# Patient Record
Sex: Male | Born: 1960 | Race: White | Hispanic: No | Marital: Married | Smoking: Never smoker
Health system: Southern US, Community
[De-identification: ages and names within clinical notes are randomized; demographics above are authoritative.]

## PROBLEM LIST (undated history)

## (undated) DIAGNOSIS — Z923 Personal history of irradiation: Secondary | ICD-10-CM

## (undated) DIAGNOSIS — C801 Malignant (primary) neoplasm, unspecified: Secondary | ICD-10-CM

## (undated) HISTORY — PX: COLONOSCOPY: SHX174

## (undated) HISTORY — PX: LIPOMA EXCISION: SHX5283

---

## 2018-12-15 DIAGNOSIS — M791 Myalgia, unspecified site: Secondary | ICD-10-CM | POA: Diagnosis not present

## 2018-12-15 DIAGNOSIS — M531 Cervicobrachial syndrome: Secondary | ICD-10-CM | POA: Diagnosis not present

## 2018-12-15 DIAGNOSIS — M9901 Segmental and somatic dysfunction of cervical region: Secondary | ICD-10-CM | POA: Diagnosis not present

## 2018-12-15 DIAGNOSIS — M9902 Segmental and somatic dysfunction of thoracic region: Secondary | ICD-10-CM | POA: Diagnosis not present

## 2019-02-01 DIAGNOSIS — M9901 Segmental and somatic dysfunction of cervical region: Secondary | ICD-10-CM | POA: Diagnosis not present

## 2019-02-01 DIAGNOSIS — M791 Myalgia, unspecified site: Secondary | ICD-10-CM | POA: Diagnosis not present

## 2019-02-01 DIAGNOSIS — M531 Cervicobrachial syndrome: Secondary | ICD-10-CM | POA: Diagnosis not present

## 2019-02-01 DIAGNOSIS — M9902 Segmental and somatic dysfunction of thoracic region: Secondary | ICD-10-CM | POA: Diagnosis not present

## 2019-06-21 DIAGNOSIS — M791 Myalgia, unspecified site: Secondary | ICD-10-CM | POA: Diagnosis not present

## 2019-06-21 DIAGNOSIS — M9902 Segmental and somatic dysfunction of thoracic region: Secondary | ICD-10-CM | POA: Diagnosis not present

## 2019-06-21 DIAGNOSIS — M531 Cervicobrachial syndrome: Secondary | ICD-10-CM | POA: Diagnosis not present

## 2019-06-21 DIAGNOSIS — M9901 Segmental and somatic dysfunction of cervical region: Secondary | ICD-10-CM | POA: Diagnosis not present

## 2019-07-18 DIAGNOSIS — M531 Cervicobrachial syndrome: Secondary | ICD-10-CM | POA: Diagnosis not present

## 2019-07-18 DIAGNOSIS — M9901 Segmental and somatic dysfunction of cervical region: Secondary | ICD-10-CM | POA: Diagnosis not present

## 2019-07-18 DIAGNOSIS — M9902 Segmental and somatic dysfunction of thoracic region: Secondary | ICD-10-CM | POA: Diagnosis not present

## 2019-07-18 DIAGNOSIS — M791 Myalgia, unspecified site: Secondary | ICD-10-CM | POA: Diagnosis not present

## 2019-08-09 DIAGNOSIS — M531 Cervicobrachial syndrome: Secondary | ICD-10-CM | POA: Diagnosis not present

## 2019-08-09 DIAGNOSIS — M9902 Segmental and somatic dysfunction of thoracic region: Secondary | ICD-10-CM | POA: Diagnosis not present

## 2019-08-09 DIAGNOSIS — M9901 Segmental and somatic dysfunction of cervical region: Secondary | ICD-10-CM | POA: Diagnosis not present

## 2019-08-09 DIAGNOSIS — M791 Myalgia, unspecified site: Secondary | ICD-10-CM | POA: Diagnosis not present

## 2019-12-25 DIAGNOSIS — Z0000 Encounter for general adult medical examination without abnormal findings: Secondary | ICD-10-CM | POA: Diagnosis not present

## 2019-12-25 DIAGNOSIS — E7800 Pure hypercholesterolemia, unspecified: Secondary | ICD-10-CM | POA: Diagnosis not present

## 2019-12-25 DIAGNOSIS — Z125 Encounter for screening for malignant neoplasm of prostate: Secondary | ICD-10-CM | POA: Diagnosis not present

## 2019-12-25 DIAGNOSIS — Z6839 Body mass index (BMI) 39.0-39.9, adult: Secondary | ICD-10-CM | POA: Diagnosis not present

## 2020-01-02 DIAGNOSIS — Z23 Encounter for immunization: Secondary | ICD-10-CM | POA: Diagnosis not present

## 2020-03-03 DIAGNOSIS — Z23 Encounter for immunization: Secondary | ICD-10-CM | POA: Diagnosis not present

## 2020-06-01 DIAGNOSIS — Z23 Encounter for immunization: Secondary | ICD-10-CM | POA: Diagnosis not present

## 2020-06-23 DIAGNOSIS — Z20822 Contact with and (suspected) exposure to covid-19: Secondary | ICD-10-CM | POA: Diagnosis not present

## 2020-07-11 DIAGNOSIS — M791 Myalgia, unspecified site: Secondary | ICD-10-CM | POA: Diagnosis not present

## 2020-07-11 DIAGNOSIS — M9901 Segmental and somatic dysfunction of cervical region: Secondary | ICD-10-CM | POA: Diagnosis not present

## 2020-07-11 DIAGNOSIS — M531 Cervicobrachial syndrome: Secondary | ICD-10-CM | POA: Diagnosis not present

## 2020-07-11 DIAGNOSIS — M9902 Segmental and somatic dysfunction of thoracic region: Secondary | ICD-10-CM | POA: Diagnosis not present

## 2020-12-18 DIAGNOSIS — M791 Myalgia, unspecified site: Secondary | ICD-10-CM | POA: Diagnosis not present

## 2020-12-18 DIAGNOSIS — M531 Cervicobrachial syndrome: Secondary | ICD-10-CM | POA: Diagnosis not present

## 2020-12-18 DIAGNOSIS — M9902 Segmental and somatic dysfunction of thoracic region: Secondary | ICD-10-CM | POA: Diagnosis not present

## 2020-12-18 DIAGNOSIS — M9901 Segmental and somatic dysfunction of cervical region: Secondary | ICD-10-CM | POA: Diagnosis not present

## 2020-12-26 DIAGNOSIS — M531 Cervicobrachial syndrome: Secondary | ICD-10-CM | POA: Diagnosis not present

## 2020-12-26 DIAGNOSIS — M791 Myalgia, unspecified site: Secondary | ICD-10-CM | POA: Diagnosis not present

## 2020-12-26 DIAGNOSIS — M9902 Segmental and somatic dysfunction of thoracic region: Secondary | ICD-10-CM | POA: Diagnosis not present

## 2020-12-26 DIAGNOSIS — M9901 Segmental and somatic dysfunction of cervical region: Secondary | ICD-10-CM | POA: Diagnosis not present

## 2021-02-04 DIAGNOSIS — M9902 Segmental and somatic dysfunction of thoracic region: Secondary | ICD-10-CM | POA: Diagnosis not present

## 2021-02-04 DIAGNOSIS — M531 Cervicobrachial syndrome: Secondary | ICD-10-CM | POA: Diagnosis not present

## 2021-02-04 DIAGNOSIS — M9901 Segmental and somatic dysfunction of cervical region: Secondary | ICD-10-CM | POA: Diagnosis not present

## 2021-02-04 DIAGNOSIS — M791 Myalgia, unspecified site: Secondary | ICD-10-CM | POA: Diagnosis not present

## 2021-02-11 DIAGNOSIS — M791 Myalgia, unspecified site: Secondary | ICD-10-CM | POA: Diagnosis not present

## 2021-02-11 DIAGNOSIS — M531 Cervicobrachial syndrome: Secondary | ICD-10-CM | POA: Diagnosis not present

## 2021-02-11 DIAGNOSIS — M9902 Segmental and somatic dysfunction of thoracic region: Secondary | ICD-10-CM | POA: Diagnosis not present

## 2021-02-11 DIAGNOSIS — M9901 Segmental and somatic dysfunction of cervical region: Secondary | ICD-10-CM | POA: Diagnosis not present

## 2021-04-09 DIAGNOSIS — M791 Myalgia, unspecified site: Secondary | ICD-10-CM | POA: Diagnosis not present

## 2021-04-09 DIAGNOSIS — M9902 Segmental and somatic dysfunction of thoracic region: Secondary | ICD-10-CM | POA: Diagnosis not present

## 2021-04-09 DIAGNOSIS — M531 Cervicobrachial syndrome: Secondary | ICD-10-CM | POA: Diagnosis not present

## 2021-04-09 DIAGNOSIS — M9901 Segmental and somatic dysfunction of cervical region: Secondary | ICD-10-CM | POA: Diagnosis not present

## 2021-06-01 DIAGNOSIS — M531 Cervicobrachial syndrome: Secondary | ICD-10-CM | POA: Diagnosis not present

## 2021-06-01 DIAGNOSIS — M791 Myalgia, unspecified site: Secondary | ICD-10-CM | POA: Diagnosis not present

## 2021-06-01 DIAGNOSIS — M9902 Segmental and somatic dysfunction of thoracic region: Secondary | ICD-10-CM | POA: Diagnosis not present

## 2021-06-01 DIAGNOSIS — M9901 Segmental and somatic dysfunction of cervical region: Secondary | ICD-10-CM | POA: Diagnosis not present

## 2021-06-23 DIAGNOSIS — M791 Myalgia, unspecified site: Secondary | ICD-10-CM | POA: Diagnosis not present

## 2021-06-23 DIAGNOSIS — M9901 Segmental and somatic dysfunction of cervical region: Secondary | ICD-10-CM | POA: Diagnosis not present

## 2021-06-23 DIAGNOSIS — M9902 Segmental and somatic dysfunction of thoracic region: Secondary | ICD-10-CM | POA: Diagnosis not present

## 2021-06-23 DIAGNOSIS — M531 Cervicobrachial syndrome: Secondary | ICD-10-CM | POA: Diagnosis not present

## 2021-07-14 DIAGNOSIS — M791 Myalgia, unspecified site: Secondary | ICD-10-CM | POA: Diagnosis not present

## 2021-07-14 DIAGNOSIS — M9901 Segmental and somatic dysfunction of cervical region: Secondary | ICD-10-CM | POA: Diagnosis not present

## 2021-07-14 DIAGNOSIS — M531 Cervicobrachial syndrome: Secondary | ICD-10-CM | POA: Diagnosis not present

## 2021-07-14 DIAGNOSIS — M9902 Segmental and somatic dysfunction of thoracic region: Secondary | ICD-10-CM | POA: Diagnosis not present

## 2022-01-17 ENCOUNTER — Other Ambulatory Visit (HOSPITAL_COMMUNITY): Payer: Self-pay | Admitting: Family Medicine

## 2022-01-17 DIAGNOSIS — R1319 Other dysphagia: Secondary | ICD-10-CM

## 2022-01-27 ENCOUNTER — Ambulatory Visit (HOSPITAL_COMMUNITY)
Admission: RE | Admit: 2022-01-27 | Discharge: 2022-01-27 | Disposition: A | Discharge status: Home / Self Care | Payer: No Typology Code available for payment source | Source: Ambulatory Visit | Attending: Family Medicine | Admitting: Family Medicine

## 2022-01-27 DIAGNOSIS — R1319 Other dysphagia: Secondary | ICD-10-CM | POA: Diagnosis present

## 2022-02-02 ENCOUNTER — Other Ambulatory Visit (HOSPITAL_COMMUNITY): Payer: Self-pay | Admitting: Family Medicine

## 2022-02-02 ENCOUNTER — Other Ambulatory Visit: Payer: Self-pay | Admitting: Cardiology

## 2022-02-02 ENCOUNTER — Other Ambulatory Visit: Payer: Self-pay | Admitting: Family Medicine

## 2022-02-02 DIAGNOSIS — E0789 Other specified disorders of thyroid: Secondary | ICD-10-CM

## 2022-02-09 ENCOUNTER — Ambulatory Visit (HOSPITAL_COMMUNITY)
Admission: RE | Admit: 2022-02-09 | Discharge: 2022-02-09 | Disposition: A | Discharge status: Home / Self Care | Payer: No Typology Code available for payment source | Source: Ambulatory Visit | Attending: Family Medicine | Admitting: Family Medicine

## 2022-02-09 DIAGNOSIS — E0789 Other specified disorders of thyroid: Secondary | ICD-10-CM | POA: Diagnosis present

## 2022-02-10 ENCOUNTER — Telehealth (INDEPENDENT_AMBULATORY_CARE_PROVIDER_SITE_OTHER): Payer: Self-pay

## 2022-02-10 ENCOUNTER — Encounter (INDEPENDENT_AMBULATORY_CARE_PROVIDER_SITE_OTHER): Payer: Self-pay | Admitting: Gastroenterology

## 2022-02-10 ENCOUNTER — Encounter (INDEPENDENT_AMBULATORY_CARE_PROVIDER_SITE_OTHER): Payer: Self-pay

## 2022-02-10 ENCOUNTER — Ambulatory Visit (INDEPENDENT_AMBULATORY_CARE_PROVIDER_SITE_OTHER): Payer: No Typology Code available for payment source | Admitting: Gastroenterology

## 2022-02-10 ENCOUNTER — Other Ambulatory Visit (INDEPENDENT_AMBULATORY_CARE_PROVIDER_SITE_OTHER): Payer: Self-pay

## 2022-02-10 DIAGNOSIS — Z860100 Personal history of colon polyps, unspecified: Secondary | ICD-10-CM

## 2022-02-10 DIAGNOSIS — R1319 Other dysphagia: Secondary | ICD-10-CM | POA: Diagnosis not present

## 2022-02-10 DIAGNOSIS — R1310 Dysphagia, unspecified: Secondary | ICD-10-CM | POA: Insufficient documentation

## 2022-02-10 MED ORDER — PEG 3350-KCL-NA BICARB-NACL 420 G PO SOLR: 4000.0000 mL | ORAL | 0 refills | Status: DC

## 2022-02-10 NOTE — Patient Instructions (Signed)
Schedule EGD and colonoscopy

## 2022-02-10 NOTE — Telephone Encounter (Signed)
[name] [name], CMA  ?

## 2022-02-10 NOTE — Progress Notes (Signed)
Maylon Peppers, M.D. ?Gastroenterology & Hepatology ?[facility] ?[address] ?[place], [place] [address] ?Primary Care Physician: ?Masneri, Adele Barthel, DO ?[address] ?[place] [address] ? ?Referring MD: PCP ? ?Chief Complaint:  dysphagia ? ?History of Present Illness: ?[name] [name] is a 61 y.o. male with no past medical history, who presents for evaluation of dysphagia. ? ?States that for the last 10 weeks he has presented persistent and recurrent episodes of dysphagia to solids. States it happens more often with bread products. States that he has presented some pain in the upper thoracic midline area when he is swallowing which improves after he drinks water. He has had to vomit 4 times the food as the pain was significant, which actually immediately resolved the symptoms. No heartburn. ? ?The patient denies having any nausea, fever, chills, hematochezia, melena, hematemesis, abdominal distention, abdominal pain, diarrhea, jaundice, pruritus . Has lost 40 lb in last few months, which has been on purpose as he has been walking more frequently. ? 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  There was indentation of the intragastric area at the GE junction concerning for mass versus prominence of the GE junction.  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  Patient endoscopic submucosal dissection underwent thyroid ultrasound yesterday which showed a total of 6 nodules, 1 of which was recommended to have FNA for further evaluation. ? ?Last IWP:YKDXI ?Last Colonoscopy:3 years ago (performed at [place]), had one polyp removed at that time but no reports are available. Is due for a repeat colonoscopy per recs. ? ?FHx: neg for any gastrointestinal/liver disease, grandmother breast  cancer ?Social: neg smoking or illicit drug use. Has one bourbon or glass of wine a day. ?Surgical: no abdominal surgeries ? ?Past Medical History:History reviewed. No pertinent past medical history. ? ?Past Surgical History:History reviewed. No pertinent surgical history. ? ?Family History:History reviewed. No pertinent family history. ? ?Social History: ?Social History  ? ?Tobacco Use  ?Smoking Status Never  ?Smokeless Tobacco Never  ? ?Social History  ? ?Substance and Sexual Activity  ?Alcohol Use Yes  ? Comment: 1 daily  ? ?Social History  ? ?Substance and Sexual Activity  ?Drug Use Never  ? ? ?Allergies: ?No Known Allergies ? ?Medications: ?No current outpatient medications on file.  ? ?No current facility-administered medications for this visit.  ? ? ?Review of Systems: ?GENERAL: negative for malaise, night sweats ?HEENT: No changes in hearing or vision, no nose bleeds or other nasal problems. ?NECK: Negative for lumps, goiter, pain and significant neck swelling ?RESPIRATORY: Negative for cough, wheezing ?CARDIOVASCULAR: Negative for chest pain, leg swelling, palpitations, orthopnea ?GI: SEE HPI ?MUSCULOSKELETAL: Negative for joint pain or swelling, back pain, and muscle pain. ?SKIN: Negative for lesions, rash ?PSYCH: Negative for sleep disturbance, mood disorder and recent psychosocial stressors. ?HEMATOLOGY Negative for prolonged bleeding, bruising easily, and swollen nodes. ?ENDOCRINE: Negative for cold or heat intolerance, polyuria, polydipsia and goiter. ?NEURO: negative for tremor, gait imbalance, syncope and seizures. ?The remainder of the review of systems is noncontributory. ? ? ?Physical Exam: ?BP 115/76 (BP Location: Left Arm, Patient Position: Sitting, Cuff Size: Large)   Pulse 69   Temp 97.9 ?F (36.6 ?C) (Oral)   Ht '5\' 10"'$  (1.778 m)   Wt 238 lb 12.8  oz (108.3 kg)   BMI 34.26 kg/m?  ?GENERAL: The patient is AO x3, in no acute distress. ?HEENT: Head is normocephalic and atraumatic. EOMI are  intact. Mouth is well hydrated and without lesions. ?NECK: Supple. No masses ?LUNGS: Clear to auscultation. No presence of rhonchi/wheezing/rales. Adequate chest expansion ?HEART: RRR, normal s1 and s2. ?ABDOMEN: Soft, nontender, no guarding, no peritoneal signs, and nondistended. BS +. No masses. ?EXTREMITIES: Without any cyanosis, clubbing, rash, lesions or edema. ?NEUROLOGIC: AOx3, no focal motor deficit. ?SKIN: no jaundice, no rashes ? ? ?Imaging/Labs: ?as above ? ?I personally reviewed and interpreted the available labs, imaging and endoscopic files. ? ?Impression and Plan: ?[name] [name] is a 61 y.o. male with no past medical history, who presents for evaluation of dysphagia.  Patient has presented with symptoms while eating solids without presence of red flag signs.  However, he underwent recent esophagram that showed possible stricture at the GE junction and indentation t the GE junction.  We will need to further explore this with a evaluation depending on findings.  He is also due for colonoscopy as he has a history of colonic polyps which will be scheduled on the same day. ? ?- Schedule EGD and colonoscopy ? ?All questions were answered.     ? ?Maylon Peppers, MD ?Gastroenterology and Hepatology ?[facility] ? ?

## 2022-02-11 ENCOUNTER — Other Ambulatory Visit (HOSPITAL_COMMUNITY): Payer: Self-pay | Admitting: Family Medicine

## 2022-02-11 ENCOUNTER — Other Ambulatory Visit: Payer: Self-pay | Admitting: Family Medicine

## 2022-02-11 ENCOUNTER — Encounter (INDEPENDENT_AMBULATORY_CARE_PROVIDER_SITE_OTHER): Payer: Self-pay

## 2022-02-11 DIAGNOSIS — E041 Nontoxic single thyroid nodule: Secondary | ICD-10-CM

## 2022-02-17 ENCOUNTER — Encounter (HOSPITAL_COMMUNITY): Payer: Self-pay

## 2022-02-17 ENCOUNTER — Ambulatory Visit (HOSPITAL_COMMUNITY)
Admission: RE | Admit: 2022-02-17 | Discharge: 2022-02-17 | Disposition: A | Discharge status: Home / Self Care | Payer: No Typology Code available for payment source | Source: Ambulatory Visit | Attending: Family Medicine | Admitting: Family Medicine

## 2022-02-17 DIAGNOSIS — E041 Nontoxic single thyroid nodule: Secondary | ICD-10-CM | POA: Diagnosis not present

## 2022-02-17 MED ORDER — LIDOCAINE HCL (PF) 2 % IJ SOLN
INTRAMUSCULAR | Status: AC
  Administered 2022-02-17: 10 mL
  Filled 2022-02-17: qty 10

## 2022-02-17 MED ORDER — LIDOCAINE HCL (PF) 2 % IJ SOLN
INTRAMUSCULAR | Status: AC
  Filled 2022-02-17: qty 10

## 2022-02-17 NOTE — Progress Notes (Signed)
PT tolerated thyroid biopsy procedure well today. Labs and afirma obtained and sent for pathology. PT ambulatory at discharge with no acute distress noted and verbalized understanding of discharge instructions. 

## 2022-02-18 LAB — CYTOLOGY - NON PAP

## 2022-03-23 ENCOUNTER — Other Ambulatory Visit: Payer: Self-pay

## 2022-03-23 ENCOUNTER — Encounter (HOSPITAL_COMMUNITY)
Admission: RE | Admit: 2022-03-23 | Discharge: 2022-03-23 | Disposition: A | Discharge status: Home / Self Care | Payer: No Typology Code available for payment source | Source: Ambulatory Visit | Attending: Gastroenterology | Admitting: Gastroenterology

## 2022-03-23 ENCOUNTER — Encounter (HOSPITAL_COMMUNITY): Payer: Self-pay

## 2022-03-25 ENCOUNTER — Encounter (HOSPITAL_COMMUNITY): Payer: Self-pay | Admitting: Gastroenterology

## 2022-03-25 ENCOUNTER — Ambulatory Visit (HOSPITAL_COMMUNITY)
Admission: RE | Admit: 2022-03-25 | Discharge: 2022-03-25 | Disposition: A | Discharge status: Home / Self Care | Payer: No Typology Code available for payment source | Attending: Gastroenterology | Admitting: Gastroenterology

## 2022-03-25 ENCOUNTER — Ambulatory Visit (HOSPITAL_BASED_OUTPATIENT_CLINIC_OR_DEPARTMENT_OTHER): Payer: No Typology Code available for payment source | Admitting: Anesthesiology

## 2022-03-25 ENCOUNTER — Ambulatory Visit (HOSPITAL_COMMUNITY): Payer: No Typology Code available for payment source | Admitting: Anesthesiology

## 2022-03-25 ENCOUNTER — Encounter (HOSPITAL_COMMUNITY)
Admission: RE | Disposition: A | Discharge status: Home / Self Care | Payer: Self-pay | Source: Home / Self Care | Attending: Gastroenterology

## 2022-03-25 ENCOUNTER — Encounter (INDEPENDENT_AMBULATORY_CARE_PROVIDER_SITE_OTHER): Payer: Self-pay | Admitting: *Deleted

## 2022-03-25 DIAGNOSIS — Z8719 Personal history of other diseases of the digestive system: Secondary | ICD-10-CM | POA: Diagnosis not present

## 2022-03-25 DIAGNOSIS — K635 Polyp of colon: Secondary | ICD-10-CM | POA: Diagnosis not present

## 2022-03-25 DIAGNOSIS — Z1211 Encounter for screening for malignant neoplasm of colon: Secondary | ICD-10-CM | POA: Diagnosis not present

## 2022-03-25 DIAGNOSIS — D890 Polyclonal hypergammaglobulinemia: Secondary | ICD-10-CM

## 2022-03-25 DIAGNOSIS — D123 Benign neoplasm of transverse colon: Secondary | ICD-10-CM | POA: Insufficient documentation

## 2022-03-25 DIAGNOSIS — C169 Malignant neoplasm of stomach, unspecified: Secondary | ICD-10-CM | POA: Diagnosis not present

## 2022-03-25 DIAGNOSIS — Z86010 Personal history of colonic polyps: Secondary | ICD-10-CM

## 2022-03-25 DIAGNOSIS — R1310 Dysphagia, unspecified: Secondary | ICD-10-CM | POA: Insufficient documentation

## 2022-03-25 HISTORY — PX: COLONOSCOPY WITH PROPOFOL: SHX5780

## 2022-03-25 HISTORY — PX: ESOPHAGOGASTRODUODENOSCOPY (EGD) WITH PROPOFOL: SHX5813

## 2022-03-25 HISTORY — PX: BIOPSY: SHX5522

## 2022-03-25 LAB — HM COLONOSCOPY

## 2022-03-25 SURGERY — COLONOSCOPY WITH PROPOFOL
Anesthesia: General

## 2022-03-25 MED ORDER — LACTATED RINGERS IV SOLN: INTRAVENOUS | Status: DC

## 2022-03-25 MED ORDER — PROPOFOL 10 MG/ML IV BOLUS
INTRAVENOUS | Status: DC | PRN
  Administered 2022-03-25: 30 mg via INTRAVENOUS
  Administered 2022-03-25: 40 mg via INTRAVENOUS
  Administered 2022-03-25: 50 mg via INTRAVENOUS
  Administered 2022-03-25: 20 mg via INTRAVENOUS

## 2022-03-25 MED ORDER — OMEPRAZOLE 40 MG PO CPDR: 40.0000 mg | DELAYED_RELEASE_CAPSULE | Freq: Two times a day (BID) | ORAL | 3 refills | Status: DC

## 2022-03-25 MED ORDER — LIDOCAINE HCL (CARDIAC) PF 100 MG/5ML IV SOSY
PREFILLED_SYRINGE | INTRAVENOUS | Status: DC | PRN
  Administered 2022-03-25: 100 mg via INTRATRACHEAL

## 2022-03-25 MED ORDER — PROPOFOL 500 MG/50ML IV EMUL
INTRAVENOUS | Status: DC | PRN
  Administered 2022-03-25: 150 ug/kg/min via INTRAVENOUS
  Administered 2022-03-25: 125 ug/kg/min via INTRAVENOUS

## 2022-03-25 NOTE — Anesthesia Preprocedure Evaluation (Signed)
Anesthesia Evaluation  Patient identified by MRN, date of birth, ID band Patient awake    Reviewed: Allergy & Precautions, H&P , NPO status , Patient's Chart, lab work & pertinent test results, reviewed documented beta blocker date and time   Airway Mallampati: II  TM Distance: >3 FB Neck ROM: full    Dental no notable dental hx.    Pulmonary neg pulmonary ROS,    Pulmonary exam normal breath sounds clear to auscultation       Cardiovascular Exercise Tolerance: Good negative cardio ROS   Rhythm:regular Rate:Normal     Neuro/Psych negative neurological ROS  negative psych ROS   GI/Hepatic negative GI ROS, Neg liver ROS,   Endo/Other  negative endocrine ROS  Renal/GU negative Renal ROS  negative genitourinary   Musculoskeletal   Abdominal   Peds  Hematology negative hematology ROS (+)   Anesthesia Other Findings   Reproductive/Obstetrics negative OB ROS                             Anesthesia Physical Anesthesia Plan  ASA: 1  Anesthesia Plan: General   Post-op Pain Management:    Induction:   PONV Risk Score and Plan: Propofol infusion  Airway Management Planned:   Additional Equipment:   Intra-op Plan:   Post-operative Plan:   Informed Consent: I have reviewed the patients History and Physical, chart, labs and discussed the procedure including the risks, benefits and alternatives for the proposed anesthesia with the patient or authorized representative who has indicated his/her understanding and acceptance.     Dental Advisory Given  Plan Discussed with: CRNA  Anesthesia Plan Comments:         Anesthesia Quick Evaluation  

## 2022-03-25 NOTE — Transfer of Care (Signed)
Immediate Anesthesia Transfer of Care Note  Patient: [name] [name]  Procedure(s) Performed: COLONOSCOPY WITH PROPOFOL ESOPHAGOGASTRODUODENOSCOPY (EGD) WITH PROPOFOL BIOPSY  Patient Location: Short Stay  Anesthesia Type:General  Level of Consciousness: sedated  Airway & Oxygen Therapy: Patient Spontanous Breathing  Post-op Assessment: Report given to RN and Post -op Vital signs reviewed and stable  Post vital signs: Reviewed and stable  Last Vitals:  Vitals Value Taken Time  BP 93/55 03/25/22 1052  Temp 36.4 C 03/25/22 1052  Pulse 60 03/25/22 1052  Resp 14 03/25/22 1052  SpO2 98 % 03/25/22 1052    Last Pain:  Vitals:   03/25/22 1052  TempSrc: Axillary  PainSc:          Complications: No notable events documented.

## 2022-03-25 NOTE — Op Note (Addendum)
[facility] Patient Name: [name] [name] Procedure Date: 03/25/2022 9:38 AM MRN: [id number] Date of Birth: 13-Oct-1961 Attending MD: Maylon Peppers ,  CSN: [id number] Age: 61 Admit Type: Outpatient Procedure:                Upper GI endoscopy Indications:              Dysphagia Providers:                Maylon Peppers, Lurline Del, RN, Casimer Bilis, Technician Referring MD:              Medicines:                Monitored Anesthesia Care Complications:            No immediate complications. Estimated Blood Loss:     Estimated blood loss: none. Procedure:                Pre-Anesthesia Assessment:                           - Prior to the procedure, a History and Physical                            was performed, and patient medications, allergies                            and sensitivities were reviewed. The patient's                            tolerance of previous anesthesia was reviewed.                           - The risks and benefits of the procedure and the                            sedation options and risks were discussed with the                            patient. All questions were answered and informed                            consent was obtained.                           - ASA Grade Assessment: I - A normal, healthy                            patient.                           After obtaining informed consent, the endoscope was                            passed under direct vision. Throughout the  procedure, the patient's blood pressure, pulse, and                            oxygen saturations were monitored continuously. The                            [id number]-H190 ([id number]) scope was introduced through the                            mouth, and advanced to the second part of duodenum.                            The upper GI endoscopy was accomplished without                            difficulty. The  patient tolerated the procedure                            well. Scope In: 9:54:21 AM Scope Out: 10:08:37 AM Total Procedure Duration: 0 hours 14 minutes 16 seconds  Findings:      A large, ulcerating mass with no bleeding and stigmata of recent       bleeding was found at the gastroesophageal junction, 40 to 42 cm from       the incisors. The mass appeared to extend 1 cm distal towards the       cardia. The mass was partially obstructing and circumferential. This was       biopsied multiple times with a cold large-capacity forceps for histology.      The entire examined stomach was normal.      The examined duodenum was normal. 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 Biopsied.                           - Normal stomach.                           - Normal examined duodenum. Moderate Sedation:      Per Anesthesia Care Recommendation:           - Discharge patient to home (ambulatory).                           - Resume previous diet, but need to grind meat and                            will need to chew food thoroughly.                           - Use Prilosec (omeprazole) 40 mg  PO daily.                           - Await pathology results.                           - Obtain CT chest/abdomen/pelvis with IV contrast. Procedure Code(s):        --- Professional ---                           [phone], Esophagogastroduodenoscopy, flexible,                            transoral; with biopsy, single or multiple Diagnosis Code(s):        --- Professional ---                           D49.0, Neoplasm of unspecified behavior of                            digestive system                           R13.10, Dysphagia, unspecified CPT copyright 2019 American Medical Association. All rights reserved. The codes documented in this report are preliminary and upon  coder review may  be revised to meet current compliance requirements. Maylon Peppers, MD Maylon Peppers,  03/25/2022 10:16:07 AM This report has been signed electronically. Number of Addenda: 0

## 2022-03-25 NOTE — Discharge Instructions (Addendum)
You are being discharged to home.  Resume your previous diet, but need to grind meat and will need to chew food thoroughly. Take Prilosec (omeprazole) 40 mg by mouth once a day.  We are waiting for your pathology results.  Obtain CT chest/abdomen/pelvis with IV contrast. Your physician has recommended a repeat colonoscopy in five years for surveillance.

## 2022-03-25 NOTE — Op Note (Signed)
[facility] Patient Name: [name] [name] Procedure Date: 03/25/2022 9:37 AM MRN: [id number] Date of Birth: June 29, 1961 Attending MD: Maylon Peppers ,  CSN: [id number] Age: 61 Admit Type: Outpatient Procedure:                Colonoscopy Indications:              Surveillance: Personal history of colonic polyps                            (unknown histology) on last colonoscopy 3 years ago Providers:                Maylon Peppers, Lurline Del, RN, Casimer Bilis, Technician Referring MD:              Medicines:                Monitored Anesthesia Care Complications:            No immediate complications. Estimated Blood Loss:     Estimated blood loss: none. Procedure:                Pre-Anesthesia Assessment:                           - Prior to the procedure, a History and Physical                            was performed, and patient medications, allergies                            and sensitivities were reviewed. The patient's                            tolerance of previous anesthesia was reviewed.                           - The risks and benefits of the procedure and the                            sedation options and risks were discussed with the                            patient. All questions were answered and informed                            consent was obtained.                           - ASA Grade Assessment: I - A normal, healthy                            patient.                           After obtaining informed consent, the colonoscope  was passed under direct vision. Throughout the                            procedure, the patient's blood pressure, pulse, and                            oxygen saturations were monitored continuously. The                            PCF-HQ190L ([id number]) was introduced through the                            anus and advanced to the the cecum, identified by                             appendiceal orifice and ileocecal valve. The                            colonoscopy was performed without difficulty. The                            patient tolerated the procedure well. The quality                            of the bowel preparation was adequate. Scope In: 10:17:14 AM Scope Out: 10:44:09 AM Scope Withdrawal Time: 0 hours 22 minutes 19 seconds  Total Procedure Duration: 0 hours 26 minutes 55 seconds  Findings:      The perianal and digital rectal examinations were normal.      A 5 mm polyp was found in the transverse colon. The polyp was sessile.       The polyp was removed with a cold snare. Resection and retrieval were       complete.      The retroflexed view of the distal rectum and anal verge was normal and       showed no anal or rectal abnormalities. Impression:               - One 5 mm polyp in the transverse colon, removed                            with a cold snare. Resected and retrieved.                           - The distal rectum and anal verge are normal on                            retroflexion view. Moderate Sedation:      Per Anesthesia Care Recommendation:           - Discharge patient to home (ambulatory).                           - Resume previous diet.                           -  Await pathology results.                           - Repeat colonoscopy in 5 years for surveillance. Procedure Code(s):        --- Professional ---                           [phone], Colonoscopy, flexible; with removal of                            tumor(s), polyp(s), or other lesion(s) by snare                            technique Diagnosis Code(s):        --- Professional ---                           Z86.010, Personal history of colonic polyps                           K63.5, Polyp of colon CPT copyright 2019 American Medical Association. All rights reserved. The codes documented in this report are preliminary and upon coder review may  be revised to  meet current compliance requirements. Maylon Peppers, MD Maylon Peppers,  03/25/2022 10:52:16 AM This report has been signed electronically. Number of Addenda: 0

## 2022-03-25 NOTE — H&P (Signed)
[name] [name] is an 61 y.o. male.   Chief Complaint:  dysphagia and h/o colon polyps HPI: [name] [name] is a 61 y.o. male with no past medical history, who presents for evaluation of dysphagia and history of colon polyps.  Patient reports that he has presented persistent dysphagia since late February 2023.  States that his dysphagia is related to solids.  Has not lost any weight recently unintentionally.  No nausea, vomiting, fever, chills.  Last Colonoscopy:3 years ago (performed at [place]), had one polyp removed at that time but no reports are available. Is due for a repeat colonoscopy per recs.  History reviewed. No pertinent past medical history.  Past Surgical History:  Procedure Laterality Date   COLONOSCOPY      History reviewed. No pertinent family history. Social History:  reports that he has never smoked. He has never used smokeless tobacco. He reports current alcohol use. He reports that he does not use drugs.  Allergies: No Known Allergies  Medications Prior to Admission  Medication Sig Dispense Refill   polyethylene glycol-electrolytes (TRILYTE) 420 g solution Take 4,000 mLs by mouth as directed. 4000 mL 0    No results found for this or any previous visit (from the past 48 hour(s)). No results found.  Review of Systems  Constitutional: Negative.   HENT:  Positive for trouble swallowing.   Eyes: Negative.   Respiratory: Negative.    Cardiovascular: Negative.   Gastrointestinal: Negative.   Endocrine: Negative.   Genitourinary: Negative.   Musculoskeletal: Negative.   Skin: Negative.   Allergic/Immunologic: Negative.   Neurological: Negative.   Hematological: Negative.   Psychiatric/Behavioral: Negative.      Blood pressure 133/79, pulse 64, temperature 97.9 F (36.6 C), resp. rate 15, SpO2 96 %. Physical Exam  GENERAL: The patient is AO x3, in no acute distress. HEENT: Head is normocephalic and atraumatic. EOMI are intact. Mouth is well hydrated and  without lesions. NECK: Supple. No masses LUNGS: Clear to auscultation. No presence of rhonchi/wheezing/rales. Adequate chest expansion HEART: RRR, normal s1 and s2. ABDOMEN: Soft, nontender, no guarding, no peritoneal signs, and nondistended. BS +. No masses. EXTREMITIES: Without any cyanosis, clubbing, rash, lesions or edema. NEUROLOGIC: AOx3, no focal motor deficit. SKIN: no jaundice, no rashes  Assessment/Plan [name] is a 61 y.o. male with no past medical history, who presents for evaluation of dysphagia and history of colon polyps.  We will proceed with EGD and colonoscopy.  Harvel Quale, MD 03/25/2022, 9:14 AM

## 2022-03-25 NOTE — Anesthesia Postprocedure Evaluation (Signed)
Anesthesia Post Note  Patient: [name] [name]  Procedure(s) Performed: COLONOSCOPY WITH PROPOFOL ESOPHAGOGASTRODUODENOSCOPY (EGD) WITH PROPOFOL BIOPSY  Patient location during evaluation: Phase II Anesthesia Type: General Level of consciousness: awake Pain management: pain level controlled Vital Signs Assessment: post-procedure vital signs reviewed and stable Respiratory status: spontaneous breathing and respiratory function stable Cardiovascular status: blood pressure returned to baseline and stable Postop Assessment: no headache and no apparent nausea or vomiting Anesthetic complications: no Comments: Late entry   No notable events documented.   Last Vitals:  Vitals:   03/25/22 0832 03/25/22 1052  BP: 133/79 (!) 93/55  Pulse: 64 60  Resp: 15 14  Temp: 36.6 C (!) 36.4 C  SpO2: 96% 98%    Last Pain:  Vitals:   03/25/22 1056  TempSrc:   PainSc: 0-No pain                 Louann Sjogren

## 2022-03-28 ENCOUNTER — Other Ambulatory Visit (INDEPENDENT_AMBULATORY_CARE_PROVIDER_SITE_OTHER): Payer: Self-pay

## 2022-03-28 ENCOUNTER — Telehealth (INDEPENDENT_AMBULATORY_CARE_PROVIDER_SITE_OTHER): Payer: Self-pay

## 2022-03-28 DIAGNOSIS — K2289 Other specified disease of esophagus: Secondary | ICD-10-CM

## 2022-03-28 DIAGNOSIS — Z01812 Encounter for preprocedural laboratory examination: Secondary | ICD-10-CM

## 2022-03-28 LAB — SURGICAL PATHOLOGY

## 2022-03-28 NOTE — Telephone Encounter (Signed)
Ct Chest/Abd/Pel w/cm is scheduled on 03/30/22 and Mr Felter is aware

## 2022-03-28 NOTE — Telephone Encounter (Signed)
-----   Message from Harvel Quale, MD sent at 03/25/2022 10:15 AM EDT ----- [name] [name],   Can you please schedule a CT chest/abdomen/pelvis ASAP? Dx: esophageal mass, rule out cancer.   Thanks,  Maylon Peppers, MD Gastroenterology and Hepatology [facility]

## 2022-03-28 NOTE — Telephone Encounter (Signed)
Thanks

## 2022-03-29 ENCOUNTER — Ambulatory Visit (HOSPITAL_BASED_OUTPATIENT_CLINIC_OR_DEPARTMENT_OTHER): Payer: No Typology Code available for payment source

## 2022-03-30 ENCOUNTER — Other Ambulatory Visit (HOSPITAL_COMMUNITY): Payer: Self-pay

## 2022-03-30 ENCOUNTER — Ambulatory Visit (HOSPITAL_BASED_OUTPATIENT_CLINIC_OR_DEPARTMENT_OTHER)
Admission: RE | Admit: 2022-03-30 | Discharge: 2022-03-30 | Disposition: A | Discharge status: Home / Self Care | Payer: No Typology Code available for payment source | Source: Ambulatory Visit | Attending: Gastroenterology | Admitting: Gastroenterology

## 2022-03-30 ENCOUNTER — Encounter (HOSPITAL_BASED_OUTPATIENT_CLINIC_OR_DEPARTMENT_OTHER): Payer: Self-pay

## 2022-03-30 DIAGNOSIS — K760 Fatty (change of) liver, not elsewhere classified: Secondary | ICD-10-CM | POA: Diagnosis not present

## 2022-03-30 DIAGNOSIS — I700 Atherosclerosis of aorta: Secondary | ICD-10-CM | POA: Diagnosis not present

## 2022-03-30 DIAGNOSIS — M47894 Other spondylosis, thoracic region: Secondary | ICD-10-CM | POA: Diagnosis not present

## 2022-03-30 DIAGNOSIS — R911 Solitary pulmonary nodule: Secondary | ICD-10-CM | POA: Insufficient documentation

## 2022-03-30 DIAGNOSIS — C160 Malignant neoplasm of cardia: Secondary | ICD-10-CM

## 2022-03-30 DIAGNOSIS — K429 Umbilical hernia without obstruction or gangrene: Secondary | ICD-10-CM | POA: Insufficient documentation

## 2022-03-30 DIAGNOSIS — N400 Enlarged prostate without lower urinary tract symptoms: Secondary | ICD-10-CM | POA: Insufficient documentation

## 2022-03-30 DIAGNOSIS — K2289 Other specified disease of esophagus: Secondary | ICD-10-CM | POA: Insufficient documentation

## 2022-03-30 MED ORDER — IOHEXOL 300 MG/ML  SOLN
100.0000 mL | Freq: Once | INTRAMUSCULAR | Status: AC | PRN
  Administered 2022-03-30: 100 mL via INTRAVENOUS

## 2022-03-30 NOTE — Progress Notes (Signed)
Order for PET scan placed per Dr. Katragadda. 

## 2022-04-01 ENCOUNTER — Encounter (HOSPITAL_COMMUNITY): Payer: Self-pay | Admitting: Gastroenterology

## 2022-04-07 ENCOUNTER — Ambulatory Visit (HOSPITAL_COMMUNITY)
Admission: RE | Admit: 2022-04-07 | Discharge: 2022-04-07 | Disposition: A | Discharge status: Home / Self Care | Payer: No Typology Code available for payment source | Source: Ambulatory Visit | Attending: Hematology | Admitting: Hematology

## 2022-04-07 DIAGNOSIS — C160 Malignant neoplasm of cardia: Secondary | ICD-10-CM | POA: Diagnosis present

## 2022-04-07 MED ORDER — FLUDEOXYGLUCOSE F - 18 (FDG) INJECTION
10.8700 | Freq: Once | INTRAVENOUS | Status: AC | PRN
  Administered 2022-04-07: 10.87 via INTRAVENOUS

## 2022-04-12 ENCOUNTER — Telehealth: Payer: Self-pay | Admitting: Gastroenterology

## 2022-04-12 ENCOUNTER — Inpatient Hospital Stay (HOSPITAL_COMMUNITY): Payer: No Typology Code available for payment source | Attending: Hematology | Admitting: Hematology

## 2022-04-12 DIAGNOSIS — E041 Nontoxic single thyroid nodule: Secondary | ICD-10-CM | POA: Diagnosis not present

## 2022-04-12 DIAGNOSIS — Z803 Family history of malignant neoplasm of breast: Secondary | ICD-10-CM | POA: Diagnosis not present

## 2022-04-12 DIAGNOSIS — C160 Malignant neoplasm of cardia: Secondary | ICD-10-CM | POA: Insufficient documentation

## 2022-04-12 NOTE — Progress Notes (Signed)
[facility] 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 61 y.o. male is here because of evaluation for GE junction carcinoma, at the request of Dr. Levon Hedger.  He was having dysphagia symptoms since beginning of the year and was evaluated by Dr. Levon Hedger with EGD which showed GE junction mass.  Today he reports feeling good. He reports pain between his shoulder blades swallowing solid foods such as breads and broccoli since February. He reports he is intentionally losing weight through exercise; he walks 7 miles daily. He has lost 60 lbs since September 2022. He denies vomiting. His appetite is good, and he denies black stools and hematochezia. He denies history of acid reflux and Barrett's esophagus.   He currently lives at home with his wife. 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 He denies smoking history. He reports he drinks alcohol 5 days out of the week. His maternal grandmother had breast cancer, and his maternal cousin had metastatic breast cancer.   MEDICAL HISTORY:  No past medical history on file.  SURGICAL HISTORY: Past Surgical History:  Procedure Laterality Date   BIOPSY  03/25/2022   Procedure: BIOPSY;  Surgeon: Marguerita Merles, Reuel Boom, MD;  [facility]: [facility];  Service: Gastroenterology;;  GE junction;   COLONOSCOPY     COLONOSCOPY WITH PROPOFOL N/A 03/25/2022   Procedure: COLONOSCOPY WITH PROPOFOL;  Surgeon: Dolores Frame, MD;   Location: [facility];  Service: Gastroenterology;  Laterality: N/A;  945 ASA 1   ESOPHAGOGASTRODUODENOSCOPY (EGD) WITH PROPOFOL N/A 03/25/2022   Procedure: ESOPHAGOGASTRODUODENOSCOPY (EGD) WITH PROPOFOL;  Surgeon: Dolores Frame, MD;  Location: [facility];  Service: Gastroenterology;  Laterality: N/A;    SOCIAL HISTORY: Social History   Socioeconomic History   Marital status: Married    Spouse name: Not on file   Number of children: Not on file   Years of education: Not on file   Highest education level: Not on file  Occupational History   Not on file  Tobacco Use   Smoking status: Never   Smokeless tobacco: Never  Vaping Use   Vaping Use: Never used  Substance and Sexual Activity   Alcohol use: Yes    Comment: 1 daily   Drug use: Never   Sexual activity: Yes  Other Topics Concern   Not on file  Social History Narrative   Not on file   Social Determinants of Health   Financial Resource Strain: Not on file  Food Insecurity: Not on file  Transportation Needs: Not on file  Physical Activity: Not on file  Stress: Not on file  Social Connections: Not on file  Intimate Partner Violence: Not on file    FAMILY HISTORY: No family history on file.  ALLERGIES:  has No Known Allergies.  MEDICATIONS:  Current Outpatient Medications  Medication Sig Dispense Refill   omeprazole (PRILOSEC) 40 MG capsule Take 1 capsule (40 mg total) by mouth 2 (two) times daily. 180 capsule 3   No current facility-administered medications for this visit.  REVIEW OF SYSTEMS:   Review of Systems  Constitutional:  Negative for appetite change, fatigue and unexpected weight change.  HENT:   Positive for trouble swallowing.   Respiratory:  Positive for cough.   Gastrointestinal:  Negative for blood in stool and vomiting.  Musculoskeletal:  Positive for arthralgias (7/10 shoulder blades).  All other systems reviewed and are negative.    PHYSICAL EXAMINATION: ECOG  PERFORMANCE STATUS: 0 - Asymptomatic  Vitals:   04/12/22 0812  BP: 120/82  Pulse: 79  Resp: 18  Temp: (!) 97.5 F (36.4 C)  SpO2: 98%   Filed Weights   04/12/22 0812  Weight: 214 lb 4.8 oz (97.2 kg)   Physical Exam Vitals reviewed.  Constitutional:      Appearance: Normal appearance.  Cardiovascular:     Rate and Rhythm: Normal rate and regular rhythm.     Pulses: Normal pulses.     Heart sounds: Normal heart sounds.  Pulmonary:     Effort: Pulmonary effort is normal.     Breath sounds: Normal breath sounds.  Abdominal:     Palpations: Abdomen is soft. There is no mass.     Tenderness: There is no abdominal tenderness.  Musculoskeletal:     Right lower leg: No edema.     Left lower leg: No edema.  Lymphadenopathy:     Upper Body:     Right upper body: No supraclavicular adenopathy.     Left upper body: No supraclavicular adenopathy.     Lower Body: No right inguinal adenopathy. No left inguinal adenopathy.  Neurological:     General: No focal deficit present.     Mental Status: He is alert and oriented to person, place, and time.  Psychiatric:        Mood and Affect: Mood normal.        Behavior: Behavior normal.      LABORATORY DATA:  I have reviewed the data as listed     No data to display             No data to display          RADIOGRAPHIC STUDIES: I have personally reviewed the radiological images as listed and agreed with the findings in the report. NM PET Image Initial (PI) Skull Base To Thigh (F-18 FDG)  Result Date: 04/08/2022 CLINICAL DATA:  Initial treatment strategy for gastric cancer. EXAM: NUCLEAR MEDICINE PET SKULL BASE TO THIGH TECHNIQUE: 10.87 mCi F-18 FDG was injected intravenously. Full-ring PET imaging was performed from the skull base to thigh after the radiotracer. CT data was obtained and used for attenuation correction and anatomic localization. Fasting blood glucose: 96 mg/dl COMPARISON:  CT of the chest abdomen pelvis from  March 30, 2022. FINDINGS: Mediastinal blood pool activity: SUV max 2.70 Liver activity: SUV max NA NECK: No hypermetabolic lymph nodes in the neck. Focal area of increased metabolic activity along the LEFT superior thyroid lobe 4.07 maximum SUV. Small nodule in this area on prior CT measuring 9 mm (image 64/3) calcified nodule on the RIGHT without increased metabolic activity Incidental CT findings: None. CHEST: Hypermetabolic area at the GE junction (image 131/3) no discrete mass lesion, signs of thickening in this area with maximum SUV 6.93. No signs of adenopathy or suspicious pulmonary nodule. Incidental CT findings: Calcified aortic atherosclerosis is mild. Heart size mildly enlarged without pericardial effusion or nodularity. No adenopathy by size criteria in the chest. Lungs are clear. Airways are patent. ABDOMEN/PELVIS: No abnormal hypermetabolic activity within  the liver, pancreas, adrenal glands, or spleen. No hypermetabolic lymph nodes in the abdomen or pelvis. Incidental CT findings: No adenopathy by size criteria in the abdomen. No acute findings relative to liver, gallbladder, pancreas, spleen, adrenal glands or kidneys. Urinary bladder decompressed without signs of stranding or gross thickening. No acute gastrointestinal process. Appendix is normal. SKELETON: No focal hypermetabolic activity to suggest skeletal metastasis. Incidental CT findings: none IMPRESSION: Increased metabolic activity about the GE junction likely the site of the patient's primary tumor. No signs of metastatic disease to the neck, chest, abdomen or pelvis. LEFT thyroid lesion with moderately increased metabolic activity. Recommend thyroid US and biopsy (ref: J Am Coll Radiol. 2015 Feb;12(2): 143-50). Electronically Signed   By: Donzetta Kohut M.D.   On: 04/08/2022 12:09   CT CHEST ABDOMEN PELVIS W CONTRAST  Result Date: 04/01/2022 CLINICAL DATA:  Esophageal cancer, staging CT. Esophageal mass recently found from endoscopy.  Began experiencing difficulty swallowing 2 months ago than associated with pain. EXAM: CT CHEST, ABDOMEN, AND PELVIS WITH CONTRAST TECHNIQUE: Multidetector CT imaging of the chest, abdomen and pelvis was performed following the standard protocol during bolus administration of intravenous contrast. RADIATION DOSE REDUCTION: This exam was performed according to the departmental dose-optimization program which includes automated exposure control, adjustment of the mA and/or kV according to patient size and/or use of iterative reconstruction technique. CONTRAST:  OMNIPAQUE IOHEXOL 300 MG/ML  SOLN COMPARISON:  Esophagram images dated 01/27/2022 showing a GE junction low-grade stricture obstructing 12.5 mm barium tablet. There is no prior CT for comparison. FINDINGS: CT CHEST FINDINGS Cardiovascular: The cardiac size is normal. There is trace calcification in the proximal LAD coronary artery. There is no pericardial effusion or venous distention. Pulmonary arteries are normal caliber and centrally clear. There is homogeneous aortic and great vessel enhancement with mild atherosclerosis in the aortic arch. Mediastinum/Nodes: 2.4 cm densely rim calcified nodule lower pole right lobe of the thyroid gland, underwent FNA biopsy 02/17/2022 with unknown results. This was performed following a thyroid ultrasound 02/09/2022. There are no enlarged intrathoracic or axillary lymph nodes. There are thickened folds at the EG junction but no focal circumscribable mass. PET-CT may be helpful to further evaluate the finding. The trachea and central airways are clear. Lungs/Pleura: No pleural effusion, thickening or pneumothorax. There are few linear scar-like opacities in the lung bases. There is no infiltrate or spiculated nodule. Posteriorly in the right upper lobe at the level of the aortic arch there is a 5 mm noncalcified pleural-based nodule on 4:41. The lungs are otherwise clear. Musculoskeletal: There is a mild thoracic  kyphodextroscoliosis and multilevel mild wedging in the mid to lower thoracic spine with a chronic appearance. There is spondylosis. No worrisome regional bone lesion. CT ABDOMEN PELVIS FINDINGS Hepatobiliary: The liver is mildly steatotic. There is no mass enhancement. The gallbladder and bile ducts are unremarkable. Pancreas: No focal abnormality or ductal dilatation. Spleen: No focal abnormality or splenomegaly. Adrenals/Urinary Tract: There is no adrenal or renal mass enhancement. There is a subcentimeter too small to characterize hypodensity in the inferior pole of the left kidney which is probably a cyst. There is no urinary stone or obstruction. The bladder is contracted and not well seen although could be thickened. Stomach/Bowel: The gastric wall unremarkable below the EG junction. The bowel demonstrates no wall thickening or dilatation. The appendix is normal. There is moderate stool retention ascending and transverse colon. Left-sided diverticula without diverticulitis. Vascular/Lymphatic: No significant vascular findings are present. No enlarged abdominal or  pelvic lymph nodes. Reproductive: Mildly enlarged prostate with impression on the inferior bladder. Other: Multiple pelvic phleboliths. Small umbilical fat hernia. There is no incarcerated hernia. There is no free air, hemorrhage or fluid. Musculoskeletal: There are degenerative changes of the lumbar spine. This includes with advanced disc collapse at L5-S1 foraminal encroachment. There is no regional destructive bone lesion or other suspicious findings. IMPRESSION: 1. There are thickened folds of the EG junction without discrete focal mass visible. PET-CT may be helpful for further evaluation. 2. There is no upstream esophageal dilatation or thickening. 3. No intrathoracic axillary or abdominal adenopathy. 4. 5 mm right upper lobe solitary nodule. Attention on oncologic follow-up imaging recommended. 5. 2.4 cm densely rim calcified right thyroid  nodule, recently underwent FNA biopsy with unknown results. 6. Mild hepatic steatosis. 7. Constipation and diverticulosis. 8. Aortic and coronary artery atherosclerosis. 9. Solitary subcentimeter too small to characterize left renal hypodensity. Probable cyst. 10. Prostatomegaly with bladder thickening which could be due to nondistention, cystitis or hypertrophy. 11. Remaining findings described above. Electronically Signed   By: Almira Bar M.D.   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  He lost about 60 pounds since September, was trying to lose weight by walking 7 miles a day. - EGD (03/25/2022): Large ulcerating mass with no bleeding found at the GE junction, 40-42 cm from incisors.  Mass appeared to extend 1 cm distal towards the cardia.  Partially obstructing and circumferential.  Normal stomach and duodenum. 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 - CT CAP (03/30/2022): Thickened folds of the GE junction without discrete focal mass.  No upstream dilatation.  No intrathoracic, axillary or abdominal adenopathy.  5 mm right upper lobe solitary nodule.  2.4 cm densely calcified right thyroid nodule.  Mild hepatic steatosis. - PET CT scan (04/07/2022): Hypermetabolic area at the GE junction SUV 6.93.  No signs of adenopathy or suspicious lung nodules.  No sign of other metastatic disease.  Left thyroid lesion with moderately increased metabolic activity, SUV 4.07   Social/family history: - Lives at home with his wife.  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  Non-smoker.  Drinks alcohol 5 days/week, moderate. - Maternal grandmother had breast cancer.  Maternal cousin had metastatic breast cancer.    PLAN:  GE Junction adenocarcinoma: - We have reviewed imaging studies and pathology  reports in detail. - Recommend endoscopic ultrasound for T staging and N staging.  We will make a referral to Dr. Evalee Jefferson. - Recommend evaluation by Dr. Cliffton Asters. - Also recommend dietary evaluation. - Discussed possibility of chemoradiation followed by surgery.  2.  Left thyroid nodule: - PET scan showed incidental left thyroid nodule. - He had a biopsy of the right thyroid nodule on 02/17/2022, scant follicular epithelium, Bethesda category 1. - We will work-up the left thyroid nodule after we address the GE junction adenocarcinoma.   All questions were answered. The patient knows to call the clinic with any problems, questions or concerns.   Doreatha Massed, MD, 04/12/22 12:10 PM  [facility] [phone]   I, Alda Ponder, am acting as a scribe for Dr. Doreatha Massed.  I, Doreatha Massed MD, have reviewed the above documentation for accuracy and completeness, and I agree with the above.

## 2022-04-13 ENCOUNTER — Other Ambulatory Visit: Payer: Self-pay

## 2022-04-13 DIAGNOSIS — C160 Malignant neoplasm of cardia: Secondary | ICD-10-CM

## 2022-04-13 NOTE — Progress Notes (Signed)
Patient notified that he has been scheduled for an Upper EUS with Dr Rush Landmark on 04-25-22 at 1:45 at [facility].   Instructions provided for patient by phone, and sent to him in [place] for his review.  Patient did have questions regarding transportation. Patient was provided with information to [facility].  Phone number [phone] was given to the patient.  Patient agreed to plan and verbalized understanding.  No further questions.   ----- Message -----  From: Brien Mates, RN  Sent: 04/13/2022   8:43 AM EDT  To: Algernon Huxley, RN   Christie Beckers,   Dr. Raliegh Ip and I met with the patient yesterday prior to this message thread. I have not updated the patient on this conversation at all, but if you wouldn't mind calling the patient. Is it possible to offer him the July 10th date as offered by Dr. Rush Landmark?   If there are any cancellations or sudden openings sooner, I feel confident that the patient would be accepting of that. They are just looking to expedite the process in any way.   Thank you!  Lilia Pro  [phone]  ----- Message -----  From: Algernon Huxley, RN  Sent: 04/12/2022   4:53 PM EDT  To: Milus Banister, MD; Derek Jack, MD; *   Dr. Ardis Hughs is anyone in particular working on this for you. Just want to make sure we all aren't trying to do this.   ----- Message -----  From: Milus Banister, MD  Sent: 04/12/2022   4:04 PM EDT  To: Derek Jack, MD; Algernon Huxley, RN; *   I actually heard from Dr. Delton Coombes earlier today about this many and we are already working on an appt, have offered Thursday July 20th I believe. That is my first opening.   Thanks    ----- Message -----  From: Brien Mates, RN  Sent: 04/12/2022   3:54 PM EDT  To: Milus Banister, MD; Derek Jack, MD; *   Please see the below message from Dr. Delton Coombes regarding patient:   I have seen this gentleman with newly diagnosed poorly differentiated GE junction  adenocarcinoma, what appears to be T2/3 N0 M0 based on PET scan.  He is otherwise in excellent shape.  Could you or one of your colleagues please get him in for EUS with or without biopsy?  He is also scheduled to meet with Dr. Kipp Brood.  I appreciate your help.  [place]    Please know that the patient's wife will be going out of town soon and will do any days/time to do the EUS as soon as possible. They are requesting this week but we understand that is a big ask.   Thank you!  Lilia Pro

## 2022-04-13 NOTE — Addendum Note (Signed)
Addended by: Stevan Born on: 04/13/2022 10:44 AM   Modules accepted: Orders

## 2022-04-25 ENCOUNTER — Ambulatory Visit (HOSPITAL_BASED_OUTPATIENT_CLINIC_OR_DEPARTMENT_OTHER): Payer: No Typology Code available for payment source | Admitting: Anesthesiology

## 2022-04-25 ENCOUNTER — Other Ambulatory Visit: Payer: Self-pay

## 2022-04-25 ENCOUNTER — Encounter (HOSPITAL_COMMUNITY): Payer: Self-pay | Admitting: Gastroenterology

## 2022-04-25 ENCOUNTER — Encounter (HOSPITAL_COMMUNITY)
Admission: RE | Disposition: A | Discharge status: Home / Self Care | Payer: Self-pay | Source: Home / Self Care | Attending: Gastroenterology

## 2022-04-25 ENCOUNTER — Inpatient Hospital Stay (HOSPITAL_COMMUNITY): Payer: No Typology Code available for payment source | Admitting: Dietician

## 2022-04-25 ENCOUNTER — Telehealth (HOSPITAL_COMMUNITY): Payer: Self-pay | Admitting: Dietician

## 2022-04-25 ENCOUNTER — Ambulatory Visit (HOSPITAL_COMMUNITY)
Admission: RE | Admit: 2022-04-25 | Discharge: 2022-04-25 | Disposition: A | Discharge status: Home / Self Care | Payer: No Typology Code available for payment source | Attending: Gastroenterology | Admitting: Gastroenterology

## 2022-04-25 ENCOUNTER — Ambulatory Visit (HOSPITAL_COMMUNITY): Payer: No Typology Code available for payment source | Admitting: Anesthesiology

## 2022-04-25 DIAGNOSIS — C160 Malignant neoplasm of cardia: Secondary | ICD-10-CM

## 2022-04-25 DIAGNOSIS — C159 Malignant neoplasm of esophagus, unspecified: Secondary | ICD-10-CM | POA: Diagnosis not present

## 2022-04-25 DIAGNOSIS — Z5111 Encounter for antineoplastic chemotherapy: Secondary | ICD-10-CM | POA: Insufficient documentation

## 2022-04-25 DIAGNOSIS — E042 Nontoxic multinodular goiter: Secondary | ICD-10-CM | POA: Insufficient documentation

## 2022-04-25 DIAGNOSIS — K2970 Gastritis, unspecified, without bleeding: Secondary | ICD-10-CM | POA: Insufficient documentation

## 2022-04-25 DIAGNOSIS — R1310 Dysphagia, unspecified: Secondary | ICD-10-CM | POA: Diagnosis not present

## 2022-04-25 DIAGNOSIS — K760 Fatty (change of) liver, not elsewhere classified: Secondary | ICD-10-CM | POA: Insufficient documentation

## 2022-04-25 DIAGNOSIS — Z803 Family history of malignant neoplasm of breast: Secondary | ICD-10-CM | POA: Insufficient documentation

## 2022-04-25 DIAGNOSIS — R911 Solitary pulmonary nodule: Secondary | ICD-10-CM | POA: Insufficient documentation

## 2022-04-25 HISTORY — PX: ESOPHAGOGASTRODUODENOSCOPY (EGD) WITH PROPOFOL: SHX5813

## 2022-04-25 HISTORY — PX: UPPER ESOPHAGEAL ENDOSCOPIC ULTRASOUND (EUS): SHX6562

## 2022-04-25 HISTORY — PX: BIOPSY: SHX5522

## 2022-04-25 SURGERY — ESOPHAGOGASTRODUODENOSCOPY (EGD) WITH PROPOFOL
Anesthesia: Monitor Anesthesia Care

## 2022-04-25 MED ORDER — SODIUM CHLORIDE 0.9 % IV SOLN: INTRAVENOUS | Status: DC

## 2022-04-25 MED ORDER — LACTATED RINGERS IV SOLN: INTRAVENOUS | Status: DC

## 2022-04-25 MED ORDER — MIDAZOLAM HCL 2 MG/2ML IJ SOLN
INTRAMUSCULAR | Status: AC
  Filled 2022-04-25: qty 2

## 2022-04-25 MED ORDER — MIDAZOLAM HCL 5 MG/5ML IJ SOLN
INTRAMUSCULAR | Status: DC | PRN
  Administered 2022-04-25: 2 mg via INTRAVENOUS

## 2022-04-25 MED ORDER — PROPOFOL 500 MG/50ML IV EMUL
INTRAVENOUS | Status: DC | PRN
  Administered 2022-04-25: 150 ug/kg/min via INTRAVENOUS

## 2022-04-25 NOTE — Transfer of Care (Signed)
Immediate Anesthesia Transfer of Care Note  Patient: [name] [name]  Procedure(s) Performed: UPPER ESOPHAGEAL ENDOSCOPIC ULTRASOUND (EUS) BIOPSY ESOPHAGOGASTRODUODENOSCOPY (EGD) WITH PROPOFOL  Patient Location: PACU  Anesthesia Type:MAC  Level of Consciousness: awake, alert  and oriented  Airway & Oxygen Therapy: Patient Spontanous Breathing and Patient connected to face mask oxygen  Post-op Assessment: Report given to RN and Post -op Vital signs reviewed and stable  Post vital signs: Reviewed and stable  Last Vitals:  Vitals Value Taken Time  BP 122/63 04/25/22 1640  Temp 36.6 C 04/25/22 1637  Pulse 86 04/25/22 1649  Resp 19 04/25/22 1649  SpO2 100 % 04/25/22 1649  Vitals shown include unvalidated device data.  Last Pain:  Vitals:   04/25/22 1637  TempSrc: Temporal  PainSc: Asleep         Complications: No notable events documented.

## 2022-04-25 NOTE — Anesthesia Preprocedure Evaluation (Addendum)
Anesthesia Evaluation  Patient identified by MRN, date of birth, ID band Patient awake    Reviewed: Allergy & Precautions, NPO status , Patient's Chart, lab work & pertinent test results  Airway Mallampati: II  TM Distance: >3 FB Neck ROM: Full    Dental no notable dental hx. (+) Teeth Intact, Dental Advisory Given   Pulmonary neg pulmonary ROS,    Pulmonary exam normal breath sounds clear to auscultation       Cardiovascular negative cardio ROS Normal cardiovascular exam Rhythm:Regular Rate:Normal     Neuro/Psych negative neurological ROS  negative psych ROS   GI/Hepatic negative GI ROS, Neg liver ROS,   Endo/Other  negative endocrine ROS  Renal/GU negative Renal ROS  negative genitourinary   Musculoskeletal negative musculoskeletal ROS (+)   Abdominal   Peds  Hematology negative hematology ROS (+)   Anesthesia Other Findings GE junction carcinoma  Reproductive/Obstetrics                            Anesthesia Physical Anesthesia Plan  ASA: 2  Anesthesia Plan: MAC   Post-op Pain Management:    Induction: Intravenous  PONV Risk Score and Plan: Propofol infusion and Treatment may vary due to age or medical condition  Airway Management Planned: Natural Airway  Additional Equipment:   Intra-op Plan:   Post-operative Plan:   Informed Consent: I have reviewed the patients History and Physical, chart, labs and discussed the procedure including the risks, benefits and alternatives for the proposed anesthesia with the patient or authorized representative who has indicated his/her understanding and acceptance.     Dental advisory given  Plan Discussed with: CRNA  Anesthesia Plan Comments:         Anesthesia Quick Evaluation

## 2022-04-25 NOTE — Discharge Instructions (Signed)
YOU HAD AN ENDOSCOPIC PROCEDURE TODAY: Refer to the procedure report and other information in the discharge instructions given to you for any specific questions about what was found during the examination. If this information does not answer your questions, please call [place] office at [phone] to clarify.   YOU SHOULD EXPECT: Some feelings of bloating in the abdomen. Passage of more gas than usual. Walking can help get rid of the air that was put into your GI tract during the procedure and reduce the bloating. If you had a lower endoscopy (such as a colonoscopy or flexible sigmoidoscopy) you may notice spotting of blood in your stool or on the toilet paper. Some abdominal soreness may be present for a day or two, also.  DIET: Your first meal following the procedure should be a light meal and then it is ok to progress to your normal diet. A half-sandwich or bowl of soup is an example of a good first meal. Heavy or fried foods are harder to digest and may make you feel nauseous or bloated. Drink plenty of fluids but you should avoid alcoholic beverages for 24 hours. If you had a esophageal dilation, please see attached instructions for diet.    ACTIVITY: Your care partner should take you home directly after the procedure. You should plan to take it easy, moving slowly for the rest of the day. You can resume normal activity the day after the procedure however YOU SHOULD NOT DRIVE, use power tools, machinery or perform tasks that involve climbing or major physical exertion for 24 hours (because of the sedation medicines used during the test).   SYMPTOMS TO REPORT IMMEDIATELY: A gastroenterologist can be reached at any hour. Please call [phone]  for any of the following symptoms:   Following upper endoscopy (EGD, EUS, ERCP, esophageal dilation) Vomiting of blood or coffee ground material  New, significant abdominal pain  New, significant chest pain or pain under the shoulder blades  Painful or  persistently difficult swallowing  New shortness of breath  Black, tarry-looking or red, bloody stools  FOLLOW UP:  If any biopsies were taken you will be contacted by phone or by letter within the next 1-3 weeks. Call [phone]  if you have not heard about the biopsies in 3 weeks.  Please also call with any specific questions about appointments or follow up tests.  

## 2022-04-25 NOTE — Op Note (Signed)
[facility] Patient Name: [name] [name] Procedure Date: 04/25/2022 MRN: [id number] Attending MD: Justice Britain , MD Date of Birth: 1961/01/04 CSN: [id number] Age: 61 Admit Type: Outpatient Procedure:                Upper EUS Indications:              Pre-treatment staging of esophageal adenocarcinoma,                            Dysphagia Providers:                Justice Britain, MD, Carlyn Reichert, RN, William Dalton, Technician Referring MD:             Maylon Peppers, Adele Barthel. Masneri Medicines:                Monitored Anesthesia Care Complications:            No immediate complications. Estimated Blood Loss:     Estimated blood loss was minimal. Procedure:                Pre-Anesthesia Assessment:                           - Prior to the procedure, a History and Physical                            was performed, and patient medications and                            allergies were reviewed. The patient's tolerance of                            previous anesthesia was also reviewed. The risks                            and benefits of the procedure and the sedation                            options and risks were discussed with the patient.                            All questions were answered, and informed consent                            was obtained. Prior Anticoagulants: The patient has                            taken no previous anticoagulant or antiplatelet                            agents. ASA Grade Assessment: II - A patient with  mild systemic disease. After reviewing the risks                            and benefits, the patient was deemed in                            satisfactory condition to undergo the procedure.                           After obtaining informed consent, the endoscope was                            passed under direct vision. Throughout the                             procedure, the patient's blood pressure, pulse, and                            oxygen saturations were monitored continuously. The                            [id number]-[id number] ([id number]) Olympus endoscope was introduced                            through the mouth, and advanced to the second part                            of duodenum. The [id number]-[id number] ([id number]) Olympus                            radial ultrasound scope was introduced through the                            mouth, and advanced to the esophagus for ultrasound                            examination. The [id number] ([id number]) Olympus was                            introduced through the mouth, and advanced to the                            stomach for ultrasound examination from the                            esophagus and stomach. The upper EUS was                            accomplished without difficulty. The patient                            tolerated the procedure. Scope In: Scope Out: Findings:      ENDOSCOPIC FINDING: :      No gross lesions were noted in  the proximal esophagus and in the mid       esophagus.      A medium-sized, fungating and ulcerating mass with bleeding and stigmata       of recent bleeding was found in the distal esophagus extending into the       gastroesophageal junction and into the cardia, 37 to 40 cm from the       incisors was the mass in regards to the esophagus and extended to 42 cm       within the gastric cardia region. The mass was partially obstructing and       circumferential. I had to use gentle pressure to even allow the adult       endoscope (9.8 mm) to traverse this region.      Patchy mild inflammation characterized by erosions, friability and       granularity was found in the entire examined stomach. Biopsies were       taken with a cold forceps for histology and Helicobacter pylori testing.      No gross lesions were noted in the duodenal bulb, in the first portion       of the  duodenum and in the second portion of the duodenum.      ENDOSONOGRAPHIC FINDING (We had to transition to the Miniprobe to allow       for EUS) :      A hypoechoic mass was found at the gastroesophageal junction extending       into the cardia. The mass was encountered at 37 cm from the incisors and       extended to 42 cm (The esophageal portion is 37-40 cm). The lesion was       circumferential. The endosonographic borders were irregular. There was       sonographic evidence suggesting invasion into and through the muscularis       propria (Layer 4). An intact interface was seen between the mass and the       adjacent structures suggesting a lack of invasion further.      Endosonographic imaging in the thoracic esophagus showed no wall       thickening.      No malignant-appearing lymph nodes were visualized in the middle       paraesophageal mediastinum (level 24M) and lower paraesophageal       mediastinum (level 8L). I could not visualize the GHL, Celiac regions as       a result of mini-probe use so subtle LNs could have been missed (but       PET-CT was negative). Impression:               EGD Impression:                           - No gross lesions in esophagus proximally.                           - Partially obstructing (stenosed to approximately                            9 mm - had to use gentle pressure to allow scope to                            traverse this  region (9.8 mm)), malignant                            esophageal tumor was found at the distal esophagus                            extending into the gastroesophageal junction and                            into the cardia.                           - Gastritis. Biopsied.                           - No gross lesions in the duodenal bulb, in the                            first portion of the duodenum and in the second                            portion of the duodenum.                           EUS Impression:                            - A mass was found at the distal esophagus                            extending into the GE Jxn and the cardia. A tissue                            diagnosis was obtained prior to this exam which is                            consistent with adenocarcinoma. The lesion extends                            through the MP. This was staged T3 N0 Mx by                            endosonographic criteria.                           - No malignant-appearing lymph nodes were                            visualized in the middle paraesophageal mediastinum                            (level 45M) and lower paraesophageal mediastinum                            (level 8L). Moderate Sedation:  Not Applicable - Patient had care per Anesthesia. Recommendation:           - The patient will be observed post-procedure,                            until all discharge criteria are met.                           - Discharge patient to home.                           - Patient has a contact number available for                            emergencies. The signs and symptoms of potential                            delayed complications were discussed with the                            patient. Return to normal activities tomorrow.                            Written discharge instructions were provided to the                            patient.                           - Mechanical soft diet.                           - Observe patient's clinical course.                           - Continue present medications.                           - Follow up with Thoracic Surgery and with Oncology                            for next steps in evaluation and treatment.                           - The findings and recommendations were discussed                            with the patient. Procedure Code(s):        --- Professional ---                           [id number], Esophagogastroduodenoscopy, flexible,                             transoral; with endoscopic ultrasound examination  limited to the esophagus, stomach or duodenum, and                            adjacent structures                           43239, Esophagogastroduodenoscopy, flexible,                            transoral; with biopsy, single or multiple Diagnosis Code(s):        --- Professional ---                           C16.0, Malignant neoplasm of cardia                           K29.70, Gastritis, unspecified, without bleeding                           K22.8, Other specified diseases of esophagus                           I89.9, Noninfective disorder of lymphatic vessels                            and lymph nodes, unspecified                           C15.9, Malignant neoplasm of esophagus, unspecified                           R13.10, Dysphagia, unspecified CPT copyright 2019 American Medical Association. All rights reserved. The codes documented in this report are preliminary and upon coder review may  be revised to meet current compliance requirements. Justice Britain, MD 04/25/2022 4:47:08 PM Number of Addenda: 0

## 2022-04-25 NOTE — Telephone Encounter (Signed)
Nutrition Assessment   Reason for Assessment: dysphagia    ASSESSMENT: 61 year old male with newly diagnosed cancer of GE junction. Treatment plan is currently under work-up. EUS scheduled today (7/10) for staging.   Spoke with patient via telephone. He reports 2-3 months of dysphagia with breads, roasted broccoli, and tortilla chips. Patient reports small bites and is chewing foods well before swallowing. He endorses intentional weight loss since beginning exercise regimen on 07/03/21. Patient reports walking 7 miles 6x week (weather permitting). Patient recalls typically drinking a protein shake (240 kcal, 30 g protein) around 10:30 AM after returning from his walk. He recalls chicken salad on crackers or soups for lunch. Dinner meal is fish, chicken, or pork with roasted potatoes/rice pilaf and vegetable. Patient eats beef 1-2x/month. Occasionally patient will have a bowl of ice cream in the evenings. He is supplementing with premier protein and fairlife shakes, drinks one of these most every day.   Nutrition Focused Physical Exam: unable to complete due to telephone visit   Medications: none    Labs: no recent labs for review    Anthropometrics: Limited weight history for review. Weights have decreased 10% (24 lbs) in 2 months. Pt endorses intentional weight loss with daily walks and healthy eating since September.  Height: 5'10" Weight: 214 lb 4.8 oz (6/27) UBW: 238 lb 12.8 oz (02/10/22) BMI: 30.34    NUTRITION DIAGNOSIS: Food and nutrition related knowledge deficit related to newly diagnosed esophageal cancer as evidenced by no prior need for related nutrition information.    INTERVENTION:  Educated on importance of adequate intake of calories and protein to maintain weight/strength Encouraged snacks in between meals and at bedtime Continue drinking oral nutrition supplements, suggested switching to Ensure Plus/equivalent for added calories  Discusses strategies for altering  texture of foods for ease of intake  Fact sheets, snack ideas, contact information to provided on 7/17 following MD visit per pt request   MONITORING, EVALUATION, GOAL: Patient will tolerate increased calories and protein to minimize weight loss   Next Visit: Monday July 17

## 2022-04-25 NOTE — H&P (Signed)
GASTROENTEROLOGY PROCEDURE H&P NOTE   Primary Care Physician: Lyman Bishop, DO  HPI: [name] [name] is a 61 y.o. male who presents for EGD/EUS to weight and states newly diagnosed GE junction and carcinoma with negative PET/CT.  History reviewed. No pertinent past medical history. Past Surgical History:  Procedure Laterality Date   BIOPSY  03/25/2022   Procedure: BIOPSY;  Surgeon: Montez Morita, Quillian Quince, MD;  Location: [facility];  Service: Gastroenterology;;  GE junction;   COLONOSCOPY     COLONOSCOPY WITH PROPOFOL N/A 03/25/2022   Procedure: COLONOSCOPY WITH PROPOFOL;  Surgeon: Harvel Quale, MD;  Location: [facility];  Service: Gastroenterology;  Laterality: N/A;  945 ASA 1   ESOPHAGOGASTRODUODENOSCOPY (EGD) WITH PROPOFOL N/A 03/25/2022   Procedure: ESOPHAGOGASTRODUODENOSCOPY (EGD) WITH PROPOFOL;  Surgeon: Harvel Quale, MD;  Location: [facility];  Service: Gastroenterology;  Laterality: N/A;   No current facility-administered medications for this encounter.   No current facility-administered medications for this encounter. No Known Allergies History reviewed. No pertinent family history. Social History   Socioeconomic History   Marital status: Married    Spouse name: Not on file   Number of children: Not on file   Years of education: Not on file   Highest education level: Not on file  Occupational History   Not on file  Tobacco Use   Smoking status: Never   Smokeless tobacco: Never  Vaping Use   Vaping Use: Never used  Substance and Sexual Activity   Alcohol use: Yes    Comment: 1 daily   Drug use: Never   Sexual activity: Yes  Other Topics Concern   Not on file  Social History Narrative   Not on file   Social Determinants of Health   Financial Resource Strain: Not on file  Food Insecurity: Not on file  Transportation Needs: Not on file  Physical Activity: Not on file  Stress: Not on file  Social Connections: Not on  file  Intimate Partner Violence: Not on file    Physical Exam: There were no vitals filed for this visit. There is no height or weight on file to calculate BMI. GEN: NAD EYE: Sclerae anicteric ENT: MMM CV: Non-tachycardic GI: Soft, NT/ND NEURO:  Alert & Oriented x 3  Lab Results: No results for input(s): "WBC", "HGB", "HCT", "PLT" in the last 72 hours. BMET No results for input(s): "NA", "K", "CL", "CO2", "GLUCOSE", "BUN", "CREATININE", "CALCIUM" in the last 72 hours. LFT No results for input(s): "PROT", "ALBUMIN", "AST", "ALT", "ALKPHOS", "BILITOT", "BILIDIR", "IBILI" in the last 72 hours. PT/INR No results for input(s): "LABPROT", "INR" in the last 72 hours.   Impression / Plan: This is a 61 y.o.male who presents for EGD/EUS to weight and states newly diagnosed GE junction and carcinoma with negative PET/CT.  The risks of an EUS including intestinal perforation, bleeding, infection, aspiration, and medication effects were discussed as was the possibility it may not give a definitive diagnosis if a biopsy is performed.  When a biopsy of the pancreas is done as part of the EUS, there is an additional risk of pancreatitis at the rate of about 1-2%.  It was explained that procedure related pancreatitis is typically mild, although it can be severe and even life threatening, which is why we do not perform random pancreatic biopsies and only biopsy a lesion/area we feel is concerning enough to warrant the risk.   The risks and benefits of endoscopic evaluation/treatment were discussed with the patient  and/or family; these include but are not limited to the risk of perforation, infection, bleeding, missed lesions, lack of diagnosis, severe illness requiring hospitalization, as well as anesthesia and sedation related illnesses.  The patient's history has been reviewed, patient examined, no change in status, and deemed stable for procedure.  The patient and/or family is agreeable to proceed.     Justice Britain, MD [facility] Office # [phone]

## 2022-04-26 ENCOUNTER — Encounter (HOSPITAL_COMMUNITY): Payer: Self-pay | Admitting: Gastroenterology

## 2022-04-26 NOTE — Anesthesia Postprocedure Evaluation (Signed)
Anesthesia Post Note  Patient: [name] [name]  Procedure(s) Performed: UPPER ESOPHAGEAL ENDOSCOPIC ULTRASOUND (EUS) BIOPSY ESOPHAGOGASTRODUODENOSCOPY (EGD) WITH PROPOFOL     Patient location during evaluation: PACU Anesthesia Type: MAC Level of consciousness: awake and alert Pain management: pain level controlled Vital Signs Assessment: post-procedure vital signs reviewed and stable Respiratory status: spontaneous breathing and respiratory function stable Cardiovascular status: stable Postop Assessment: no apparent nausea or vomiting Anesthetic complications: no   No notable events documented.  Last Vitals:  Vitals:   04/25/22 1700 04/25/22 1710  BP: 121/73 (!) 106/55  Pulse: 79 68  Resp: 20 16  Temp:    SpO2: 100% 99%    Last Pain:  Vitals:   04/25/22 1710  TempSrc:   PainSc: 0-No pain   Pain Goal:                   Merlinda Frederick

## 2022-04-27 LAB — SURGICAL PATHOLOGY

## 2022-04-27 NOTE — Progress Notes (Signed)
[address]       [place] [address]             [phone]                    Perl L Troiani [place] Medical Record #[id number] Date of Birth: 1961-02-28  Referring: Derek Jack, MD Primary Care: Masneri, Adele Barthel, DO Primary Cardiologist: None  Chief Complaint:    Chief Complaint  Patient presents with   Consult    GE junction carcinoma, PET 6/22, CAP CT 6/14, EGD 6/9    History of Present Illness:    [name] [name] 61 y.o. male referred by Dr. 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  He recently presented with dysphagia and intentional weight loss in April 2023.  Subsequently this restriction.  He subsequently underwent upper endoscopy with biopsy along with endoscopic ultrasound which confirmed the diagnosis.  He is able to tolerate a regular diet with occasional dysphagia.  He has started an exercise program follow-up last year and has intentional been losing weight.     Zubrod Score: At the time of surgery this patient's most appropriate activity status/level should be described as: '[x]'$     0    Normal activity, no symptoms '[]'$     1    Restricted in physical strenuous activity but ambulatory, able to do out light work '[]'$     2    Ambulatory and capable of self care, unable to do work activities, up and about               >50 % of waking hours                              '[]'$     3    Only limited self care, in bed greater than 50% of waking hours '[]'$     4    Completely disabled, no self care, confined to bed or chair '[]'$     5    Moribund   No past medical history on file.  Past Surgical History:  Procedure Laterality Date   BIOPSY  03/25/2022   Procedure: BIOPSY;  Surgeon: Harvel Quale, MD;  [facility];  Service: Gastroenterology;;  GE junction;   BIOPSY  04/25/2022   Procedure: BIOPSY;  Surgeon: Irving Copas., MD;  Location: [facility];  Service:  Gastroenterology;;   COLONOSCOPY     COLONOSCOPY WITH PROPOFOL N/A 03/25/2022   Procedure: COLONOSCOPY WITH PROPOFOL;  Surgeon: Harvel Quale, MD;  [facility];  Service: Gastroenterology;  Laterality: N/A;  945 ASA 1   ESOPHAGOGASTRODUODENOSCOPY (EGD) WITH PROPOFOL N/A 03/25/2022   Procedure: ESOPHAGOGASTRODUODENOSCOPY (EGD) WITH PROPOFOL;  Surgeon: Harvel Quale, MD;  [facility];  Service: Gastroenterology;  Laterality: N/A;   ESOPHAGOGASTRODUODENOSCOPY (EGD) WITH PROPOFOL N/A 04/25/2022   Procedure: ESOPHAGOGASTRODUODENOSCOPY (EGD) WITH PROPOFOL;  Surgeon: Rush Landmark Telford Nab., MD;  Location: [facility];  Service: Gastroenterology;  Laterality: N/A;   UPPER ESOPHAGEAL ENDOSCOPIC ULTRASOUND (EUS) N/A 04/25/2022   Procedure: UPPER ESOPHAGEAL ENDOSCOPIC ULTRASOUND (EUS);  Surgeon: Irving Copas., MD;  Location: [facility];  Service: Gastroenterology;  Laterality: N/A;    No family history on file.   Social History   Tobacco Use  Smoking Status Never  Smokeless Tobacco Never    Social History   Substance and Sexual  Activity  Alcohol Use Yes   Comment: 1 daily     No Known Allergies  Current Outpatient Medications  Medication Sig Dispense Refill   omeprazole (PRILOSEC) 40 MG capsule Take 1 capsule (40 mg total) by mouth 2 (two) times daily. (Patient not taking: Reported on 04/29/2022) 180 capsule 3   No current facility-administered medications for this visit.    Review of Systems  Constitutional:  Positive for weight loss. Negative for fever and malaise/fatigue.  HENT: Negative.    Respiratory: Negative.    Cardiovascular: Negative.   Musculoskeletal: Negative.      PHYSICAL EXAMINATION: BP 126/81 (BP Location: Left Arm, Patient Position: Sitting)   Pulse 71   Resp 18   Ht '5\' 10"'$  (1.778 m)   Wt 214 lb (97.1 kg)   SpO2 98% Comment: RA  BMI 30.71 kg/m  Physical Exam Constitutional:      General: He is not  in acute distress.    Appearance: Normal appearance. He is normal weight. He is not ill-appearing or toxic-appearing.  Eyes:     Extraocular Movements: Extraocular movements intact.  Cardiovascular:     Rate and Rhythm: Normal rate.  Pulmonary:     Effort: Pulmonary effort is normal. No respiratory distress.  Musculoskeletal:        General: Normal range of motion.     Cervical back: Normal range of motion.  Neurological:     General: No focal deficit present.     Mental Status: He is alert and oriented to person, place, and time.     Diagnostic Studies & Laboratory data:     PET/CT:  04/07/22 CHEST: Hypermetabolic area at the GE junction (image 131/3) no discrete mass lesion, signs of thickening in this area with maximum SUV 6.93. No signs of adenopathy or suspicious pulmonary nodule.  EGD/EUS:  04/25/22 EGD Impression: - No gross lesions in esophagus proximally. - Partially obstructing (stenosed to approximately 9 mm - had to use gentle pressure to allow scope to traverse this region (9.8 mm)), malignant esophageal tumor was found at the distal esophagus extending into the gastroesophageal junction and into the cardia. - Gastritis. Biopsied. - No gross lesions in the duodenal bulb, in the first portion of the duodenum and in the second portion of the duodenum. EUS Impression: - A mass was found at the distal esophagus extending into the GE Jxn and the cardia. A tissue diagnosis was obtained prior to this exam which is consistent with adenocarcinoma. The lesion extends through the MP. This was staged T3 N0 Mx by endosonographic criteria. - No malignant-appearing lymph nodes were visualized in the middle paraesophageal mediastinum (level 53M) and lower paraesophageal mediastinum (level 8L).   Path:  03/25/22 FINAL MICROSCOPIC DIAGNOSIS:   A. 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  See comment   Radiation  Hx: To be determined     I have independently reviewed the above radiology studies  and reviewed the findings with the patient.   Recent Lab Findings: No results found for: "WBC", "HGB", "HCT", "PLT", "GLUCOSE", "CHOL", "TRIG", "HDL", "LDLDIRECT", "LDLCALC", "ALT", "AST", "NA", "K", "CL", "CREATININE", "BUN", "CO2", "TSH", "INR", "GLUF", "HGBA1C"      Assessment / Plan:   61 year old male with T3 N0 M0 GE junction adenocarcinoma.  Based on its location its spans from 27 cm to 42 cm on endoscopy.  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  He is scheduled to start neoadjuvant chemoradiation.  Once this is completed the surgery will be scheduled between weeks 6 and 10 after his last dose of radiation.  He will require a repeat PET/CT in 4 to 6 weeks after his last dose of radiation.  I will meet with him virtually in 1 month.   \  I  spent 55 minutes with the patient face to face counseling and coordination of care.    Lajuana Matte 04/29/2022 11:15 AM

## 2022-04-28 ENCOUNTER — Encounter: Payer: Self-pay | Admitting: Gastroenterology

## 2022-04-29 ENCOUNTER — Institutional Professional Consult (permissible substitution) (INDEPENDENT_AMBULATORY_CARE_PROVIDER_SITE_OTHER): Payer: No Typology Code available for payment source | Admitting: Thoracic Surgery (Cardiothoracic Vascular Surgery)

## 2022-04-29 VITALS — BP 126/81 | HR 71 | Resp 18 | Ht 70.0 in | Wt 214.0 lb

## 2022-04-29 DIAGNOSIS — C160 Malignant neoplasm of cardia: Secondary | ICD-10-CM

## 2022-05-02 ENCOUNTER — Telehealth: Payer: Self-pay | Admitting: Radiation Oncology

## 2022-05-02 ENCOUNTER — Inpatient Hospital Stay (HOSPITAL_COMMUNITY): Payer: No Typology Code available for payment source | Admitting: Dietician

## 2022-05-02 ENCOUNTER — Inpatient Hospital Stay (HOSPITAL_BASED_OUTPATIENT_CLINIC_OR_DEPARTMENT_OTHER): Payer: No Typology Code available for payment source | Admitting: Hematology

## 2022-05-02 VITALS — BP 127/86 | HR 97 | Temp 97.3°F | Resp 19 | Ht 70.0 in | Wt 209.0 lb

## 2022-05-02 DIAGNOSIS — Z510 Encounter for antineoplastic radiation therapy: Secondary | ICD-10-CM | POA: Diagnosis present

## 2022-05-02 DIAGNOSIS — C160 Malignant neoplasm of cardia: Secondary | ICD-10-CM | POA: Diagnosis not present

## 2022-05-02 DIAGNOSIS — Z5111 Encounter for antineoplastic chemotherapy: Secondary | ICD-10-CM | POA: Diagnosis not present

## 2022-05-02 DIAGNOSIS — R911 Solitary pulmonary nodule: Secondary | ICD-10-CM | POA: Diagnosis not present

## 2022-05-02 DIAGNOSIS — Z803 Family history of malignant neoplasm of breast: Secondary | ICD-10-CM | POA: Diagnosis not present

## 2022-05-02 DIAGNOSIS — K760 Fatty (change of) liver, not elsewhere classified: Secondary | ICD-10-CM | POA: Diagnosis not present

## 2022-05-02 DIAGNOSIS — I700 Atherosclerosis of aorta: Secondary | ICD-10-CM | POA: Diagnosis not present

## 2022-05-02 DIAGNOSIS — E041 Nontoxic single thyroid nodule: Secondary | ICD-10-CM | POA: Diagnosis not present

## 2022-05-02 NOTE — Progress Notes (Signed)
Briefly met with patient at check out. Nutrition handouts and contact information provided per 7/10 telephone visit. Will plan nutrition follow up pending treatment schedule.

## 2022-05-02 NOTE — Patient Instructions (Addendum)
[name] [name] at [facility] Discharge Instructions   You were seen and examined today by Dr. Delton Coombes.  He discussed the plan of treatment with you which involves chemotherapy and radiation followed by surgery. Dr. Raliegh Ip has reached out to a colleague at [facility] to confirm this is the best course of treatment.   We will arrange for you to have radiation therapy in [place] and we will administer chemotherapy here. Treatment will be weekly in our clinic and radiation will be 5 days a week. Chemotherapy drugs are Taxol and carboplatin.   Return as scheduled.       Thank you for choosing [place] at [facility] to provide your oncology and hematology care.  To afford each patient quality time with our provider, please arrive at least 15 minutes before your scheduled appointment time.   If you have a lab appointment with the [place] please come in thru the Main Entrance and check in at the main information desk.  You need to re-schedule your appointment should you arrive 10 or more minutes late.  We strive to give you quality time with our providers, and arriving late affects you and other patients whose appointments are after yours.  Also, if you no show three or more times for appointments you may be dismissed from the clinic at the providers discretion.     Again, thank you for choosing [facility].  Our hope is that these requests will decrease the amount of time that you wait before being seen by our physicians.       _____________________________________________________________  Should you have questions after your visit to [facility], please contact our office at [phone] and follow the prompts.  Our office hours are 8:00 a.m. and 4:30 p.m. Monday - Friday.  Please note that voicemails left after 4:00 p.m. may not be returned until the following business day.  We are closed weekends and major holidays.   You do have access to a nurse 24-7, just call the main number to the clinic [phone] and do not press any options, hold on the line and a nurse will answer the phone.    For prescription refill requests, have your pharmacy contact our office and allow 72 hours.    Due to Covid, you will need to wear a mask upon entering the hospital. If you do not have a mask, a mask will be given to you at the Main Entrance upon arrival. For doctor visits, patients may have 1 support person age 66 or older with them. For treatment visits, patients can not have anyone with them due to social distancing guidelines and our immunocompromised population.

## 2022-05-02 NOTE — Telephone Encounter (Signed)
7/17 @ 4:11 pm Left voicemail for patient to call our office to be schedule for consult with Dr. Sondra Come.

## 2022-05-02 NOTE — Progress Notes (Signed)
START ON PATHWAY REGIMEN - Gastroesophageal     Administer weekly during RT:     Paclitaxel      Carboplatin   **Always confirm dose/schedule in your pharmacy ordering system**  Patient Characteristics: Esophageal & GE Junction, Adenocarcinoma, Preoperative or Nonsurgical Candidate, M0 (Clinical Staging), cT2 or Higher or cN+, Surgical Candidate (Up to cT4a) - Preoperative Therapy, GE Junction Therapeutic Status: Preoperative or Nonsurgical Candidate, M0 (Clinical Staging) Histology: Adenocarcinoma Disease Classification: GE Junction AJCC Grade: G3 AJCC 8 Stage Grouping: III AJCC T Category: cT3 AJCC N Category: cN0 AJCC M Category: cM0 Intent of Therapy: Curative Intent, Discussed with Patient

## 2022-05-02 NOTE — Progress Notes (Signed)
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    CANCER STAGING: Cancer Staging  GE junction carcinoma ([place]) Staging form: Esophagus - Adenocarcinoma, AJCC 8th Edition - Clinical stage from 04/12/2022: Stage III (cT3, cN0, cM0, G3) - Unsigned   INTERVAL HISTORY:  [name], a 61 y.o. male, returns for routine follow-up of his poor differentiated GE junction adenocarcinoma. [name] [name] was last seen on 04/12/2022.   Today he reports feeling good. He denies trouble swallowing.   REVIEW OF SYSTEMS:  Review of Systems  Constitutional:  Negative for appetite change and fatigue.  HENT:   Negative for trouble swallowing.   All other systems reviewed and are negative.   PAST MEDICAL/SURGICAL HISTORY:  No past medical history on file. Past Surgical History:  Procedure Laterality Date   BIOPSY  03/25/2022   Procedure: BIOPSY;  Surgeon: Harvel Quale, MD;  [facility];  Service: Gastroenterology;;  GE junction;   BIOPSY  04/25/2022   Procedure: BIOPSY;  Surgeon: Irving Copas., MD;  Location: [facility];  Service: Gastroenterology;;   COLONOSCOPY     COLONOSCOPY WITH PROPOFOL N/A 03/25/2022   Procedure: COLONOSCOPY WITH PROPOFOL;  Surgeon: Harvel Quale, MD;  [facility];  Service: Gastroenterology;  Laterality: N/A;  945 ASA 1   ESOPHAGOGASTRODUODENOSCOPY (EGD) WITH PROPOFOL N/A 03/25/2022   Procedure: ESOPHAGOGASTRODUODENOSCOPY (EGD) WITH PROPOFOL;  Surgeon: Harvel Quale, MD;  [facility];  Service: Gastroenterology;  Laterality: N/A;   ESOPHAGOGASTRODUODENOSCOPY (EGD) WITH PROPOFOL N/A 04/25/2022   Procedure: ESOPHAGOGASTRODUODENOSCOPY (EGD) WITH PROPOFOL;  Surgeon: Rush Landmark Telford Nab., MD;  [facility]: [facility];  Service: Gastroenterology;  Laterality: N/A;   UPPER ESOPHAGEAL ENDOSCOPIC ULTRASOUND (EUS) N/A 04/25/2022   Procedure: UPPER ESOPHAGEAL ENDOSCOPIC ULTRASOUND (EUS);  Surgeon: Irving Copas., MD;  Location: [facility];  Service: Gastroenterology;  Laterality: N/A;    SOCIAL HISTORY:  Social History   Socioeconomic History   Marital status: Married    Spouse name: Not on file   Number of children: Not on file   Years of education: Not on file   Highest education level: Not on file  Occupational History   Not on file  Tobacco Use   Smoking status: Never   Smokeless tobacco: Never  Vaping Use   Vaping Use: Never used  Substance and Sexual Activity   Alcohol use: Yes    Comment: 1 daily   Drug use: Never   Sexual activity: Yes  Other Topics Concern   Not on file  Social History Narrative   Not on file   Social Determinants of Health   Financial Resource Strain: Not on file  Food Insecurity: Not on file  Transportation Needs: Not on file  Physical Activity: Not on file  Stress: Not on file  Social Connections: Not on file  Intimate Partner Violence: Not on file    FAMILY HISTORY:  No family history on file.  CURRENT MEDICATIONS:  Current Outpatient Medications  Medication Sig Dispense Refill   omeprazole (PRILOSEC) 40 MG capsule Take 1 capsule (40 mg  total) by mouth 2 (two) times daily. (Patient not taking: Reported on 04/29/2022) 180 capsule 3   No current facility-administered medications for this visit.    ALLERGIES:  No Known Allergies  PHYSICAL EXAM:  Performance status (ECOG): 0 - Asymptomatic  There were no vitals filed for this visit. Wt Readings from Last 3 Encounters:  04/29/22 214 lb (97.1 kg)   04/25/22 214 lb 4.6 oz (97.2 kg)  04/12/22 214 lb 4.8 oz (97.2 kg)   Physical Exam Vitals reviewed.  Constitutional:      Appearance: Normal appearance.  Cardiovascular:     Rate and Rhythm: Normal rate and regular rhythm.     Pulses: Normal pulses.     Heart sounds: Normal heart sounds.  Pulmonary:     Effort: Pulmonary effort is normal.     Breath sounds: Normal breath sounds.  Neurological:     General: No focal deficit present.     Mental Status: He is alert and oriented to person, place, and time.  Psychiatric:        Mood and Affect: Mood normal.        Behavior: Behavior normal.      LABORATORY DATA:  I have reviewed the labs as listed.      No data to display             No data to display          DIAGNOSTIC IMAGING:  I have independently reviewed the scans and discussed with the patient. NM PET Image Initial (PI) Skull Base To Thigh (F-18 FDG)  Result Date: 04/08/2022 CLINICAL DATA:  Initial treatment strategy for gastric cancer. EXAM: NUCLEAR MEDICINE PET SKULL BASE TO THIGH TECHNIQUE: 10.87 mCi F-18 FDG was injected intravenously. Full-ring PET imaging was performed from the skull base to thigh after the radiotracer. CT data was obtained and used for attenuation correction and anatomic localization. Fasting blood glucose: 96 mg/dl COMPARISON:  CT of the chest abdomen pelvis from March 30, 2022. FINDINGS: Mediastinal blood pool activity: SUV max 2.70 Liver activity: SUV max NA NECK: No hypermetabolic lymph nodes in the neck. Focal area of increased metabolic activity along the LEFT superior thyroid lobe 4.07 maximum SUV. Small nodule in this area on prior CT measuring 9 mm (image 64/3) calcified nodule on the RIGHT without increased metabolic activity Incidental CT findings: None. CHEST: Hypermetabolic area at the GE junction (image 131/3) no discrete mass lesion, signs of thickening in this area with maximum SUV 6.93. No signs of adenopathy or suspicious  pulmonary nodule. Incidental CT findings: Calcified aortic atherosclerosis is mild. Heart size mildly enlarged without pericardial effusion or nodularity. No adenopathy by size criteria in the chest. Lungs are clear. Airways are patent. ABDOMEN/PELVIS: No abnormal hypermetabolic activity within the liver, pancreas, adrenal glands, or spleen. No hypermetabolic lymph nodes in the abdomen or pelvis. Incidental CT findings: No adenopathy by size criteria in the abdomen. No acute findings relative to liver, gallbladder, pancreas, spleen, adrenal glands or kidneys. Urinary bladder decompressed without signs of stranding or gross thickening. No acute gastrointestinal process. Appendix is normal. SKELETON: No focal hypermetabolic activity to suggest skeletal metastasis. Incidental CT findings: none IMPRESSION: Increased metabolic activity about the GE junction likely the site of the patient's primary tumor. No signs of metastatic disease to the neck, chest, abdomen or pelvis. LEFT thyroid lesion with moderately increased metabolic activity. Recommend thyroid US and biopsy (ref: J Am Coll Radiol. 2015 Feb;12(2): 143-50). Electronically Signed   By: Cay Schillings  Wile M.D.   On: 04/08/2022 12:09     ASSESSMENT:  T2/3 N0 M0 GE Junction poorly differentiated adenocarcinoma: - Presentation with dysphagia to certain foods like bread and broccoli since February 2023.  He lost about 60 pounds since September, was trying to lose weight by walking 7 miles a day. - EGD (03/25/2022): Large ulcerating mass with no bleeding found at the GE junction, 40-42 cm from incisors.  Mass appeared to extend 1 cm distal towards the cardia.  Partially obstructing and circumferential.  Normal stomach and duodenum. 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 - CT CAP (03/30/2022): Thickened folds of the GE junction without discrete focal mass.  No upstream dilatation.  No intrathoracic, axillary or  abdominal adenopathy.  5 mm right upper lobe solitary nodule.  2.4 cm densely calcified right thyroid nodule.  Mild hepatic steatosis. - PET CT scan (04/07/2022): Hypermetabolic area at the GE junction SUV 6.93.  No signs of adenopathy or suspicious lung nodules.  No sign of other metastatic disease.  Left thyroid lesion with moderately increased metabolic activity, SUV 7.65 - EGD/EUS (04/25/2022): Malignant esophageal tumor at distal esophagus extending into GE junction and into the cardia.  EUS staging T3N0.  No malignant appearing lymph nodes visualized.  Mass extends from 37 cm to 42 cm (esophageal portion is 37-40 cm).  Lesion was circumferential.  Sonographic evidence suggesting invasion into 1 through the muscularis propria.  Intact interface seen between the mass and adjacent structures. - Dr. Kipp Brood recommended surgery between 6 to 10 weeks after completion of chemoradiation with repeat PET scan in 4 to 6 weeks after last dose of radiation.    Social/family history: - Lives at home with his wife.  He is a retired Electronics engineer at [employer].  Non-smoker.  Drinks alcohol 5 days/week, moderate. - Maternal grandmother had breast cancer.  Maternal cousin had metastatic breast cancer.   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 - He has met with Dr. Kipp Brood. - I have also reached out to my colleague at [facility] to see if they have any other options for management including clinical trials because of signet ring cell features. - We will proceed with carboplatin, paclitaxel with concurrent radiation followed by surgery.  We will plan on adjuvant nivolumab for 1 year (Checkmate 577) if no complete pathological response is achieved. - We discussed the chemotherapy regimen in detail.  We discussed side effects. - I have recommended port placement. - We will make referral to radiation oncology.  2.   Incidental PET positive left thyroid nodule: - Biopsy of the right thyroid nodule on 01/20/5034, scant follicular epithelium, Bethesda category 1. - We will work-up left thyroid nodule after completion of treatments.   Orders placed this encounter:  No orders of the defined types were placed in this encounter.    Derek Jack, MD [place] [phone]   I, Thana Ates, am acting as a scribe for Dr. Derek Jack.  I, Derek Jack MD, have reviewed the above documentation for accuracy and completeness, and I agree with the above.

## 2022-05-03 ENCOUNTER — Telehealth: Payer: Self-pay | Admitting: *Deleted

## 2022-05-03 ENCOUNTER — Other Ambulatory Visit (HOSPITAL_COMMUNITY): Payer: Self-pay | Admitting: Radiology

## 2022-05-03 NOTE — Telephone Encounter (Signed)
RETURNED PATIENT'S PHONE CALL, SPOKE WITH PATIENT. ?

## 2022-05-03 NOTE — H&P (Signed)
Chief Complaint: Patient was seen in consultation today for GE junction carcinoma  Referring Physician(s): Katragadda,Sreedhar  Supervising Physician: Ruthann Cancer  Patient Status: [facility] - Out-pt  History of Present Illness: [name] [name] is a 61 y.o. male with recent diagnosis of GE junction adenocarcinoma, signet ring cell features with plans for upcoming chemotherapy.  He is in need of durable venous access and has been referred to [facility] for Port-A-Cath placement at the request of Dr. Delton Coombes.    Patient presents to [facility] today for Port-A-Cath placement.  He is understanding of the goals of the procedure today and is agreeable to proceed.  His wife is out of town and he has friends assisting with transportation and care at home.  He feels he has arranged adequate post-procedure support at home.  He has been NPO.  History reviewed. No pertinent past medical history.  Past Surgical History:  Procedure Laterality Date   BIOPSY  03/25/2022   Procedure: BIOPSY;  Surgeon: Harvel Quale, MD;  [facility];  Service: Gastroenterology;;  GE junction;   BIOPSY  04/25/2022   Procedure: BIOPSY;  Surgeon: Irving Copas., MD;  Location: [facility];  Service: Gastroenterology;;   COLONOSCOPY     COLONOSCOPY WITH PROPOFOL N/A 03/25/2022   Procedure: COLONOSCOPY WITH PROPOFOL;  Surgeon: Harvel Quale, MD;  [facility];  Service: Gastroenterology;  Laterality: N/A;  945 ASA 1   ESOPHAGOGASTRODUODENOSCOPY (EGD) WITH PROPOFOL N/A 03/25/2022   Procedure: ESOPHAGOGASTRODUODENOSCOPY (EGD) WITH PROPOFOL;  Surgeon: Harvel Quale, MD;  [facility];  Service: Gastroenterology;  Laterality: N/A;   ESOPHAGOGASTRODUODENOSCOPY (EGD) WITH PROPOFOL N/A 04/25/2022   Procedure: ESOPHAGOGASTRODUODENOSCOPY (EGD) WITH PROPOFOL;  Surgeon: Rush Landmark Telford Nab., MD;  [facility];  Service: Gastroenterology;   Laterality: N/A;   UPPER ESOPHAGEAL ENDOSCOPIC ULTRASOUND (EUS) N/A 04/25/2022   Procedure: UPPER ESOPHAGEAL ENDOSCOPIC ULTRASOUND (EUS);  Surgeon: Irving Copas., MD;  Location: [facility];  Service: Gastroenterology;  Laterality: N/A;    Allergies: Patient has no known allergies.  Medications: Prior to Admission medications   Not on File     History reviewed. No pertinent family history.  Social History   Socioeconomic History   Marital status: Married    Spouse name: Not on file   Number of children: Not on file   Years of education: Not on file   Highest education level: Not on file  Occupational History   Not on file  Tobacco Use   Smoking status: Never   Smokeless tobacco: Never  Vaping Use   Vaping Use: Never used  Substance and Sexual Activity   Alcohol use: Yes    Comment: 1 daily   Drug use: Never   Sexual activity: Yes  Other Topics Concern   Not on file  Social History Narrative   Not on file   Social Determinants of Health   Financial Resource Strain: Not on file  Food Insecurity: Not on file  Transportation Needs: Not on file  Physical Activity: Not on file  Stress: Not on file  Social Connections: Not on file     Review of Systems: A 12 point ROS discussed and pertinent positives are indicated in the HPI above.  All other systems are negative.  Review of Systems  Constitutional:  Negative for fatigue and fever.  HENT:  Positive for trouble swallowing.   Respiratory:  Negative for cough and shortness of breath.   Gastrointestinal:  Negative for abdominal pain  and nausea.  Genitourinary:  Negative for dysuria.  Musculoskeletal:  Negative for back pain.  Psychiatric/Behavioral:  Negative for behavioral problems and confusion.     Vital Signs: BP 116/82   Pulse 67   Temp 98.1 F (36.7 C) (Oral)   Resp 18   Ht '5\' 10"'$  (1.778 m)   Wt 209 lb (94.8 kg)   SpO2 98%   BMI 29.99 kg/m   Physical Exam Vitals and nursing note  reviewed.  Constitutional:      General: He is not in acute distress.    Appearance: Normal appearance. He is not ill-appearing.  HENT:     Mouth/Throat:     Mouth: Mucous membranes are moist.     Pharynx: Oropharynx is clear.  Pulmonary:     Effort: Pulmonary effort is normal.     Breath sounds: Normal breath sounds.  Abdominal:     General: Abdomen is flat.     Palpations: Abdomen is soft.  Skin:    General: Skin is warm and dry.  Neurological:     General: No focal deficit present.     Mental Status: He is alert and oriented to person, place, and time. Mental status is at baseline.  Psychiatric:        Mood and Affect: Mood normal.        Behavior: Behavior normal.        Thought Content: Thought content normal.        Judgment: Judgment normal.      MD Evaluation Airway: WNL Heart: WNL Abdomen: WNL Chest/ Lungs: WNL ASA  Classification: 2 Mallampati/Airway Score: Two   Imaging: NM PET Image Initial (PI) Skull Base To Thigh (F-18 FDG)  Result Date: 04/08/2022 CLINICAL DATA:  Initial treatment strategy for gastric cancer. EXAM: NUCLEAR MEDICINE PET SKULL BASE TO THIGH TECHNIQUE: 10.87 mCi F-18 FDG was injected intravenously. Full-ring PET imaging was performed from the skull base to thigh after the radiotracer. CT data was obtained and used for attenuation correction and anatomic localization. Fasting blood glucose: 96 mg/dl COMPARISON:  CT of the chest abdomen pelvis from March 30, 2022. FINDINGS: Mediastinal blood pool activity: SUV max 2.70 Liver activity: SUV max NA NECK: No hypermetabolic lymph nodes in the neck. Focal area of increased metabolic activity along the LEFT superior thyroid lobe 4.07 maximum SUV. Small nodule in this area on prior CT measuring 9 mm (image 64/3) calcified nodule on the RIGHT without increased metabolic activity Incidental CT findings: None. CHEST: Hypermetabolic area at the GE junction (image 131/3) no discrete mass lesion, signs of  thickening in this area with maximum SUV 6.93. No signs of adenopathy or suspicious pulmonary nodule. Incidental CT findings: Calcified aortic atherosclerosis is mild. Heart size mildly enlarged without pericardial effusion or nodularity. No adenopathy by size criteria in the chest. Lungs are clear. Airways are patent. ABDOMEN/PELVIS: No abnormal hypermetabolic activity within the liver, pancreas, adrenal glands, or spleen. No hypermetabolic lymph nodes in the abdomen or pelvis. Incidental CT findings: No adenopathy by size criteria in the abdomen. No acute findings relative to liver, gallbladder, pancreas, spleen, adrenal glands or kidneys. Urinary bladder decompressed without signs of stranding or gross thickening. No acute gastrointestinal process. Appendix is normal. SKELETON: No focal hypermetabolic activity to suggest skeletal metastasis. Incidental CT findings: none IMPRESSION: Increased metabolic activity about the GE junction likely the site of the patient's primary tumor. No signs of metastatic disease to the neck, chest, abdomen or pelvis. LEFT thyroid lesion with moderately increased  metabolic activity. Recommend thyroid US and biopsy (ref: J Am Coll Radiol. 2015 Feb;12(2): 143-50). Electronically Signed   By: Zetta Bills M.D.   On: 04/08/2022 12:09    Labs:  CBC: No results for input(s): "WBC", "HGB", "HCT", "PLT" in the last 8760 hours.  COAGS: No results for input(s): "INR", "APTT" in the last 8760 hours.  BMP: No results for input(s): "NA", "K", "CL", "CO2", "GLUCOSE", "BUN", "CALCIUM", "CREATININE", "GFRNONAA", "GFRAA" in the last 8760 hours.  Invalid input(s): "CMP"  LIVER FUNCTION TESTS: No results for input(s): "BILITOT", "AST", "ALT", "ALKPHOS", "PROT", "ALBUMIN" in the last 8760 hours.  TUMOR MARKERS: No results for input(s): "AFPTM", "CEA", "CA199", "CHROMGRNA" in the last 8760 hours.  Assessment and Plan: Patient with past medical history of GE junction adenocarcinoma  presents with complaint of poor venous access.  IR consulted for Port-A-Cath placement at the request of Dr. Delton Coombes. Case reviewed by Dr. Serafina Royals who approves patient for procedure.  Patient presents today in their usual state of health.  He has been NPO and is not currently on blood thinners.   Risks and benefits of image guided port-a-catheter placement was discussed with the patient including, but not limited to bleeding, infection, pneumothorax, or fibrin sheath development and need for additional procedures.  All of the patient's questions were answered, patient is agreeable to proceed. Consent signed and in chart.  Thank you for this interesting consult.  I greatly enjoyed meeting [name] [name] and look forward to participating in their care.  A copy of this report was sent to the requesting provider on this date.  Electronically Signed: Docia Barrier, PA 05/04/2022, 8:21 AM   I spent a total of  30 Minutes   in face to face in clinical consultation, greater than 50% of which was counseling/coordinating care for GE junction adenocarcinoma.

## 2022-05-04 ENCOUNTER — Other Ambulatory Visit: Payer: Self-pay

## 2022-05-04 ENCOUNTER — Ambulatory Visit (HOSPITAL_COMMUNITY)
Admission: RE | Admit: 2022-05-04 | Discharge: 2022-05-04 | Disposition: A | Discharge status: Home / Self Care | Payer: No Typology Code available for payment source | Source: Ambulatory Visit | Attending: Hematology | Admitting: Hematology

## 2022-05-04 ENCOUNTER — Encounter (HOSPITAL_COMMUNITY): Payer: Self-pay

## 2022-05-04 DIAGNOSIS — C160 Malignant neoplasm of cardia: Secondary | ICD-10-CM | POA: Diagnosis present

## 2022-05-04 HISTORY — PX: IR IMAGING GUIDED PORT INSERTION: IMG5740

## 2022-05-04 MED ORDER — FENTANYL CITRATE (PF) 100 MCG/2ML IJ SOLN
INTRAMUSCULAR | Status: AC | PRN
  Administered 2022-05-04: 50 ug via INTRAVENOUS

## 2022-05-04 MED ORDER — LIDOCAINE-EPINEPHRINE 1 %-1:100000 IJ SOLN
INTRAMUSCULAR | Status: AC
  Filled 2022-05-04: qty 1

## 2022-05-04 MED ORDER — HEPARIN SOD (PORK) LOCK FLUSH 100 UNIT/ML IV SOLN
INTRAVENOUS | Status: AC
  Filled 2022-05-04: qty 5

## 2022-05-04 MED ORDER — LIDOCAINE-EPINEPHRINE 1 %-1:100000 IJ SOLN
INTRAMUSCULAR | Status: AC | PRN
  Administered 2022-05-04: 20 mL

## 2022-05-04 MED ORDER — FENTANYL CITRATE (PF) 100 MCG/2ML IJ SOLN
INTRAMUSCULAR | Status: AC
  Filled 2022-05-04: qty 2

## 2022-05-04 MED ORDER — MIDAZOLAM HCL 2 MG/2ML IJ SOLN
INTRAMUSCULAR | Status: AC
  Filled 2022-05-04: qty 2

## 2022-05-04 MED ORDER — MIDAZOLAM HCL 2 MG/2ML IJ SOLN
INTRAMUSCULAR | Status: AC | PRN
  Administered 2022-05-04: 1 mg via INTRAVENOUS

## 2022-05-04 NOTE — Procedures (Signed)
Interventional Radiology Procedure Note ° °Procedure: Single Lumen Power Port Placement   ° °Access:  Right internal jugular vein ° °Findings: Catheter tip positioned at cavoatrial junction. Port is ready for immediate use.  ° °Complications: None ° °EBL: < 10 mL ° °Recommendations:  °- Ok to shower in 24 hours °- Do not submerge for 7 days °- Routine line care  ° ° °Whitley Patchen, MD ° ° ° °

## 2022-05-04 NOTE — Progress Notes (Signed)
Patient was given discharge instructions. He verbalized understanding. 

## 2022-05-05 ENCOUNTER — Ambulatory Visit: Payer: No Typology Code available for payment source | Admitting: Radiation Oncology

## 2022-05-05 ENCOUNTER — Ambulatory Visit: Payer: No Typology Code available for payment source

## 2022-05-06 NOTE — Progress Notes (Signed)
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  See comment   B. COLON, TRANSVERSE, POLYPECTOMY:  - Tubular adenoma  - Negative for high-grade dysplasia or malignancy   Past/Anticipated interventions by surgeon, if any: Dr. Kipp Brood recommended surgery between 6 to 10 weeks after completion of chemoradiation with repeat PET scan in 4 to 6 weeks after last dose of radiation.  Past/Anticipated interventions by medical oncology, if any: carboplatin, paclitaxel with concurrent radiation followed by surgery.  Will plan on adjuvant nivolumab for 1 year (Checkmate 577) if no complete pathological response is achieved.  Weight changes, if any: He has lost about 60 pounds since September  Bowel/Bladder complaints, if any: {:18581}  Nausea / Vomiting, if any: {:18581}  Pain issues, if any:  {:18581}  Any blood per rectum:   {:18581}  SAFETY ISSUES: Prior radiation? {:18581} Pacemaker/ICD? {:18581} Possible current pregnancy? no Is the patient on methotrexate? {:18581}  Current Complaints/Details:

## 2022-05-08 NOTE — Progress Notes (Signed)
Radiation Oncology         [phone] ________________________________  Initial Outpatient Consultation  Name: [name] [name] MRN: [id number]  Date: 05/09/2022  DOB: Mar 10, 1961  CC:Masneri, Adele Barthel, DO  Derek Jack, MD   REFERRING PHYSICIAN: Derek Jack, MD  DIAGNOSIS: The encounter diagnosis was GE junction carcinoma [facility]).  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 male who is accompanied by his wife. he is seen as a courtesy of Dr. Delton Coombes for an opinion concerning radiation therapy as part of management for his recently diagnosed GE junction carcinoma. The patient presented to [facility], Dr. Montez Morita, on 02/10/22 with the cc of persistent and recurrent episodes of dysphagia to solids x 10 weeks, with associated upper thoracic midline pain with swallowing. Prior to that visit, the patient underwent a barium esophagram on 01/27/2022 which showed a stricture at the GE junction that did not allow the passage of the barium tablet.  Esophagram also showed indentation of the intragastric area at the GE junction concerning for mass versus prominence of the GE junction, as well as a suspected right thyroid mass. Thyroid ultrasound performed on 02/09/22 prior to this visit showed a total of 6 nodules, one of which warranting biopsy, located in the inferior right thyroid lobe.    FNA of the right lower thyroid nodule collected on 02/17/22 revealed scant follicular epithelium present.   Subsequently, the patient underwent an EGD with biopsies on 03/25/22 under the care of Dr. Montez Morita. Biopsy collected of the GE junction revealed poorly differentiated gastric  adenocarcinoma with signet ring cell features. Polypectomy of the transverse colon also revealed findings consisted with tubular adenoma.  CT of the chest abdomen and pelvis performed on 03/30/22 showed presence of thickening at the EG junction but no presence of lymphadenopathy. CT also showed a 5 mm right upper lobe solitary nodule, and the recently biopsied 2.4 cm densely rim calcified right thyroid nodule.  PET scan on 04/07/22 showed increased metabolic activity about the GE junction likely at the site of the patient's primary tumor. PET also showed moderately increased metabolic activity to a left thyroid lesion. Otherwise, no signs of metastatic disease to the neck, chest, abdomen or pelvis were appreciated.   Accordingly, the patient was referred to Dr. Delton Coombes on 04/12/22 for further management. During this visit, the patient reported pain between his shoulder blades with swallowing solid foods such as breads and broccoli since February. Following review of recent imaging studies and pathology, Dr. Delton Coombes recommended proceeding with an endoscopic ultrasound for staging purposes, and referrals to cardiothoracic surgery, and GI for stomach biopsies.   Biopsy of the stomach collected on 04/25/22 showed gastric antral mucosa with mild nonspecific reactive gastropathy.  The patient recently met with Dr. Kipp Brood on 04/29/22. Based on the tumors location and size, Dr. 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 Given so, the paitent was advised to proceed with neoadjuvant chemoradiation. Per his most recent follow up visit with Dr. Delton Coombes on 05/02/22, the patient will proceed with carboplatin, paclitaxel with concurrent radiation followed by surgery.  This will be followed by adjuvant nivolumab for 1 year (Checkmate 577) if no complete pathological response is achieved.  On  endoscopic ultrasound in the patient was noted to have a medium sized fungating and  ulcerating mass located in the distal esophagus that extended from 37 to 42 cm.  3 cm were located within the distal esophagus.  2 cm extending to the gastric cardia region.  Mass was partially obstructing and circumferential.  The adult scope could be passed through the region (9.8 mm).  there was sonographic evidence suggesting invasion into and through the muscularis propria.  There is no extension to other structures.  (T3, N0).  No malignant appearing lymph nodes were seen.  PREVIOUS RADIATION THERAPY: No  PAST MEDICAL HISTORY: No past medical history on file.  PAST SURGICAL HISTORY: Past Surgical History:  Procedure Laterality Date   BIOPSY  03/25/2022   Procedure: BIOPSY;  Surgeon: Harvel Quale, MD;  [facility];  Service: Gastroenterology;;  GE junction;   BIOPSY  04/25/2022   Procedure: BIOPSY;  Surgeon: Irving Copas., MD;  Location: [facility];  Service: Gastroenterology;;   COLONOSCOPY     COLONOSCOPY WITH PROPOFOL N/A 03/25/2022   Procedure: COLONOSCOPY WITH PROPOFOL;  Surgeon: Harvel Quale, MD;  [facility]: [facility];  Service: Gastroenterology;  Laterality: N/A;  945 ASA 1   ESOPHAGOGASTRODUODENOSCOPY (EGD) WITH PROPOFOL N/A 03/25/2022   Procedure: ESOPHAGOGASTRODUODENOSCOPY (EGD) WITH PROPOFOL;  Surgeon: Harvel Quale, MD;  [facility];  Service: Gastroenterology;  Laterality: N/A;   ESOPHAGOGASTRODUODENOSCOPY (EGD) WITH PROPOFOL N/A 04/25/2022   Procedure: ESOPHAGOGASTRODUODENOSCOPY (EGD) WITH PROPOFOL;  Surgeon: Rush Landmark Telford Nab., MD;  Location: [facility];  Service: Gastroenterology;  Laterality: N/A;   IR IMAGING GUIDED PORT INSERTION  05/04/2022   UPPER ESOPHAGEAL ENDOSCOPIC ULTRASOUND (EUS) N/A 04/25/2022   Procedure: UPPER ESOPHAGEAL ENDOSCOPIC ULTRASOUND (EUS);  Surgeon: Irving Copas., MD;  Location: [facility];  Service: Gastroenterology;  Laterality: N/A;    FAMILY HISTORY:  Family  History  Problem Relation Age of Onset   Cancer Maternal Grandmother        Breast cancer   Cancer Other        Maternal Cousin - breast cancer    SOCIAL HISTORY: 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 Social History   Tobacco Use   Smoking status: Never   Smokeless tobacco: Never  Vaping Use   Vaping Use: Never used  Substance Use Topics   Alcohol use: Yes    Comment: 1 daily   Drug use: Never    ALLERGIES: No Known Allergies  MEDICATIONS:  No current outpatient medications on file.   No current facility-administered medications for this encounter.    REVIEW OF SYSTEMS:  A 10+ POINT REVIEW OF SYSTEMS WAS OBTAINED including neurology, dermatology, psychiatry, cardiac, respiratory, lymph, extremities, GI, GU, musculoskeletal, constitutional, reproductive, HEENT.  He reports he is able to eat solid foods if she uses them well.  He denies any pain with swallowing.  He has had some intentional weight loss over the past few months.  He walks several miles during the week for exercise.   PHYSICAL EXAM:  height is 5' 10"  (1.778 m) and weight is 210 lb 2 oz (95.3 kg). His oral temperature is 99.1 F (37.3 C). His blood pressure is 115/79 and his pulse is 78. His respiration is 18 and oxygen saturation is 100%.   General: Alert and oriented, in no acute distress HEENT: Head is normocephalic. Extraocular movements are intact. Oropharynx is clear. Neck: Neck is supple, no palpable cervical or  supraclavicular lymphadenopathy. Heart: Regular in rate and rhythm with no murmurs, rubs, or gallops. Chest: Clear to auscultation bilaterally, with no rhonchi, wheezes, or rales. Abdomen: Soft, nontender, nondistended, with no rigidity or guarding. Extremities: No cyanosis or edema. Lymphatics: see Neck Exam Skin: No concerning lesions. Musculoskeletal: symmetric strength and muscle tone throughout. Neurologic: Cranial nerves II through XII are grossly intact. No  obvious focalities. Speech is fluent. Coordination is intact. Psychiatric: Judgment and insight are intact. Affect is appropriate.   ECOG = 1  0 - Asymptomatic (Fully active, able to carry on all predisease activities without restriction)  1 - Symptomatic but completely ambulatory (Restricted in physically strenuous activity but ambulatory and able to carry out work of a light or sedentary nature. For example, light housework, office work)  2 - Symptomatic, <50% in bed during the day (Ambulatory and capable of all self care but unable to carry out any work activities. Up and about more than 50% of waking hours)  3 - Symptomatic, >50% in bed, but not bedbound (Capable of only limited self-care, confined to bed or chair 50% or more of waking hours)  4 - Bedbound (Completely disabled. Cannot carry on any self-care. Totally confined to bed or chair)  5 - Death   Eustace Pen MM, Creech RH, Tormey DC, et al. [phone]). "Toxicity and response criteria of the [facility]". [place] Oncol. 5 (6): [phone]  LABORATORY DATA:  No results found for: "WBC", "HGB", "HCT", "MCV", "PLT", "NEUTROABS" No results found for: "NA", "K", "CL", "CO2", "GLUCOSE", "BUN", "CREATININE", "CALCIUM"    RADIOGRAPHY: IR IMAGING GUIDED PORT INSERTION  Result Date: 05/04/2022 INDICATION: 61 year old male with history of esophageal cancer requiring central venous access for chemotherapy administration. EXAM: IMPLANTED PORT A CATH PLACEMENT WITH ULTRASOUND AND FLUOROSCOPIC GUIDANCE COMPARISON:  None Available. MEDICATIONS: None. ANESTHESIA/SEDATION: Moderate (conscious) sedation was employed during this procedure. A total of Versed 1 mg and Fentanyl 50 mcg was administered intravenously. Moderate Sedation Time: 14 minutes. The patient's level of consciousness and vital signs were monitored continuously by radiology nursing throughout the procedure under my direct supervision. CONTRAST:  None FLUOROSCOPY TIME:   6 seconds, 1 mGy COMPLICATIONS: None immediate. PROCEDURE: The procedure, risks, benefits, and alternatives were explained to the patient. Questions regarding the procedure were encouraged and answered. The patient understands and consents to the procedure. The right neck and chest were prepped with chlorhexidine in a sterile fashion, and a sterile drape was applied covering the operative field. Maximum barrier sterile technique with sterile gowns and gloves were used for the procedure. A timeout was performed prior to the initiation of the procedure. Ultrasound was used to examine the jugular vein which was compressible and free of internal echoes. A skin marker was used to demarcate the planned venotomy and port pocket incision sites. Local anesthesia was provided to these sites and the subcutaneous tunnel track with 1% lidocaine with 1:100,000 epinephrine. A small incision was created at the jugular access site and blunt dissection was performed of the subcutaneous tissues. Under ultrasound guidance, the jugular vein was accessed with a 21 ga micropuncture needle and an 0.018" wire was inserted to the superior vena cava. Real-time ultrasound guidance was utilized for vascular access including the acquisition of a permanent ultrasound image documenting patency of the accessed vessel. A 5 Fr micopuncture set was then used, through which a 0.035" Rosen wire was passed under fluoroscopic guidance into the inferior vena cava. An 8 Fr dilator was then placed over the  wire. A subcutaneous port pocket was then created along the upper chest wall utilizing a combination of sharp and blunt dissection. The pocket was irrigated with sterile saline, packed with gauze, and observed for hemorrhage. A single lumen "ISP" sized power injectable port was chosen for placement. The 8 Fr catheter was tunneled from the port pocket site to the venotomy incision. The port was placed in the pocket. The external catheter was trimmed to  appropriate length. The dilator was exchanged for an 8 Fr peel-away sheath under fluoroscopic guidance. The catheter was then placed through the sheath and the sheath was removed. Final catheter positioning was confirmed and documented with a fluoroscopic spot radiograph. The port was accessed with a Huber needle, aspirated, and flushed with heparinized saline. The deep dermal layer of the port pocket incision was closed with interrupted 3-0 Vicryl suture. Dermabond was then placed over the port pocket and neck incisions. The patient tolerated the procedure well without immediate post procedural complication. FINDINGS: After catheter placement, the tip lies within the superior cavoatrial junction. The catheter aspirates and flushes normally and is ready for immediate use. IMPRESSION: Successful placement of a power injectable Port-A-Cath via the right internal jugular vein. The catheter is ready for immediate use. Ruthann Cancer, MD [facility] Electronically Signed   By: Ruthann Cancer M.D.   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  He has met with cardiothoracic surgery and feels would be a good candidate for preoperative radiation along with radiosensitizing chemotherapy.  I would agree with this recommended plan of treatment.  Today, I talked to the patient and his wife about the findings and work-up thus far.  We discussed the natural history of distal esophageal cancer and general treatment, highlighting the role of radiotherapy in the management.  We discussed the available radiation techniques, and focused on the details of logistics and delivery.  We reviewed the anticipated acute and late sequelae associated with radiation in this setting.  The patient was encouraged to ask questions that I answered to the best of my  ability.  A patient consent form was discussed and signed.  We retained a copy for our records.  The patient would like to proceed with radiation and will be scheduled for CT simulation.  PLAN: He will proceed with CT simulation this morning.  Treatments to begin July 27.  Anticipate 5-1/2 weeks of radiation therapy as preoperative treatment.   60 minutes of total time was spent for this patient encounter, including preparation, face-to-face counseling with the patient and coordination of care, physical exam, and documentation of the encounter.   ------------------------------------------------  Blair Promise, PhD, MD  This document serves as a record of services personally performed by Gery Pray, MD. It was created on his behalf by Roney Mans, a trained medical scribe. The creation of this record is based on the scribe's personal observations and the provider's statements to them. This document has been checked and approved by the attending provider.

## 2022-05-09 ENCOUNTER — Other Ambulatory Visit: Payer: Self-pay

## 2022-05-09 ENCOUNTER — Ambulatory Visit
Admission: RE | Admit: 2022-05-09 | Discharge: 2022-05-09 | Disposition: A | Discharge status: Home / Self Care | Payer: No Typology Code available for payment source | Source: Ambulatory Visit | Attending: Radiation Oncology | Admitting: Radiation Oncology

## 2022-05-09 ENCOUNTER — Encounter: Payer: Self-pay | Admitting: Radiation Oncology

## 2022-05-09 VITALS — BP 115/79 | HR 78 | Temp 99.1°F | Resp 18 | Ht 70.0 in | Wt 210.1 lb

## 2022-05-09 DIAGNOSIS — Z803 Family history of malignant neoplasm of breast: Secondary | ICD-10-CM | POA: Insufficient documentation

## 2022-05-09 DIAGNOSIS — Z510 Encounter for antineoplastic radiation therapy: Secondary | ICD-10-CM | POA: Diagnosis not present

## 2022-05-09 DIAGNOSIS — I700 Atherosclerosis of aorta: Secondary | ICD-10-CM | POA: Insufficient documentation

## 2022-05-09 DIAGNOSIS — E041 Nontoxic single thyroid nodule: Secondary | ICD-10-CM | POA: Insufficient documentation

## 2022-05-09 DIAGNOSIS — K760 Fatty (change of) liver, not elsewhere classified: Secondary | ICD-10-CM | POA: Insufficient documentation

## 2022-05-09 DIAGNOSIS — C160 Malignant neoplasm of cardia: Secondary | ICD-10-CM | POA: Insufficient documentation

## 2022-05-09 DIAGNOSIS — Z5111 Encounter for antineoplastic chemotherapy: Secondary | ICD-10-CM | POA: Insufficient documentation

## 2022-05-09 DIAGNOSIS — R911 Solitary pulmonary nodule: Secondary | ICD-10-CM | POA: Insufficient documentation

## 2022-05-10 ENCOUNTER — Telehealth: Payer: Self-pay | Admitting: Oncology

## 2022-05-10 ENCOUNTER — Encounter (INDEPENDENT_AMBULATORY_CARE_PROVIDER_SITE_OTHER): Payer: Self-pay | Admitting: Gastroenterology

## 2022-05-10 ENCOUNTER — Other Ambulatory Visit: Payer: Self-pay

## 2022-05-10 ENCOUNTER — Ambulatory Visit (INDEPENDENT_AMBULATORY_CARE_PROVIDER_SITE_OTHER): Payer: No Typology Code available for payment source | Admitting: Gastroenterology

## 2022-05-10 ENCOUNTER — Other Ambulatory Visit (HOSPITAL_COMMUNITY): Payer: Self-pay

## 2022-05-10 VITALS — BP 114/76 | HR 74 | Temp 98.1°F | Ht 70.0 in | Wt 210.3 lb

## 2022-05-10 DIAGNOSIS — Z510 Encounter for antineoplastic radiation therapy: Secondary | ICD-10-CM | POA: Diagnosis not present

## 2022-05-10 DIAGNOSIS — R1319 Other dysphagia: Secondary | ICD-10-CM

## 2022-05-10 DIAGNOSIS — C160 Malignant neoplasm of cardia: Secondary | ICD-10-CM

## 2022-05-10 NOTE — Telephone Encounter (Signed)
Left a message with new appointment time to start radiation on 05/12/22 at 7:30.

## 2022-05-10 NOTE — Patient Instructions (Signed)
Nice to meet you! I'm glad that swallowing has improved some, please continue with smaller bites, chewing thoroughly and taking sips of liquids between bites to avoid choking. Please let us know if you have new or worsening GI symptoms, at this time we will plan to see you on an as needed basis

## 2022-05-10 NOTE — Progress Notes (Signed)
Referring Provider: Lyman Bishop, DO Primary Care Physician:  Lyman Bishop, DO Primary GI Physician:   Chief Complaint  Patient presents with   Dysphagia    Follow up on dysphagia. Avoids bread, chews throughly now. Takes smaller bites. Starts radiation and chemo this Thursday.    HPI:   [name] [name] is a 61 y.o. male with no past medical history.   Patient presenting today for follow up of dysphagia.   Last seen 02/10/22, at that time, he reported over the previous 10 weeks persistent and recurrent episodes of dysphagia to solids. States it happens more often with bread products. States that he has presented some pain in the upper thoracic midline area when he is swallowing which improves after he drinks water. He has had to vomit 4 times the food as the pain was significant, which actually immediately resolved the symptoms. No heartburn.  -barium esophagram on 01/27/2022 which showed a stricture at the GE junction that did not allow the passage of a 12.5 mm barium tablet.  There was indentation of the intragastric area at the GE junction concerning for mass versus prominence of the GE junction.  There was also a suspected right thyroid mass.  thyroid ultrasound thereafter showed a total of 6 nodules, 1 of which was recommended to have FNA for further evaluation, this was done on 02/17/22 with presence of scant follicular epithelium  He underwent colonoscopy and EGD with findings as below.  -CT chest/abd/pelvis with contrast on 04/01/22 There are thickened folds of the EG junction without discrete focal mass visible. no upstream esophageal dilatation or thickening. 5 mm right upper lobe solitary nodule.  2.4 cm densely rim calcified right thyroid nodule. Mild hepatic steatosis. Constipation and diverticulosis. Aortic and coronary artery atherosclerosis.Solitary subcentimeter too small to characterize left renal hypodensity. Probable cyst.  -PET 04/07/22 Increased metabolic  activity about the GE junction likely the site of the patient's primary tumor. No signs of metastatic disease to the neck, chest, abdomen or pelvis. LEFT thyroid lesion with moderately increased metabolic activity.  EUS: 04/25/22  - Partially obstructing (stenosed to approximately 9 mm - had to use gentle pressure to allow scope to traverse this region (9.8 mm)), malignant esophageal tumor was found at the distal esophagus extending into the gastroesophageal junction and into the cardia. - Gastritis. Biopsied. - No gross lesions in the duodenal bulb, in the first portion of the duodenum and in the second portion of the duodenum. EUS Impression: - A mass was found at the distal esophagus extending into the GE Jxn and the cardia. A tissue diagnosis was obtained prior to this exam which is consistent with adenocarcinoma. The lesion extends through the MP. This was staged T3 N0 Mx by endosonographic criteria. - No malignant-appearing lymph nodes were visualized in the middle paraesophageal mediastinum (level 9M) and lower paraesophageal mediastinum (level 8L).  He is following with Dr. Delton Coombes with oncology with plans to start chemo/radiation in the coming weeks for GE junction adenocarcinoma, signet ring cell, followed by surgical intervention with Dr. Kipp Brood of [facility].  Present:  Today, patient states he has no GI complaints. He is feeling good. Patient states he started walking 7.5 miles in September, previously with little to no physical activity prior to this. Has lost about 60 pounds doing this. Feels that appetite is good. No belly pain, denies rectal bleeding melena, nausea or vomiting. No issues with constipation or diarrhea. Has some minimal dysphagia with breads so he avoids these and  harder veggies, but otherwise no real issues, tries to take small bites, chew well. Denies GERD symptoms. Is not taking Omeprazole currently.  Last EGD:03/25/22 - Partially obstructing, likely malignant  esophageal tumor was found at the gastroesophageal junction. Biopsied. - Normal stomach. - Normal examined duodenum. Last Colonoscopy:03/25/22- One 5 mm polyp in the transverse colon, removed with a cold snare. Resected and retrieved. - The distal rectum and anal verge are normal on retroflexion view.   No past medical history on file.  Past Surgical History:  Procedure Laterality Date   BIOPSY  03/25/2022   Procedure: BIOPSY;  Surgeon: Harvel Quale, MD;  Location: [facility];  Service: Gastroenterology;;  GE junction;   BIOPSY  04/25/2022   Procedure: BIOPSY;  Surgeon: Irving Copas., MD;  Location: [facility];  Service: Gastroenterology;;   COLONOSCOPY     COLONOSCOPY WITH PROPOFOL N/A 03/25/2022   Procedure: COLONOSCOPY WITH PROPOFOL;  Surgeon: Harvel Quale, MD;  Location: [facility];  Service: Gastroenterology;  Laterality: N/A;  945 ASA 1   ESOPHAGOGASTRODUODENOSCOPY (EGD) WITH PROPOFOL N/A 03/25/2022   Procedure: ESOPHAGOGASTRODUODENOSCOPY (EGD) WITH PROPOFOL;  Surgeon: Harvel Quale, MD;  Location: [facility];  Service: Gastroenterology;  Laterality: N/A;   ESOPHAGOGASTRODUODENOSCOPY (EGD) WITH PROPOFOL N/A 04/25/2022   Procedure: ESOPHAGOGASTRODUODENOSCOPY (EGD) WITH PROPOFOL;  Surgeon: Rush Landmark Telford Nab., MD;  Location: [facility];  Service: Gastroenterology;  Laterality: N/A;   IR IMAGING GUIDED PORT INSERTION  05/04/2022   UPPER ESOPHAGEAL ENDOSCOPIC ULTRASOUND (EUS) N/A 04/25/2022   Procedure: UPPER ESOPHAGEAL ENDOSCOPIC ULTRASOUND (EUS);  Surgeon: Irving Copas., MD;  Location: [facility];  Service: Gastroenterology;  Laterality: N/A;    No current outpatient medications on file.   No current facility-administered medications for this visit.    Allergies as of 05/10/2022   (No Known Allergies)    Family History  Problem Relation Age of Onset   Cancer Maternal Grandmother        Breast cancer   Cancer  Other        Maternal Cousin - breast cancer    Social History   Socioeconomic History   Marital status: Married    Spouse name: Not on file   Number of children: Not on file   Years of education: Not on file   Highest education level: Not on file  Occupational History   Not on file  Tobacco Use   Smoking status: Never    Passive exposure: Past   Smokeless tobacco: Never  Vaping Use   Vaping Use: Never used  Substance and Sexual Activity   Alcohol use: Yes    Comment: 1 daily   Drug use: Never   Sexual activity: Yes  Other Topics Concern   Not on file  Social History Narrative   Not on file   Social Determinants of Health   Financial Resource Strain: Not on file  Food Insecurity: Not on file  Transportation Needs: Not on file  Physical Activity: Not on file  Stress: Not on file  Social Connections: Not on file   Review of systems General: negative for malaise, night sweats, fever, chills, weight loss Neck: Negative for lumps, goiter, pain and significant neck swelling Resp: Negative for cough, wheezing, dyspnea at rest CV: Negative for chest pain, leg swelling, palpitations, orthopnea GI: denies melena, hematochezia, nausea, vomiting, diarrhea, constipation, dysphagia, odyonophagia, early satiety or unintentional weight loss.  MSK: Negative for joint pain or swelling, back pain, and muscle pain. Derm: Negative for  itching or rash Psych: Denies depression, anxiety, memory loss, confusion. No homicidal or suicidal ideation.  Heme: Negative for prolonged bleeding, bruising easily, and swollen nodes. Endocrine: Negative for cold or heat intolerance, polyuria, polydipsia and goiter. Neuro: negative for tremor, gait imbalance, syncope and seizures. The remainder of the review of systems is noncontributory.  Physical Exam: BP 114/76 (BP Location: Left Arm, Patient Position: Sitting, Cuff Size: Large)   Pulse 74   Temp 98.1 F (36.7 C) (Oral)   Ht '5\' 10"'$  (1.778 m)    Wt 210 lb 4.8 oz (95.4 kg)   BMI 30.17 kg/m  General:   Alert and oriented. No distress noted. Pleasant and cooperative.  Head:  Normocephalic and atraumatic. Eyes:  Conjuctiva clear without scleral icterus. Mouth:  Oral mucosa pink and moist. Good dentition. No lesions. Heart: Normal rate and rhythm, s1 and s2 heart sounds present.  Lungs: Clear lung sounds in all lobes. Respirations equal and unlabored. Abdomen:  +BS, soft, non-tender and non-distended. No rebound or guarding. No HSM or masses noted. Derm: No palmar erythema or jaundice Msk:  Symmetrical without gross deformities. Normal posture. Extremities:  Without edema. Neurologic:  Alert and  oriented x4 Psych:  Alert and cooperative. Normal mood and affect.  Invalid input(s): "6 MONTHS"   ASSESSMENT: [name] [name] is a 61 y.o. male presenting today for follow up of dysphagia, recent diagnosis of stage III GE junction adenocarcinoma, signet ring cell.  Patient has no GI complaints today. Feels that swallowing is doing okay. Avoiding breads and rougher veggies, chewing well, taking smaller bites. Has no issues with pills or liquids. No red flag symptoms. Patient denies melena, hematochezia, nausea, vomiting, diarrhea, constipation, dysphagia, odyonophagia, early satiety. Overall he reports feeling well. He is starting chemo/radiation in the coming weeks, will have surgical intervention following this. He will make me aware of any new or worsening GI symptoms.    PLAN:  Continue with chewing precautions  2. Patient to make me aware of new/worsening GI symptoms   All questions were answered, patient verbalized understanding and is in agreement with plan as outlined above.   Follow Up: For now will plan for f/u as needed  Tilia Faso L. Alver Sorrow, MSN, APRN, AGNP-C Adult-Gerontology Nurse Practitioner [facility]

## 2022-05-10 NOTE — Progress Notes (Signed)
Pharmacist Chemotherapy Monitoring - Initial Assessment    Anticipated start date: 05/12/22   The following has been reviewed per standard work regarding the patient's treatment regimen: The patient's diagnosis, treatment plan and drug doses, and organ/hematologic function Lab orders and baseline tests specific to treatment regimen  The treatment plan start date, drug sequencing, and pre-medications Prior authorization status  Patient's documented medication list, including drug-drug interaction screen and prescriptions for anti-emetics and supportive care specific to the treatment regimen The drug concentrations, fluid compatibility, administration routes, and timing of the medications to be used The patient's access for treatment and lifetime cumulative dose history, if applicable  The patient's medication allergies and previous infusion related reactions, if applicable   Changes made to treatment plan:  treatment plan date  Follow up needed:  N/A   Wynona Neat, [facility], 05/10/2022  12:58 PM

## 2022-05-11 ENCOUNTER — Encounter (HOSPITAL_COMMUNITY): Payer: Self-pay | Admitting: Hematology

## 2022-05-11 ENCOUNTER — Encounter (HOSPITAL_COMMUNITY): Payer: Self-pay

## 2022-05-11 DIAGNOSIS — Z95828 Presence of other vascular implants and grafts: Secondary | ICD-10-CM

## 2022-05-11 HISTORY — DX: Presence of other vascular implants and grafts: Z95.828

## 2022-05-11 MED ORDER — PROCHLORPERAZINE MALEATE 10 MG PO TABS: 10.0000 mg | ORAL_TABLET | Freq: Four times a day (QID) | ORAL | 3 refills | Status: DC | PRN

## 2022-05-11 MED ORDER — LIDOCAINE-PRILOCAINE 2.5-2.5 % EX CREA: TOPICAL_CREAM | CUTANEOUS | 3 refills | Status: DC

## 2022-05-11 NOTE — Progress Notes (Signed)
Patient scheduled to start treatment concurrently with radiation therapy on 05/12/22. Patient made aware of his appts. Patient offered chemotherapy education session, which he has declined. Chemotherapy education packet to be provided to patient on D1C1.

## 2022-05-11 NOTE — Patient Instructions (Addendum)
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 You will be treated in the clinic weekly in conjunction with radiation therapy with a combination of chemotherapy drugs. Those drugs are Taxol and carboplatin. The intent of treatment is cure. You will see the doctor regularly throughout treatment.  We will obtain blood work from you prior to every treatment and monitor your results to make sure it is safe to give your treatment. The doctor monitors your response to treatment by the way you are feeling, your blood work, and by obtaining scans periodically. There will be wait times while you are here for treatment. It will take about 30 minutes to 1 hour for your lab work to result.  Then there will be wait times while pharmacy mixes your medications.    Medications you will receive in the clinic prior to your chemotherapy medications:   Aloxi:  ALOXI is used in adults to help prevent the nausea and vomiting that happens with certain chemotherapy drugs.  Aloxi is a long acting medication, and will remain in your system for about 2 days.    Dexamethasone:  This is a steroid given prior to chemotherapy to help prevent allergic reactions; it may also help prevent and control nausea and diarrhea.    Pepcid:  This medication is a histamine blocker that helps prevent and allergic reaction to your chemotherapy.    Benadryl:  This is a histamine blocker (different from the Pepcid) that helps prevent allergic/infusion reactions to your chemotherapy. This medication may cause dizziness/drowsiness.      Paclitaxel (Taxol)  About This Drug Paclitaxel is a drug used to treat cancer. It is given in the vein (IV).  This will take 1 hour to infuse.  This first infusion will take longer to infuse because it is increased slowly to monitor for reactions.  The nurse will be in the room with you for the first 15 minutes of the first  infusion.  Possible Side Effects   Hair loss. Hair loss is often temporary, although with certain medicine, hair loss can sometimes be permanent. Hair loss may happen suddenly or gradually. If you lose hair, you may lose it from your head, face, armpits, pubic area, chest, and/or legs. You may also notice your hair getting thin.   Swelling of your legs, ankles and/or feet (edema)   Flushing   Nausea and throwing up (vomiting)   Loose bowel movements (diarrhea)   Bone marrow depression. This is a decrease in the number of white blood cells, red blood cells, and platelets. This may raise your risk of infection, make you tired and weak (fatigue), and raise your risk of bleeding.   Effects on the nerves are called peripheral neuropathy. You may feel numbness, tingling, or pain in your hands and feet. It may be hard for you to button your clothes, open jars, or walk as usual. The effect on the nerves may get worse with more doses of the drug. These effects get better in some people after the drug is stopped but it does not get better in all people.   Changes in your liver function   Bone, joint and muscle pain   Abnormal EKG   Allergic reaction: Allergic reactions, including anaphylaxis are rare but may happen in some patients. Signs of allergic reaction to this drug may be swelling of the face, feeling like your tongue or throat are swelling, trouble breathing, rash, itching, fever,  chills, feeling dizzy, and/or feeling that your heart is beating in a fast or not normal way. If this happens, do not take another dose of this drug. You should get urgent medical treatment.   Infection   Changes in your kidney function.  Note: Each of the side effects above was reported in 20% or greater of patients treated with paclitaxel. Not all possible side effects are included above.  Warnings and Precautions   Severe allergic reactions   Severe bone marrow depression  Treating Side Effects   To  help with hair loss, wash with a mild shampoo and avoid washing your hair every day.   Avoid rubbing your scalp, instead, pat your hair or scalp dry   Avoid coloring your hair   Limit your use of hair spray, electric curlers, blow dryers, and curling irons.   If you are interested in getting a wig, talk to your nurse. You can also call the [place] at 800-ACS-2345 to find out information about the "Look Good, Feel Better" program close to where you live. It is a free program where women getting chemotherapy can learn about wigs, turbans and scarves as well as makeup techniques and skin and nail care.   Ask your doctor or nurse about medicines that are available to help stop or lessen diarrhea and/or nausea.   To help with nausea and vomiting, eat small, frequent meals instead of three large meals a day. Choose foods and drinks that are at room temperature. Ask your nurse or doctor about other helpful tips and medicine that is available to help or stop lessen these symptoms.   If you get diarrhea, eat low-fiber foods that are high in protein and calories and avoid foods that can irritate your digestive tracts or lead to cramping. Ask your nurse or doctor about medicine that can lessen or stop your diarrhea.   Mouth care is very important. Your mouth care should consist of routine, gentle cleaning of your teeth or dentures and rinsing your mouth with a mixture of 1/2 teaspoon of salt in 8 ounces of water or  teaspoon of baking soda in 8 ounces of water. This should be done at least after each meal and at bedtime.   If you have mouth sores, avoid mouthwash that has alcohol. Also avoid alcohol and smoking because they can bother your mouth and throat.   Drink plenty of fluids (a minimum of eight glasses per day is recommended).   Take your temperature as your doctor or nurse tells you, and whenever you feel like you may have a fever.   Talk to your doctor or nurse about  precautions you can take to avoid infections and bleeding.   Be careful when cooking, walking, and handling sharp objects and hot liquids.  Food and Drug Interactions   There are no known interactions of paclitaxel with food.   This drug may interact with other medicines. Tell your doctor and pharmacist about all the medicines and dietary supplements (vitamins, minerals, herbs and others) that you are taking at this time.   The safety and use of dietary supplements and alternative diets are often not known. Using these might affect your cancer or interfere with your treatment. Until more is known, you should not use dietary supplements or alternative diets without your cancer doctor's help.  When to Call the Doctor  Call your doctor or nurse if you have any of the following symptoms and/or any new or unusual symptoms:  Fever of 100.4 F (38 C) or above   Chills   Redness, pain, warmth, or swelling at the IV site during the infusion   Signs of allergic reaction: swelling of the face, feeling like your tongue or throat are swelling, trouble breathing, rash, itching, fever, chills, feeling dizzy, and/or feeling that your heart is beating in a fast or not normal way   Feeling that your heart is beating in a fast or not normal way (palpitations)   Weight gain of 5 pounds in one week (fluid retention)   Decreased urine or very dark urine   Signs of liver problems: dark urine, pale bowel movements, bad stomach pain, feeling very tired and weak, unusual  itching, or yellowing of the eyes or skin   Heavy menstrual period that lasts longer than normal   Easy bruising or bleeding   Nausea that stops you from eating or drinking, and/or that is not relieved by prescribed medicines.   Loose bowel movements (diarrhea) more than 4 times a day or diarrhea with weakness or lightheadedness   Pain in your mouth or throat that makes it hard to eat or drink   Lasting loss of appetite or rapid  weight loss of five pounds in a week   Signs of peripheral neuropathy: numbness, tingling, or decreased feeling in fingers or toes; trouble walking or changes in the way you walk; or feeling clumsy when buttoning clothes, opening jars, or other routine activities   Joint and muscle pain that is not relieved by prescribed medicines   Extreme fatigue that interferes with normal activities   While you are getting this drug, please tell your nurse right away if you have any pain, redness, or swelling at the site of the IV infusion.   If you think you are pregnant.  Reproduction Warnings   Pregnancy warning: This drug may have harmful effects on the unborn child, it is recommended that effective methods of birth control should be used during your cancer treatment. Let your doctor know right away if you think you may be pregnant.   Breast feeding warning: Women should not breast feed during treatment because this drug could enter the breastmilk and cause harm to a breast feeding baby.   Carboplatin (Paraplatin, CBDCA)  About This Drug  Carboplatin is used to treat cancer. It is given in the vein (IV).  It will take 30 minutes to infuse.   Possible Side Effects   Bone marrow suppression. This is a decrease in the number of white blood cells, red blood cells, and platelets. This may raise your risk of infection, make you tired and weak (fatigue), and raise your risk of bleeding.   Nausea and vomiting (throwing up)   Weakness   Changes in your liver function   Changes in your kidney function   Electrolyte changes   Pain  Note: Each of the side effects above was reported in 20% or greater of patients treated with carboplatin. Not all possible side effects are included above.   Warnings and Precautions   Severe bone marrow suppression   Allergic reactions, including anaphylaxis are rare but may happen in some patients. Signs of allergic reaction to this drug may be swelling of  the face, feeling like your tongue or throat are swelling, trouble breathing, rash, itching, fever, chills, feeling dizzy, and/or feeling that your heart is beating in a fast or not normal way. If this happens, do not take another dose of this drug. You should get  urgent medical treatment.   Severe nausea and vomiting   Effects on the nerves are called peripheral neuropathy. This risk is increased if you are over the age of 81 or if you have received other medicine with risk of peripheral neuropathy. You may feel numbness, tingling, or pain in your hands and feet. It may be hard for you to button your clothes, open jars, or walk as usual. The effect on the nerves may get worse with more doses of the drug. These effects get better in some people after the drug is stopped but it does not get better in all people.   Blurred vision, loss of vision or other changes in eyesight   Decreased hearing   - Skin and tissue irritation including redness, pain, warmth, or swelling at the IV site if the drug leaks out of the vein and into nearby tissue.   Severe changes in your kidney function, which can cause kidney failure   Severe changes in your liver function, which can cause liver failure  Note: Some of the side effects above are very rare. If you have concerns and/or questions, please discuss them with your medical team.   Important Information   This drug may be present in the saliva, tears, sweat, urine, stool, vomit, semen, and vaginal secretions. Talk to your doctor and/or your nurse about the necessary precautions to take during this time.   Treating Side Effects   Manage tiredness by pacing your activities for the day.   Be sure to include periods of rest between energy-draining activities.   To decrease the risk of infection, wash your hands regularly.   Avoid close contact with people who have a cold, the flu, or other infections.   Take your temperature as your doctor or nurse tells  you, and whenever you feel like you may have a fever.   To help decrease the risk of bleeding, use a soft toothbrush. Check with your nurse before using dental floss.   Be very careful when using knives or tools.   Use an electric shaver instead of a razor.   Drink plenty of fluids (a minimum of eight glasses per day is recommended).   If you throw up or have loose bowel movements, you should drink more fluids so that you do not become dehydrated (lack of water in the body from losing too much fluid).   To help with nausea and vomiting, eat small, frequent meals instead of three large meals a day. Choose foods and drinks that are at room temperature. Ask your nurse or doctor about other helpful tips and medicine that is available to help stop or lessen these symptoms.   If you have numbness and tingling in your hands and feet, be careful when cooking, walking, and handling sharp objects and hot liquids.   Keeping your pain under control is important to your well-being. Please tell your doctor or nurse if you are experiencing pain.   Food and Drug Interactions   There are no known interactions of carboplatin with food.   This drug may interact with other medicines. Tell your doctor and pharmacist about all the prescription and over-the-counter medicines and dietary supplements (vitamins, minerals, herbs and others) that you are taking at this time. Also, check with your doctor or pharmacist before starting any new prescription or over-the-counter medicines, or dietary supplements to make sure that there are no interactions.   When to Call the Doctor  Call your doctor or nurse if you  have any of these symptoms and/or any new or unusual symptoms:   Fever of 100.4 F (38 C) or higher   Chills   Tiredness that interferes with your daily activities   Feeling dizzy or lightheaded   Easy bleeding or bruising   Nausea that stops you from eating or drinking and/or is not relieved by  prescribed medicines   Throwing up   Blurred vision or other changes in eyesight   Decrease in hearing or ringing in the ear   Signs of allergic reaction: swelling of the face, feeling like your tongue or throat are swelling, trouble breathing, rash, itching, fever, chills, feeling dizzy, and/or feeling that your heart is beating in a fast or not normal way. If this happens, call 911 for emergency care.   Signs of possible liver problems: dark urine, pale bowel movements, bad stomach pain, feeling very tired and weak, unusual itching, or yellowing of the eyes or skin   Decreased urine, or very dark urine   Numbness, tingling, or pain in your hands and feet   Pain that does not go away or is not relieved by prescribed medicine   While you are getting this drug, please tell your nurse right away if you have any pain, redness, or swelling at the site of the IV infusion, or if you have any new onset of symptoms, or if you just feel "different" from before when the infusion was started.   Reproduction Warnings   Pregnancy warning: This drug may have harmful effects on the unborn baby. Women of child bearing potential should use effective methods of birth control during your cancer treatment. Let your doctor know right away if you think you may be pregnant.   Breastfeeding warning: It is not known if this drug passes into breast milk. For this reason, women should not breastfeed during treatment because this drug could enter the breast milk and cause harm to a breastfeeding baby.   Fertility warning: Human fertility studies have not been done with this drug. Talk with your doctor or nurse if you plan to have children. Ask for information on sperm or egg banking.   SELF CARE ACTIVITIES WHILE RECEIVING CHEMOTHERAPY:  Hydration Increase your fluid intake and drink at least 8 to 12 cups (64 ounces) of water/decaffeinated beverages per day after treatment. You can still have your cup of  coffee or soda but these beverages do not count as part of your 8 to 12 cups that you need to drink daily. No alcohol intake.  Medications Continue taking your normal prescription medication as prescribed.  If you start any new herbal or new supplements please let us know first to make sure it is safe.  Mouth Care Have teeth cleaned professionally before starting treatment. Keep dentures and partial plates clean. Use soft toothbrush and do not use mouthwashes that contain alcohol. Biotene is a good mouthwash that is available at most pharmacies or may be ordered by calling [phone]. Use warm salt water gargles (1 teaspoon salt per 1 quart warm water) before and after meals and at bedtime. If you need dental work, please let the doctor know before you go for your appointment so that we can coordinate the best possible time for you in regards to your chemo regimen. You need to also let your dentist know that you are actively taking chemo. We may need to do labs prior to your dental appointment.  Skin Care Always use sunscreen that has not expired and with  SPF (Sun Protection Factor) of 50 or higher. Wear hats to protect your head from the sun. Remember to use sunscreen on your hands, ears, face, & feet.  Use good moisturizing lotions such as udder cream, eucerin, or even Vaseline. Some chemotherapies can cause dry skin, color changes in your skin and nails.    Avoid long, hot showers or baths. Use gentle, fragrance-free soaps and laundry detergent. Use moisturizers, preferably creams or ointments rather than lotions because the thicker consistency is better at preventing skin dehydration. Apply the cream or ointment within 15 minutes of showering. Reapply moisturizer at night, and moisturize your hands every time after you wash them.  Hair Loss (if your doctor says your hair will fall out)  If your doctor says that your hair is likely to fall out, decide before you begin chemo whether you want  to wear a wig. You may want to shop before treatment to match your hair color. Hats, turbans, and scarves can also camouflage hair loss, although some people prefer to leave their heads uncovered. If you go bare-headed outdoors, be sure to use sunscreen on your scalp. Cut your hair short. It eases the inconvenience of shedding lots of hair, but it also can reduce the emotional impact of watching your hair fall out. Don't perm or color your hair during chemotherapy. Those chemical treatments are already damaging to hair and can enhance hair loss. Once your chemo treatments are done and your hair has grown back, it's OK to resume dyeing or perming hair.  With chemotherapy, hair loss is almost always temporary. But when it grows back, it may be a different color or texture. In older adults who still had hair color before chemotherapy, the new growth may be completely gray.  Often, new hair is very fine and soft.  Infection Prevention Please wash your hands for at least 30 seconds using warm soapy water. Handwashing is the #1 way to prevent the spread of germs. Stay away from sick people or people who are getting over a cold. If you develop respiratory systems such as green/yellow mucus production or productive cough or persistent cough let us know and we will see if you need an antibiotic. It is a good idea to keep a pair of gloves on when going into grocery stores/[employer] to decrease your risk of coming into contact with germs on the carts, etc. Carry alcohol hand gel with you at all times and use it frequently if out in public. If your temperature reaches 100.4 or higher please call the clinic and let us know.  If it is after hours or on the weekend please go to the ER if your temperature is over 100.4.  Please have your own personal thermometer at home to use.    Sex and bodily fluids If you are going to have sex, a condom must be used to protect the person that isn't taking chemotherapy. Chemo can  decrease your libido (sex drive). For a few days after chemotherapy, chemotherapy can be excreted through your bodily fluids.  When using the toilet please close the lid and flush the toilet twice.  Do this for a few day after you have had chemotherapy.   Effects of chemotherapy on your sex life Some changes are simple and won't last long. They won't affect your sex life permanently.  Sometimes you may feel: too tired not strong enough to be very active sick or sore  not in the mood anxious or low  Your anxiety  might not seem related to sex. For example, you may be worried about the cancer and how your treatment is going. Or you may be worried about money, or about how you family are coping with your illness.  These things can cause stress, which can affect your interest in sex. It's important to talk to your partner about how you feel.  Remember - the changes to your sex life don't usually last long. There's usually no medical reason to stop having sex during chemo. The drugs won't have any long term physical effects on your performance or enjoyment of sex. Cancer can't be passed on to your partner during sex  Contraception It's important to use reliable contraception during treatment. Avoid getting pregnant while you or your partner are having chemotherapy. This is because the drugs may harm the baby. Sometimes chemotherapy drugs can leave a man or woman infertile.  This means you would not be able to have children in the future. You might want to talk to someone about permanent infertility. It can be very difficult to learn that you may no longer be able to have children. Some people find counselling helpful. There might be ways to preserve your fertility, although this is easier for men than for women. You may want to speak to a fertility expert. You can talk about sperm banking or harvesting your eggs. You can also ask about other fertility options, such as donor eggs. If you have or have had  breast cancer, your doctor might advise you not to take the contraceptive pill. This is because the hormones in it might affect the cancer. It is not known for sure whether or not chemotherapy drugs can be passed on through semen or secretions from the vagina. Because of this some doctors advise people to use a barrier method if you have sex during treatment. This applies to vaginal, anal or oral sex. Generally, doctors advise a barrier method only for the time you are actually having the treatment and for about a week after your treatment. Advice like this can be worrying, but this does not mean that you have to avoid being intimate with your partner. You can still have close contact with your partner and continue to enjoy sex.  Animals If you have cats or birds we just ask that you not change the litter or change the cage.  Please have someone else do this for you while you are on chemotherapy.   Food Safety During and After Cancer Treatment Food safety is important for people both during and after cancer treatment. Cancer and cancer treatments, such as chemotherapy, radiation therapy, and stem cell/bone marrow transplantation, often weaken the immune system. This makes it harder for your body to protect itself from foodborne illness, also called food poisoning. Foodborne illness is caused by eating food that contains harmful bacteria, parasites, or viruses.  Foods to avoid Some foods have a higher risk of becoming tainted with bacteria. These include: Unwashed fresh fruit and vegetables, especially leafy vegetables that can hide dirt and other contaminants Raw sprouts, such as alfalfa sprouts Raw or undercooked beef, especially ground beef, or other raw or undercooked meat and poultry Fatty, fried, or spicy foods immediately before or after treatment.  These can sit heavy on your stomach and make you feel nauseous. Raw or undercooked shellfish, such as oysters. Sushi and sashimi, which often  contain raw fish.  Unpasteurized beverages, such as unpasteurized fruit juices, raw milk, raw yogurt, or cider Undercooked eggs, such as  soft boiled, over easy, and poached; raw, unpasteurized eggs; or foods made with raw egg, such as homemade raw cookie dough and homemade mayonnaise  Simple steps for food safety  Shop smart. Do not buy food stored or displayed in an unclean area. Do not buy bruised or damaged fruits or vegetables. Do not buy cans that have cracks, dents, or bulges. Pick up foods that can spoil at the end of your shopping trip and store them in a cooler on the way home.  Prepare and clean up foods carefully. Rinse all fresh fruits and vegetables under running water, and dry them with a clean towel or paper towel. Clean the top of cans before opening them. After preparing food, wash your hands for 20 seconds with hot water and soap. Pay special attention to areas between fingers and under nails. Clean your utensils and dishes with hot water and soap. Disinfect your kitchen and cutting boards using 1 teaspoon of liquid, unscented bleach mixed into 1 quart of water.    Dispose of old food. Eat canned and packaged food before its expiration date (the "use by" or "best before" date). Consume refrigerated leftovers within 3 to 4 days. After that time, throw out the food. Even if the food does not smell or look spoiled, it still may be unsafe. Some bacteria, such as Listeria, can grow even on foods stored in the refrigerator if they are kept for too long.  Take precautions when eating out. At restaurants, avoid buffets and salad bars where food sits out for a long time and comes in contact with many people. Food can become contaminated when someone with a virus, often a norovirus, or another "bug" handles it. Put any leftover food in a "to-go" container yourself, rather than having the server do it. And, refrigerate leftovers as soon as you get home. Choose restaurants that are  clean and that are willing to prepare your food as you order it cooked.   AT HOME MEDICATIONS:                                                                                                                                                                Compazine/Prochlorperazine '10mg'$  tablet. Take 1 tablet every 6 hours as needed for nausea/vomiting. (This can make you sleepy)   EMLA cream. Apply a quarter size amount to port site 1 hour prior to chemo. Do not rub in. Cover with plastic wrap.    Diarrhea Sheet   If you are having loose stools/diarrhea, please purchase Imodium and begin taking as outlined:  At the first sign of poorly formed or loose stools you should begin taking Imodium (loperamide) 2 mg capsules.  Take two tablets ('4mg'$ ) followed by one tablet ('2mg'$ ) every 2 hours - DO NOT  EXCEED 8 tablets in 24 hours.  If it is bedtime and you are having loose stools, take 2 tablets at bedtime, then 2 tablets every 4 hours until morning.   Always call the [facility] if you are having loose stools/diarrhea that you can't get under control.  Loose stools/diarrhea leads to dehydration (loss of water) in your body.  We have other options of trying to get the loose stools/diarrhea to stop but you must let us know!   Constipation Sheet  Colace - 100 mg capsules - take 2 capsules daily.  If this doesn't help then you can increase to 2 capsules twice daily.  Please call if the above does not work for you. Do not go more than 2 days without a bowel movement.  It is very important that you do not become constipated.  It will make you feel sick to your stomach (nausea) and can cause abdominal pain and vomiting.  Nausea Sheet   Compazine/Prochlorperazine '10mg'$  tablet. Take 1 tablet every 6 hours as needed for nausea/vomiting (This can make you drowsy).  If you are having persistent nausea (nausea that does not stop) please call the [facility] and let us know the amount of nausea that you are  experiencing.  If you begin to vomit, you need to call the [facility] and if it is the weekend and you have vomited more than one time and can't get it to stop-go to the Emergency Room.  Persistent nausea/vomiting can lead to dehydration (loss of fluid in your body) and will make you feel very weak and unwell. Ice chips, sips of clear liquids, foods that are at room temperature, crackers, and toast tend to be better tolerated.   SYMPTOMS TO REPORT AS SOON AS POSSIBLE AFTER TREATMENT:  FEVER GREATER THAN 100.4 F  CHILLS WITH OR WITHOUT FEVER  NAUSEA AND VOMITING THAT IS NOT CONTROLLED WITH YOUR NAUSEA MEDICATION  UNUSUAL SHORTNESS OF BREATH  UNUSUAL BRUISING OR BLEEDING  TENDERNESS IN MOUTH AND THROAT WITH OR WITHOUT PRESENCE OF ULCERS  URINARY PROBLEMS  BOWEL PROBLEMS  UNUSUAL RASH      Wear comfortable clothing and clothing appropriate for easy access to any Portacath or PICC line. Let us know if there is anything that we can do to make your therapy better!    What to do if you need assistance after hours or on the weekends: CALL [phone].  HOLD on the line, do not hang up.  You will hear multiple messages but at the end you will be connected with a nurse triage line.  They will contact the doctor if necessary.  Most of the time they will be able to assist you.  Do not call the hospital operator.      I have been informed and understand all of the instructions given to me and have received a copy. I have been instructed to call the clinic [phone] or my family physician as soon as possible for continued medical care, if indicated. I do not have any more questions at this time but understand that I may call the [place] [facility]  or the Patient Navigator at [phone] during office hours should I have questions or need assistance in obtaining follow-up care.

## 2022-05-12 ENCOUNTER — Inpatient Hospital Stay (HOSPITAL_COMMUNITY): Payer: No Typology Code available for payment source

## 2022-05-12 ENCOUNTER — Encounter (HOSPITAL_COMMUNITY): Payer: Self-pay | Admitting: Hematology

## 2022-05-12 ENCOUNTER — Encounter (HOSPITAL_COMMUNITY): Payer: Self-pay

## 2022-05-12 ENCOUNTER — Other Ambulatory Visit: Payer: Self-pay

## 2022-05-12 ENCOUNTER — Inpatient Hospital Stay (HOSPITAL_BASED_OUTPATIENT_CLINIC_OR_DEPARTMENT_OTHER): Payer: No Typology Code available for payment source | Admitting: Hematology

## 2022-05-12 ENCOUNTER — Inpatient Hospital Stay (HOSPITAL_COMMUNITY): Payer: No Typology Code available for payment source | Admitting: Licensed Clinical Social Worker

## 2022-05-12 ENCOUNTER — Other Ambulatory Visit (HOSPITAL_COMMUNITY): Payer: No Typology Code available for payment source

## 2022-05-12 ENCOUNTER — Ambulatory Visit
Admission: RE | Admit: 2022-05-12 | Discharge: 2022-05-12 | Disposition: A | Discharge status: Home / Self Care | Payer: No Typology Code available for payment source | Source: Ambulatory Visit | Attending: Radiation Oncology | Admitting: Radiation Oncology

## 2022-05-12 VITALS — BP 110/78 | HR 55 | Temp 98.4°F | Resp 18

## 2022-05-12 VITALS — BP 104/78 | HR 64 | Temp 97.6°F | Resp 16 | Ht 70.0 in | Wt 208.7 lb

## 2022-05-12 DIAGNOSIS — Z95828 Presence of other vascular implants and grafts: Secondary | ICD-10-CM

## 2022-05-12 DIAGNOSIS — C160 Malignant neoplasm of cardia: Secondary | ICD-10-CM

## 2022-05-12 DIAGNOSIS — Z510 Encounter for antineoplastic radiation therapy: Secondary | ICD-10-CM | POA: Diagnosis not present

## 2022-05-12 LAB — CBC WITH DIFFERENTIAL/PLATELET
Abs Immature Granulocytes: 0.01 10*3/uL (ref 0.00–0.07)
Basophils Absolute: 0 10*3/uL (ref 0.0–0.1)
Basophils Relative: 0 %
Eosinophils Absolute: 0.3 10*3/uL (ref 0.0–0.5)
Eosinophils Relative: 5 %
HCT: 40.8 % (ref 39.0–52.0)
Hemoglobin: 13.5 g/dL (ref 13.0–17.0)
Immature Granulocytes: 0 %
Lymphocytes Relative: 25 %
Lymphs Abs: 1.2 10*3/uL (ref 0.7–4.0)
MCH: 29.6 pg (ref 26.0–34.0)
MCHC: 33.1 g/dL (ref 30.0–36.0)
MCV: 89.5 fL (ref 80.0–100.0)
Monocytes Absolute: 0.4 10*3/uL (ref 0.1–1.0)
Monocytes Relative: 9 %
Neutro Abs: 2.8 10*3/uL (ref 1.7–7.7)
Neutrophils Relative %: 61 %
Platelets: 183 10*3/uL (ref 150–400)
RBC: 4.56 MIL/uL (ref 4.22–5.81)
RDW: 13.8 % (ref 11.5–15.5)
WBC: 4.6 10*3/uL (ref 4.0–10.5)
nRBC: 0 % (ref 0.0–0.2)

## 2022-05-12 LAB — RAD ONC ARIA SESSION SUMMARY
Course Elapsed Days: 0
Plan Fractions Treated to Date: 1
Plan Prescribed Dose Per Fraction: 1.8 Gy
Plan Total Fractions Prescribed: 25
Plan Total Prescribed Dose: 45 Gy
Reference Point Dosage Given to Date: 1.8 Gy
Reference Point Session Dosage Given: 1.8 Gy
Session Number: 1

## 2022-05-12 LAB — COMPREHENSIVE METABOLIC PANEL
ALT: 13 U/L (ref 0–44)
AST: 19 U/L (ref 15–41)
Albumin: 3.8 g/dL (ref 3.5–5.0)
Alkaline Phosphatase: 60 U/L (ref 38–126)
Anion gap: 6 (ref 5–15)
BUN: 15 mg/dL (ref 8–23)
CO2: 27 mmol/L (ref 22–32)
Calcium: 9.3 mg/dL (ref 8.9–10.3)
Chloride: 105 mmol/L (ref 98–111)
Creatinine, Ser: 0.88 mg/dL (ref 0.61–1.24)
GFR, Estimated: >60 mL/min (ref >60)
Glucose, Bld: 99 mg/dL (ref 70–99)
Potassium: 3.9 mmol/L (ref 3.5–5.1)
Sodium: 138 mmol/L (ref 135–145)
Total Bilirubin: 1.1 mg/dL (ref 0.3–1.2)
Total Protein: 6.8 g/dL (ref 6.5–8.1)

## 2022-05-12 LAB — MAGNESIUM: Magnesium: 2 mg/dL (ref 1.7–2.4)

## 2022-05-12 MED ORDER — SODIUM CHLORIDE 0.9 % IV SOLN
283.4000 mg | Freq: Once | INTRAVENOUS | Status: AC
  Administered 2022-05-12: 280 mg via INTRAVENOUS
  Filled 2022-05-12: qty 28

## 2022-05-12 MED ORDER — SODIUM CHLORIDE 0.9% FLUSH
10.0000 mL | INTRAVENOUS | Status: DC | PRN
  Administered 2022-05-12: 10 mL

## 2022-05-12 MED ORDER — HEPARIN SOD (PORK) LOCK FLUSH 100 UNIT/ML IV SOLN
500.0000 [IU] | Freq: Once | INTRAVENOUS | Status: AC | PRN
  Administered 2022-05-12: 500 [IU]

## 2022-05-12 MED ORDER — SODIUM CHLORIDE 0.9 % IV SOLN
10.0000 mg | Freq: Once | INTRAVENOUS | Status: AC
  Administered 2022-05-12: 10 mg via INTRAVENOUS
  Filled 2022-05-12: qty 10

## 2022-05-12 MED ORDER — DIPHENHYDRAMINE HCL 50 MG/ML IJ SOLN
50.0000 mg | Freq: Once | INTRAMUSCULAR | Status: AC
  Administered 2022-05-12: 50 mg via INTRAVENOUS
  Filled 2022-05-12: qty 1

## 2022-05-12 MED ORDER — SODIUM CHLORIDE 0.9 % IV SOLN: Freq: Once | INTRAVENOUS | Status: AC

## 2022-05-12 MED ORDER — SODIUM CHLORIDE 0.9 % IV SOLN
50.0000 mg/m2 | Freq: Once | INTRAVENOUS | Status: AC
  Administered 2022-05-12: 108 mg via INTRAVENOUS
  Filled 2022-05-12: qty 18

## 2022-05-12 MED ORDER — FAMOTIDINE IN NACL 20-0.9 MG/50ML-% IV SOLN
20.0000 mg | Freq: Once | INTRAVENOUS | Status: AC
  Administered 2022-05-12: 20 mg via INTRAVENOUS
  Filled 2022-05-12: qty 50

## 2022-05-12 MED ORDER — PALONOSETRON HCL INJECTION 0.25 MG/5ML
0.2500 mg | Freq: Once | INTRAVENOUS | Status: AC
  Administered 2022-05-12: 0.25 mg via INTRAVENOUS
  Filled 2022-05-12: qty 5

## 2022-05-12 NOTE — Progress Notes (Signed)
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  PRIOR THERAPY: none  NGS Results: not done  CURRENT THERAPY: Chemoradiation therapy with weekly carboplatin and paclitaxel  BRIEF ONCOLOGIC HISTORY:  Oncology History  GE junction carcinoma ([place])  04/12/2022 Initial Diagnosis   GE junction carcinoma ([place])   05/12/2022 -  Chemotherapy   Patient is on Treatment Plan : ESOPHAGUS Carboplatin/PACLitaxel weekly x 6 weeks with XRT         [name] STAGING:  [name] Staging  GE junction carcinoma ([place]) Staging form: Esophagus - Adenocarcinoma, AJCC 8th Edition - Clinical stage from 04/12/2022: Stage III (cT3, cN0, cM0, G3) - Unsigned   INTERVAL HISTORY:  [name], a 61 y.o. male, returns for routine follow-up and consideration for next cycle of chemotherapy. [name] [name] was last seen on 05/02/2022.  Due for cycle #1 of Carboplatin and Taxol today.   Overall, he tells me he has been feeling pretty well.   Overall, he feels ready for next cycle of chemo today.    REVIEW OF SYSTEMS:  Review of Systems  Constitutional:  Negative for appetite change and fatigue.  All other systems reviewed and are negative.   PAST MEDICAL/SURGICAL HISTORY:  Past Medical History:  Diagnosis Date   Port-A-Cath in place 05/11/2022   Past Surgical History:  Procedure Laterality Date   BIOPSY  03/25/2022   Procedure: BIOPSY;  Surgeon: [name] Quale, MD;  [facility];  Service: Gastroenterology;;  GE junction;   BIOPSY  04/25/2022   Procedure: BIOPSY;  Surgeon: [name] [name]., MD;  Location: [facility];  Service: Gastroenterology;;   COLONOSCOPY     COLONOSCOPY WITH PROPOFOL N/A 03/25/2022   Procedure: COLONOSCOPY WITH PROPOFOL;   Surgeon: [name] Quale, MD;  [facility]: [facility];  Service: Gastroenterology;  Laterality: N/A;  945 ASA 1   ESOPHAGOGASTRODUODENOSCOPY (EGD) WITH PROPOFOL N/A 03/25/2022   Procedure: ESOPHAGOGASTRODUODENOSCOPY (EGD) WITH PROPOFOL;  Surgeon: [name] Quale, MD;  [facility];  Service: Gastroenterology;  Laterality: N/A;   ESOPHAGOGASTRODUODENOSCOPY (EGD) WITH PROPOFOL N/A 04/25/2022   Procedure: ESOPHAGOGASTRODUODENOSCOPY (EGD) WITH PROPOFOL;  Surgeon: [name] [name]., MD;  Location: [facility];  Service: Gastroenterology;  Laterality: N/A;   IR IMAGING GUIDED PORT INSERTION  05/04/2022   UPPER ESOPHAGEAL ENDOSCOPIC ULTRASOUND (EUS) N/A 04/25/2022   Procedure: UPPER ESOPHAGEAL ENDOSCOPIC ULTRASOUND (EUS);  Surgeon: [name] [name]., MD;  Location: [facility];  Service: Gastroenterology;  Laterality: N/A;    SOCIAL HISTORY:  Social History   Socioeconomic History   Marital status: Married    Spouse name: Not on file   Number of children: Not on file   Years of education: Not on file   Highest education level: Not on file  Occupational History   Not on file  Tobacco Use   Smoking status: Never    Passive exposure: Past   Smokeless tobacco: Never  Vaping Use   Vaping Use: Never used  Substance and Sexual Activity   Alcohol use: Yes    Comment: 1 daily   Drug use: Never   Sexual activity: Yes  Other Topics Concern   Not on file  Social History Narrative   Not on file   Social Determinants of Health   Financial  Resource Strain: Not on file  Food Insecurity: Not on file  Transportation Needs: Not on file  Physical Activity: Not on file  Stress: Not on file  Social Connections: Not on file  Intimate Partner Violence: Not on file    FAMILY HISTORY:  Family History  Problem Relation Age of Onset   [name] Maternal Grandmother        Breast [name]   [name] Other        Maternal Cousin - breast [name]    CURRENT  MEDICATIONS:  Current Outpatient Medications  Medication Sig Dispense Refill   CARBOPLATIN IV Inject into the vein once a week.     PACLitaxel (TAXOL IV) Inject into the vein once a week.     lidocaine-prilocaine (EMLA) cream Apply a small amount to port a cath site and cover with plastic wrap 1 hour prior to infusion appointments 30 g 3   prochlorperazine (COMPAZINE) 10 MG tablet Take 1 tablet (10 mg total) by mouth every 6 (six) hours as needed (Nausea or vomiting). 60 tablet 3   No current facility-administered medications for this visit.    ALLERGIES:  No Known Allergies  PHYSICAL EXAM:  Performance status (ECOG): 0 - Asymptomatic  There were no vitals filed for this visit. Wt Readings from Last 3 Encounters:  05/10/22 210 lb 4.8 oz (95.4 kg)  05/09/22 210 lb 2 oz (95.3 kg)  05/04/22 209 lb (94.8 kg)   Physical Exam Vitals reviewed.  Constitutional:      Appearance: Normal appearance.  Cardiovascular:     Rate and Rhythm: Normal rate and regular rhythm.     Pulses: Normal pulses.     Heart sounds: Normal heart sounds.  Pulmonary:     Effort: Pulmonary effort is normal.     Breath sounds: Normal breath sounds.  Chest:     Comments: R side Port-a-cath Neurological:     General: No focal deficit present.     Mental Status: He is alert and oriented to person, place, and time.  Psychiatric:        Mood and Affect: Mood normal.        Behavior: Behavior normal.     LABORATORY DATA:  I have reviewed the labs as listed.      No data to display             No data to display          DIAGNOSTIC IMAGING:  I have independently reviewed the scans and discussed with the patient. IR IMAGING GUIDED PORT INSERTION  Result Date: 05/04/2022 INDICATION: 61 year old male with history of esophageal [name] requiring central venous access for chemotherapy administration. EXAM: IMPLANTED PORT A CATH PLACEMENT WITH ULTRASOUND AND FLUOROSCOPIC GUIDANCE COMPARISON:  None  Available. MEDICATIONS: None. ANESTHESIA/SEDATION: Moderate (conscious) sedation was employed during this procedure. A total of Versed 1 mg and Fentanyl 50 mcg was administered intravenously. Moderate Sedation Time: 14 minutes. The patient's level of consciousness and vital signs were monitored continuously by radiology nursing throughout the procedure under my direct supervision. CONTRAST:  None FLUOROSCOPY TIME:  6 seconds, 1 mGy COMPLICATIONS: None immediate. PROCEDURE: The procedure, risks, benefits, and alternatives were explained to the patient. Questions regarding the procedure were encouraged and answered. The patient understands and consents to the procedure. The right neck and chest were prepped with chlorhexidine in a sterile fashion, and a sterile drape was applied covering the operative field. Maximum barrier sterile technique with sterile gowns and gloves were used for  the procedure. A timeout was performed prior to the initiation of the procedure. Ultrasound was used to examine the jugular vein which was compressible and free of internal echoes. A skin marker was used to demarcate the planned venotomy and port pocket incision sites. Local anesthesia was provided to these sites and the subcutaneous tunnel track with 1% lidocaine with 1:100,000 epinephrine. A small incision was created at the jugular access site and blunt dissection was performed of the subcutaneous tissues. Under ultrasound guidance, the jugular vein was accessed with a 21 ga micropuncture needle and an 0.018" wire was inserted to the superior vena cava. Real-time ultrasound guidance was utilized for vascular access including the acquisition of a permanent ultrasound image documenting patency of the accessed vessel. A 5 Fr micopuncture set was then used, through which a 0.035" Rosen wire was passed under fluoroscopic guidance into the inferior vena cava. An 8 Fr dilator was then placed over the wire. A subcutaneous port pocket was then  created along the upper chest wall utilizing a combination of sharp and blunt dissection. The pocket was irrigated with sterile saline, packed with gauze, and observed for hemorrhage. A single lumen "ISP" sized power injectable port was chosen for placement. The 8 Fr catheter was tunneled from the port pocket site to the venotomy incision. The port was placed in the pocket. The external catheter was trimmed to appropriate length. The dilator was exchanged for an 8 Fr peel-away sheath under fluoroscopic guidance. The catheter was then placed through the sheath and the sheath was removed. Final catheter positioning was confirmed and documented with a fluoroscopic spot radiograph. The port was accessed with a Huber needle, aspirated, and flushed with heparinized saline. The deep dermal layer of the port pocket incision was closed with interrupted 3-0 Vicryl suture. Dermabond was then placed over the port pocket and neck incisions. The patient tolerated the procedure well without immediate post procedural complication. FINDINGS: After catheter placement, the tip lies within the superior cavoatrial junction. The catheter aspirates and flushes normally and is ready for immediate use. IMPRESSION: Successful placement of a power injectable Port-A-Cath via the right internal jugular vein. The catheter is ready for immediate use. [name] Cancer, MD [facility] Electronically Signed   By: [name] [name] M.D.   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  He lost about 60 pounds since September, was trying to lose weight by walking 7 miles a day. - EGD (03/25/2022): Large ulcerating mass with no bleeding found at the GE junction, 40-42 cm from incisors.  Mass appeared to extend 1 cm distal towards the cardia.  Partially  obstructing and circumferential.  Normal stomach and duodenum. 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 - CT CAP (03/30/2022): Thickened folds of the GE junction without discrete focal mass.  No upstream dilatation.  No intrathoracic, axillary or abdominal adenopathy.  5 mm right upper lobe solitary nodule.  2.4 cm densely calcified right thyroid nodule.  Mild hepatic steatosis. - PET CT scan (04/07/2022): Hypermetabolic area at the GE junction SUV 6.93.  No signs of adenopathy or suspicious lung nodules.  No sign of other metastatic disease.  Left thyroid lesion with moderately increased metabolic activity, SUV 4.31 - EGD/EUS (04/25/2022): Malignant esophageal tumor at distal esophagus extending into GE junction and into the cardia.  EUS  staging T3N0.  No malignant appearing lymph nodes visualized.  Mass extends from 37 cm to 42 cm (esophageal portion is 37-40 cm).  Lesion was circumferential.  Sonographic evidence suggesting invasion into 1 through the muscularis propria.  Intact interface seen between the mass and adjacent structures. - Dr. Kipp [name] recommended surgery between 6 to 10 weeks after completion of chemoradiation with repeat PET scan in 4 to 6 weeks after last dose of radiation. - Concurrent chemoradiation with carboplatin and paclitaxel followed by surgery and possibly adjuvant nivolumab for 1 year (Checkmate 577) if no complete pathological response is achieved. - Concurrent chemoradiation therapy started on 05/12/2022.    Social/family history: - Lives at home with his wife.  He is a retired Electronics engineer at [employer].  Non-smoker.  Drinks alcohol 5 days/week, moderate. - Maternal grandmother had breast [name].  Maternal cousin had metastatic breast [name].   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 - We have reviewed his labs today which showed normal LFTs,  creatinine.  CBC was grossly normal. - We discussed chemotherapy plan with carboplatin and paclitaxel weekly throughout the course of radiation therapy. - We discussed the side effects in detail. - He will proceed with week 1 of treatment today.  RTC 1 week for follow-up.  2.  Incidental PET positive left thyroid nodule: - Biopsy of the right thyroid nodule on 06/23/2951, scant follicular epithelium, Bethesda category 1. - He will need work-up for left thyroid nodule after completion of treatments.   Orders placed this encounter:  No orders of the defined types were placed in this encounter.    [name] Jack, MD [facility] [phone]   I, Thana Ates, am acting as a scribe for Dr. Derek [name].  I, [name] Jack MD, have reviewed the above documentation for accuracy and completeness, and I agree with the above.

## 2022-05-12 NOTE — Patient Instructions (Signed)
[facility]  Discharge Instructions: Thank you for choosing [facility] to provide your oncology and hematology care.  If you have a lab appointment with the [facility], please come in thru the Main Entrance and check in at the main information desk.  Wear comfortable clothing and clothing appropriate for easy access to any Portacath or PICC line.   We strive to give you quality time with your provider. You may need to reschedule your appointment if you arrive late (15 or more minutes).  Arriving late affects you and other patients whose appointments are after yours.  Also, if you miss three or more appointments without notifying the office, you may be dismissed from the clinic at the provider's discretion.      For prescription refill requests, have your pharmacy contact our office and allow 72 hours for refills to be completed.    Today you received the following chemotherapy and/or immunotherapy agents  C1D1 Taxol and Carboplatin.   To help prevent nausea and vomiting after your treatment, we encourage you to take your nausea medication as directed.  BELOW ARE SYMPTOMS THAT SHOULD BE REPORTED IMMEDIATELY: *FEVER GREATER THAN 100.4 F (38 C) OR HIGHER *CHILLS OR SWEATING *NAUSEA AND VOMITING THAT IS NOT CONTROLLED WITH YOUR NAUSEA MEDICATION *UNUSUAL SHORTNESS OF BREATH *UNUSUAL BRUISING OR BLEEDING *URINARY PROBLEMS (pain or burning when urinating, or frequent urination) *BOWEL PROBLEMS (unusual diarrhea, constipation, pain near the anus) TENDERNESS IN MOUTH AND THROAT WITH OR WITHOUT PRESENCE OF ULCERS (sore throat, sores in mouth, or a toothache) UNUSUAL RASH, SWELLING OR PAIN  UNUSUAL VAGINAL DISCHARGE OR ITCHING   Items with * indicate a potential emergency and should be followed up as soon as possible or go to the Emergency Department if any problems should occur.  Please show the CHEMOTHERAPY ALERT CARD or IMMUNOTHERAPY ALERT CARD at check-in to the  Emergency Department and triage nurse.  Should you have questions after your visit or need to cancel or reschedule your appointment, please contact [facility] [phone]  and follow the prompts.  Office hours are 8:00 a.m. to 4:30 p.m. Monday - Friday. Please note that voicemails left after 4:00 p.m. may not be returned until the following business day.  We are closed weekends and major holidays. You have access to a nurse at all times for urgent questions. Please call the main number to the clinic [phone] and follow the prompts.  For any non-urgent questions, you may also contact your provider using MyChart. We now offer e-Visits for anyone 58 and older to request care online for non-urgent symptoms. For details visit [email or web address].   Also download the MyChart app! Go to the app store, search "MyChart", open the app, select [facility], and log in with your MyChart username and password.  Masks are optional in the cancer centers. If you would like for your care team to wear a mask while they are taking care of you, please let them know. For doctor visits, patients may have with them one support person who is at least 63 years old. At this time, visitors are not allowed in the infusion area.  Paclitaxel injection What is this medication? PACLITAXEL (PAK li TAX el) is a chemotherapy drug. It targets fast dividing cells, like cancer cells, and causes these cells to die. This medicine is used to treat ovarian cancer, breast cancer, lung cancer, Kaposi's sarcoma, and other cancers. This medicine may be used for other purposes; ask your health care  provider or pharmacist if you have questions. COMMON BRAND NAME(S): Onxol, Taxol What should I tell my care team before I take this medication? They need to know if you have any of these conditions: history of irregular heartbeat liver disease low blood counts, like low white cell, platelet, or red cell counts lung or breathing  disease, like asthma tingling of the fingers or toes, or other nerve disorder an unusual or allergic reaction to paclitaxel, alcohol, polyoxyethylated castor oil, other chemotherapy, other medicines, foods, dyes, or preservatives pregnant or trying to get pregnant breast-feeding How should I use this medication? This drug is given as an infusion into a vein. It is administered in a hospital or clinic by a specially trained health care professional. Talk to your pediatrician regarding the use of this medicine in children. Special care may be needed. Overdosage: If you think you have taken too much of this medicine contact a poison control center or emergency room at once. NOTE: This medicine is only for you. Do not share this medicine with others. What if I miss a dose? It is important not to miss your dose. Call your doctor or health care professional if you are unable to keep an appointment. What may interact with this medication? Do not take this medicine with any of the following medications: live virus vaccines This medicine may also interact with the following medications: antiviral medicines for hepatitis, HIV or AIDS certain antibiotics like erythromycin and clarithromycin certain medicines for fungal infections like ketoconazole and itraconazole certain medicines for seizures like carbamazepine, phenobarbital, phenytoin gemfibrozil nefazodone rifampin St. John's wort This list may not describe all possible interactions. Give your health care provider a list of all the medicines, herbs, non-prescription drugs, or dietary supplements you use. Also tell them if you smoke, drink alcohol, or use illegal drugs. Some items may interact with your medicine. What should I watch for while using this medication? Your condition will be monitored carefully while you are receiving this medicine. You will need important blood work done while you are taking this medicine. This medicine can cause  serious allergic reactions. To reduce your risk you will need to take other medicine(s) before treatment with this medicine. If you experience allergic reactions like skin rash, itching or hives, swelling of the face, lips, or tongue, tell your doctor or health care professional right away. In some cases, you may be given additional medicines to help with side effects. Follow all directions for their use. This drug may make you feel generally unwell. This is not uncommon, as chemotherapy can affect healthy cells as well as cancer cells. Report any side effects. Continue your course of treatment even though you feel ill unless your doctor tells you to stop. Call your doctor or health care professional for advice if you get a fever, chills or sore throat, or other symptoms of a cold or flu. Do not treat yourself. This drug decreases your body's ability to fight infections. Try to avoid being around people who are sick. This medicine may increase your risk to bruise or bleed. Call your doctor or health care professional if you notice any unusual bleeding. Be careful brushing and flossing your teeth or using a toothpick because you may get an infection or bleed more easily. If you have any dental work done, tell your dentist you are receiving this medicine. Avoid taking products that contain aspirin, acetaminophen, ibuprofen, naproxen, or ketoprofen unless instructed by your doctor. These medicines may hide a fever. Do not  become pregnant while taking this medicine. Women should inform their doctor if they wish to become pregnant or think they might be pregnant. There is a potential for serious side effects to an unborn child. Talk to your health care professional or pharmacist for more information. Do not breast-feed an infant while taking this medicine. Men are advised not to father a child while receiving this medicine. This product may contain alcohol. Ask your pharmacist or healthcare provider if this  medicine contains alcohol. Be sure to tell all healthcare providers you are taking this medicine. Certain medicines, like metronidazole and disulfiram, can cause an unpleasant reaction when taken with alcohol. The reaction includes flushing, headache, nausea, vomiting, sweating, and increased thirst. The reaction can last from 30 minutes to several hours. What side effects may I notice from receiving this medication? Side effects that you should report to your doctor or health care professional as soon as possible: allergic reactions like skin rash, itching or hives, swelling of the face, lips, or tongue breathing problems changes in vision fast, irregular heartbeat high or low blood pressure mouth sores pain, tingling, numbness in the hands or feet signs of decreased platelets or bleeding - bruising, pinpoint red spots on the skin, black, tarry stools, blood in the urine signs of decreased red blood cells - unusually weak or tired, feeling faint or lightheaded, falls signs of infection - fever or chills, cough, sore throat, pain or difficulty passing urine signs and symptoms of liver injury like dark yellow or brown urine; general ill feeling or flu-like symptoms; light-colored stools; loss of appetite; nausea; right upper belly pain; unusually weak or tired; yellowing of the eyes or skin swelling of the ankles, feet, hands unusually slow heartbeat Side effects that usually do not require medical attention (report to your doctor or health care professional if they continue or are bothersome): diarrhea hair loss loss of appetite muscle or joint pain nausea, vomiting pain, redness, or irritation at site where injected tiredness This list may not describe all possible side effects. Call your doctor for medical advice about side effects. You may report side effects to FDA at 1-800-FDA-1088. Where should I keep my medication? This drug is given in a hospital or clinic and will not be stored at  home. NOTE: This sheet is a summary. It may not cover all possible information. If you have questions about this medicine, talk to your doctor, pharmacist, or health care provider.  2023 Elsevier/Gold Standard (2021-09-03 00:00:00)  Carboplatin injection What is this medication? CARBOPLATIN (KAR boe pla tin) is a chemotherapy drug. It targets fast dividing cells, like cancer cells, and causes these cells to die. This medicine is used to treat ovarian cancer and many other cancers. This medicine may be used for other purposes; ask your health care provider or pharmacist if you have questions. COMMON BRAND NAME(S): Paraplatin What should I tell my care team before I take this medication? They need to know if you have any of these conditions: blood disorders hearing problems kidney disease recent or ongoing radiation therapy an unusual or allergic reaction to carboplatin, cisplatin, other chemotherapy, other medicines, foods, dyes, or preservatives pregnant or trying to get pregnant breast-feeding How should I use this medication? This drug is usually given as an infusion into a vein. It is administered in a hospital or clinic by a specially trained health care professional. Talk to your pediatrician regarding the use of this medicine in children. Special care may be needed. Overdosage: If you think  you have taken too much of this medicine contact a poison control center or emergency room at once. NOTE: This medicine is only for you. Do not share this medicine with others. What if I miss a dose? It is important not to miss a dose. Call your doctor or health care professional if you are unable to keep an appointment. What may interact with this medication? medicines for seizures medicines to increase blood counts like filgrastim, pegfilgrastim, sargramostim some antibiotics like amikacin, gentamicin, neomycin, streptomycin, tobramycin vaccines Talk to your doctor or health care  professional before taking any of these medicines: acetaminophen aspirin ibuprofen ketoprofen naproxen This list may not describe all possible interactions. Give your health care provider a list of all the medicines, herbs, non-prescription drugs, or dietary supplements you use. Also tell them if you smoke, drink alcohol, or use illegal drugs. Some items may interact with your medicine. What should I watch for while using this medication? Your condition will be monitored carefully while you are receiving this medicine. You will need important blood work done while you are taking this medicine. This drug may make you feel generally unwell. This is not uncommon, as chemotherapy can affect healthy cells as well as cancer cells. Report any side effects. Continue your course of treatment even though you feel ill unless your doctor tells you to stop. In some cases, you may be given additional medicines to help with side effects. Follow all directions for their use. Call your doctor or health care professional for advice if you get a fever, chills or sore throat, or other symptoms of a cold or flu. Do not treat yourself. This drug decreases your body's ability to fight infections. Try to avoid being around people who are sick. This medicine may increase your risk to bruise or bleed. Call your doctor or health care professional if you notice any unusual bleeding. Be careful brushing and flossing your teeth or using a toothpick because you may get an infection or bleed more easily. If you have any dental work done, tell your dentist you are receiving this medicine. Avoid taking products that contain aspirin, acetaminophen, ibuprofen, naproxen, or ketoprofen unless instructed by your doctor. These medicines may hide a fever. Do not become pregnant while taking this medicine. Women should inform their doctor if they wish to become pregnant or think they might be pregnant. There is a potential for serious side  effects to an unborn child. Talk to your health care professional or pharmacist for more information. Do not breast-feed an infant while taking this medicine. What side effects may I notice from receiving this medication? Side effects that you should report to your doctor or health care professional as soon as possible: allergic reactions like skin rash, itching or hives, swelling of the face, lips, or tongue signs of infection - fever or chills, cough, sore throat, pain or difficulty passing urine signs of decreased platelets or bleeding - bruising, pinpoint red spots on the skin, black, tarry stools, nosebleeds signs of decreased red blood cells - unusually weak or tired, fainting spells, lightheadedness breathing problems changes in hearing changes in vision chest pain high blood pressure low blood counts - This drug may decrease the number of white blood cells, red blood cells and platelets. You may be at increased risk for infections and bleeding. nausea and vomiting pain, swelling, redness or irritation at the injection site pain, tingling, numbness in the hands or feet problems with balance, talking, walking trouble passing urine or change  in the amount of urine Side effects that usually do not require medical attention (report to your doctor or health care professional if they continue or are bothersome): hair loss loss of appetite metallic taste in the mouth or changes in taste This list may not describe all possible side effects. Call your doctor for medical advice about side effects. You may report side effects to FDA at 1-800-FDA-1088. Where should I keep my medication? This drug is given in a hospital or clinic and will not be stored at home. NOTE: This sheet is a summary. It may not cover all possible information. If you have questions about this medicine, talk to your doctor, pharmacist, or health care provider.  2023 Elsevier/Gold Standard (2008-03-12 00:00:00)

## 2022-05-12 NOTE — Progress Notes (Signed)
Pt presents today for C1D1 Taxol and Carboplatin per provider's order. Vital signs and labs WNL for treatment. Okay to proceed with treatment today per Dr.K.  C1D1 Taxol and Carboplatin given today per MD orders. Tolerated infusion without adverse affects. Vital signs stable. No complaints at this time. Discharged from clinic ambulatory in stable condition. Alert and oriented x 3. F/U with [facility] as scheduled.

## 2022-05-12 NOTE — Progress Notes (Signed)
[facility]  Initial Assessment   [name] [name] is a 61 y.o. year old male presenting alone. Clinical Social Work was referred by medical provider for assessment of psychosocial needs.   SDOH (Social Determinants of Health) assessments performed: Yes   SDOH Screenings   Alcohol Screen: Not on file  Depression (JGG8-3): Not on file  Financial Resource Strain: Not on file  Food Insecurity: Not on file  Housing: Not on file  Physical Activity: Not on file  Social Connections: Not on file  Stress: Not on file  Tobacco Use: Low Risk  (05/12/2022)   Patient History    Smoking Tobacco Use: Never    Smokeless Tobacco Use: Never    Passive Exposure: Past  Transportation Needs: Not on file     Distress Screen completed: No     No data to display            Family/Social Information:  Housing Arrangement: patient lives with his wife [name] [name]) Family members/support persons in your life? The couple has 63 year old twins who both reside out of state.  Pt is well connected to the community and reports good support from both his wife and friends. Transportation concerns: no  Employment: Retired [employer] is retired from his position as an Electronics engineer at [employer], but has recently invested in opening a wine bar which he intends to follow through with post treatment.  Pt's wife continues to work as an Programme researcher, broadcasting/film/video.  Income source: Employment Financial concerns: No Type of concern: None Food access concerns: no Religious or spiritual practice: Not known Services Currently in place:  none  Coping/ Adjustment to diagnosis: Patient understands treatment plan and what happens next? Pt has independently researched his condition as well as his providers and is well versed in what to expect throughout duration of treatment. Concerns about diagnosis and/or treatment:  Pt states he is not concerned about chemo and radiation; however, the surgery is anticipated to be  extensive. Patient reported stressors:  No stressors reported.  Pt reports his wife has been treated for breast cancer twice and he has friends and family who have also undergone cancer treatment which has provided him with insight regarding what to expect. Hopes and/or priorities: Pt's priority is to start and complete treatment which will allow him to focus on opening the wine bar he has been looking forward to opening for some time. Patient enjoys time with family/ friends and educating people about wine. Current coping skills/ strengths: Ability for insight , Average or above average intelligence , General fund of knowledge , Motivation for treatment/growth , and Physical Health     SUMMARY: Current SDOH Barriers:  No barriers identified  Clinical Social Work Clinical Goal(s):  No clinical social work goals at this time  Interventions: Discussed common feeling and emotions when being diagnosed with cancer, and the importance of support during treatment Informed patient of the support team roles and support services at [facility] Provided [facility] contact information and encouraged patient to call with any questions or concerns    Follow Up Plan: Patient will contact CSW with any support or resource needs Patient verbalizes understanding of plan: Yes    Henriette Combs, LCSW

## 2022-05-13 ENCOUNTER — Other Ambulatory Visit: Payer: Self-pay

## 2022-05-13 ENCOUNTER — Telehealth (HOSPITAL_COMMUNITY): Payer: Self-pay | Admitting: *Deleted

## 2022-05-13 ENCOUNTER — Ambulatory Visit
Admission: RE | Admit: 2022-05-13 | Discharge: 2022-05-13 | Disposition: A | Discharge status: Home / Self Care | Payer: No Typology Code available for payment source | Source: Ambulatory Visit | Attending: Radiation Oncology | Admitting: Radiation Oncology

## 2022-05-13 DIAGNOSIS — Z510 Encounter for antineoplastic radiation therapy: Secondary | ICD-10-CM | POA: Diagnosis not present

## 2022-05-13 LAB — RAD ONC ARIA SESSION SUMMARY
Course Elapsed Days: 1
Plan Fractions Treated to Date: 2
Plan Prescribed Dose Per Fraction: 1.8 Gy
Plan Total Fractions Prescribed: 25
Plan Total Prescribed Dose: 45 Gy
Reference Point Dosage Given to Date: 3.6 Gy
Reference Point Session Dosage Given: 1.8 Gy
Session Number: 2

## 2022-05-13 NOTE — Telephone Encounter (Signed)
24 hour chemotherapy follow-up called placed today. Pt stated he felt good and denies nausea, vomiting, headaches, and shortness of breath. Pt advised to call the clinic if needed and pt verbalized understanding,

## 2022-05-16 ENCOUNTER — Ambulatory Visit: Payer: No Typology Code available for payment source

## 2022-05-17 ENCOUNTER — Other Ambulatory Visit: Payer: Self-pay

## 2022-05-17 ENCOUNTER — Ambulatory Visit: Payer: No Typology Code available for payment source

## 2022-05-17 ENCOUNTER — Ambulatory Visit
Admission: RE | Admit: 2022-05-17 | Discharge: 2022-05-17 | Disposition: A | Discharge status: Home / Self Care | Payer: No Typology Code available for payment source | Source: Ambulatory Visit | Attending: Radiation Oncology | Admitting: Radiation Oncology

## 2022-05-17 DIAGNOSIS — Z803 Family history of malignant neoplasm of breast: Secondary | ICD-10-CM | POA: Diagnosis not present

## 2022-05-17 DIAGNOSIS — R42 Dizziness and giddiness: Secondary | ICD-10-CM | POA: Diagnosis not present

## 2022-05-17 DIAGNOSIS — Z510 Encounter for antineoplastic radiation therapy: Secondary | ICD-10-CM | POA: Diagnosis not present

## 2022-05-17 DIAGNOSIS — E041 Nontoxic single thyroid nodule: Secondary | ICD-10-CM | POA: Diagnosis not present

## 2022-05-17 DIAGNOSIS — R911 Solitary pulmonary nodule: Secondary | ICD-10-CM | POA: Diagnosis not present

## 2022-05-17 DIAGNOSIS — C160 Malignant neoplasm of cardia: Secondary | ICD-10-CM | POA: Diagnosis not present

## 2022-05-17 DIAGNOSIS — R1319 Other dysphagia: Secondary | ICD-10-CM

## 2022-05-17 DIAGNOSIS — K760 Fatty (change of) liver, not elsewhere classified: Secondary | ICD-10-CM | POA: Diagnosis not present

## 2022-05-17 DIAGNOSIS — Z79899 Other long term (current) drug therapy: Secondary | ICD-10-CM | POA: Diagnosis not present

## 2022-05-17 DIAGNOSIS — R112 Nausea with vomiting, unspecified: Secondary | ICD-10-CM | POA: Diagnosis not present

## 2022-05-17 DIAGNOSIS — Z5111 Encounter for antineoplastic chemotherapy: Secondary | ICD-10-CM | POA: Diagnosis present

## 2022-05-17 LAB — RAD ONC ARIA SESSION SUMMARY
Course Elapsed Days: 5
Plan Fractions Treated to Date: 3
Plan Prescribed Dose Per Fraction: 1.8 Gy
Plan Total Fractions Prescribed: 25
Plan Total Prescribed Dose: 45 Gy
Reference Point Dosage Given to Date: 5.4 Gy
Reference Point Session Dosage Given: 1.8 Gy
Session Number: 3

## 2022-05-17 MED ORDER — SONAFINE EX EMUL
1.0000 | Freq: Once | CUTANEOUS | Status: AC
  Administered 2022-05-17: 1 via TOPICAL

## 2022-05-17 NOTE — Progress Notes (Signed)
Pt here for patient teaching.    Pt given Radiation and You booklet, skin care instructions, and Sonafine.    Reviewed areas of pertinence such as diarrhea, fatigue, hair loss in treatment field, nausea and vomiting, skin changes, urinary and bladder changes, and shortness of breath .   Pt able to give teach back of use unscented/gentle soap,apply Sonafine bid and avoid applying anything to skin within 4 hours of treatment.   Pt verbalizes understanding of information given and will contact nursing with any questions or concerns.

## 2022-05-18 ENCOUNTER — Other Ambulatory Visit: Payer: Self-pay

## 2022-05-18 ENCOUNTER — Ambulatory Visit
Admission: RE | Admit: 2022-05-18 | Discharge: 2022-05-18 | Disposition: A | Discharge status: Home / Self Care | Payer: No Typology Code available for payment source | Source: Ambulatory Visit | Attending: Radiation Oncology | Admitting: Radiation Oncology

## 2022-05-18 DIAGNOSIS — Z5111 Encounter for antineoplastic chemotherapy: Secondary | ICD-10-CM | POA: Diagnosis not present

## 2022-05-18 LAB — RAD ONC ARIA SESSION SUMMARY
Course Elapsed Days: 6
Plan Fractions Treated to Date: 4
Plan Prescribed Dose Per Fraction: 1.8 Gy
Plan Total Fractions Prescribed: 25
Plan Total Prescribed Dose: 45 Gy
Reference Point Dosage Given to Date: 7.2 Gy
Reference Point Session Dosage Given: 1.8 Gy
Session Number: 4

## 2022-05-19 ENCOUNTER — Inpatient Hospital Stay: Payer: No Typology Code available for payment source

## 2022-05-19 ENCOUNTER — Ambulatory Visit
Admission: RE | Admit: 2022-05-19 | Discharge: 2022-05-19 | Disposition: A | Discharge status: Home / Self Care | Payer: No Typology Code available for payment source | Source: Ambulatory Visit | Attending: Radiation Oncology | Admitting: Radiation Oncology

## 2022-05-19 ENCOUNTER — Other Ambulatory Visit: Payer: Self-pay

## 2022-05-19 ENCOUNTER — Inpatient Hospital Stay (HOSPITAL_COMMUNITY): Payer: No Typology Code available for payment source

## 2022-05-19 ENCOUNTER — Ambulatory Visit: Payer: No Typology Code available for payment source | Admitting: Dietician

## 2022-05-19 ENCOUNTER — Inpatient Hospital Stay (HOSPITAL_BASED_OUTPATIENT_CLINIC_OR_DEPARTMENT_OTHER): Payer: No Typology Code available for payment source | Admitting: Hematology

## 2022-05-19 VITALS — BP 103/68 | HR 62 | Temp 98.3°F | Resp 18

## 2022-05-19 VITALS — BP 107/76 | HR 78 | Temp 98.0°F | Resp 18 | Ht 70.0 in | Wt 201.3 lb

## 2022-05-19 DIAGNOSIS — R911 Solitary pulmonary nodule: Secondary | ICD-10-CM | POA: Insufficient documentation

## 2022-05-19 DIAGNOSIS — Z510 Encounter for antineoplastic radiation therapy: Secondary | ICD-10-CM | POA: Insufficient documentation

## 2022-05-19 DIAGNOSIS — C160 Malignant neoplasm of cardia: Secondary | ICD-10-CM

## 2022-05-19 DIAGNOSIS — R112 Nausea with vomiting, unspecified: Secondary | ICD-10-CM | POA: Insufficient documentation

## 2022-05-19 DIAGNOSIS — Z803 Family history of malignant neoplasm of breast: Secondary | ICD-10-CM | POA: Insufficient documentation

## 2022-05-19 DIAGNOSIS — E041 Nontoxic single thyroid nodule: Secondary | ICD-10-CM | POA: Insufficient documentation

## 2022-05-19 DIAGNOSIS — Z5111 Encounter for antineoplastic chemotherapy: Secondary | ICD-10-CM | POA: Insufficient documentation

## 2022-05-19 DIAGNOSIS — R42 Dizziness and giddiness: Secondary | ICD-10-CM | POA: Insufficient documentation

## 2022-05-19 DIAGNOSIS — Z79899 Other long term (current) drug therapy: Secondary | ICD-10-CM | POA: Insufficient documentation

## 2022-05-19 DIAGNOSIS — K760 Fatty (change of) liver, not elsewhere classified: Secondary | ICD-10-CM | POA: Insufficient documentation

## 2022-05-19 DIAGNOSIS — Z95828 Presence of other vascular implants and grafts: Secondary | ICD-10-CM

## 2022-05-19 LAB — CBC WITH DIFFERENTIAL/PLATELET
Abs Immature Granulocytes: 0.02 10*3/uL (ref 0.00–0.07)
Basophils Absolute: 0 10*3/uL (ref 0.0–0.1)
Basophils Relative: 1 %
Eosinophils Absolute: 0.1 10*3/uL (ref 0.0–0.5)
Eosinophils Relative: 2 %
HCT: 38.1 % — ABNORMAL LOW (ref 39.0–52.0)
Hemoglobin: 13.2 g/dL (ref 13.0–17.0)
Immature Granulocytes: 1 %
Lymphocytes Relative: 14 %
Lymphs Abs: 0.6 10*3/uL — ABNORMAL LOW (ref 0.7–4.0)
MCH: 30.5 pg (ref 26.0–34.0)
MCHC: 34.6 g/dL (ref 30.0–36.0)
MCV: 88 fL (ref 80.0–100.0)
Monocytes Absolute: 0.2 10*3/uL (ref 0.1–1.0)
Monocytes Relative: 5 %
Neutro Abs: 3.1 10*3/uL (ref 1.7–7.7)
Neutrophils Relative %: 77 %
Platelets: 170 10*3/uL (ref 150–400)
RBC: 4.33 MIL/uL (ref 4.22–5.81)
RDW: 13.2 % (ref 11.5–15.5)
WBC: 4.1 10*3/uL (ref 4.0–10.5)
nRBC: 0 % (ref 0.0–0.2)

## 2022-05-19 LAB — RAD ONC ARIA SESSION SUMMARY
Course Elapsed Days: 7
Plan Fractions Treated to Date: 5
Plan Prescribed Dose Per Fraction: 1.8 Gy
Plan Total Fractions Prescribed: 25
Plan Total Prescribed Dose: 45 Gy
Reference Point Dosage Given to Date: 9 Gy
Reference Point Session Dosage Given: 1.8 Gy
Session Number: 5

## 2022-05-19 LAB — COMPREHENSIVE METABOLIC PANEL
ALT: 15 U/L (ref 0–44)
AST: 18 U/L (ref 15–41)
Albumin: 3.8 g/dL (ref 3.5–5.0)
Alkaline Phosphatase: 55 U/L (ref 38–126)
Anion gap: 4 — ABNORMAL LOW (ref 5–15)
BUN: 17 mg/dL (ref 8–23)
CO2: 28 mmol/L (ref 22–32)
Calcium: 9.8 mg/dL (ref 8.9–10.3)
Chloride: 104 mmol/L (ref 98–111)
Creatinine, Ser: 0.81 mg/dL (ref 0.61–1.24)
GFR, Estimated: >60 mL/min (ref >60)
Glucose, Bld: 117 mg/dL — ABNORMAL HIGH (ref 70–99)
Potassium: 4.3 mmol/L (ref 3.5–5.1)
Sodium: 136 mmol/L (ref 135–145)
Total Bilirubin: 1.3 mg/dL — ABNORMAL HIGH (ref 0.3–1.2)
Total Protein: 7.2 g/dL (ref 6.5–8.1)

## 2022-05-19 LAB — MAGNESIUM: Magnesium: 1.9 mg/dL (ref 1.7–2.4)

## 2022-05-19 MED ORDER — DIPHENHYDRAMINE HCL 50 MG/ML IJ SOLN
50.0000 mg | Freq: Once | INTRAMUSCULAR | Status: AC
  Administered 2022-05-19: 50 mg via INTRAVENOUS
  Filled 2022-05-19: qty 1

## 2022-05-19 MED ORDER — SODIUM CHLORIDE 0.9% FLUSH
10.0000 mL | INTRAVENOUS | Status: DC | PRN
  Administered 2022-05-19: 10 mL

## 2022-05-19 MED ORDER — SODIUM CHLORIDE 0.9 % IV SOLN
300.0000 mg | Freq: Once | INTRAVENOUS | Status: AC
  Administered 2022-05-19: 300 mg via INTRAVENOUS
  Filled 2022-05-19: qty 30

## 2022-05-19 MED ORDER — HEPARIN SOD (PORK) LOCK FLUSH 100 UNIT/ML IV SOLN
500.0000 [IU] | Freq: Once | INTRAVENOUS | Status: AC | PRN
  Administered 2022-05-19: 500 [IU]

## 2022-05-19 MED ORDER — SODIUM CHLORIDE 0.9 % IV SOLN
50.0000 mg/m2 | Freq: Once | INTRAVENOUS | Status: AC
  Administered 2022-05-19: 108 mg via INTRAVENOUS
  Filled 2022-05-19: qty 18

## 2022-05-19 MED ORDER — SODIUM CHLORIDE 0.9 % IV SOLN
10.0000 mg | Freq: Once | INTRAVENOUS | Status: AC
  Administered 2022-05-19: 10 mg via INTRAVENOUS
  Filled 2022-05-19: qty 10

## 2022-05-19 MED ORDER — PALONOSETRON HCL INJECTION 0.25 MG/5ML
0.2500 mg | Freq: Once | INTRAVENOUS | Status: AC
  Administered 2022-05-19: 0.25 mg via INTRAVENOUS
  Filled 2022-05-19: qty 5

## 2022-05-19 MED ORDER — FAMOTIDINE IN NACL 20-0.9 MG/50ML-% IV SOLN
20.0000 mg | Freq: Once | INTRAVENOUS | Status: AC
  Administered 2022-05-19: 20 mg via INTRAVENOUS
  Filled 2022-05-19: qty 50

## 2022-05-19 MED ORDER — SODIUM CHLORIDE 0.9 % IV SOLN: Freq: Once | INTRAVENOUS | Status: AC

## 2022-05-19 NOTE — Patient Instructions (Signed)
[facility]  Discharge Instructions: Thank you for choosing [facility] to provide your oncology and hematology care.  If you have a lab appointment with the [facility], please come in thru the Main Entrance and check in at the main information desk.  Wear comfortable clothing and clothing appropriate for easy access to any Portacath or PICC line.   We strive to give you quality time with your provider. You may need to reschedule your appointment if you arrive late (15 or more minutes).  Arriving late affects you and other patients whose appointments are after yours.  Also, if you miss three or more appointments without notifying the office, you may be dismissed from the clinic at the provider's discretion.      For prescription refill requests, have your pharmacy contact our office and allow 72 hours for refills to be completed.    Today you received the following chemotherapy and/or immunotherapy agents Taxol/Carboplatin.  Carboplatin Injection What is this medication? CARBOPLATIN (KAR boe pla tin) treats some types of cancer. It works by slowing down the growth of cancer cells. This medicine may be used for other purposes; ask your health care provider or pharmacist if you have questions. COMMON BRAND NAME(S): Paraplatin What should I tell my care team before I take this medication? They need to know if you have any of these conditions: Blood disorders Hearing problems Kidney disease Recent or ongoing radiation therapy An unusual or allergic reaction to carboplatin, cisplatin, other medications, foods, dyes, or preservatives Pregnant or trying to get pregnant Breast-feeding How should I use this medication? This medication is injected into a vein. It is given by your care team in a hospital or clinic setting. Talk to your care team about the use of this medication in children. Special care may be needed. Overdosage: If you think you have taken  too much of this medicine contact a poison control center or emergency room at once. NOTE: This medicine is only for you. Do not share this medicine with others. What if I miss a dose? Keep appointments for follow-up doses. It is important not to miss your dose. Call your care team if you are unable to keep an appointment. What may interact with this medication? Medications for seizures Some antibiotics, such as amikacin, gentamicin, neomycin, streptomycin, tobramycin Vaccines This list may not describe all possible interactions. Give your health care provider a list of all the medicines, herbs, non-prescription drugs, or dietary supplements you use. Also tell them if you smoke, drink alcohol, or use illegal drugs. Some items may interact with your medicine. What should I watch for while using this medication? Your condition will be monitored carefully while you are receiving this medication. You may need blood work while taking this medication. This medication may make you feel generally unwell. This is not uncommon, as chemotherapy can affect healthy cells as well as cancer cells. Report any side effects. Continue your course of treatment even though you feel ill unless your care team tells you to stop. In some cases, you may be given additional medications to help with side effects. Follow all directions for their use. This medication may increase your risk of getting an infection. Call your care team for advice if you get a fever, chills, sore throat, or other symptoms of a cold or flu. Do not treat yourself. Try to avoid being around people who are sick. Avoid taking medications that contain aspirin, acetaminophen, ibuprofen, naproxen, or ketoprofen unless  instructed by your care team. These medications may hide a fever. Be careful brushing or flossing your teeth or using a toothpick because you may get an infection or bleed more easily. If you have any dental work done, tell your dentist you are  receiving this medication. Talk to your care team if you wish to become pregnant or think you might be pregnant. This medication can cause serious birth defects. Talk to your care team about effective forms of contraception. Do not breast-feed while taking this medication. What side effects may I notice from receiving this medication? Side effects that you should report to your care team as soon as possible: Allergic reactions--skin rash, itching, hives, swelling of the face, lips, tongue, or throat Infection--fever, chills, cough, sore throat, wounds that don't heal, pain or trouble when passing urine, general feeling of discomfort or being unwell Low red blood cell level--unusual weakness or fatigue, dizziness, headache, trouble breathing Pain, tingling, or numbness in the hands or feet, muscle weakness, change in vision, confusion or trouble speaking, loss of balance or coordination, trouble walking, seizures Unusual bruising or bleeding Side effects that usually do not require medical attention (report to your care team if they continue or are bothersome): Hair loss Nausea Unusual weakness or fatigue Vomiting This list may not describe all possible side effects. Call your doctor for medical advice about side effects. You may report side effects to FDA at 1-800-FDA-1088. Where should I keep my medication? This medication is given in a hospital or clinic. It will not be stored at home. NOTE: This sheet is a summary. It may not cover all possible information. If you have questions about this medicine, talk to your doctor, pharmacist, or health care provider.  2023 Elsevier/Gold Standard (2022-01-25 00:00:00)    Paclitaxel Injection What is this medication? PACLITAXEL (PAK li TAX el) treats some types of cancer. It works by slowing down the growth of cancer cells. This medicine may be used for other purposes; ask your health care provider or pharmacist if you have questions. COMMON BRAND  NAME(S): Onxol, Taxol What should I tell my care team before I take this medication? They need to know if you have any of these conditions: Heart disease Liver disease Low white blood cell levels An unusual or allergic reaction to paclitaxel, other medications, foods, dyes, or preservatives If you or your partner are pregnant or trying to get pregnant Breast-feeding How should I use this medication? This medication is injected into a vein. It is given by your care team in a hospital or clinic setting. Talk to your care team about the use of this medication in children. While it may be given to children for selected conditions, precautions do apply. Overdosage: If you think you have taken too much of this medicine contact a poison control center or emergency room at once. NOTE: This medicine is only for you. Do not share this medicine with others. What if I miss a dose? Keep appointments for follow-up doses. It is important not to miss your dose. Call your care team if you are unable to keep an appointment. What may interact with this medication? Do not take this medication with any of the following: Live virus vaccines Other medications may affect the way this medication works. Talk with your care team about all of the medications you take. They may suggest changes to your treatment plan to lower the risk of side effects and to make sure your medications work as intended. This list may  not describe all possible interactions. Give your health care provider a list of all the medicines, herbs, non-prescription drugs, or dietary supplements you use. Also tell them if you smoke, drink alcohol, or use illegal drugs. Some items may interact with your medicine. What should I watch for while using this medication? Your condition will be monitored carefully while you are receiving this medication. You may need blood work while taking this medication. This medication may make you feel generally unwell.  This is not uncommon as chemotherapy can affect healthy cells as well as cancer cells. Report any side effects. Continue your course of treatment even though you feel ill unless your care team tells you to stop. This medication can cause serious allergic reactions. To reduce the risk, your care team may give you other medications to take before receiving this one. Be sure to follow the directions from your care team. This medication may increase your risk of getting an infection. Call your care team for advice if you get a fever, chills, sore throat, or other symptoms of a cold or flu. Do not treat yourself. Try to avoid being around people who are sick. This medication may increase your risk to bruise or bleed. Call your care team if you notice any unusual bleeding. Be careful brushing or flossing your teeth or using a toothpick because you may get an infection or bleed more easily. If you have any dental work done, tell your dentist you are receiving this medication. Talk to your care team if you may be pregnant. Serious birth defects can occur if you take this medication during pregnancy. Talk to your care team before breastfeeding. Changes to your treatment plan may be needed. What side effects may I notice from receiving this medication? Side effects that you should report to your care team as soon as possible: Allergic reactions--skin rash, itching, hives, swelling of the face, lips, tongue, or throat Heart rhythm changes--fast or irregular heartbeat, dizziness, feeling faint or lightheaded, chest pain, trouble breathing Increase in blood pressure Infection--fever, chills, cough, sore throat, wounds that don't heal, pain or trouble when passing urine, general feeling of discomfort or being unwell Low blood pressure--dizziness, feeling faint or lightheaded, blurry vision Low red blood cell level--unusual weakness or fatigue, dizziness, headache, trouble breathing Painful swelling, warmth, or  redness of the skin, blisters or sores at the infusion site Pain, tingling, or numbness in the hands or feet Slow heartbeat--dizziness, feeling faint or lightheaded, confusion, trouble breathing, unusual weakness or fatigue Unusual bruising or bleeding Side effects that usually do not require medical attention (report to your care team if they continue or are bothersome): Diarrhea Hair loss Joint pain Loss of appetite Muscle pain Nausea Vomiting This list may not describe all possible side effects. Call your doctor for medical advice about side effects. You may report side effects to FDA at 1-800-FDA-1088. Where should I keep my medication? This medication is given in a hospital or clinic. It will not be stored at home. NOTE: This sheet is a summary. It may not cover all possible information. If you have questions about this medicine, talk to your doctor, pharmacist, or health care provider.  2023 Elsevier/Gold Standard (2022-02-17 00:00:00)        To help prevent nausea and vomiting after your treatment, we encourage you to take your nausea medication as directed.  BELOW ARE SYMPTOMS THAT SHOULD BE REPORTED IMMEDIATELY: *FEVER GREATER THAN 100.4 F (38 C) OR HIGHER *CHILLS OR SWEATING *NAUSEA AND VOMITING THAT  IS NOT CONTROLLED WITH YOUR NAUSEA MEDICATION *UNUSUAL SHORTNESS OF BREATH *UNUSUAL BRUISING OR BLEEDING *URINARY PROBLEMS (pain or burning when urinating, or frequent urination) *BOWEL PROBLEMS (unusual diarrhea, constipation, pain near the anus) TENDERNESS IN MOUTH AND THROAT WITH OR WITHOUT PRESENCE OF ULCERS (sore throat, sores in mouth, or a toothache) UNUSUAL RASH, SWELLING OR PAIN  UNUSUAL VAGINAL DISCHARGE OR ITCHING   Items with * indicate a potential emergency and should be followed up as soon as possible or go to the Emergency Department if any problems should occur.  Please show the CHEMOTHERAPY ALERT CARD or IMMUNOTHERAPY ALERT CARD at check-in to the  Emergency Department and triage nurse.  Should you have questions after your visit or need to cancel or reschedule your appointment, please contact [facility] [phone]  and follow the prompts.  Office hours are 8:00 a.m. to 4:30 p.m. Monday - Friday. Please note that voicemails left after 4:00 p.m. may not be returned until the following business day.  We are closed weekends and major holidays. You have access to a nurse at all times for urgent questions. Please call the main number to the clinic [phone] and follow the prompts.  For any non-urgent questions, you may also contact your provider using MyChart. We now offer e-Visits for anyone 56 and older to request care online for non-urgent symptoms. For details visit [email or web address].   Also download the MyChart app! Go to the app store, search "MyChart", open the app, select [facility], and log in with your MyChart username and password.  Masks are optional in the cancer centers. If you would like for your care team to wear a mask while they are taking care of you, please let them know. For doctor visits, patients may have with them one support person who is at least 63 years old. At this time, visitors are not allowed in the infusion area.

## 2022-05-19 NOTE — Progress Notes (Signed)
Nutrition Follow-up:  Patient with cancer of GE junction. He is receiving concurrent chemoradiation therapy with weekly carboplatin and paclitaxel (started 7/27).   Met with patient in infusion. He reports poor appetite and fatigue lasting 2 days after last treatment. Patient endorses one episode of vomiting last Sunday. He is drinking one protein shake (240 kcal, 30 g protein). Patient reports appetite returned and has been eating well this week (pork fried rice, chicken pasta, 2-3 oz filet, 2-3 oz pork tenderloin). Patient denies constipation, diarrhea. He has not been walking this week due to weight loss.   Medications: compazine   Labs: glucose  Anthropometrics: Weight 201 lb 4.8 oz today   7/27 - 208 lb 11.2 oz   NUTRITION DIAGNOSIS: Food and nutrition related knowledge deficit continues   INTERVENTION:  Reinforced education on increased calorie/protein energy needs to maintain weights and strength Continue daily protein shakes, suggested switching to Ensure Plus/equivalent - recommend 2/day  Discussed ways to add calories to foods One case of Ensure Plus High Protein provided     MONITORING, EVALUATION, GOAL: weight trends, intake    NEXT VISIT: Thursday August 31

## 2022-05-19 NOTE — Progress Notes (Signed)
Patient presents today for chemotherapy infusion.  Patient is in satisfactory condition with no complaints voiced.  Vital signs are stable. Labs reviewed Dr. Delton Coombes during his office visit.. All labs are within treatment parameters.  We will proceed with treatment per MD orders.   Patient tolerated treatment well with no complaints voiced.  Patient left ambulatory in stable condition.  Vital signs stable at discharge.  Follow up as scheduled.

## 2022-05-19 NOTE — Progress Notes (Signed)
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  PRIOR THERAPY: none  NGS Results: not done  CURRENT THERAPY: Chemoradiation therapy with weekly carboplatin and paclitaxel  BRIEF ONCOLOGIC HISTORY:  Oncology History  GE junction carcinoma ([facility])  04/12/2022 Initial Diagnosis   GE junction carcinoma ([facility])   05/12/2022 -  Chemotherapy   Patient is on Treatment Plan : ESOPHAGUS Carboplatin/PACLitaxel weekly x 6 weeks with XRT         CANCER STAGING:  Cancer Staging  GE junction carcinoma ([place]) Staging form: Esophagus - Adenocarcinoma, AJCC 8th Edition - Clinical stage from 04/12/2022: Stage III (cT3, cN0, cM0, G3) - Unsigned   INTERVAL HISTORY:  [name], a 61 y.o. male, returns for routine follow-up and consideration for next cycle of chemotherapy. [name] [name] was last seen on 05/12/2022.  Due for day #8 cycle #1 of Carboplatin and Taxol today.   Overall, he tells me he has been feeling pretty well. He reports fatigue for 2 days following treatment and nausea for 1 day following treatment. He has lost 7 lbs since his last visit. He reports reduced appetite for 1 day since last visit. He is eating small meals, and he is not eating consist meals.   Overall, he feels ready for next cycle of chemo today.    REVIEW OF SYSTEMS:  Review of Systems  Constitutional:  Positive for unexpected weight change (-7 lbs). Negative for appetite change (resolved) and fatigue (resolved).  Gastrointestinal:  Negative for nausea (resolved).  All other systems reviewed and are negative.   PAST MEDICAL/SURGICAL HISTORY:  Past Medical History:  Diagnosis Date   Port-A-Cath in place 05/11/2022   Past Surgical History:  Procedure Laterality Date   BIOPSY  03/25/2022    Procedure: BIOPSY;  Surgeon: Harvel Quale, MD;  [facility];  Service: Gastroenterology;;  GE junction;   BIOPSY  04/25/2022   Procedure: BIOPSY;  Surgeon: Irving Copas., MD;  Location: [facility];  Service: Gastroenterology;;   COLONOSCOPY     COLONOSCOPY WITH PROPOFOL N/A 03/25/2022   Procedure: COLONOSCOPY WITH PROPOFOL;  Surgeon: Harvel Quale, MD;  [facility];  Service: Gastroenterology;  Laterality: N/A;  945 ASA 1   ESOPHAGOGASTRODUODENOSCOPY (EGD) WITH PROPOFOL N/A 03/25/2022   Procedure: ESOPHAGOGASTRODUODENOSCOPY (EGD) WITH PROPOFOL;  Surgeon: Harvel Quale, MD;  [facility];  Service: Gastroenterology;  Laterality: N/A;   ESOPHAGOGASTRODUODENOSCOPY (EGD) WITH PROPOFOL N/A 04/25/2022   Procedure: ESOPHAGOGASTRODUODENOSCOPY (EGD) WITH PROPOFOL;  Surgeon: Rush Landmark Telford Nab., MD;  Location: [facility];  Service: Gastroenterology;  Laterality: N/A;   IR IMAGING GUIDED PORT INSERTION  05/04/2022   UPPER ESOPHAGEAL ENDOSCOPIC ULTRASOUND (EUS) N/A 04/25/2022   Procedure: UPPER ESOPHAGEAL ENDOSCOPIC ULTRASOUND (EUS);  Surgeon: Irving Copas., MD;  Location: [facility];  Service: Gastroenterology;  Laterality: N/A;    SOCIAL HISTORY:  Social History   Socioeconomic History   Marital status: Married    Spouse name: Not on file   Number of children: Not on file   Years of education: Not on file   Highest education level: Not on file  Occupational History   Not on file  Tobacco Use   Smoking status: Never    Passive exposure: Past   Smokeless tobacco: Never  Vaping Use   Vaping Use: Never used  Substance and Sexual Activity   Alcohol use: Yes    Comment: 1 daily   Drug use: Never   Sexual activity: Yes  Other Topics Concern   Not on file  Social History Narrative   Not on file   Social Determinants of Health   Financial Resource Strain: Not on file  Food Insecurity: Not on  file  Transportation Needs: Not on file  Physical Activity: Not on file  Stress: Not on file  Social Connections: Not on file  Intimate Partner Violence: Not on file    FAMILY HISTORY:  Family History  Problem Relation Age of Onset   Cancer Maternal Grandmother        Breast cancer   Cancer Other        Maternal Cousin - breast cancer    CURRENT MEDICATIONS:  Current Outpatient Medications  Medication Sig Dispense Refill   CARBOPLATIN IV Inject into the vein once a week.     lidocaine-prilocaine (EMLA) cream Apply a small amount to port a cath site and cover with plastic wrap 1 hour prior to infusion appointments 30 g 3   PACLitaxel (TAXOL IV) Inject into the vein once a week.     prochlorperazine (COMPAZINE) 10 MG tablet Take 1 tablet (10 mg total) by mouth every 6 (six) hours as needed (Nausea or vomiting). 60 tablet 3   No current facility-administered medications for this visit.    ALLERGIES:  No Known Allergies  PHYSICAL EXAM:  Performance status (ECOG): 0 - Asymptomatic  There were no vitals filed for this visit. Wt Readings from Last 3 Encounters:  05/12/22 208 lb 11.2 oz (94.7 kg)  05/10/22 210 lb 4.8 oz (95.4 kg)  05/09/22 210 lb 2 oz (95.3 kg)   Physical Exam Vitals reviewed.  Constitutional:      Appearance: Normal appearance.  Cardiovascular:     Rate and Rhythm: Normal rate and regular rhythm.     Pulses: Normal pulses.     Heart sounds: Normal heart sounds.  Pulmonary:     Effort: Pulmonary effort is normal.     Breath sounds: Normal breath sounds.  Neurological:     General: No focal deficit present.     Mental Status: He is alert and oriented to person, place, and time.  Psychiatric:        Mood and Affect: Mood normal.        Behavior: Behavior normal.     LABORATORY DATA:  I have reviewed the labs as listed.     Latest Ref Rng & Units 05/12/2022    8:56 AM  CBC  WBC 4.0 - 10.5 K/uL 4.6   Hemoglobin 13.0 - 17.0 g/dL 13.5   Hematocrit  39.0 - 52.0 % 40.8   Platelets 150 - 400 K/uL 183       Latest Ref Rng & Units 05/12/2022    8:56 AM  CMP  Glucose 70 - 99 mg/dL 99   BUN 8 - 23 mg/dL 15   Creatinine 0.61 - 1.24 mg/dL 0.88   Sodium 135 - 145 mmol/L 138   Potassium 3.5 - 5.1 mmol/L 3.9   Chloride 98 - 111 mmol/L 105   CO2 22 - 32 mmol/L 27   Calcium 8.9 - 10.3 mg/dL 9.3   Total Protein 6.5 - 8.1 g/dL 6.8   Total Bilirubin 0.3 - 1.2 mg/dL 1.1   Alkaline Phos 38 - 126 U/L 60  AST 15 - 41 U/L 19   ALT 0 - 44 U/L 13     DIAGNOSTIC IMAGING:  I have independently reviewed the scans and discussed with the patient. IR IMAGING GUIDED PORT INSERTION  Result Date: 05/04/2022 INDICATION: 61 year old male with history of esophageal cancer requiring central venous access for chemotherapy administration. EXAM: IMPLANTED PORT A CATH PLACEMENT WITH ULTRASOUND AND FLUOROSCOPIC GUIDANCE COMPARISON:  None Available. MEDICATIONS: None. ANESTHESIA/SEDATION: Moderate (conscious) sedation was employed during this procedure. A total of Versed 1 mg and Fentanyl 50 mcg was administered intravenously. Moderate Sedation Time: 14 minutes. The patient's level of consciousness and vital signs were monitored continuously by radiology nursing throughout the procedure under my direct supervision. CONTRAST:  None FLUOROSCOPY TIME:  6 seconds, 1 mGy COMPLICATIONS: None immediate. PROCEDURE: The procedure, risks, benefits, and alternatives were explained to the patient. Questions regarding the procedure were encouraged and answered. The patient understands and consents to the procedure. The right neck and chest were prepped with chlorhexidine in a sterile fashion, and a sterile drape was applied covering the operative field. Maximum barrier sterile technique with sterile gowns and gloves were used for the procedure. A timeout was performed prior to the initiation of the procedure. Ultrasound was used to examine the jugular vein which was compressible and free  of internal echoes. A skin marker was used to demarcate the planned venotomy and port pocket incision sites. Local anesthesia was provided to these sites and the subcutaneous tunnel track with 1% lidocaine with 1:100,000 epinephrine. A small incision was created at the jugular access site and blunt dissection was performed of the subcutaneous tissues. Under ultrasound guidance, the jugular vein was accessed with a 21 ga micropuncture needle and an 0.018" wire was inserted to the superior vena cava. Real-time ultrasound guidance was utilized for vascular access including the acquisition of a permanent ultrasound image documenting patency of the accessed vessel. A 5 Fr micopuncture set was then used, through which a 0.035" Rosen wire was passed under fluoroscopic guidance into the inferior vena cava. An 8 Fr dilator was then placed over the wire. A subcutaneous port pocket was then created along the upper chest wall utilizing a combination of sharp and blunt dissection. The pocket was irrigated with sterile saline, packed with gauze, and observed for hemorrhage. A single lumen "ISP" sized power injectable port was chosen for placement. The 8 Fr catheter was tunneled from the port pocket site to the venotomy incision. The port was placed in the pocket. The external catheter was trimmed to appropriate length. The dilator was exchanged for an 8 Fr peel-away sheath under fluoroscopic guidance. The catheter was then placed through the sheath and the sheath was removed. Final catheter positioning was confirmed and documented with a fluoroscopic spot radiograph. The port was accessed with a Huber needle, aspirated, and flushed with heparinized saline. The deep dermal layer of the port pocket incision was closed with interrupted 3-0 Vicryl suture. Dermabond was then placed over the port pocket and neck incisions. The patient tolerated the procedure well without immediate post procedural complication. FINDINGS: After catheter  placement, the tip lies within the superior cavoatrial junction. The catheter aspirates and flushes normally and is ready for immediate use. IMPRESSION: Successful placement of a power injectable Port-A-Cath via the right internal jugular vein. The catheter is ready for immediate use. Ruthann Cancer, MD [facility] Radiology Electronically Signed   By: Ruthann Cancer M.D.   On: 05/04/2022 10:49  ASSESSMENT:  T2/3 N0 M0 GE Junction poorly differentiated adenocarcinoma: - Presentation with dysphagia to certain foods like bread and broccoli since February 2023.  He lost about 60 pounds since September, was trying to lose weight by walking 7 miles a day. - EGD (03/25/2022): Large ulcerating mass with no bleeding found at the GE junction, 40-42 cm from incisors.  Mass appeared to extend 1 cm distal towards the cardia.  Partially obstructing and circumferential.  Normal stomach and duodenum. 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 - CT CAP (03/30/2022): Thickened folds of the GE junction without discrete focal mass.  No upstream dilatation.  No intrathoracic, axillary or abdominal adenopathy.  5 mm right upper lobe solitary nodule.  2.4 cm densely calcified right thyroid nodule.  Mild hepatic steatosis. - PET CT scan (04/07/2022): Hypermetabolic area at the GE junction SUV 6.93.  No signs of adenopathy or suspicious lung nodules.  No sign of other metastatic disease.  Left thyroid lesion with moderately increased metabolic activity, SUV 5.18 - EGD/EUS (04/25/2022): Malignant esophageal tumor at distal esophagus extending into GE junction and into the cardia.  EUS staging T3N0.  No malignant appearing lymph nodes visualized.  Mass extends from 37 cm to 42 cm (esophageal portion is 37-40 cm).  Lesion was circumferential.  Sonographic evidence suggesting invasion into 1 through the muscularis propria.  Intact interface seen  between the mass and adjacent structures. - Dr. Kipp Brood recommended surgery between 6 to 10 weeks after completion of chemoradiation with repeat PET scan in 4 to 6 weeks after last dose of radiation. - Concurrent chemoradiation with carboplatin and paclitaxel followed by surgery and possibly adjuvant nivolumab for 1 year (Checkmate 577) if no complete pathological response is achieved. - Concurrent chemoradiation therapy started on 05/12/2022.    Social/family history: - Lives at home with his wife.  He is a retired Electronics engineer at [employer].  Non-smoker.  Drinks alcohol 5 days/week, moderate. - Maternal grandmother had breast cancer.  Maternal cousin had metastatic breast cancer.  2.  Incidental PET positive left thyroid nodule: - Biopsy of the right thyroid nodule on 05/21/1659, scant follicular epithelium, Bethesda category 1. - He will need work-up for left thyroid nodule after completion of treatments.   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 - Denies any tingling or numbness in extremities.  No pain on swallowing. - He reportedly had itching on the upper extremities 1 day after treatment. - Reviewed labs today with chemistries within normal limits.  Total bilirubin is 1.3.  CBC was normal. - Recommend proceeding with week 2 treatment today.  RTC 1 week for follow-up with repeat labs.  He was recommended to take Claritin tonight and tomorrow for the itching.  2.  Weight loss: - He lost about 9 pounds in the last 1 week. - She is drinking 1 protein drink per day.  He is grazing and not eating full meals. - We talked about starting on nutritional supplements like boost plus/Ensure Plus.  He will also talk to our dietitian today.   Orders placed this encounter:  No orders of the defined types were placed in this encounter.    [name] Jack,  MD [place] [phone]   I, Thana Ates, am acting as a scribe for Dr. Derek [name].  [name] Scales MD, have reviewed  the above documentation for accuracy and completeness, and I agree with the above.

## 2022-05-19 NOTE — Patient Instructions (Signed)
[place] at [facility] Discharge Instructions   You were seen and examined today by Dr. Delton Coombes.  He reviewed the results of your CBC which is normal. CMP results are pending.  You've lost 9 lbs since we saw you last. Incorporate Ensure Plus or Boost Plus into your diet. Try to get in 1800-2000 calories per day.   We will proceed with your treatment today.   Return as scheduled next week.    Thank you for choosing [place] at [facility] to provide your oncology and hematology care.  To afford each patient quality time with our provider, please arrive at least 15 minutes before your scheduled appointment time.   If you have a lab appointment with the [facility] please come in thru the Main Entrance and check in at the main information desk.  You need to re-schedule your appointment should you arrive 10 or more minutes late.  We strive to give you quality time with our providers, and arriving late affects you and other patients whose appointments are after yours.  Also, if you no show three or more times for appointments you may be dismissed from the clinic at the providers discretion.     Again, thank you for choosing [facility].  Our hope is that these requests will decrease the amount of time that you wait before being seen by our physicians.       _____________________________________________________________  Should you have questions after your visit to [facility], please contact our office at [phone] and follow the prompts.  Our office hours are 8:00 a.m. and 4:30 p.m. Monday - Friday.  Please note that voicemails left after 4:00 p.m. may not be returned until the following business day.  We are closed weekends and major holidays.  You do have access to a nurse 24-7, just call the main number to the clinic [phone] and do not press any options, hold on the line and a nurse will answer the  phone.    For prescription refill requests, have your pharmacy contact our office and allow 72 hours.    Due to Covid, you will need to wear a mask upon entering the hospital. If you do not have a mask, a mask will be given to you at the Main Entrance upon arrival. For doctor visits, patients may have 1 support person age 33 or older with them. For treatment visits, patients can not have anyone with them due to social distancing guidelines and our immunocompromised population.

## 2022-05-20 ENCOUNTER — Ambulatory Visit
Admission: RE | Admit: 2022-05-20 | Discharge: 2022-05-20 | Disposition: A | Discharge status: Home / Self Care | Payer: No Typology Code available for payment source | Source: Ambulatory Visit | Attending: Radiation Oncology | Admitting: Radiation Oncology

## 2022-05-20 ENCOUNTER — Other Ambulatory Visit: Payer: Self-pay

## 2022-05-20 DIAGNOSIS — Z5111 Encounter for antineoplastic chemotherapy: Secondary | ICD-10-CM | POA: Diagnosis not present

## 2022-05-20 LAB — RAD ONC ARIA SESSION SUMMARY
Course Elapsed Days: 8
Plan Fractions Treated to Date: 6
Plan Prescribed Dose Per Fraction: 1.8 Gy
Plan Total Fractions Prescribed: 25
Plan Total Prescribed Dose: 45 Gy
Reference Point Dosage Given to Date: 10.8 Gy
Reference Point Session Dosage Given: 1.8 Gy
Session Number: 6

## 2022-05-23 ENCOUNTER — Ambulatory Visit
Admission: RE | Admit: 2022-05-23 | Discharge: 2022-05-23 | Disposition: A | Discharge status: Home / Self Care | Payer: No Typology Code available for payment source | Source: Ambulatory Visit | Attending: Radiation Oncology | Admitting: Radiation Oncology

## 2022-05-23 ENCOUNTER — Other Ambulatory Visit: Payer: Self-pay

## 2022-05-23 DIAGNOSIS — Z5111 Encounter for antineoplastic chemotherapy: Secondary | ICD-10-CM | POA: Diagnosis not present

## 2022-05-23 LAB — RAD ONC ARIA SESSION SUMMARY
Course Elapsed Days: 11
Plan Fractions Treated to Date: 7
Plan Prescribed Dose Per Fraction: 1.8 Gy
Plan Total Fractions Prescribed: 25
Plan Total Prescribed Dose: 45 Gy
Reference Point Dosage Given to Date: 12.6 Gy
Reference Point Session Dosage Given: 1.8 Gy
Session Number: 7

## 2022-05-24 ENCOUNTER — Ambulatory Visit: Payer: No Typology Code available for payment source

## 2022-05-24 ENCOUNTER — Other Ambulatory Visit: Payer: Self-pay

## 2022-05-24 ENCOUNTER — Ambulatory Visit
Admission: RE | Admit: 2022-05-24 | Discharge: 2022-05-24 | Disposition: A | Discharge status: Home / Self Care | Payer: No Typology Code available for payment source | Source: Ambulatory Visit | Attending: Radiation Oncology | Admitting: Radiation Oncology

## 2022-05-24 DIAGNOSIS — Z5111 Encounter for antineoplastic chemotherapy: Secondary | ICD-10-CM | POA: Diagnosis not present

## 2022-05-24 LAB — RAD ONC ARIA SESSION SUMMARY
Course Elapsed Days: 12
Plan Fractions Treated to Date: 8
Plan Prescribed Dose Per Fraction: 1.8 Gy
Plan Total Fractions Prescribed: 25
Plan Total Prescribed Dose: 45 Gy
Reference Point Dosage Given to Date: 14.4 Gy
Reference Point Session Dosage Given: 1.8 Gy
Session Number: 8

## 2022-05-25 ENCOUNTER — Ambulatory Visit
Admission: RE | Admit: 2022-05-25 | Discharge: 2022-05-25 | Disposition: A | Discharge status: Home / Self Care | Payer: No Typology Code available for payment source | Source: Ambulatory Visit | Attending: Radiation Oncology | Admitting: Radiation Oncology

## 2022-05-25 ENCOUNTER — Other Ambulatory Visit: Payer: Self-pay

## 2022-05-25 DIAGNOSIS — Z5111 Encounter for antineoplastic chemotherapy: Secondary | ICD-10-CM | POA: Diagnosis not present

## 2022-05-25 LAB — RAD ONC ARIA SESSION SUMMARY
Course Elapsed Days: 13
Plan Fractions Treated to Date: 9
Plan Prescribed Dose Per Fraction: 1.8 Gy
Plan Total Fractions Prescribed: 25
Plan Total Prescribed Dose: 45 Gy
Reference Point Dosage Given to Date: 16.2 Gy
Reference Point Session Dosage Given: 1.8 Gy
Session Number: 9

## 2022-05-26 ENCOUNTER — Inpatient Hospital Stay: Payer: No Typology Code available for payment source

## 2022-05-26 ENCOUNTER — Other Ambulatory Visit: Payer: Self-pay

## 2022-05-26 ENCOUNTER — Inpatient Hospital Stay (HOSPITAL_BASED_OUTPATIENT_CLINIC_OR_DEPARTMENT_OTHER): Payer: No Typology Code available for payment source | Admitting: Hematology

## 2022-05-26 ENCOUNTER — Ambulatory Visit
Admission: RE | Admit: 2022-05-26 | Discharge: 2022-05-26 | Disposition: A | Discharge status: Home / Self Care | Payer: No Typology Code available for payment source | Source: Ambulatory Visit | Attending: Radiation Oncology | Admitting: Radiation Oncology

## 2022-05-26 VITALS — BP 96/67 | HR 74 | Temp 98.3°F | Resp 18

## 2022-05-26 DIAGNOSIS — Z95828 Presence of other vascular implants and grafts: Secondary | ICD-10-CM

## 2022-05-26 DIAGNOSIS — C160 Malignant neoplasm of cardia: Secondary | ICD-10-CM

## 2022-05-26 DIAGNOSIS — Z5111 Encounter for antineoplastic chemotherapy: Secondary | ICD-10-CM | POA: Diagnosis not present

## 2022-05-26 LAB — RAD ONC ARIA SESSION SUMMARY
Course Elapsed Days: 14
Plan Fractions Treated to Date: 10
Plan Prescribed Dose Per Fraction: 1.8 Gy
Plan Total Fractions Prescribed: 25
Plan Total Prescribed Dose: 45 Gy
Reference Point Dosage Given to Date: 18 Gy
Reference Point Session Dosage Given: 1.8 Gy
Session Number: 10

## 2022-05-26 LAB — CBC WITH DIFFERENTIAL/PLATELET
Abs Immature Granulocytes: 0.02 10*3/uL (ref 0.00–0.07)
Basophils Absolute: 0 10*3/uL (ref 0.0–0.1)
Basophils Relative: 0 %
Eosinophils Absolute: 0.1 10*3/uL (ref 0.0–0.5)
Eosinophils Relative: 1 %
HCT: 36.9 % — ABNORMAL LOW (ref 39.0–52.0)
Hemoglobin: 12.9 g/dL — ABNORMAL LOW (ref 13.0–17.0)
Immature Granulocytes: 1 %
Lymphocytes Relative: 11 %
Lymphs Abs: 0.4 10*3/uL — ABNORMAL LOW (ref 0.7–4.0)
MCH: 30.6 pg (ref 26.0–34.0)
MCHC: 35 g/dL (ref 30.0–36.0)
MCV: 87.6 fL (ref 80.0–100.0)
Monocytes Absolute: 0.2 10*3/uL (ref 0.1–1.0)
Monocytes Relative: 6 %
Neutro Abs: 2.9 10*3/uL (ref 1.7–7.7)
Neutrophils Relative %: 81 %
Platelets: 158 10*3/uL (ref 150–400)
RBC: 4.21 MIL/uL — ABNORMAL LOW (ref 4.22–5.81)
RDW: 13.3 % (ref 11.5–15.5)
WBC: 3.6 10*3/uL — ABNORMAL LOW (ref 4.0–10.5)
nRBC: 0 % (ref 0.0–0.2)

## 2022-05-26 LAB — COMPREHENSIVE METABOLIC PANEL
ALT: 15 U/L (ref 0–44)
AST: 17 U/L (ref 15–41)
Albumin: 4 g/dL (ref 3.5–5.0)
Alkaline Phosphatase: 56 U/L (ref 38–126)
Anion gap: 7 (ref 5–15)
BUN: 20 mg/dL (ref 8–23)
CO2: 25 mmol/L (ref 22–32)
Calcium: 9.7 mg/dL (ref 8.9–10.3)
Chloride: 102 mmol/L (ref 98–111)
Creatinine, Ser: 0.84 mg/dL (ref 0.61–1.24)
GFR, Estimated: >60 mL/min (ref >60)
Glucose, Bld: 112 mg/dL — ABNORMAL HIGH (ref 70–99)
Potassium: 4.3 mmol/L (ref 3.5–5.1)
Sodium: 134 mmol/L — ABNORMAL LOW (ref 135–145)
Total Bilirubin: 1.6 mg/dL — ABNORMAL HIGH (ref 0.3–1.2)
Total Protein: 7.1 g/dL (ref 6.5–8.1)

## 2022-05-26 LAB — MAGNESIUM: Magnesium: 1.8 mg/dL (ref 1.7–2.4)

## 2022-05-26 MED ORDER — DIPHENHYDRAMINE HCL 50 MG/ML IJ SOLN
50.0000 mg | Freq: Once | INTRAMUSCULAR | Status: AC
  Administered 2022-05-26: 50 mg via INTRAVENOUS

## 2022-05-26 MED ORDER — SODIUM CHLORIDE 0.9 % IV SOLN
10.0000 mg | Freq: Once | INTRAVENOUS | Status: AC
  Administered 2022-05-26: 10 mg via INTRAVENOUS
  Filled 2022-05-26: qty 10

## 2022-05-26 MED ORDER — FAMOTIDINE IN NACL 20-0.9 MG/50ML-% IV SOLN
20.0000 mg | Freq: Once | INTRAVENOUS | Status: AC
  Administered 2022-05-26: 20 mg via INTRAVENOUS

## 2022-05-26 MED ORDER — SODIUM CHLORIDE 0.9 % IV SOLN: Freq: Once | INTRAVENOUS | Status: AC

## 2022-05-26 MED ORDER — HEPARIN SOD (PORK) LOCK FLUSH 100 UNIT/ML IV SOLN
500.0000 [IU] | Freq: Once | INTRAVENOUS | Status: AC | PRN
  Administered 2022-05-26: 500 [IU]

## 2022-05-26 MED ORDER — SODIUM CHLORIDE 0.9 % IV SOLN: INTRAVENOUS | Status: DC

## 2022-05-26 MED ORDER — SODIUM CHLORIDE 0.9 % IV SOLN
294.6000 mg | Freq: Once | INTRAVENOUS | Status: AC
  Administered 2022-05-26: 290 mg via INTRAVENOUS
  Filled 2022-05-26: qty 29

## 2022-05-26 MED ORDER — PALONOSETRON HCL INJECTION 0.25 MG/5ML
0.2500 mg | Freq: Once | INTRAVENOUS | Status: AC
  Administered 2022-05-26: 0.25 mg via INTRAVENOUS

## 2022-05-26 MED ORDER — SODIUM CHLORIDE 0.9 % IV SOLN
50.0000 mg/m2 | Freq: Once | INTRAVENOUS | Status: AC
  Administered 2022-05-26: 108 mg via INTRAVENOUS
  Filled 2022-05-26: qty 18

## 2022-05-26 MED ORDER — SODIUM CHLORIDE 0.9% FLUSH
10.0000 mL | INTRAVENOUS | Status: DC | PRN
  Administered 2022-05-26: 10 mL

## 2022-05-26 NOTE — Patient Instructions (Signed)
[facility]  Discharge Instructions: Thank you for choosing [facility] to provide your oncology and hematology care.  If you have a lab appointment with the [facility], please come in thru the Main Entrance and check in at the main information desk.  Wear comfortable clothing and clothing appropriate for easy access to any Portacath or PICC line.   We strive to give you quality time with your provider. You may need to reschedule your appointment if you arrive late (15 or more minutes).  Arriving late affects you and other patients whose appointments are after yours.  Also, if you miss three or more appointments without notifying the office, you may be dismissed from the clinic at the provider's discretion.      For prescription refill requests, have your pharmacy contact our office and allow 72 hours for refills to be completed.    Today you received the following chemotherapy and/or immunotherapy agents Taxol and Carboplatin, return as scheduled.   To help prevent nausea and vomiting after your treatment, we encourage you to take your nausea medication as directed.  BELOW ARE SYMPTOMS THAT SHOULD BE REPORTED IMMEDIATELY: *FEVER GREATER THAN 100.4 F (38 C) OR HIGHER *CHILLS OR SWEATING *NAUSEA AND VOMITING THAT IS NOT CONTROLLED WITH YOUR NAUSEA MEDICATION *UNUSUAL SHORTNESS OF BREATH *UNUSUAL BRUISING OR BLEEDING *URINARY PROBLEMS (pain or burning when urinating, or frequent urination) *BOWEL PROBLEMS (unusual diarrhea, constipation, pain near the anus) TENDERNESS IN MOUTH AND THROAT WITH OR WITHOUT PRESENCE OF ULCERS (sore throat, sores in mouth, or a toothache) UNUSUAL RASH, SWELLING OR PAIN  UNUSUAL VAGINAL DISCHARGE OR ITCHING   Items with * indicate a potential emergency and should be followed up as soon as possible or go to the Emergency Department if any problems should occur.  Please show the CHEMOTHERAPY ALERT CARD or IMMUNOTHERAPY ALERT  CARD at check-in to the Emergency Department and triage nurse.  Should you have questions after your visit or need to cancel or reschedule your appointment, please contact [facility] [phone]  and follow the prompts.  Office hours are 8:00 a.m. to 4:30 p.m. Monday - Friday. Please note that voicemails left after 4:00 p.m. may not be returned until the following business day.  We are closed weekends and major holidays. You have access to a nurse at all times for urgent questions. Please call the main number to the clinic [phone] and follow the prompts.  For any non-urgent questions, you may also contact your provider using MyChart. We now offer e-Visits for anyone 63 and older to request care online for non-urgent symptoms. For details visit [email or web address].   Also download the MyChart app! Go to the app store, search "MyChart", open the app, select [facility], and log in with your MyChart username and password.  Masks are optional in the cancer centers. If you would like for your care team to wear a mask while they are taking care of you, please let them know. For doctor visits, patients may have with them one support person who is at least 63 years old. At this time, visitors are not allowed in the infusion area.

## 2022-05-26 NOTE — Progress Notes (Signed)
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  PRIOR THERAPY: none  NGS Results: not done  CURRENT THERAPY: Chemoradiation therapy with weekly carboplatin and paclitaxel  BRIEF ONCOLOGIC HISTORY:  Oncology History  GE junction carcinoma ([place])  04/12/2022 Initial Diagnosis   GE junction carcinoma ([place])   05/12/2022 -  Chemotherapy   Patient is on Treatment Plan : ESOPHAGUS Carboplatin/PACLitaxel weekly x 6 weeks with XRT         CANCER STAGING:  Cancer Staging  GE junction carcinoma ([place]) Staging form: Esophagus - Adenocarcinoma, AJCC 8th Edition - Clinical stage from 04/12/2022: Stage III (cT3, cN0, cM0, G3) - Unsigned   INTERVAL HISTORY:  [name], a 61 y.o. male, returns for next cycle of weekly chemotherapy with carboplatin and paclitaxel.  He has lost 3 pounds in the last 1 week.  2 days ago he developed lightheadedness on standing up.  Also reported upper chest tightness 2-3 times per day comes and goes.  He is drinking 3-4 bottles of 12 ounce water.  He had nausea x 1 day yesterday after eating a [employer] breakfast sandwich and had to throw up.  REVIEW OF SYSTEMS:  Review of Systems  Constitutional:  Positive for unexpected weight change (-3 lbs). Negative for appetite change and fatigue.  Respiratory:  Positive for chest tightness.   Gastrointestinal:  Positive for nausea (resolved).  Neurological:  Positive for dizziness.  All other systems reviewed and are negative.   PAST MEDICAL/SURGICAL HISTORY:  Past Medical History:  Diagnosis Date   Port-A-Cath in place 05/11/2022   Past Surgical History:  Procedure Laterality Date   BIOPSY  03/25/2022   Procedure: BIOPSY;  Surgeon: Harvel Quale, MD;  [facility];  Service: Gastroenterology;;  GE junction;   BIOPSY  04/25/2022   Procedure: BIOPSY;  Surgeon: Irving Copas., MD;  Location: [facility];  Service: Gastroenterology;;   COLONOSCOPY     COLONOSCOPY WITH PROPOFOL N/A 03/25/2022   Procedure: COLONOSCOPY WITH PROPOFOL;  Surgeon: Harvel Quale, MD;  [facility];  Service: Gastroenterology;  Laterality: N/A;  945 ASA 1   ESOPHAGOGASTRODUODENOSCOPY (EGD) WITH PROPOFOL N/A 03/25/2022   Procedure: ESOPHAGOGASTRODUODENOSCOPY (EGD) WITH PROPOFOL;  Surgeon: Harvel Quale, MD;  [facility];  Service: Gastroenterology;  Laterality: N/A;   ESOPHAGOGASTRODUODENOSCOPY (EGD) WITH PROPOFOL N/A 04/25/2022   Procedure: ESOPHAGOGASTRODUODENOSCOPY (EGD) WITH PROPOFOL;  Surgeon: Rush Landmark Telford Nab., MD;  Location: [facility];  Service: Gastroenterology;  Laterality: N/A;   IR IMAGING GUIDED PORT INSERTION  05/04/2022   UPPER ESOPHAGEAL ENDOSCOPIC ULTRASOUND (EUS) N/A 04/25/2022   Procedure: UPPER ESOPHAGEAL ENDOSCOPIC ULTRASOUND (EUS);  Surgeon: Irving Copas., MD;  Location: [facility];  Service: Gastroenterology;  Laterality: N/A;    SOCIAL HISTORY:  Social History   Socioeconomic History   Marital status: Married    Spouse name: Not on file   Number of children: Not on file   Years of education: Not on file   Highest education level: Not on file  Occupational History   Not on file  Tobacco Use   Smoking status: Never    Passive exposure: Past   Smokeless tobacco: Never  Vaping Use   Vaping Use: Never used  Substance and Sexual Activity  Alcohol use: Yes    Comment: 1 daily   Drug use: Never   Sexual activity: Yes  Other Topics Concern   Not on file  Social History Narrative   Not on file   Social Determinants of Health   Financial Resource Strain: Not on file  Food Insecurity: Not on file  Transportation Needs: Not on file  Physical Activity: Not on file  Stress:  Not on file  Social Connections: Not on file  Intimate Partner Violence: Not on file    FAMILY HISTORY:  Family History  Problem Relation Age of Onset   Cancer Maternal Grandmother        Breast cancer   Cancer Other        Maternal Cousin - breast cancer    CURRENT MEDICATIONS:  Current Outpatient Medications  Medication Sig Dispense Refill   CARBOPLATIN IV Inject into the vein once a week.     lidocaine-prilocaine (EMLA) cream Apply a small amount to port a cath site and cover with plastic wrap 1 hour prior to infusion appointments 30 g 3   PACLitaxel (TAXOL IV) Inject into the vein once a week.     prochlorperazine (COMPAZINE) 10 MG tablet Take 1 tablet (10 mg total) by mouth every 6 (six) hours as needed (Nausea or vomiting). 60 tablet 3   No current facility-administered medications for this visit.    ALLERGIES:  No Known Allergies  PHYSICAL EXAM:  Performance status (ECOG): 0 - Asymptomatic  There were no vitals filed for this visit. Wt Readings from Last 3 Encounters:  05/19/22 201 lb 4.8 oz (91.3 kg)  05/12/22 208 lb 11.2 oz (94.7 kg)  05/10/22 210 lb 4.8 oz (95.4 kg)   Physical Exam Vitals reviewed.  Constitutional:      Appearance: Normal appearance.  Cardiovascular:     Rate and Rhythm: Normal rate and regular rhythm.     Pulses: Normal pulses.     Heart sounds: Normal heart sounds.  Pulmonary:     Effort: Pulmonary effort is normal.     Breath sounds: Normal breath sounds.  Neurological:     General: No focal deficit present.     Mental Status: He is alert and oriented to person, place, and time.  Psychiatric:        Mood and Affect: Mood normal.        Behavior: Behavior normal.     LABORATORY DATA:  I have reviewed the labs as listed.     Latest Ref Rng & Units 05/19/2022    8:33 AM 05/12/2022    8:56 AM  CBC  WBC 4.0 - 10.5 K/uL 4.1  4.6   Hemoglobin 13.0 - 17.0 g/dL 13.2  13.5   Hematocrit 39.0 - 52.0 % 38.1  40.8   Platelets 150 - 400  K/uL 170  183       Latest Ref Rng & Units 05/19/2022    8:33 AM 05/12/2022    8:56 AM  CMP  Glucose 70 - 99 mg/dL 117  99   BUN 8 - 23 mg/dL 17  15   Creatinine 0.61 - 1.24 mg/dL 0.81  0.88   Sodium 135 - 145 mmol/L 136  138   Potassium 3.5 - 5.1 mmol/L 4.3  3.9   Chloride 98 - 111 mmol/L 104  105   CO2 22 - 32 mmol/L 28  27   Calcium 8.9 - 10.3 mg/dL 9.8  9.3   Total Protein 6.5 - 8.1 g/dL  7.2  6.8   Total Bilirubin 0.3 - 1.2 mg/dL 1.3  1.1   Alkaline Phos 38 - 126 U/L 55  60   AST 15 - 41 U/L 18  19   ALT 0 - 44 U/L 15  13     DIAGNOSTIC IMAGING:  I have independently reviewed the scans and discussed with the patient. IR IMAGING GUIDED PORT INSERTION  Result Date: 05/04/2022 INDICATION: 61 year old male with history of esophageal cancer requiring central venous access for chemotherapy administration. EXAM: IMPLANTED PORT A CATH PLACEMENT WITH ULTRASOUND AND FLUOROSCOPIC GUIDANCE COMPARISON:  None Available. MEDICATIONS: None. ANESTHESIA/SEDATION: Moderate (conscious) sedation was employed during this procedure. A total of Versed 1 mg and Fentanyl 50 mcg was administered intravenously. Moderate Sedation Time: 14 minutes. The patient's level of consciousness and vital signs were monitored continuously by radiology nursing throughout the procedure under my direct supervision. CONTRAST:  None FLUOROSCOPY TIME:  6 seconds, 1 mGy COMPLICATIONS: None immediate. PROCEDURE: The procedure, risks, benefits, and alternatives were explained to the patient. Questions regarding the procedure were encouraged and answered. The patient understands and consents to the procedure. The right neck and chest were prepped with chlorhexidine in a sterile fashion, and a sterile drape was applied covering the operative field. Maximum barrier sterile technique with sterile gowns and gloves were used for the procedure. A timeout was performed prior to the initiation of the procedure. Ultrasound was used to examine the  jugular vein which was compressible and free of internal echoes. A skin marker was used to demarcate the planned venotomy and port pocket incision sites. Local anesthesia was provided to these sites and the subcutaneous tunnel track with 1% lidocaine with 1:100,000 epinephrine. A small incision was created at the jugular access site and blunt dissection was performed of the subcutaneous tissues. Under ultrasound guidance, the jugular vein was accessed with a 21 ga micropuncture needle and an 0.018" wire was inserted to the superior vena cava. Real-time ultrasound guidance was utilized for vascular access including the acquisition of a permanent ultrasound image documenting patency of the accessed vessel. A 5 Fr micopuncture set was then used, through which a 0.035" Rosen wire was passed under fluoroscopic guidance into the inferior vena cava. An 8 Fr dilator was then placed over the wire. A subcutaneous port pocket was then created along the upper chest wall utilizing a combination of sharp and blunt dissection. The pocket was irrigated with sterile saline, packed with gauze, and observed for hemorrhage. A single lumen "ISP" sized power injectable port was chosen for placement. The 8 Fr catheter was tunneled from the port pocket site to the venotomy incision. The port was placed in the pocket. The external catheter was trimmed to appropriate length. The dilator was exchanged for an 8 Fr peel-away sheath under fluoroscopic guidance. The catheter was then placed through the sheath and the sheath was removed. Final catheter positioning was confirmed and documented with a fluoroscopic spot radiograph. The port was accessed with a Huber needle, aspirated, and flushed with heparinized saline. The deep dermal layer of the port pocket incision was closed with interrupted 3-0 Vicryl suture. Dermabond was then placed over the port pocket and neck incisions. The patient tolerated the procedure well without immediate post  procedural complication. FINDINGS: After catheter placement, the tip lies within the superior cavoatrial junction. The catheter aspirates and flushes normally and is ready for immediate use. IMPRESSION: Successful placement of a power injectable Port-A-Cath via the right internal jugular vein. The catheter is ready  for immediate use. Ruthann Cancer, MD [facility] Radiology Electronically Signed   By: Ruthann Cancer M.D.   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  He lost about 60 pounds since September, was trying to lose weight by walking 7 miles a day. - EGD (03/25/2022): Large ulcerating mass with no bleeding found at the GE junction, 40-42 cm from incisors.  Mass appeared to extend 1 cm distal towards the cardia.  Partially obstructing and circumferential.  Normal stomach and duodenum. 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 - CT CAP (03/30/2022): Thickened folds of the GE junction without discrete focal mass.  No upstream dilatation.  No intrathoracic, axillary or abdominal adenopathy.  5 mm right upper lobe solitary nodule.  2.4 cm densely calcified right thyroid nodule.  Mild hepatic steatosis. - PET CT scan (04/07/2022): Hypermetabolic area at the GE junction SUV 6.93.  No signs of adenopathy or suspicious lung nodules.  No sign of other metastatic disease.  Left thyroid lesion with moderately increased metabolic activity, SUV 5.73 - EGD/EUS (04/25/2022): Malignant esophageal tumor at distal esophagus extending into GE junction and into the cardia.  EUS staging T3N0.  No malignant appearing lymph nodes visualized.  Mass extends from 37 cm to 42 cm (esophageal portion is 37-40 cm).  Lesion was circumferential.  Sonographic evidence suggesting invasion into 1  through the muscularis propria.  Intact interface seen between the mass and adjacent structures. - Dr. Kipp Brood recommended surgery between 6 to 10 weeks after completion of chemoradiation with repeat PET scan in 4 to 6 weeks after last dose of radiation. - Concurrent chemoradiation with carboplatin and paclitaxel followed by surgery and possibly adjuvant nivolumab for 1 year (Checkmate 577) if no complete pathological response is achieved. - Concurrent chemoradiation therapy started on 05/12/2022.    Social/family history: - Lives at home with his wife.  He is a retired Electronics engineer at [employer].  Non-smoker.  Drinks alcohol 5 days/week, moderate. - Maternal grandmother had breast cancer.  Maternal cousin had metastatic breast cancer.  2.  Incidental PET positive left thyroid nodule: - Biopsy of the right thyroid nodule on 11/18/252, scant follicular epithelium, Bethesda category 1. - He will need work-up for left thyroid nodule after completion of treatments.   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  No itching reported in the upper extremities after last treatment. - Reviewed labs which showed normal LFTs with total bilirubin 1.6.  Electrolytes and creatinine normal.  White count is 3.6 with normal ANC. - Proceed with week 3 of treatment today. - He complained of feeling dizzy.  Her blood pressure is 103/74.  Will give 1 L of fluid with electrolytes today.  Recommend increasing oral fluid intake. - He will reportedly finish his radiation treatment on 06/22/2022.  We have scheduled him for PET scan on 07/21/2022.  RTC 1 week for follow-up with repeat labs and treatment.  2.  Weight loss: - He lost 9 pounds in the prior week and 3 pounds since last week. - He does not like any sweet drinks.  Recommend increased calorie intake of salty foods. - Recommend increase water intake of 80 to 100  ounces daily.   Orders placed this encounter:  No orders of the defined types were placed in this encounter.    Derek Jack, MD [place] [phone]

## 2022-05-26 NOTE — Progress Notes (Signed)
Patient okay for treatment today per Dr. Delton Coombes with an additional order for 1 L of normal saline. Patient tolerated chemotherapy with no complaints voiced. Side effects with management reviewed understanding verbalized. Port site clean and dry with no bruising or swelling noted at site. Good blood return noted before and after administration of chemotherapy. Band aid applied. Patient left in satisfactory condition with VSS and no s/s of distress noted.

## 2022-05-26 NOTE — Patient Instructions (Addendum)
[place] at [facility] Discharge Instructions   You were seen and examined today by Dr. Delton Coombes.  He reviewed the results of your lab work which are normal/stable.   You have lost an additional 3 lbs per our scales since last week. Try finding recipes for non-sweet/savory smoothies that will provide adequate calories and protein since you are not able to tolerate sweets (Ensure/Boost) right now.   Increase your water intake 80-100 ounces a day.   We will proceed with your treatment today.   Return as scheduled in 1 week.    Thank you for choosing [place] at [facility] to provide your oncology and hematology care.  To afford each patient quality time with our provider, please arrive at least 15 minutes before your scheduled appointment time.   If you have a lab appointment with the [place] please come in thru the Main Entrance and check in at the main information desk.  You need to re-schedule your appointment should you arrive 10 or more minutes late.  We strive to give you quality time with our providers, and arriving late affects you and other patients whose appointments are after yours.  Also, if you no show three or more times for appointments you may be dismissed from the clinic at the providers discretion.     Again, thank you for choosing [facility].  Our hope is that these requests will decrease the amount of time that you wait before being seen by our physicians.       _____________________________________________________________  Should you have questions after your visit to [facility], please contact our office at [phone] and follow the prompts.  Our office hours are 8:00 a.m. and 4:30 p.m. Monday - Friday.  Please note that voicemails left after 4:00 p.m. may not be returned until the following business day.  We are closed weekends and major holidays.  You do have access to a nurse  24-7, just call the main number to the clinic [phone] and do not press any options, hold on the line and a nurse will answer the phone.    For prescription refill requests, have your pharmacy contact our office and allow 72 hours.    Due to Covid, you will need to wear a mask upon entering the hospital. If you do not have a mask, a mask will be given to you at the Main Entrance upon arrival. For doctor visits, patients may have 1 support person age 80 or older with them. For treatment visits, patients can not have anyone with them due to social distancing guidelines and our immunocompromised population.

## 2022-05-27 ENCOUNTER — Ambulatory Visit
Admission: RE | Admit: 2022-05-27 | Discharge: 2022-05-27 | Disposition: A | Discharge status: Home / Self Care | Payer: No Typology Code available for payment source | Source: Ambulatory Visit | Attending: Radiation Oncology | Admitting: Radiation Oncology

## 2022-05-27 ENCOUNTER — Other Ambulatory Visit: Payer: Self-pay

## 2022-05-27 DIAGNOSIS — Z5111 Encounter for antineoplastic chemotherapy: Secondary | ICD-10-CM | POA: Diagnosis not present

## 2022-05-27 LAB — RAD ONC ARIA SESSION SUMMARY
Course Elapsed Days: 15
Plan Fractions Treated to Date: 11
Plan Prescribed Dose Per Fraction: 1.8 Gy
Plan Total Fractions Prescribed: 25
Plan Total Prescribed Dose: 45 Gy
Reference Point Dosage Given to Date: 19.8 Gy
Reference Point Session Dosage Given: 1.8 Gy
Session Number: 11

## 2022-05-30 ENCOUNTER — Other Ambulatory Visit: Payer: Self-pay

## 2022-05-30 ENCOUNTER — Ambulatory Visit
Admission: RE | Admit: 2022-05-30 | Discharge: 2022-05-30 | Disposition: A | Discharge status: Home / Self Care | Payer: No Typology Code available for payment source | Source: Ambulatory Visit | Attending: Radiation Oncology | Admitting: Radiation Oncology

## 2022-05-30 DIAGNOSIS — Z5111 Encounter for antineoplastic chemotherapy: Secondary | ICD-10-CM | POA: Diagnosis not present

## 2022-05-30 LAB — RAD ONC ARIA SESSION SUMMARY
Course Elapsed Days: 18
Plan Fractions Treated to Date: 12
Plan Prescribed Dose Per Fraction: 1.8 Gy
Plan Total Fractions Prescribed: 25
Plan Total Prescribed Dose: 45 Gy
Reference Point Dosage Given to Date: 21.6 Gy
Reference Point Session Dosage Given: 1.8 Gy
Session Number: 12

## 2022-05-31 ENCOUNTER — Ambulatory Visit
Admission: RE | Admit: 2022-05-31 | Discharge: 2022-05-31 | Disposition: A | Discharge status: Home / Self Care | Payer: No Typology Code available for payment source | Source: Ambulatory Visit | Attending: Radiation Oncology | Admitting: Radiation Oncology

## 2022-05-31 ENCOUNTER — Other Ambulatory Visit: Payer: Self-pay | Admitting: Oncology

## 2022-05-31 ENCOUNTER — Other Ambulatory Visit: Payer: Self-pay | Admitting: Radiation Oncology

## 2022-05-31 ENCOUNTER — Ambulatory Visit: Payer: No Typology Code available for payment source

## 2022-05-31 ENCOUNTER — Other Ambulatory Visit: Payer: Self-pay

## 2022-05-31 DIAGNOSIS — Z5111 Encounter for antineoplastic chemotherapy: Secondary | ICD-10-CM | POA: Diagnosis not present

## 2022-05-31 DIAGNOSIS — C160 Malignant neoplasm of cardia: Secondary | ICD-10-CM

## 2022-05-31 LAB — RAD ONC ARIA SESSION SUMMARY
Course Elapsed Days: 19
Plan Fractions Treated to Date: 13
Plan Prescribed Dose Per Fraction: 1.8 Gy
Plan Total Fractions Prescribed: 25
Plan Total Prescribed Dose: 45 Gy
Reference Point Dosage Given to Date: 23.4 Gy
Reference Point Session Dosage Given: 1.8 Gy
Session Number: 13

## 2022-05-31 MED ORDER — SUCRALFATE 1 G PO TABS: 1.0000 g | ORAL_TABLET | Freq: Three times a day (TID) | ORAL | 1 refills | Status: DC

## 2022-05-31 NOTE — Progress Notes (Signed)
Order placed signed and held for fluids.

## 2022-06-01 ENCOUNTER — Other Ambulatory Visit: Payer: Self-pay

## 2022-06-01 ENCOUNTER — Ambulatory Visit: Payer: No Typology Code available for payment source

## 2022-06-01 ENCOUNTER — Inpatient Hospital Stay: Payer: No Typology Code available for payment source

## 2022-06-01 ENCOUNTER — Ambulatory Visit
Admission: RE | Admit: 2022-06-01 | Discharge: 2022-06-01 | Disposition: A | Discharge status: Home / Self Care | Payer: No Typology Code available for payment source | Source: Ambulatory Visit | Attending: Radiation Oncology | Admitting: Radiation Oncology

## 2022-06-01 DIAGNOSIS — C160 Malignant neoplasm of cardia: Secondary | ICD-10-CM

## 2022-06-01 DIAGNOSIS — Z5111 Encounter for antineoplastic chemotherapy: Secondary | ICD-10-CM | POA: Diagnosis not present

## 2022-06-01 LAB — RAD ONC ARIA SESSION SUMMARY
Course Elapsed Days: 20
Plan Fractions Treated to Date: 14
Plan Prescribed Dose Per Fraction: 1.8 Gy
Plan Total Fractions Prescribed: 25
Plan Total Prescribed Dose: 45 Gy
Reference Point Dosage Given to Date: 25.2 Gy
Reference Point Session Dosage Given: 1.8 Gy
Session Number: 14

## 2022-06-01 MED ORDER — SODIUM CHLORIDE 0.9 % IV SOLN: INTRAVENOUS | Status: AC

## 2022-06-01 NOTE — Patient Instructions (Signed)
Rehydration, Adult Rehydration is the replacement of body fluids, salts, and minerals (electrolytes) that are lost during dehydration. Dehydration is when there is not enough water or other fluids in the body. This happens when you lose more fluids than you take in. Common causes of dehydration include: Not drinking enough fluids. This can occur when you are ill or doing activities that require a lot of energy, especially in hot weather. Conditions that cause loss of water or other fluids, such as diarrhea, vomiting, sweating, or urinating a lot. Other illnesses, such as fever or infection. Certain medicines, such as those that remove excess fluid from the body (diuretics). Symptoms of mild or moderate dehydration may include thirst, dry lips and mouth, and dizziness. Symptoms of severe dehydration may include increased heart rate, confusion, fainting, and not urinating. For severe dehydration, you may need to get fluids through an IV at the hospital. For mild or moderate dehydration, you can usually rehydrate at home by drinking certain fluids as told by your health care provider. What are the risks? Generally, rehydration is safe. However, taking in too much fluid (overhydration) can be a problem. This is rare. Overhydration can cause an electrolyte imbalance, kidney failure, or a decrease in salt (sodium) levels in the body. Supplies needed You will need an oral rehydration solution (ORS) if your health care provider tells you to use one. This is a drink to treat dehydration. It can be found in pharmacies and retail stores. How to rehydrate Fluids Follow instructions from your health care provider for rehydration. The kind of fluid and the amount you should drink depend on your condition. In general, you should choose drinks that you prefer. If told by your health care provider, drink an ORS. Make an ORS by following instructions on the package. Start by drinking small amounts, about  cup (120  mL) every 5-10 minutes. Slowly increase how much you drink until you have taken the amount recommended by your health care provider. Drink enough clear fluids to keep your urine pale yellow. If you were told to drink an ORS, finish it first, then start slowly drinking other clear fluids. Drink fluids such as: Water. This includes sparkling water and flavored water. Drinking only water can lead to having too little sodium in your body (hyponatremia). Follow the advice of your health care provider. Water from ice chips you suck on. Fruit juice with water you add to it (diluted). Sports drinks. Hot or cold herbal teas. Broth-based soups. Milk or milk products. Food Follow instructions from your health care provider about what to eat while you rehydrate. Your health care provider may recommend that you slowly begin eating regular foods in small amounts. Eat foods that contain a healthy balance of electrolytes, such as bananas, oranges, potatoes, tomatoes, and spinach. Avoid foods that are greasy or contain a lot of sugar. In some cases, you may get nutrition through a feeding tube that is passed through your nose and into your stomach (nasogastric tube, or NG tube). This may be done if you have uncontrolled vomiting or diarrhea. Beverages to avoid  Certain beverages may make dehydration worse. While you rehydrate, avoid drinking alcohol. How to tell if you are recovering from dehydration You may be recovering from dehydration if: You are urinating more often than before you started rehydrating. Your urine is pale yellow. Your energy level improves. You vomit less frequently. You have diarrhea less frequently. Your appetite improves or returns to normal. You feel less dizzy or less light-headed.   Your skin tone and color start to look more normal. Follow these instructions at home: Take over-the-counter and prescription medicines only as told by your health care provider. Do not take sodium  tablets. Doing this can lead to having too much sodium in your body (hypernatremia). Contact a health care provider if: You continue to have symptoms of mild or moderate dehydration, such as: Thirst. Dry lips. Slightly dry mouth. Dizziness. Dark urine or less urine than normal. Muscle cramps. You continue to vomit or have diarrhea. Get help right away if you: Have symptoms of dehydration that get worse. Have a fever. Have a severe headache. Have been vomiting and the following happens: Your vomiting gets worse or does not go away. Your vomit includes blood or green matter (bile). You cannot eat or drink without vomiting. Have problems with urination or bowel movements, such as: Diarrhea that gets worse or does not go away. Blood in your stool (feces). This may cause stool to look black and tarry. Not urinating, or urinating only a small amount of very dark urine, within 6-8 hours. Have trouble breathing. Have symptoms that get worse with treatment. These symptoms may represent a serious problem that is an emergency. Do not wait to see if the symptoms will go away. Get medical help right away. Call your local emergency services (911 in the [place] Do not drive yourself to the hospital. Summary Rehydration is the replacement of body fluids and minerals (electrolytes) that are lost during dehydration. Follow instructions from your health care provider for rehydration. The kind of fluid and amount you should drink depend on your condition. Slowly increase how much you drink until you have taken the amount recommended by your health care provider. Contact your health care provider if you continue to show signs of mild or moderate dehydration. This information is not intended to replace advice given to you by your health care provider. Make sure you discuss any questions you have with your health care provider. Document Revised: 12/04/2019 Document Reviewed: 10/14/2019 Elsevier Patient  Education  2023 [employer]  

## 2022-06-02 ENCOUNTER — Ambulatory Visit
Admission: RE | Admit: 2022-06-02 | Discharge: 2022-06-02 | Disposition: A | Discharge status: Home / Self Care | Payer: No Typology Code available for payment source | Source: Ambulatory Visit | Attending: Radiation Oncology | Admitting: Radiation Oncology

## 2022-06-02 ENCOUNTER — Other Ambulatory Visit: Payer: Self-pay

## 2022-06-02 ENCOUNTER — Inpatient Hospital Stay (HOSPITAL_COMMUNITY): Payer: No Typology Code available for payment source | Attending: Hematology

## 2022-06-02 ENCOUNTER — Inpatient Hospital Stay (HOSPITAL_BASED_OUTPATIENT_CLINIC_OR_DEPARTMENT_OTHER): Payer: No Typology Code available for payment source | Admitting: Hematology

## 2022-06-02 ENCOUNTER — Inpatient Hospital Stay: Payer: No Typology Code available for payment source

## 2022-06-02 VITALS — BP 102/69 | HR 68 | Temp 98.4°F | Resp 18

## 2022-06-02 DIAGNOSIS — C160 Malignant neoplasm of cardia: Secondary | ICD-10-CM

## 2022-06-02 DIAGNOSIS — Z5111 Encounter for antineoplastic chemotherapy: Secondary | ICD-10-CM | POA: Diagnosis not present

## 2022-06-02 DIAGNOSIS — Z95828 Presence of other vascular implants and grafts: Secondary | ICD-10-CM

## 2022-06-02 LAB — COMPREHENSIVE METABOLIC PANEL
ALT: 12 U/L (ref 0–44)
AST: 16 U/L (ref 15–41)
Albumin: 3.7 g/dL (ref 3.5–5.0)
Alkaline Phosphatase: 46 U/L (ref 38–126)
Anion gap: 6 (ref 5–15)
BUN: 14 mg/dL (ref 8–23)
CO2: 25 mmol/L (ref 22–32)
Calcium: 9.4 mg/dL (ref 8.9–10.3)
Chloride: 105 mmol/L (ref 98–111)
Creatinine, Ser: 0.79 mg/dL (ref 0.61–1.24)
GFR, Estimated: >60 mL/min (ref >60)
Glucose, Bld: 106 mg/dL — ABNORMAL HIGH (ref 70–99)
Potassium: 4 mmol/L (ref 3.5–5.1)
Sodium: 136 mmol/L (ref 135–145)
Total Bilirubin: 0.8 mg/dL (ref 0.3–1.2)
Total Protein: 6.5 g/dL (ref 6.5–8.1)

## 2022-06-02 LAB — CBC WITH DIFFERENTIAL/PLATELET
Abs Immature Granulocytes: 0.01 10*3/uL (ref 0.00–0.07)
Basophils Absolute: 0 10*3/uL (ref 0.0–0.1)
Basophils Relative: 1 %
Eosinophils Absolute: 0 10*3/uL (ref 0.0–0.5)
Eosinophils Relative: 1 %
HCT: 32.3 % — ABNORMAL LOW (ref 39.0–52.0)
Hemoglobin: 11.3 g/dL — ABNORMAL LOW (ref 13.0–17.0)
Immature Granulocytes: 0 %
Lymphocytes Relative: 11 %
Lymphs Abs: 0.3 10*3/uL — ABNORMAL LOW (ref 0.7–4.0)
MCH: 31.2 pg (ref 26.0–34.0)
MCHC: 35 g/dL (ref 30.0–36.0)
MCV: 89.2 fL (ref 80.0–100.0)
Monocytes Absolute: 0.3 10*3/uL (ref 0.1–1.0)
Monocytes Relative: 9 %
Neutro Abs: 2.3 10*3/uL (ref 1.7–7.7)
Neutrophils Relative %: 78 %
Platelets: 151 10*3/uL (ref 150–400)
RBC: 3.62 MIL/uL — ABNORMAL LOW (ref 4.22–5.81)
RDW: 13.8 % (ref 11.5–15.5)
WBC: 3 10*3/uL — ABNORMAL LOW (ref 4.0–10.5)
nRBC: 0 % (ref 0.0–0.2)

## 2022-06-02 LAB — RAD ONC ARIA SESSION SUMMARY
Course Elapsed Days: 21
Plan Fractions Treated to Date: 15
Plan Prescribed Dose Per Fraction: 1.8 Gy
Plan Total Fractions Prescribed: 25
Plan Total Prescribed Dose: 45 Gy
Reference Point Dosage Given to Date: 27 Gy
Reference Point Session Dosage Given: 1.8 Gy
Session Number: 15

## 2022-06-02 LAB — MAGNESIUM: Magnesium: 1.9 mg/dL (ref 1.7–2.4)

## 2022-06-02 MED ORDER — SODIUM CHLORIDE 0.9 % IV SOLN
300.0000 mg | Freq: Once | INTRAVENOUS | Status: AC
  Administered 2022-06-02: 300 mg via INTRAVENOUS
  Filled 2022-06-02: qty 30

## 2022-06-02 MED ORDER — FAMOTIDINE IN NACL 20-0.9 MG/50ML-% IV SOLN
20.0000 mg | Freq: Once | INTRAVENOUS | Status: AC
  Administered 2022-06-02: 20 mg via INTRAVENOUS
  Filled 2022-06-02: qty 50

## 2022-06-02 MED ORDER — LIDOCAINE VISCOUS HCL 2 % MT SOLN: 15.0000 mL | OROMUCOSAL | 5 refills | Status: DC | PRN

## 2022-06-02 MED ORDER — SODIUM CHLORIDE 0.9 % IV SOLN
10.0000 mg | Freq: Once | INTRAVENOUS | Status: AC
  Administered 2022-06-02: 10 mg via INTRAVENOUS
  Filled 2022-06-02: qty 1

## 2022-06-02 MED ORDER — SODIUM CHLORIDE 0.9% FLUSH
10.0000 mL | INTRAVENOUS | Status: DC | PRN
  Administered 2022-06-02: 10 mL

## 2022-06-02 MED ORDER — PALONOSETRON HCL INJECTION 0.25 MG/5ML
0.2500 mg | Freq: Once | INTRAVENOUS | Status: AC
  Administered 2022-06-02: 0.25 mg via INTRAVENOUS
  Filled 2022-06-02: qty 5

## 2022-06-02 MED ORDER — SODIUM CHLORIDE 0.9 % IV SOLN
50.0000 mg/m2 | Freq: Once | INTRAVENOUS | Status: AC
  Administered 2022-06-02: 108 mg via INTRAVENOUS
  Filled 2022-06-02: qty 18

## 2022-06-02 MED ORDER — SODIUM CHLORIDE 0.9 % IV SOLN: INTRAVENOUS | Status: DC

## 2022-06-02 MED ORDER — SODIUM CHLORIDE 0.9 % IV SOLN: Freq: Once | INTRAVENOUS | Status: AC

## 2022-06-02 MED ORDER — DIPHENHYDRAMINE HCL 50 MG/ML IJ SOLN
50.0000 mg | Freq: Once | INTRAMUSCULAR | Status: AC
  Administered 2022-06-02: 50 mg via INTRAVENOUS
  Filled 2022-06-02: qty 1

## 2022-06-02 MED ORDER — HEPARIN SOD (PORK) LOCK FLUSH 100 UNIT/ML IV SOLN
500.0000 [IU] | Freq: Once | INTRAVENOUS | Status: AC | PRN
  Administered 2022-06-02: 500 [IU]

## 2022-06-02 NOTE — Progress Notes (Signed)
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  PRIOR THERAPY: none  NGS Results: not done  CURRENT THERAPY: Chemoradiation therapy with weekly carboplatin and paclitaxel  BRIEF ONCOLOGIC HISTORY:  Oncology History  GE junction carcinoma ([place])  04/12/2022 Initial Diagnosis   GE junction carcinoma ([place])   05/12/2022 -  Chemotherapy   Patient is on Treatment Plan : ESOPHAGUS Carboplatin/PACLitaxel weekly x 6 weeks with XRT         CANCER STAGING:  Cancer Staging  GE junction carcinoma ([place]) Staging form: Esophagus - Adenocarcinoma, AJCC 8th Edition - Clinical stage from 04/12/2022: Stage III (cT3, cN0, cM0, G3) - Unsigned   INTERVAL HISTORY:  [name] [name], a 61 y.o. male, returns for follow-up of GE junction adenocarcinoma.  He is here for toxicity assessment and neck cycle of chemotherapy.  Reports some pain on swallowing mostly in the retrosternal region.  Also has some intermittent nausea.  He lost 2 pounds since last visit.  He received 1 L of normal saline yesterday and felt better.  Dr. Sondra Come has given him sucralfate which is helping with swallowing.  REVIEW OF SYSTEMS:  Review of Systems  Constitutional:  Positive for unexpected weight change (-2 pounds). Negative for appetite change and fatigue.  HENT:   Positive for trouble swallowing.   Gastrointestinal:  Positive for nausea (resolved).  Neurological:  Positive for dizziness.  All other systems reviewed and are negative.   PAST MEDICAL/SURGICAL HISTORY:  Past Medical History:  Diagnosis Date   Port-A-Cath in place 05/11/2022   Past Surgical History:  Procedure Laterality Date   BIOPSY  03/25/2022   Procedure: BIOPSY;  Surgeon: Harvel Quale, MD;  [facility];  Service: Gastroenterology;;  GE junction;   BIOPSY  04/25/2022   Procedure: BIOPSY;  Surgeon: Irving Copas., MD;  Location: [facility];  Service: Gastroenterology;;   COLONOSCOPY     COLONOSCOPY WITH PROPOFOL N/A 03/25/2022   Procedure: COLONOSCOPY WITH PROPOFOL;  Surgeon: Harvel Quale, MD;  [facility];  Service: Gastroenterology;  Laterality: N/A;  945 ASA 1   ESOPHAGOGASTRODUODENOSCOPY (EGD) WITH PROPOFOL N/A 03/25/2022   Procedure: ESOPHAGOGASTRODUODENOSCOPY (EGD) WITH PROPOFOL;  Surgeon: Harvel Quale, MD;  [facility];  Service: Gastroenterology;  Laterality: N/A;   ESOPHAGOGASTRODUODENOSCOPY (EGD) WITH PROPOFOL N/A 04/25/2022   Procedure: ESOPHAGOGASTRODUODENOSCOPY (EGD) WITH PROPOFOL;  Surgeon: Rush Landmark Telford Nab., MD;  Location: [facility];  Service: Gastroenterology;  Laterality: N/A;   IR IMAGING GUIDED PORT INSERTION  05/04/2022   UPPER ESOPHAGEAL ENDOSCOPIC ULTRASOUND (EUS) N/A 04/25/2022   Procedure: UPPER ESOPHAGEAL ENDOSCOPIC ULTRASOUND (EUS);  Surgeon: Irving Copas., MD;  Location: [facility];  Service: Gastroenterology;  Laterality: N/A;    SOCIAL HISTORY:  Social History   Socioeconomic History   Marital status: Married    Spouse name: Not on file   Number of children: Not on file   Years of education: Not on file   Highest education level: Not on file  Occupational History   Not on file  Tobacco Use   Smoking status: Never    Passive exposure: Past   Smokeless tobacco: Never  Vaping Use   Vaping Use: Never used  Substance and Sexual Activity   Alcohol use: Yes  Comment: 1 daily   Drug use: Never   Sexual activity: Yes  Other Topics Concern   Not on file  Social History Narrative   Not on file   Social Determinants of Health   Financial Resource Strain: Not on file  Food Insecurity: Not on file  Transportation Needs: Not on file  Physical Activity: Not on file  Stress:  Not on file  Social Connections: Not on file  Intimate Partner Violence: Not on file    FAMILY HISTORY:  Family History  Problem Relation Age of Onset   Cancer Maternal Grandmother        Breast cancer   Cancer Other        Maternal Cousin - breast cancer    CURRENT MEDICATIONS:  Current Outpatient Medications  Medication Sig Dispense Refill   CARBOPLATIN IV Inject into the vein once a week.     lidocaine (XYLOCAINE) 2 % solution Use as directed 15 mLs in the mouth or throat every 4 (four) hours as needed for mouth pain. 100 mL 5   PACLitaxel (TAXOL IV) Inject into the vein once a week.     prochlorperazine (COMPAZINE) 10 MG tablet Take 1 tablet (10 mg total) by mouth every 6 (six) hours as needed (Nausea or vomiting). 60 tablet 3   sucralfate (CARAFATE) 1 g tablet Take 1 tablet (1 g total) by mouth 4 (four) times daily -  with meals and at bedtime. Crush and dissolve in 10 mL of warm water prior to swallowing, take 20 minutes before meals. 120 tablet 1   lidocaine-prilocaine (EMLA) cream Apply a small amount to port a cath site and cover with plastic wrap 1 hour prior to infusion appointments (Patient not taking: Reported on 06/02/2022) 30 g 3   No current facility-administered medications for this visit.   Facility-Administered Medications Ordered in Other Visits  Medication Dose Route Frequency Provider Last Rate Last Admin   0.9 %  sodium chloride infusion   Intravenous Continuous Derek Jack, MD   Stopped at 06/02/22 1032   sodium chloride flush (NS) 0.9 % injection 10 mL  10 mL Intracatheter PRN Derek Jack, MD   10 mL at 06/02/22 1349    ALLERGIES:  No Known Allergies  PHYSICAL EXAM:  Performance status (ECOG): 0 - Asymptomatic  There were no vitals filed for this visit. Wt Readings from Last 3 Encounters:  06/02/22 196 lb 4.8 oz (89 kg)  05/26/22 198 lb 9.6 oz (90.1 kg)  05/19/22 201 lb 4.8 oz (91.3 kg)   Physical Exam Vitals reviewed.   Constitutional:      Appearance: Normal appearance.  Cardiovascular:     Rate and Rhythm: Normal rate and regular rhythm.     Pulses: Normal pulses.     Heart sounds: Normal heart sounds.  Pulmonary:     Effort: Pulmonary effort is normal.     Breath sounds: Normal breath sounds.  Neurological:     General: No focal deficit present.     Mental Status: He is alert and oriented to person, place, and time.  Psychiatric:        Mood and Affect: Mood normal.        Behavior: Behavior normal.     LABORATORY DATA:  I have reviewed the labs as listed.     Latest Ref Rng & Units 06/02/2022    8:50 AM 05/26/2022    9:56 AM 05/19/2022    8:33 AM  CBC  WBC 4.0 - 10.5  K/uL 3.0  3.6  4.1   Hemoglobin 13.0 - 17.0 g/dL 11.3  12.9  13.2   Hematocrit 39.0 - 52.0 % 32.3  36.9  38.1   Platelets 150 - 400 K/uL 151  158  170       Latest Ref Rng & Units 06/02/2022    8:50 AM 05/26/2022    9:56 AM 05/19/2022    8:33 AM  CMP  Glucose 70 - 99 mg/dL 106  112  117   BUN 8 - 23 mg/dL '14  20  17   '$ Creatinine 0.61 - 1.24 mg/dL 0.79  0.84  0.81   Sodium 135 - 145 mmol/L 136  134  136   Potassium 3.5 - 5.1 mmol/L 4.0  4.3  4.3   Chloride 98 - 111 mmol/L 105  102  104   CO2 22 - 32 mmol/L '25  25  28   '$ Calcium 8.9 - 10.3 mg/dL 9.4  9.7  9.8   Total Protein 6.5 - 8.1 g/dL 6.5  7.1  7.2   Total Bilirubin 0.3 - 1.2 mg/dL 0.8  1.6  1.3   Alkaline Phos 38 - 126 U/L 46  56  55   AST 15 - 41 U/L '16  17  18   '$ ALT 0 - 44 U/L '12  15  15     '$ DIAGNOSTIC IMAGING:  I have independently reviewed the scans and discussed with the patient. IR IMAGING GUIDED PORT INSERTION  Result Date: 05/04/2022 INDICATION: 61 year old male with history of esophageal cancer requiring central venous access for chemotherapy administration. EXAM: IMPLANTED PORT A CATH PLACEMENT WITH ULTRASOUND AND FLUOROSCOPIC GUIDANCE COMPARISON:  None Available. MEDICATIONS: None. ANESTHESIA/SEDATION: Moderate (conscious) sedation was employed during  this procedure. A total of Versed 1 mg and Fentanyl 50 mcg was administered intravenously. Moderate Sedation Time: 14 minutes. The patient's level of consciousness and vital signs were monitored continuously by radiology nursing throughout the procedure under my direct supervision. CONTRAST:  None FLUOROSCOPY TIME:  6 seconds, 1 mGy COMPLICATIONS: None immediate. PROCEDURE: The procedure, risks, benefits, and alternatives were explained to the patient. Questions regarding the procedure were encouraged and answered. The patient understands and consents to the procedure. The right neck and chest were prepped with chlorhexidine in a sterile fashion, and a sterile drape was applied covering the operative field. Maximum barrier sterile technique with sterile gowns and gloves were used for the procedure. A timeout was performed prior to the initiation of the procedure. Ultrasound was used to examine the jugular vein which was compressible and free of internal echoes. A skin marker was used to demarcate the planned venotomy and port pocket incision sites. Local anesthesia was provided to these sites and the subcutaneous tunnel track with 1% lidocaine with 1:100,000 epinephrine. A small incision was created at the jugular access site and blunt dissection was performed of the subcutaneous tissues. Under ultrasound guidance, the jugular vein was accessed with a 21 ga micropuncture needle and an 0.018" wire was inserted to the superior vena cava. Real-time ultrasound guidance was utilized for vascular access including the acquisition of a permanent ultrasound image documenting patency of the accessed vessel. A 5 Fr micopuncture set was then used, through which a 0.035" Rosen wire was passed under fluoroscopic guidance into the inferior vena cava. An 8 Fr dilator was then placed over the wire. A subcutaneous port pocket was then created along the upper chest wall utilizing a combination of sharp and blunt dissection. The pocket  was irrigated with sterile saline, packed with gauze, and observed for hemorrhage. A single lumen "ISP" sized power injectable port was chosen for placement. The 8 Fr catheter was tunneled from the port pocket site to the venotomy incision. The port was placed in the pocket. The external catheter was trimmed to appropriate length. The dilator was exchanged for an 8 Fr peel-away sheath under fluoroscopic guidance. The catheter was then placed through the sheath and the sheath was removed. Final catheter positioning was confirmed and documented with a fluoroscopic spot radiograph. The port was accessed with a Huber needle, aspirated, and flushed with heparinized saline. The deep dermal layer of the port pocket incision was closed with interrupted 3-0 Vicryl suture. Dermabond was then placed over the port pocket and neck incisions. The patient tolerated the procedure well without immediate post procedural complication. FINDINGS: After catheter placement, the tip lies within the superior cavoatrial junction. The catheter aspirates and flushes normally and is ready for immediate use. IMPRESSION: Successful placement of a power injectable Port-A-Cath via the right internal jugular vein. The catheter is ready for immediate use. Ruthann Cancer, MD [facility] Radiology Electronically Signed   By: Ruthann Cancer M.D.   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  He lost about 60 pounds since September, was trying to lose weight by walking 7 miles a day. - EGD (03/25/2022): Large ulcerating mass with no bleeding found at the GE junction, 40-42 cm from incisors.  Mass appeared to extend 1 cm distal towards the cardia.  Partially obstructing and circumferential.  Normal stomach and duodenum. 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 - CT CAP (03/30/2022): Thickened folds of the GE junction without discrete focal mass.  No upstream dilatation.  No intrathoracic, axillary or abdominal adenopathy.  5 mm right upper lobe solitary nodule.  2.4 cm densely calcified right thyroid nodule.  Mild hepatic steatosis. - PET CT scan (04/07/2022): Hypermetabolic area at the GE junction SUV 6.93.  No signs of adenopathy or suspicious lung nodules.  No sign of other metastatic disease.  Left thyroid lesion with moderately increased metabolic activity, SUV 7.78 - EGD/EUS (04/25/2022): Malignant esophageal tumor at distal esophagus extending into GE junction and into the cardia.  EUS staging T3N0.  No malignant appearing lymph nodes visualized.  Mass extends from 37 cm to 42 cm (esophageal portion is 37-40 cm).  Lesion was circumferential.  Sonographic evidence suggesting invasion into 1 through the muscularis propria.  Intact interface seen between the mass and adjacent structures. - Dr. Kipp Brood recommended surgery between 6 to 10 weeks after completion of chemoradiation with repeat PET scan in 4 to 6 weeks after last dose of radiation. - Concurrent chemoradiation with carboplatin and paclitaxel followed by surgery and possibly adjuvant nivolumab for 1 year (Checkmate 577) if no complete pathological response is achieved. - Concurrent chemoradiation therapy started on 05/12/2022.    Social/family history: - Lives at home with his wife.  He is a retired Electronics engineer at [employer].  Non-smoker.  Drinks alcohol 5 days/week, moderate. - Maternal grandmother had breast cancer.  Maternal cousin had metastatic breast cancer.  2.  Incidental PET positive left thyroid nodule: - Biopsy of the right thyroid nodule on 11/21/2351, scant follicular epithelium, Bethesda category 1. - He will need work-up for left thyroid nodule  after completion of treatments.   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  I have reviewed his labs today which showed normal UltraLite signs LFTs.  CBC was normal except mild leukopenia and normal ANC.  He will proceed with week 4 of treatment today.  RTC 1 week for follow-up.  2.  Weight loss: - He lost total of 14 pounds since the start of therapy, including 2 pounds since last week.        - Continue sucralfate as needed which is helping.  He is eating 2-3 times small meals per day.  Also drinking 1 to 2 cans of Premier protein per day.  3.  Dizziness: - We will arrange him for 1 L normal saline on 06/06/2022.  He will also receive 1 L normal saline today.   Orders placed this encounter:  No orders of the defined types were placed in this encounter.    Derek Jack, MD [place] [phone]

## 2022-06-02 NOTE — Patient Instructions (Signed)
[facility]  Discharge Instructions: Thank you for choosing [facility] to provide your oncology and hematology care.  If you have a lab appointment with the [facility], please come in thru the Main Entrance and check in at the main information desk.  Wear comfortable clothing and clothing appropriate for easy access to any Portacath or PICC line.   We strive to give you quality time with your provider. You may need to reschedule your appointment if you arrive late (15 or more minutes).  Arriving late affects you and other patients whose appointments are after yours.  Also, if you miss three or more appointments without notifying the office, you may be dismissed from the clinic at the provider's discretion.      For prescription refill requests, have your pharmacy contact our office and allow 72 hours for refills to be completed.    Today you received the following chemotherapy and/or immunotherapy agents Taxol and Carboplatin      To help prevent nausea and vomiting after your treatment, we encourage you to take your nausea medication as directed.  BELOW ARE SYMPTOMS THAT SHOULD BE REPORTED IMMEDIATELY: *FEVER GREATER THAN 100.4 F (38 C) OR HIGHER *CHILLS OR SWEATING *NAUSEA AND VOMITING THAT IS NOT CONTROLLED WITH YOUR NAUSEA MEDICATION *UNUSUAL SHORTNESS OF BREATH *UNUSUAL BRUISING OR BLEEDING *URINARY PROBLEMS (pain or burning when urinating, or frequent urination) *BOWEL PROBLEMS (unusual diarrhea, constipation, pain near the anus) TENDERNESS IN MOUTH AND THROAT WITH OR WITHOUT PRESENCE OF ULCERS (sore throat, sores in mouth, or a toothache) UNUSUAL RASH, SWELLING OR PAIN  UNUSUAL VAGINAL DISCHARGE OR ITCHING   Items with * indicate a potential emergency and should be followed up as soon as possible or go to the Emergency Department if any problems should occur.  Please show the CHEMOTHERAPY ALERT CARD or IMMUNOTHERAPY ALERT CARD at check-in to  the Emergency Department and triage nurse.  Should you have questions after your visit or need to cancel or reschedule your appointment, please contact [facility] [phone]  and follow the prompts.  Office hours are 8:00 a.m. to 4:30 p.m. Monday - Friday. Please note that voicemails left after 4:00 p.m. may not be returned until the following business day.  We are closed weekends and major holidays. You have access to a nurse at all times for urgent questions. Please call the main number to the clinic [phone] and follow the prompts.  For any non-urgent questions, you may also contact your provider using MyChart. We now offer e-Visits for anyone 48 and older to request care online for non-urgent symptoms. For details visit [email or web address].   Also download the MyChart app! Go to the app store, search "MyChart", open the app, select [facility], and log in with your MyChart username and password.  Masks are optional in the cancer centers. If you would like for your care team to wear a mask while they are taking care of you, please let them know. You may have one support person who is at least 63 years old accompany you for your appointments.

## 2022-06-02 NOTE — Progress Notes (Signed)
Patient presents today for Taxol/Carboplatin infusions per providers order.  Vital signs within parameters for treatment.  Labs pending.  Patient has no new complaints at this time.  Taxol and Carboplatin given today per MD orders.  Tolerated infusion without adverse affects.  Vital signs stable.  No complaints at this time.  Discharge from clinic ambulatory in stable condition.  Alert and oriented X 3.  Follow up with [facility] as scheduled.

## 2022-06-03 ENCOUNTER — Ambulatory Visit
Admission: RE | Admit: 2022-06-03 | Discharge: 2022-06-03 | Disposition: A | Discharge status: Home / Self Care | Payer: No Typology Code available for payment source | Source: Ambulatory Visit | Attending: Radiation Oncology | Admitting: Radiation Oncology

## 2022-06-03 ENCOUNTER — Other Ambulatory Visit: Payer: Self-pay

## 2022-06-03 ENCOUNTER — Ambulatory Visit (INDEPENDENT_AMBULATORY_CARE_PROVIDER_SITE_OTHER): Payer: No Typology Code available for payment source | Admitting: Thoracic Surgery (Cardiothoracic Vascular Surgery)

## 2022-06-03 DIAGNOSIS — C160 Malignant neoplasm of cardia: Secondary | ICD-10-CM

## 2022-06-03 DIAGNOSIS — Z5111 Encounter for antineoplastic chemotherapy: Secondary | ICD-10-CM | POA: Diagnosis not present

## 2022-06-03 LAB — RAD ONC ARIA SESSION SUMMARY
Course Elapsed Days: 22
Plan Fractions Treated to Date: 16
Plan Prescribed Dose Per Fraction: 1.8 Gy
Plan Total Fractions Prescribed: 25
Plan Total Prescribed Dose: 45 Gy
Reference Point Dosage Given to Date: 28.8 Gy
Reference Point Session Dosage Given: 1.8 Gy
Session Number: 16

## 2022-06-03 NOTE — Progress Notes (Signed)
     [name] Provider: Office Consent for Telemedicine visit obtained.  Today's visit was completed via a real-time telehealth (see specific modality noted below). The patient/authorized person provided oral consent at the time of the visit to engage in a telemedicine encounter with the present provider at [facility]. The patient/authorized person was informed of the potential benefits, limitations, and risks of telemedicine. The patient/authorized person expressed understanding that the laws that protect confidentiality also apply to telemedicine. The patient/authorized person acknowledged understanding that telemedicine does not provide emergency services and that he or she would need to call 911 or proceed to the nearest hospital for help if such a need arose.   Total time spent in the clinical discussion 10 minutes.  Telehealth Modality: Phone visit (audio only)  I had a telephone visit with [name] [name].  He has some dysphagia, and was prescribed sucrafate.  He weight is 195lbs which is down 20lbs since starting therapy.   Hx: GE junction poorly differentiated cancer.  Radiation completion date: 06/22/2022 Operative Window: Late October, early Nov

## 2022-06-06 ENCOUNTER — Other Ambulatory Visit: Payer: Self-pay

## 2022-06-06 ENCOUNTER — Ambulatory Visit
Admission: RE | Admit: 2022-06-06 | Discharge: 2022-06-06 | Disposition: A | Discharge status: Home / Self Care | Payer: No Typology Code available for payment source | Source: Ambulatory Visit | Attending: Radiation Oncology | Admitting: Radiation Oncology

## 2022-06-06 ENCOUNTER — Inpatient Hospital Stay: Payer: No Typology Code available for payment source

## 2022-06-06 ENCOUNTER — Other Ambulatory Visit: Payer: Self-pay | Admitting: *Deleted

## 2022-06-06 VITALS — BP 114/73 | HR 70 | Temp 98.2°F | Resp 16

## 2022-06-06 DIAGNOSIS — Z95828 Presence of other vascular implants and grafts: Secondary | ICD-10-CM

## 2022-06-06 DIAGNOSIS — Z5111 Encounter for antineoplastic chemotherapy: Secondary | ICD-10-CM | POA: Diagnosis not present

## 2022-06-06 LAB — RAD ONC ARIA SESSION SUMMARY
Course Elapsed Days: 25
Plan Fractions Treated to Date: 17
Plan Prescribed Dose Per Fraction: 1.8 Gy
Plan Total Fractions Prescribed: 25
Plan Total Prescribed Dose: 45 Gy
Reference Point Dosage Given to Date: 30.6 Gy
Reference Point Session Dosage Given: 1.8 Gy
Session Number: 17

## 2022-06-06 MED ORDER — SODIUM CHLORIDE 0.9 % IV SOLN: INTRAVENOUS | Status: DC

## 2022-06-06 MED ORDER — SODIUM CHLORIDE 0.9% FLUSH
10.0000 mL | Freq: Once | INTRAVENOUS | Status: AC
  Administered 2022-06-06: 10 mL via INTRAVENOUS

## 2022-06-06 MED ORDER — HEPARIN SOD (PORK) LOCK FLUSH 100 UNIT/ML IV SOLN
500.0000 [IU] | Freq: Once | INTRAVENOUS | Status: AC
  Administered 2022-06-06: 500 [IU] via INTRAVENOUS

## 2022-06-06 NOTE — Progress Notes (Signed)
Order for 1 liter of NS over 2 hours placed per Dr. Tomie China request.

## 2022-06-07 ENCOUNTER — Other Ambulatory Visit: Payer: Self-pay | Admitting: Oncology

## 2022-06-07 ENCOUNTER — Ambulatory Visit
Admission: RE | Admit: 2022-06-07 | Discharge: 2022-06-07 | Disposition: A | Discharge status: Home / Self Care | Payer: No Typology Code available for payment source | Source: Ambulatory Visit | Attending: Radiation Oncology | Admitting: Radiation Oncology

## 2022-06-07 ENCOUNTER — Ambulatory Visit: Payer: No Typology Code available for payment source

## 2022-06-07 ENCOUNTER — Other Ambulatory Visit: Payer: Self-pay

## 2022-06-07 DIAGNOSIS — Z5111 Encounter for antineoplastic chemotherapy: Secondary | ICD-10-CM | POA: Diagnosis not present

## 2022-06-07 DIAGNOSIS — C160 Malignant neoplasm of cardia: Secondary | ICD-10-CM

## 2022-06-07 LAB — RAD ONC ARIA SESSION SUMMARY
Course Elapsed Days: 26
Plan Fractions Treated to Date: 18
Plan Prescribed Dose Per Fraction: 1.8 Gy
Plan Total Fractions Prescribed: 25
Plan Total Prescribed Dose: 45 Gy
Reference Point Dosage Given to Date: 32.4 Gy
Reference Point Session Dosage Given: 1.8 Gy
Session Number: 18

## 2022-06-08 ENCOUNTER — Other Ambulatory Visit: Payer: Self-pay

## 2022-06-08 ENCOUNTER — Ambulatory Visit
Admission: RE | Admit: 2022-06-08 | Discharge: 2022-06-08 | Disposition: A | Discharge status: Home / Self Care | Payer: No Typology Code available for payment source | Source: Ambulatory Visit | Attending: Radiation Oncology | Admitting: Radiation Oncology

## 2022-06-08 DIAGNOSIS — Z5111 Encounter for antineoplastic chemotherapy: Secondary | ICD-10-CM | POA: Diagnosis not present

## 2022-06-08 LAB — RAD ONC ARIA SESSION SUMMARY
Course Elapsed Days: 27
Plan Fractions Treated to Date: 19
Plan Prescribed Dose Per Fraction: 1.8 Gy
Plan Total Fractions Prescribed: 25
Plan Total Prescribed Dose: 45 Gy
Reference Point Dosage Given to Date: 34.2 Gy
Reference Point Session Dosage Given: 1.8 Gy
Session Number: 19

## 2022-06-09 ENCOUNTER — Inpatient Hospital Stay: Payer: No Typology Code available for payment source

## 2022-06-09 ENCOUNTER — Inpatient Hospital Stay (HOSPITAL_BASED_OUTPATIENT_CLINIC_OR_DEPARTMENT_OTHER): Payer: No Typology Code available for payment source | Admitting: Hematology

## 2022-06-09 ENCOUNTER — Other Ambulatory Visit: Payer: Self-pay

## 2022-06-09 ENCOUNTER — Ambulatory Visit
Admission: RE | Admit: 2022-06-09 | Discharge: 2022-06-09 | Disposition: A | Discharge status: Home / Self Care | Payer: No Typology Code available for payment source | Source: Ambulatory Visit | Attending: Radiation Oncology | Admitting: Radiation Oncology

## 2022-06-09 VITALS — BP 128/85 | HR 76 | Temp 97.0°F | Resp 18

## 2022-06-09 VITALS — BP 91/71 | HR 101 | Temp 97.7°F | Resp 18 | Ht 70.0 in | Wt 192.9 lb

## 2022-06-09 DIAGNOSIS — C160 Malignant neoplasm of cardia: Secondary | ICD-10-CM

## 2022-06-09 DIAGNOSIS — Z95828 Presence of other vascular implants and grafts: Secondary | ICD-10-CM

## 2022-06-09 DIAGNOSIS — Z5111 Encounter for antineoplastic chemotherapy: Secondary | ICD-10-CM | POA: Diagnosis not present

## 2022-06-09 LAB — RAD ONC ARIA SESSION SUMMARY
Course Elapsed Days: 28
Plan Fractions Treated to Date: 20
Plan Prescribed Dose Per Fraction: 1.8 Gy
Plan Total Fractions Prescribed: 25
Plan Total Prescribed Dose: 45 Gy
Reference Point Dosage Given to Date: 36 Gy
Reference Point Session Dosage Given: 1.8 Gy
Session Number: 20

## 2022-06-09 LAB — COMPREHENSIVE METABOLIC PANEL
ALT: 14 U/L (ref 0–44)
AST: 18 U/L (ref 15–41)
Albumin: 3.9 g/dL (ref 3.5–5.0)
Alkaline Phosphatase: 47 U/L (ref 38–126)
Anion gap: 7 (ref 5–15)
BUN: 15 mg/dL (ref 8–23)
CO2: 26 mmol/L (ref 22–32)
Calcium: 9.7 mg/dL (ref 8.9–10.3)
Chloride: 104 mmol/L (ref 98–111)
Creatinine, Ser: 0.93 mg/dL (ref 0.61–1.24)
GFR, Estimated: >60 mL/min (ref >60)
Glucose, Bld: 113 mg/dL — ABNORMAL HIGH (ref 70–99)
Potassium: 4.2 mmol/L (ref 3.5–5.1)
Sodium: 137 mmol/L (ref 135–145)
Total Bilirubin: 1.8 mg/dL — ABNORMAL HIGH (ref 0.3–1.2)
Total Protein: 6.6 g/dL (ref 6.5–8.1)

## 2022-06-09 LAB — CBC WITH DIFFERENTIAL/PLATELET
Abs Immature Granulocytes: 0.01 10*3/uL (ref 0.00–0.07)
Basophils Absolute: 0 10*3/uL (ref 0.0–0.1)
Basophils Relative: 0 %
Eosinophils Absolute: 0 10*3/uL (ref 0.0–0.5)
Eosinophils Relative: 0 %
HCT: 32.2 % — ABNORMAL LOW (ref 39.0–52.0)
Hemoglobin: 11.2 g/dL — ABNORMAL LOW (ref 13.0–17.0)
Immature Granulocytes: 0 %
Lymphocytes Relative: 8 %
Lymphs Abs: 0.2 10*3/uL — ABNORMAL LOW (ref 0.7–4.0)
MCH: 31 pg (ref 26.0–34.0)
MCHC: 34.8 g/dL (ref 30.0–36.0)
MCV: 89.2 fL (ref 80.0–100.0)
Monocytes Absolute: 0.2 10*3/uL (ref 0.1–1.0)
Monocytes Relative: 8 %
Neutro Abs: 2.4 10*3/uL (ref 1.7–7.7)
Neutrophils Relative %: 84 %
Platelets: 90 10*3/uL — ABNORMAL LOW (ref 150–400)
RBC: 3.61 MIL/uL — ABNORMAL LOW (ref 4.22–5.81)
RDW: 14.5 % (ref 11.5–15.5)
WBC: 2.9 10*3/uL — ABNORMAL LOW (ref 4.0–10.5)
nRBC: 0 % (ref 0.0–0.2)

## 2022-06-09 LAB — MAGNESIUM: Magnesium: 1.9 mg/dL (ref 1.7–2.4)

## 2022-06-09 MED ORDER — SODIUM CHLORIDE 0.9 % IV SOLN
50.0000 mg/m2 | Freq: Once | INTRAVENOUS | Status: AC
  Administered 2022-06-09: 108 mg via INTRAVENOUS
  Filled 2022-06-09: qty 18

## 2022-06-09 MED ORDER — DIPHENHYDRAMINE HCL 50 MG/ML IJ SOLN
50.0000 mg | Freq: Once | INTRAMUSCULAR | Status: AC
  Administered 2022-06-09: 50 mg via INTRAVENOUS
  Filled 2022-06-09: qty 1

## 2022-06-09 MED ORDER — SODIUM CHLORIDE 0.9% FLUSH
10.0000 mL | INTRAVENOUS | Status: DC | PRN
  Administered 2022-06-09: 10 mL

## 2022-06-09 MED ORDER — SODIUM CHLORIDE 0.9 % IV SOLN
270.8000 mg | Freq: Once | INTRAVENOUS | Status: AC
  Administered 2022-06-09: 270 mg via INTRAVENOUS
  Filled 2022-06-09: qty 27

## 2022-06-09 MED ORDER — FAMOTIDINE IN NACL 20-0.9 MG/50ML-% IV SOLN
20.0000 mg | Freq: Once | INTRAVENOUS | Status: AC
  Administered 2022-06-09: 20 mg via INTRAVENOUS
  Filled 2022-06-09: qty 50

## 2022-06-09 MED ORDER — SODIUM CHLORIDE 0.9 % IV SOLN: Freq: Once | INTRAVENOUS | Status: AC

## 2022-06-09 MED ORDER — SODIUM CHLORIDE 0.9 % IV SOLN: INTRAVENOUS | Status: AC

## 2022-06-09 MED ORDER — SODIUM CHLORIDE 0.9 % IV SOLN
10.0000 mg | Freq: Once | INTRAVENOUS | Status: AC
  Administered 2022-06-09: 10 mg via INTRAVENOUS
  Filled 2022-06-09: qty 10

## 2022-06-09 MED ORDER — PALONOSETRON HCL INJECTION 0.25 MG/5ML
0.2500 mg | Freq: Once | INTRAVENOUS | Status: AC
  Administered 2022-06-09: 0.25 mg via INTRAVENOUS
  Filled 2022-06-09: qty 5

## 2022-06-09 MED ORDER — HEPARIN SOD (PORK) LOCK FLUSH 100 UNIT/ML IV SOLN
500.0000 [IU] | Freq: Once | INTRAVENOUS | Status: AC | PRN
  Administered 2022-06-09: 500 [IU]

## 2022-06-09 NOTE — Patient Instructions (Signed)
[facility] at [facility] Discharge Instructions   You were seen and examined today by Dr. Katragadda.  He reviewed the results of your lab work which are normal/stable.  We will proceed with your treatment today.  Return as scheduled next week.    Thank you for choosing [facility] at [facility] to provide your oncology and hematology care.  To afford each patient quality time with our provider, please arrive at least 15 minutes before your scheduled appointment time.   If you have a lab appointment with the Cancer Center please come in thru the Main Entrance and check in at the main information desk.  You need to re-schedule your appointment should you arrive 10 or more minutes late.  We strive to give you quality time with our providers, and arriving late affects you and other patients whose appointments are after yours.  Also, if you no show three or more times for appointments you may be dismissed from the clinic at the providers discretion.     Again, thank you for choosing [facility].  Our hope is that these requests will decrease the amount of time that you wait before being seen by our physicians.       _____________________________________________________________  Should you have questions after your visit to [facility], please contact our office at [phone] and follow the prompts.  Our office hours are 8:00 a.m. and 4:30 p.m. Monday - Friday.  Please note that voicemails left after 4:00 p.m. may not be returned until the following business day.  We are closed weekends and major holidays.  You do have access to a nurse 24-7, just call the main number to the clinic [phone] and do not press any options, hold on the line and a nurse will answer the phone.    For prescription refill requests, have your pharmacy contact our office and allow 72 hours.    Due to Covid, you will need to wear a mask upon  entering the hospital. If you do not have a mask, a mask will be given to you at the Main Entrance upon arrival. For doctor visits, patients may have 1 support person age 18 or older with them. For treatment visits, patients can not have anyone with them due to social distancing guidelines and our immunocompromised population.      

## 2022-06-09 NOTE — Patient Instructions (Signed)
[facility]  Discharge Instructions: Thank you for choosing [facility] to provide your oncology and hematology care.  If you have a lab appointment with the [facility], please come in thru the Main Entrance and check in at the main information desk.  Wear comfortable clothing and clothing appropriate for easy access to any Portacath or PICC line.   We strive to give you quality time with your provider. You may need to reschedule your appointment if you arrive late (15 or more minutes).  Arriving late affects you and other patients whose appointments are after yours.  Also, if you miss three or more appointments without notifying the office, you may be dismissed from the clinic at the provider's discretion.      For prescription refill requests, have your pharmacy contact our office and allow 72 hours for refills to be completed.    Today you received the following chemotherapy and/or immunotherapy agents Taxol/Carboplatin      To help prevent nausea and vomiting after your treatment, we encourage you to take your nausea medication as directed.  BELOW ARE SYMPTOMS THAT SHOULD BE REPORTED IMMEDIATELY: *FEVER GREATER THAN 100.4 F (38 C) OR HIGHER *CHILLS OR SWEATING *NAUSEA AND VOMITING THAT IS NOT CONTROLLED WITH YOUR NAUSEA MEDICATION *UNUSUAL SHORTNESS OF BREATH *UNUSUAL BRUISING OR BLEEDING *URINARY PROBLEMS (pain or burning when urinating, or frequent urination) *BOWEL PROBLEMS (unusual diarrhea, constipation, pain near the anus) TENDERNESS IN MOUTH AND THROAT WITH OR WITHOUT PRESENCE OF ULCERS (sore throat, sores in mouth, or a toothache) UNUSUAL RASH, SWELLING OR PAIN  UNUSUAL VAGINAL DISCHARGE OR ITCHING   Items with * indicate a potential emergency and should be followed up as soon as possible or go to the Emergency Department if any problems should occur.  Please show the CHEMOTHERAPY ALERT CARD or IMMUNOTHERAPY ALERT CARD at check-in to the  Emergency Department and triage nurse.  Should you have questions after your visit or need to cancel or reschedule your appointment, please contact [facility] [phone]  and follow the prompts.  Office hours are 8:00 a.m. to 4:30 p.m. Monday - Friday. Please note that voicemails left after 4:00 p.m. may not be returned until the following business day.  We are closed weekends and major holidays. You have access to a nurse at all times for urgent questions. Please call the main number to the clinic [phone] and follow the prompts.  For any non-urgent questions, you may also contact your provider using MyChart. We now offer e-Visits for anyone 32 and older to request care online for non-urgent symptoms. For details visit [email or web address].   Also download the MyChart app! Go to the app store, search "MyChart", open the app, select [facility], and log in with your MyChart username and password.  Masks are optional in the cancer centers. If you would like for your care team to wear a mask while they are taking care of you, please let them know. You may have one support person who is at least 63 years old accompany you for your appointments.

## 2022-06-09 NOTE — Progress Notes (Signed)
Patient has been examined by Dr. Delton Coombes, and vital signs and labs have been reviewed. ANC, Creatinine, LFTs (total bili 1.8), hemoglobin, and platelets (90) are within treatment parameters per M.D. - pt may proceed with treatment.  Primary RN and pharmacy notified.

## 2022-06-09 NOTE — Progress Notes (Signed)
Patient presents today for Taxol and Carboplatin per providers order.  Vital signs within parameters for treatment.  Labs reviewed and platelets noted to be 90 and total bilirubin 1.8, MD aware.  Message received from Anastasio Champion RN/Dr. Delton Coombes patient okay for treatment.  Patient will also receive on liter of saline over one hour.  Treatment given today per MD orders.  Stable during infusion without adverse affects.  Vital signs stable.  No complaints at this time.  Discharge from clinic ambulatory in stable condition.  Alert and oriented X 3.  Follow up with [facility] as scheduled.

## 2022-06-09 NOTE — Progress Notes (Signed)
[name] [name], [place] [address]   CLINIC:  Medical Oncology/Hematology  PCP:  Derek Jack, 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  PRIOR THERAPY: none  NGS Results: not done  CURRENT THERAPY: Chemoradiation therapy with weekly carboplatin and paclitaxel  BRIEF ONCOLOGIC HISTORY:  Oncology History  GE junction carcinoma ([facility])  04/12/2022 Initial Diagnosis   GE junction carcinoma ([facility])   05/12/2022 -  Chemotherapy   Patient is on Treatment Plan : ESOPHAGUS Carboplatin/PACLitaxel weekly x 6 weeks with XRT         CANCER STAGING:  Cancer Staging  GE junction carcinoma ([facility]) Staging form: Esophagus - Adenocarcinoma, AJCC 8th Edition - Clinical stage from 04/12/2022: Stage III (cT3, cN0, cM0, G3) - Unsigned   INTERVAL HISTORY:  [name] [name], a 61 y.o. male, seen for follow-up of GE junction adenocarcinoma.  He is here for week 5 of treatment.  He has lost about 3 pounds since last week.  He reports altered taste.  Energy levels are down to about 50%.  REVIEW OF SYSTEMS:  Review of Systems  Constitutional:  Positive for unexpected weight change (-3 pounds). Negative for appetite change and fatigue.  HENT:   Negative for trouble swallowing.   Respiratory:  Positive for cough.   Gastrointestinal:  Positive for nausea (resolved).  Neurological:  Positive for dizziness.  All other systems reviewed and are negative.   PAST MEDICAL/SURGICAL HISTORY:  Past Medical History:  Diagnosis Date   Port-A-Cath in place 05/11/2022   Past Surgical History:  Procedure Laterality Date   BIOPSY  03/25/2022   Procedure: BIOPSY;  Surgeon: Harvel Quale, MD;  [facility];  Service: Gastroenterology;;  GE junction;   BIOPSY  04/25/2022   Procedure: BIOPSY;  Surgeon: Irving Copas., MD;  Location: [facility];   Service: Gastroenterology;;   COLONOSCOPY     COLONOSCOPY WITH PROPOFOL N/A 03/25/2022   Procedure: COLONOSCOPY WITH PROPOFOL;  Surgeon: Harvel Quale, MD;  Location: [facility];  Service: Gastroenterology;  Laterality: N/A;  945 ASA 1   ESOPHAGOGASTRODUODENOSCOPY (EGD) WITH PROPOFOL N/A 03/25/2022   Procedure: ESOPHAGOGASTRODUODENOSCOPY (EGD) WITH PROPOFOL;  Surgeon: Harvel Quale, MD;  Location: [facility];  Service: Gastroenterology;  Laterality: N/A;   ESOPHAGOGASTRODUODENOSCOPY (EGD) WITH PROPOFOL N/A 04/25/2022   Procedure: ESOPHAGOGASTRODUODENOSCOPY (EGD) WITH PROPOFOL;  Surgeon: Rush Landmark Telford Nab., MD;  Location: [facility];  Service: Gastroenterology;  Laterality: N/A;   IR IMAGING GUIDED PORT INSERTION  05/04/2022   UPPER ESOPHAGEAL ENDOSCOPIC ULTRASOUND (EUS) N/A 04/25/2022   Procedure: UPPER ESOPHAGEAL ENDOSCOPIC ULTRASOUND (EUS);  Surgeon: Irving Copas., MD;  Location: [facility];  Service: Gastroenterology;  Laterality: N/A;    SOCIAL HISTORY:  Social History   Socioeconomic History   Marital status: Married    Spouse name: Not on file   Number of children: Not on file   Years of education: Not on file   Highest education level: Not on file  Occupational History   Not on file  Tobacco Use   Smoking status: Never    Passive exposure: Past   Smokeless tobacco: Never  Vaping Use   Vaping Use: Never used  Substance and Sexual Activity   Alcohol use: Yes    Comment: 1 daily   Drug use: Never   Sexual activity: Yes  Other Topics Concern   Not on  file  Social History Narrative   Not on file   Social Determinants of Health   Financial Resource Strain: Not on file  Food Insecurity: Not on file  Transportation Needs: Not on file  Physical Activity: Not on file  Stress: Not on file  Social Connections: Not on file  Intimate Partner Violence: Not on file    FAMILY HISTORY:  Family History  Problem Relation Age of Onset    Cancer Maternal Grandmother        Breast cancer   Cancer Other        Maternal Cousin - breast cancer    CURRENT MEDICATIONS:  Current Outpatient Medications  Medication Sig Dispense Refill   CARBOPLATIN IV Inject into the vein once a week.     lidocaine (XYLOCAINE) 2 % solution Use as directed 15 mLs in the mouth or throat every 4 (four) hours as needed for mouth pain. 100 mL 5   lidocaine-prilocaine (EMLA) cream Apply a small amount to port a cath site and cover with plastic wrap 1 hour prior to infusion appointments (Patient not taking: Reported on 06/02/2022) 30 g 3   omeprazole (PRILOSEC) 40 MG capsule Take 40 mg by mouth 2 (two) times daily.     PACLitaxel (TAXOL IV) Inject into the vein once a week.     prochlorperazine (COMPAZINE) 10 MG tablet Take 1 tablet (10 mg total) by mouth every 6 (six) hours as needed (Nausea or vomiting). 60 tablet 3   sucralfate (CARAFATE) 1 g tablet Take 1 tablet (1 g total) by mouth 4 (four) times daily -  with meals and at bedtime. Crush and dissolve in 10 mL of warm water prior to swallowing, take 20 minutes before meals. 120 tablet 1   No current facility-administered medications for this visit.   Facility-Administered Medications Ordered in Other Visits  Medication Dose Route Frequency Provider Last Rate Last Admin   sodium chloride flush (NS) 0.9 % injection 10 mL  10 mL Intracatheter PRN Derek Jack, MD   10 mL at 06/09/22 1331    ALLERGIES:  No Known Allergies  PHYSICAL EXAM:  Performance status (ECOG): 0 - Asymptomatic  Vitals:   06/09/22 0849  BP: 91/71  Pulse: (!) 101  Resp: 18  Temp: 97.7 F (36.5 C)  SpO2: 100%   Wt Readings from Last 3 Encounters:  06/09/22 192 lb 14.4 oz (87.5 kg)  06/02/22 196 lb 4.8 oz (89 kg)  05/26/22 198 lb 9.6 oz (90.1 kg)   Physical Exam Vitals reviewed.  Constitutional:      Appearance: Normal appearance.  Cardiovascular:     Rate and Rhythm: Normal rate and regular rhythm.      Pulses: Normal pulses.     Heart sounds: Normal heart sounds.  Pulmonary:     Effort: Pulmonary effort is normal.     Breath sounds: Normal breath sounds.  Neurological:     General: No focal deficit present.     Mental Status: He is alert and oriented to person, place, and time.  Psychiatric:        Mood and Affect: Mood normal.        Behavior: Behavior normal.     LABORATORY DATA:  I have reviewed the labs as listed.     Latest Ref Rng & Units 06/09/2022    9:00 AM 06/02/2022    8:50 AM 05/26/2022    9:56 AM  CBC  WBC 4.0 - 10.5 K/uL 2.9  3.0  3.6  Hemoglobin 13.0 - 17.0 g/dL 11.2  11.3  12.9   Hematocrit 39.0 - 52.0 % 32.2  32.3  36.9   Platelets 150 - 400 K/uL 90  151  158       Latest Ref Rng & Units 06/09/2022    9:00 AM 06/02/2022    8:50 AM 05/26/2022    9:56 AM  CMP  Glucose 70 - 99 mg/dL 113  106  112   BUN 8 - 23 mg/dL '15  14  20   '$ Creatinine 0.61 - 1.24 mg/dL 0.93  0.79  0.84   Sodium 135 - 145 mmol/L 137  136  134   Potassium 3.5 - 5.1 mmol/L 4.2  4.0  4.3   Chloride 98 - 111 mmol/L 104  105  102   CO2 22 - 32 mmol/L '26  25  25   '$ Calcium 8.9 - 10.3 mg/dL 9.7  9.4  9.7   Total Protein 6.5 - 8.1 g/dL 6.6  6.5  7.1   Total Bilirubin 0.3 - 1.2 mg/dL 1.8  0.8  1.6   Alkaline Phos 38 - 126 U/L 47  46  56   AST 15 - 41 U/L '18  16  17   '$ ALT 0 - 44 U/L '14  12  15     '$ DIAGNOSTIC IMAGING:  I have independently reviewed the scans and discussed with the patient. No results found.   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  He lost about 60 pounds since September, was trying to lose weight by walking 7 miles a day. - EGD (03/25/2022): Large ulcerating mass with no bleeding found at the GE junction, 40-42 cm from incisors.  Mass appeared to extend 1 cm distal towards the cardia.  Partially obstructing and circumferential.  Normal stomach and  duodenum. 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 - CT CAP (03/30/2022): Thickened folds of the GE junction without discrete focal mass.  No upstream dilatation.  No intrathoracic, axillary or abdominal adenopathy.  5 mm right upper lobe solitary nodule.  2.4 cm densely calcified right thyroid nodule.  Mild hepatic steatosis. - PET CT scan (04/07/2022): Hypermetabolic area at the GE junction SUV 6.93.  No signs of adenopathy or suspicious lung nodules.  No sign of other metastatic disease.  Left thyroid lesion with moderately increased metabolic activity, SUV 1.61 - EGD/EUS (04/25/2022): Malignant esophageal tumor at distal esophagus extending into GE junction and into the cardia.  EUS staging T3N0.  No malignant appearing lymph nodes visualized.  Mass extends from 37 cm to 42 cm (esophageal portion is 37-40 cm).  Lesion was circumferential.  Sonographic evidence suggesting invasion into 1 through the muscularis propria.  Intact interface seen between the mass and adjacent structures. - Dr. Kipp Brood recommended surgery between 6 to 10 weeks after completion of chemoradiation with repeat PET scan in 4 to 6 weeks after last dose of radiation. - Concurrent chemoradiation with carboplatin and paclitaxel followed by surgery and possibly adjuvant nivolumab for 1 year (Checkmate 577) if no complete pathological response is achieved. - Concurrent chemoradiation therapy started on 05/12/2022.    Social/family history: - Lives at home with his wife.  He is a retired Electronics engineer at [employer].  Non-smoker.  Drinks alcohol 5 days/week, moderate. - Maternal grandmother had breast cancer.  Maternal cousin had metastatic breast cancer.  2.  Incidental  PET positive left thyroid nodule: - Biopsy of the right thyroid nodule on 04/24/382, scant follicular epithelium, Bethesda category 1. - He will need work-up for left thyroid nodule after completion of  treatments.   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  I have reviewed his labs which showed slightly elevated total bilirubin 1.8.  Rest of the LFTs are normal.  White count is 2.9 and ANC is normal.  Platelet count is 90.  We will proceed with week 5 of treatment today.  He will return to clinic next week, likely for his last treatment.  Last XRT is on 07/22/2022.  2.  Weight loss: - She has lost close to 20 pounds since start of therapy. - Continue sucralfate which is helping.  3.  Dizziness: - We will arrange him for 1 L normal saline on Mondays, Wednesday and Friday.   Orders placed this encounter:  No orders of the defined types were placed in this encounter.    Derek Jack, MD [facility] [phone]

## 2022-06-10 ENCOUNTER — Ambulatory Visit
Admission: RE | Admit: 2022-06-10 | Discharge: 2022-06-10 | Disposition: A | Discharge status: Home / Self Care | Payer: No Typology Code available for payment source | Source: Ambulatory Visit | Attending: Radiation Oncology | Admitting: Radiation Oncology

## 2022-06-10 ENCOUNTER — Ambulatory Visit: Payer: No Typology Code available for payment source

## 2022-06-10 ENCOUNTER — Inpatient Hospital Stay: Payer: No Typology Code available for payment source

## 2022-06-10 ENCOUNTER — Other Ambulatory Visit: Payer: Self-pay

## 2022-06-10 VITALS — BP 111/75 | HR 70 | Temp 97.7°F | Resp 17

## 2022-06-10 DIAGNOSIS — Z95828 Presence of other vascular implants and grafts: Secondary | ICD-10-CM

## 2022-06-10 DIAGNOSIS — C160 Malignant neoplasm of cardia: Secondary | ICD-10-CM

## 2022-06-10 DIAGNOSIS — Z5111 Encounter for antineoplastic chemotherapy: Secondary | ICD-10-CM | POA: Diagnosis not present

## 2022-06-10 DIAGNOSIS — R1319 Other dysphagia: Secondary | ICD-10-CM

## 2022-06-10 LAB — RAD ONC ARIA SESSION SUMMARY
Course Elapsed Days: 29
Plan Fractions Treated to Date: 21
Plan Prescribed Dose Per Fraction: 1.8 Gy
Plan Total Fractions Prescribed: 25
Plan Total Prescribed Dose: 45 Gy
Reference Point Dosage Given to Date: 37.8 Gy
Reference Point Session Dosage Given: 1.8 Gy
Session Number: 21

## 2022-06-10 MED ORDER — SODIUM CHLORIDE 0.9% FLUSH
10.0000 mL | INTRAVENOUS | Status: DC | PRN
  Administered 2022-06-10: 10 mL via INTRAVENOUS

## 2022-06-10 MED ORDER — SODIUM CHLORIDE 0.9 % IV SOLN: INTRAVENOUS | Status: DC

## 2022-06-10 MED ORDER — HEPARIN SOD (PORK) LOCK FLUSH 100 UNIT/ML IV SOLN
500.0000 [IU] | Freq: Once | INTRAVENOUS | Status: AC
  Administered 2022-06-10: 500 [IU] via INTRAVENOUS

## 2022-06-13 ENCOUNTER — Other Ambulatory Visit: Payer: Self-pay

## 2022-06-13 ENCOUNTER — Ambulatory Visit
Admission: RE | Admit: 2022-06-13 | Discharge: 2022-06-13 | Disposition: A | Discharge status: Home / Self Care | Payer: No Typology Code available for payment source | Source: Ambulatory Visit | Attending: Radiation Oncology | Admitting: Radiation Oncology

## 2022-06-13 ENCOUNTER — Ambulatory Visit: Payer: No Typology Code available for payment source

## 2022-06-13 ENCOUNTER — Inpatient Hospital Stay: Payer: No Typology Code available for payment source

## 2022-06-13 VITALS — BP 111/76 | HR 85 | Temp 97.7°F | Resp 17

## 2022-06-13 DIAGNOSIS — Z5111 Encounter for antineoplastic chemotherapy: Secondary | ICD-10-CM | POA: Diagnosis not present

## 2022-06-13 DIAGNOSIS — Z95828 Presence of other vascular implants and grafts: Secondary | ICD-10-CM

## 2022-06-13 DIAGNOSIS — C160 Malignant neoplasm of cardia: Secondary | ICD-10-CM

## 2022-06-13 LAB — RAD ONC ARIA SESSION SUMMARY
Course Elapsed Days: 32
Plan Fractions Treated to Date: 22
Plan Prescribed Dose Per Fraction: 1.8 Gy
Plan Total Fractions Prescribed: 25
Plan Total Prescribed Dose: 45 Gy
Reference Point Dosage Given to Date: 39.6 Gy
Reference Point Session Dosage Given: 1.8 Gy
Session Number: 22

## 2022-06-13 MED ORDER — SODIUM CHLORIDE 0.9 % IV SOLN: INTRAVENOUS | Status: AC

## 2022-06-13 MED ORDER — SODIUM CHLORIDE 0.9% FLUSH
10.0000 mL | Freq: Once | INTRAVENOUS | Status: AC
  Administered 2022-06-13: 10 mL via INTRAVENOUS

## 2022-06-13 MED ORDER — HEPARIN SOD (PORK) LOCK FLUSH 100 UNIT/ML IV SOLN
500.0000 [IU] | Freq: Once | INTRAVENOUS | Status: AC
  Administered 2022-06-13: 500 [IU] via INTRAVENOUS

## 2022-06-13 NOTE — Patient Instructions (Signed)
Rehydration, Adult Rehydration is the replacement of body fluids, salts, and minerals (electrolytes) that are lost during dehydration. Dehydration is when there is not enough water or other fluids in the body. This happens when you lose more fluids than you take in. Common causes of dehydration include: Not drinking enough fluids. This can occur when you are ill or doing activities that require a lot of energy, especially in hot weather. Conditions that cause loss of water or other fluids, such as diarrhea, vomiting, sweating, or urinating a lot. Other illnesses, such as fever or infection. Certain medicines, such as those that remove excess fluid from the body (diuretics). Symptoms of mild or moderate dehydration may include thirst, dry lips and mouth, and dizziness. Symptoms of severe dehydration may include increased heart rate, confusion, fainting, and not urinating. For severe dehydration, you may need to get fluids through an IV at the hospital. For mild or moderate dehydration, you can usually rehydrate at home by drinking certain fluids as told by your health care provider. What are the risks? Generally, rehydration is safe. However, taking in too much fluid (overhydration) can be a problem. This is rare. Overhydration can cause an electrolyte imbalance, kidney failure, or a decrease in salt (sodium) levels in the body. Supplies needed You will need an oral rehydration solution (ORS) if your health care provider tells you to use one. This is a drink to treat dehydration. It can be found in pharmacies and retail stores. How to rehydrate Fluids Follow instructions from your health care provider for rehydration. The kind of fluid and the amount you should drink depend on your condition. In general, you should choose drinks that you prefer. If told by your health care provider, drink an ORS. Make an ORS by following instructions on the package. Start by drinking small amounts, about  cup (120  mL) every 5-10 minutes. Slowly increase how much you drink until you have taken the amount recommended by your health care provider. Drink enough clear fluids to keep your urine pale yellow. If you were told to drink an ORS, finish it first, then start slowly drinking other clear fluids. Drink fluids such as: Water. This includes sparkling water and flavored water. Drinking only water can lead to having too little sodium in your body (hyponatremia). Follow the advice of your health care provider. Water from ice chips you suck on. Fruit juice with water you add to it (diluted). Sports drinks. Hot or cold herbal teas. Broth-based soups. Milk or milk products. Food Follow instructions from your health care provider about what to eat while you rehydrate. Your health care provider may recommend that you slowly begin eating regular foods in small amounts. Eat foods that contain a healthy balance of electrolytes, such as bananas, oranges, potatoes, tomatoes, and spinach. Avoid foods that are greasy or contain a lot of sugar. In some cases, you may get nutrition through a feeding tube that is passed through your nose and into your stomach (nasogastric tube, or NG tube). This may be done if you have uncontrolled vomiting or diarrhea. Beverages to avoid  Certain beverages may make dehydration worse. While you rehydrate, avoid drinking alcohol. How to tell if you are recovering from dehydration You may be recovering from dehydration if: You are urinating more often than before you started rehydrating. Your urine is pale yellow. Your energy level improves. You vomit less frequently. You have diarrhea less frequently. Your appetite improves or returns to normal. You feel less dizzy or less light-headed.   Your skin tone and color start to look more normal. Follow these instructions at home: Take over-the-counter and prescription medicines only as told by your health care provider. Do not take sodium  tablets. Doing this can lead to having too much sodium in your body (hypernatremia). Contact a health care provider if: You continue to have symptoms of mild or moderate dehydration, such as: Thirst. Dry lips. Slightly dry mouth. Dizziness. Dark urine or less urine than normal. Muscle cramps. You continue to vomit or have diarrhea. Get help right away if you: Have symptoms of dehydration that get worse. Have a fever. Have a severe headache. Have been vomiting and the following happens: Your vomiting gets worse or does not go away. Your vomit includes blood or green matter (bile). You cannot eat or drink without vomiting. Have problems with urination or bowel movements, such as: Diarrhea that gets worse or does not go away. Blood in your stool (feces). This may cause stool to look black and tarry. Not urinating, or urinating only a small amount of very dark urine, within 6-8 hours. Have trouble breathing. Have symptoms that get worse with treatment. These symptoms may represent a serious problem that is an emergency. Do not wait to see if the symptoms will go away. Get medical help right away. Call your local emergency services (911 in the [place] Do not drive yourself to the hospital. Summary Rehydration is the replacement of body fluids and minerals (electrolytes) that are lost during dehydration. Follow instructions from your health care provider for rehydration. The kind of fluid and amount you should drink depend on your condition. Slowly increase how much you drink until you have taken the amount recommended by your health care provider. Contact your health care provider if you continue to show signs of mild or moderate dehydration. This information is not intended to replace advice given to you by your health care provider. Make sure you discuss any questions you have with your health care provider. Document Revised: 12/04/2019 Document Reviewed: 10/14/2019 Elsevier Patient  Education  2023 [employer]  

## 2022-06-14 ENCOUNTER — Other Ambulatory Visit: Payer: Self-pay

## 2022-06-14 ENCOUNTER — Ambulatory Visit
Admission: RE | Admit: 2022-06-14 | Discharge: 2022-06-14 | Disposition: A | Discharge status: Home / Self Care | Payer: No Typology Code available for payment source | Source: Ambulatory Visit | Attending: Radiation Oncology | Admitting: Radiation Oncology

## 2022-06-14 ENCOUNTER — Ambulatory Visit: Payer: No Typology Code available for payment source

## 2022-06-14 ENCOUNTER — Other Ambulatory Visit: Payer: Self-pay | Admitting: Oncology

## 2022-06-14 DIAGNOSIS — Z5111 Encounter for antineoplastic chemotherapy: Secondary | ICD-10-CM | POA: Diagnosis not present

## 2022-06-14 DIAGNOSIS — R1319 Other dysphagia: Secondary | ICD-10-CM

## 2022-06-14 DIAGNOSIS — C160 Malignant neoplasm of cardia: Secondary | ICD-10-CM

## 2022-06-14 LAB — RAD ONC ARIA SESSION SUMMARY
Course Elapsed Days: 33
Plan Fractions Treated to Date: 23
Plan Prescribed Dose Per Fraction: 1.8 Gy
Plan Total Fractions Prescribed: 25
Plan Total Prescribed Dose: 45 Gy
Reference Point Dosage Given to Date: 41.4 Gy
Reference Point Session Dosage Given: 1.8 Gy
Session Number: 23

## 2022-06-15 ENCOUNTER — Ambulatory Visit
Admission: RE | Admit: 2022-06-15 | Discharge: 2022-06-15 | Disposition: A | Discharge status: Home / Self Care | Payer: No Typology Code available for payment source | Source: Ambulatory Visit | Attending: Radiation Oncology | Admitting: Radiation Oncology

## 2022-06-15 ENCOUNTER — Inpatient Hospital Stay: Payer: No Typology Code available for payment source

## 2022-06-15 ENCOUNTER — Other Ambulatory Visit: Payer: Self-pay

## 2022-06-15 DIAGNOSIS — C160 Malignant neoplasm of cardia: Secondary | ICD-10-CM

## 2022-06-15 DIAGNOSIS — R1319 Other dysphagia: Secondary | ICD-10-CM

## 2022-06-15 DIAGNOSIS — Z5111 Encounter for antineoplastic chemotherapy: Secondary | ICD-10-CM | POA: Diagnosis not present

## 2022-06-15 LAB — RAD ONC ARIA SESSION SUMMARY
Course Elapsed Days: 34
Plan Fractions Treated to Date: 24
Plan Prescribed Dose Per Fraction: 1.8 Gy
Plan Total Fractions Prescribed: 25
Plan Total Prescribed Dose: 45 Gy
Reference Point Dosage Given to Date: 43.2 Gy
Reference Point Session Dosage Given: 1.8 Gy
Session Number: 24

## 2022-06-15 MED ORDER — SODIUM CHLORIDE 0.9 % IV SOLN: INTRAVENOUS | Status: DC

## 2022-06-15 NOTE — Patient Instructions (Signed)
Dehydration, Adult Dehydration is condition in which there is not enough water or other fluids in the body. This happens when a person loses more fluids than he or she takes in. Important body parts cannot work right without the right amount of fluids. Any loss of fluids from the body can cause dehydration. Dehydration can be mild, worse, or very bad. It should be treated right away to keep it from getting very bad. What are the causes? This condition may be caused by: Conditions that cause loss of water or other fluids, such as: Watery poop (diarrhea). Vomiting. Sweating a lot. Peeing (urinating) a lot. Not drinking enough fluids, especially when you: Are ill. Are doing things that take a lot of energy to do. Other illnesses and conditions, such as fever or infection. Certain medicines, such as medicines that take extra fluid out of the body (diuretics). Lack of safe drinking water. Not being able to get enough water and food. What increases the risk? The following factors may make you more likely to develop this condition: Having a long-term (chronic) illness that has not been treated the right way, such as: Diabetes. Heart disease. Kidney disease. Being 65 years of age or older. Having a disability. Living in a place that is high above the ground or sea (high in altitude). The thinner, dried air causes more fluid loss. Doing exercises that put stress on your body for a long time. What are the signs or symptoms? Symptoms of dehydration depend on how bad it is. Mild or worse dehydration Thirst. Dry lips or dry mouth. Feeling dizzy or light-headed, especially when you stand up from sitting. Muscle cramps. Your body making: Dark pee (urine). Pee may be the color of tea. Less pee than normal. Less tears than normal. Headache. Very bad dehydration Changes in skin. Skin may: Be cold to the touch (clammy). Be blotchy or pale. Not go back to normal right after you lightly pinch  it and let it go. Little or no tears, pee, or sweat. Changes in vital signs, such as: Fast breathing. Low blood pressure. Weak pulse. Pulse that is more than 100 beats a minute when you are sitting still. Other changes, such as: Feeling very thirsty. Eyes that look hollow (sunken). Cold hands and feet. Being mixed up (confused). Being very tired (lethargic) or having trouble waking from sleep. Short-term weight loss. Loss of consciousness. How is this treated? Treatment for this condition depends on how bad it is. Treatment should start right away. Do not wait until your condition gets very bad. Very bad dehydration is an emergency. You will need to go to a hospital. Mild or worse dehydration can be treated at home. You may be asked to: Drink more fluids. Drink an oral rehydration solution (ORS). This drink helps get the right amounts of fluids and salts and minerals in the blood (electrolytes). Very bad dehydration can be treated: With fluids through an IV tube. By getting normal levels of salts and minerals in your blood. This is often done by giving salts and minerals through a tube. The tube is passed through your nose and into your stomach. By treating the root cause. Follow these instructions at home: Oral rehydration solution If told by your doctor, drink an ORS: Make an ORS. Use instructions on the package. Start by drinking small amounts, about  cup (120 mL) every 5-10 minutes. Slowly drink more until you have had the amount that your doctor said to have. Eating and drinking          Drink enough clear fluid to keep your pee pale yellow. If you were told to drink an ORS, finish the ORS first. Then, start slowly drinking other clear fluids. Drink fluids such as: Water. Do not drink only water. Doing that can make the salt (sodium) level in your body get too low. Water from ice chips you suck on. Fruit juice that you have added water to (diluted). Low-calorie sports  drinks. Eat foods that have the right amounts of salts and minerals, such as: Bananas. Oranges. Potatoes. Tomatoes. Spinach. Do not drink alcohol. Avoid: Drinks that have a lot of sugar. These include: High-calorie sports drinks. Fruit juice that you did not add water to. Soda. Caffeine. Foods that are greasy or have a lot of fat or sugar. General instructions Take over-the-counter and prescription medicines only as told by your doctor. Do not take salt tablets. Doing that can make the salt level in your body get too high. Return to your normal activities as told by your doctor. Ask your doctor what activities are safe for you. Keep all follow-up visits as told by your doctor. This is important. Contact a doctor if: You have pain in your belly (abdomen) and the pain: Gets worse. Stays in one place. You have a rash. You have a stiff neck. You get angry or annoyed (irritable) more easily than normal. You are more tired or have a harder time waking than normal. You feel: Weak or dizzy. Very thirsty. Get help right away if you have: Any symptoms of very bad dehydration. Symptoms of vomiting, such as: You cannot eat or drink without vomiting. Your vomiting gets worse or does not go away. Your vomit has blood or green stuff in it. Symptoms that get worse with treatment. A fever. A very bad headache. Problems with peeing or pooping (having a bowel movement), such as: Watery poop that gets worse or does not go away. Blood in your poop (stool). This may cause poop to look black and tarry. Not peeing in 6-8 hours. Peeing only a small amount of very dark pee in 6-8 hours. Trouble breathing. These symptoms may be an emergency. Do not wait to see if the symptoms will go away. Get medical help right away. Call your local emergency services (911 in the [place] Do not drive yourself to the hospital. Summary Dehydration is a condition in which there is not enough water or other fluids  in the body. This happens when a person loses more fluids than he or she takes in. Treatment for this condition depends on how bad it is. Treatment should be started right away. Do not wait until your condition gets very bad. Drink enough clear fluid to keep your pee pale yellow. If you were told to drink an oral rehydration solution (ORS), finish the ORS first. Then, start slowly drinking other clear fluids. Take over-the-counter and prescription medicines only as told by your doctor. Get help right away if you have any symptoms of very bad dehydration. This information is not intended to replace advice given to you by your health care provider. Make sure you discuss any questions you have with your health care provider. Document Revised: 02/09/2022 Document Reviewed: 05/16/2019 Elsevier Patient Education  2023 Elsevier Inc.  

## 2022-06-16 ENCOUNTER — Inpatient Hospital Stay: Payer: No Typology Code available for payment source | Attending: Hematology

## 2022-06-16 ENCOUNTER — Inpatient Hospital Stay: Payer: No Typology Code available for payment source | Admitting: Dietician

## 2022-06-16 ENCOUNTER — Other Ambulatory Visit: Payer: Self-pay

## 2022-06-16 ENCOUNTER — Ambulatory Visit
Admission: RE | Admit: 2022-06-16 | Discharge: 2022-06-16 | Disposition: A | Discharge status: Home / Self Care | Payer: No Typology Code available for payment source | Source: Ambulatory Visit | Attending: Radiation Oncology | Admitting: Radiation Oncology

## 2022-06-16 ENCOUNTER — Inpatient Hospital Stay (HOSPITAL_BASED_OUTPATIENT_CLINIC_OR_DEPARTMENT_OTHER): Payer: No Typology Code available for payment source | Admitting: Hematology

## 2022-06-16 ENCOUNTER — Inpatient Hospital Stay: Payer: No Typology Code available for payment source

## 2022-06-16 VITALS — BP 104/67 | HR 99 | Temp 97.3°F | Resp 18 | Wt 193.5 lb

## 2022-06-16 DIAGNOSIS — R634 Abnormal weight loss: Secondary | ICD-10-CM | POA: Diagnosis not present

## 2022-06-16 DIAGNOSIS — Z803 Family history of malignant neoplasm of breast: Secondary | ICD-10-CM | POA: Diagnosis not present

## 2022-06-16 DIAGNOSIS — M21372 Foot drop, left foot: Secondary | ICD-10-CM | POA: Insufficient documentation

## 2022-06-16 DIAGNOSIS — C160 Malignant neoplasm of cardia: Secondary | ICD-10-CM

## 2022-06-16 DIAGNOSIS — R42 Dizziness and giddiness: Secondary | ICD-10-CM | POA: Insufficient documentation

## 2022-06-16 DIAGNOSIS — E041 Nontoxic single thyroid nodule: Secondary | ICD-10-CM | POA: Insufficient documentation

## 2022-06-16 LAB — CBC WITH DIFFERENTIAL/PLATELET
Abs Immature Granulocytes: 0.02 10*3/uL (ref 0.00–0.07)
Basophils Absolute: 0 10*3/uL (ref 0.0–0.1)
Basophils Relative: 1 %
Eosinophils Absolute: 0 10*3/uL (ref 0.0–0.5)
Eosinophils Relative: 1 %
HCT: 25.4 % — ABNORMAL LOW (ref 39.0–52.0)
Hemoglobin: 8.9 g/dL — ABNORMAL LOW (ref 13.0–17.0)
Immature Granulocytes: 1 %
Lymphocytes Relative: 5 %
Lymphs Abs: 0.1 10*3/uL — ABNORMAL LOW (ref 0.7–4.0)
MCH: 31.8 pg (ref 26.0–34.0)
MCHC: 35 g/dL (ref 30.0–36.0)
MCV: 90.7 fL (ref 80.0–100.0)
Monocytes Absolute: 0.1 10*3/uL (ref 0.1–1.0)
Monocytes Relative: 5 %
Neutro Abs: 1.5 10*3/uL — ABNORMAL LOW (ref 1.7–7.7)
Neutrophils Relative %: 87 %
Platelets: 50 10*3/uL — ABNORMAL LOW (ref 150–400)
RBC: 2.8 MIL/uL — ABNORMAL LOW (ref 4.22–5.81)
RDW: 14.8 % (ref 11.5–15.5)
WBC: 1.7 10*3/uL — ABNORMAL LOW (ref 4.0–10.5)
nRBC: 0 % (ref 0.0–0.2)

## 2022-06-16 LAB — COMPREHENSIVE METABOLIC PANEL
ALT: 14 U/L (ref 0–44)
AST: 19 U/L (ref 15–41)
Albumin: 3.6 g/dL (ref 3.5–5.0)
Alkaline Phosphatase: 44 U/L (ref 38–126)
Anion gap: 4 — ABNORMAL LOW (ref 5–15)
BUN: 14 mg/dL (ref 8–23)
CO2: 27 mmol/L (ref 22–32)
Calcium: 9.5 mg/dL (ref 8.9–10.3)
Chloride: 107 mmol/L (ref 98–111)
Creatinine, Ser: 0.78 mg/dL (ref 0.61–1.24)
GFR, Estimated: >60 mL/min (ref >60)
Glucose, Bld: 162 mg/dL — ABNORMAL HIGH (ref 70–99)
Potassium: 3.8 mmol/L (ref 3.5–5.1)
Sodium: 138 mmol/L (ref 135–145)
Total Bilirubin: 1.5 mg/dL — ABNORMAL HIGH (ref 0.3–1.2)
Total Protein: 5.9 g/dL — ABNORMAL LOW (ref 6.5–8.1)

## 2022-06-16 LAB — RAD ONC ARIA SESSION SUMMARY
Course Elapsed Days: 35
Plan Fractions Treated to Date: 25
Plan Prescribed Dose Per Fraction: 1.8 Gy
Plan Total Fractions Prescribed: 25
Plan Total Prescribed Dose: 45 Gy
Reference Point Dosage Given to Date: 45 Gy
Reference Point Session Dosage Given: 1.8 Gy
Session Number: 25

## 2022-06-16 LAB — MAGNESIUM: Magnesium: 1.6 mg/dL — ABNORMAL LOW (ref 1.7–2.4)

## 2022-06-16 MED ORDER — MAGNESIUM SULFATE 2 GM/50ML IV SOLN
2.0000 g | Freq: Once | INTRAVENOUS | Status: AC
  Administered 2022-06-16: 2 g via INTRAVENOUS
  Filled 2022-06-16: qty 50

## 2022-06-16 MED ORDER — HEPARIN SOD (PORK) LOCK FLUSH 100 UNIT/ML IV SOLN
500.0000 [IU] | Freq: Once | INTRAVENOUS | Status: AC
  Administered 2022-06-16: 500 [IU] via INTRAVENOUS

## 2022-06-16 MED ORDER — SODIUM CHLORIDE 0.9% FLUSH
10.0000 mL | Freq: Once | INTRAVENOUS | Status: AC
  Administered 2022-06-16: 10 mL via INTRAVENOUS

## 2022-06-16 MED ORDER — POTASSIUM CHLORIDE IN NACL 20-0.9 MEQ/L-% IV SOLN
Freq: Once | INTRAVENOUS | Status: AC
  Filled 2022-06-16: qty 1000

## 2022-06-16 NOTE — Progress Notes (Signed)
Nutrition Follow-up:  Patient with cancer of GE junction. He is receiving concurrent chemoradiation therapy with weekly carboplatin and paclitaxel (started 7/27).   Treatment held today due to thrombocytopenia/leukopenia - pt receiving IV fluids  Met with patient in infusion. He reports appetite is "great." He is snacking on nuts at visit. Patient recalls nuts for breakfast, bowl of chicken/orzo soup for lunch, shrimp paella for dinner. He snacked on nuts and consumed one Fairlife Shake (150 kcal, 30 g protein). Patient did not take complimentary Ensure case on 8/3. Patient states "it has too many chemicals and does not want this." Patient endorses improved nausea, says he only needed  antiemetics once last week. Patient reports cold sensitivity last one hour. Patient says he is no longer going for walks as he has no stamina at this time. Patient is not worried about weight loss since beginning of treatment, stating "its fine." Patient reports he met with surgeon ~10 days ago and "the surgeon is not concerned about about this."  Medications: reviewed   Labs: glucose 162, WBC 1.7, Hgb 8.9, Platelets 50, Mg 1.6  Anthropometrics: Weight 193 lb 8 oz today stable x 7 days  8/24 - 192 lb 14.4 oz 8/10 - 198 lb 9.6 oz  7/27 - 201 lb 4.8 oz   Weights down 7.2% (15 lbs) in 5 weeks; significant   NUTRITION DIAGNOSIS: Food and nutrition related knowledge deficit continues   INTERVENTION:  Reinforced education on the importance of increased intake of calorie and protein energy to maintain strength and promote weight maintenance, support post operative healing Encouraged 3 meals + snacks with adequate calories and protein Continue drinking ONS, suggested switching to [employer] for added calories - sample provided to pt today   MONITORING, EVALUATION, GOAL: weight trends, intake    NEXT VISIT: Friday October 6 after MD

## 2022-06-16 NOTE — Patient Instructions (Signed)
[facility]  Discharge Instructions: Thank you for choosing [facility] to provide your oncology and hematology care.  If you have a lab appointment with the [facility], please come in thru the Main Entrance and check in at the main information desk.  Wear comfortable clothing and clothing appropriate for easy access to any Portacath or PICC line.   We strive to give you quality time with your provider. You may need to reschedule your appointment if you arrive late (15 or more minutes).  Arriving late affects you and other patients whose appointments are after yours.  Also, if you miss three or more appointments without notifying the office, you may be dismissed from the clinic at the provider's discretion.      For prescription refill requests, have your pharmacy contact our office and allow 72 hours for refills to be completed.    Today you received the following chemotherapy and/or immunotherapy agents Hydration/ 2 grams Magnesium Sulfate IV and 20 mEq Potassium IV in 1 Liter of NS.       To help prevent nausea and vomiting after your treatment, we encourage you to take your nausea medication as directed.  BELOW ARE SYMPTOMS THAT SHOULD BE REPORTED IMMEDIATELY: *FEVER GREATER THAN 100.4 F (38 C) OR HIGHER *CHILLS OR SWEATING *NAUSEA AND VOMITING THAT IS NOT CONTROLLED WITH YOUR NAUSEA MEDICATION *UNUSUAL SHORTNESS OF BREATH *UNUSUAL BRUISING OR BLEEDING *URINARY PROBLEMS (pain or burning when urinating, or frequent urination) *BOWEL PROBLEMS (unusual diarrhea, constipation, pain near the anus) TENDERNESS IN MOUTH AND THROAT WITH OR WITHOUT PRESENCE OF ULCERS (sore throat, sores in mouth, or a toothache) UNUSUAL RASH, SWELLING OR PAIN  UNUSUAL VAGINAL DISCHARGE OR ITCHING   Items with * indicate a potential emergency and should be followed up as soon as possible or go to the Emergency Department if any problems should occur.  Please show the  CHEMOTHERAPY ALERT CARD or IMMUNOTHERAPY ALERT CARD at check-in to the Emergency Department and triage nurse.  Should you have questions after your visit or need to cancel or reschedule your appointment, please contact [facility] [phone]  and follow the prompts.  Office hours are 8:00 a.m. to 4:30 p.m. Monday - Friday. Please note that voicemails left after 4:00 p.m. may not be returned until the following business day.  We are closed weekends and major holidays. You have access to a nurse at all times for urgent questions. Please call the main number to the clinic [phone] and follow the prompts.  For any non-urgent questions, you may also contact your provider using MyChart. We now offer e-Visits for anyone 2 and older to request care online for non-urgent symptoms. For details visit [email or web address].   Also download the MyChart app! Go to the app store, search "MyChart", open the app, select [facility], and log in with your MyChart username and password.  Masks are optional in the cancer centers. If you would like for your care team to wear a mask while they are taking care of you, please let them know. You may have one support person who is at least 62 years old accompany you for your appointments.

## 2022-06-16 NOTE — Progress Notes (Signed)
Patient presents today for treatment and follow up visit with Dr. Delton Coombes. Vital signs within parameters for treatment. MAR reviewed and updated. Patient has complaints of left foot drop that is new since his last treatment. Patient has no complaints of pain. Platelets 50 today. All other labs within parameters for treatment.   Message received from [facility] / Dr. Delton Coombes. HOLD tx today due to platelets of 50. Give Neupogen 480 mcg injection and house fluids.   Zarxio not given due to Pre-authorization pending for injection. Patient to receive tomorrow at [facility].   House fluids given today per MD orders. Tolerated infusion without adverse affects. Vital signs stable. No complaints at this time. Discharged from clinic ambulatory in stable condition. Alert and oriented x 3. [facility].

## 2022-06-16 NOTE — Progress Notes (Signed)
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  PRIOR THERAPY: none  NGS Results: not done  CURRENT THERAPY: Chemoradiation therapy with weekly carboplatin and paclitaxel  BRIEF ONCOLOGIC HISTORY:  Oncology History  GE junction carcinoma ([facility])  04/12/2022 Initial Diagnosis   GE junction carcinoma ([facility])   05/12/2022 -  Chemotherapy   Patient is on Treatment Plan : ESOPHAGUS Carboplatin/PACLitaxel weekly x 6 weeks with XRT         CANCER STAGING:  Cancer Staging  GE junction carcinoma ([facility]) Staging form: Esophagus - Adenocarcinoma, AJCC 8th Edition - Clinical stage from 04/12/2022: Stage III (cT3, cN0, cM0, G3) - Unsigned   INTERVAL HISTORY:  [name] [name], a 61 y.o. male, seen for follow-up of GE junction adenocarcinoma.  He is here for toxicity assessment and possible week 6 of chemotherapy.  He has some cough with clear expectoration.  Energy levels are 90%.  Reports left foot drop which began about 7 days ago.  He is grazing all the time and has gained about 1 pound since last visit.  Trouble swallowing has improved with lidocaine.  REVIEW OF SYSTEMS:  Review of Systems  Constitutional:  Negative for appetite change, fatigue and unexpected weight change.  Respiratory:  Positive for cough and shortness of breath.   Neurological:  Negative for dizziness.  All other systems reviewed and are negative.   PAST MEDICAL/SURGICAL HISTORY:  Past Medical History:  Diagnosis Date   Port-A-Cath in place 05/11/2022   Past Surgical History:  Procedure Laterality Date   BIOPSY  03/25/2022   Procedure: BIOPSY;  Surgeon: Harvel Quale, MD;  [facility];  Service: Gastroenterology;;  GE junction;   BIOPSY  04/25/2022   Procedure:  BIOPSY;  Surgeon: Irving Copas., MD;  Location: [facility];  Service: Gastroenterology;;   COLONOSCOPY     COLONOSCOPY WITH PROPOFOL N/A 03/25/2022   Procedure: COLONOSCOPY WITH PROPOFOL;  Surgeon: Harvel Quale, MD;  Location: [facility];  Service: Gastroenterology;  Laterality: N/A;  945 ASA 1   ESOPHAGOGASTRODUODENOSCOPY (EGD) WITH PROPOFOL N/A 03/25/2022   Procedure: ESOPHAGOGASTRODUODENOSCOPY (EGD) WITH PROPOFOL;  Surgeon: Harvel Quale, MD;  Location: [facility];  Service: Gastroenterology;  Laterality: N/A;   ESOPHAGOGASTRODUODENOSCOPY (EGD) WITH PROPOFOL N/A 04/25/2022   Procedure: ESOPHAGOGASTRODUODENOSCOPY (EGD) WITH PROPOFOL;  Surgeon: Rush Landmark Telford Nab., MD;  Location: [facility];  Service: Gastroenterology;  Laterality: N/A;   IR IMAGING GUIDED PORT INSERTION  05/04/2022   UPPER ESOPHAGEAL ENDOSCOPIC ULTRASOUND (EUS) N/A 04/25/2022   Procedure: UPPER ESOPHAGEAL ENDOSCOPIC ULTRASOUND (EUS);  Surgeon: Irving Copas., MD;  Location: [facility];  Service: Gastroenterology;  Laterality: N/A;    SOCIAL HISTORY:  Social History   Socioeconomic History   Marital status: Married    Spouse name: Not on file   Number of children: Not on file   Years of education: Not on file   Highest education level: Not on file  Occupational History   Not on file  Tobacco Use   Smoking status: Never    Passive exposure: Past   Smokeless tobacco: Never  Vaping Use   Vaping Use: Never used  Substance and Sexual Activity   Alcohol use: Yes    Comment: 1 daily   Drug use: Never  Sexual activity: Yes  Other Topics Concern   Not on file  Social History Narrative   Not on file   Social Determinants of Health   Financial Resource Strain: Not on file  Food Insecurity: Not on file  Transportation Needs: Not on file  Physical Activity: Not on file  Stress: Not on file  Social Connections: Not on file  Intimate Partner Violence: Not on file     FAMILY HISTORY:  Family History  Problem Relation Age of Onset   Cancer Maternal Grandmother        Breast cancer   Cancer Other        Maternal Cousin - breast cancer    CURRENT MEDICATIONS:  Current Outpatient Medications  Medication Sig Dispense Refill   CARBOPLATIN IV Inject into the vein once a week.     lidocaine (XYLOCAINE) 2 % solution Use as directed 15 mLs in the mouth or throat every 4 (four) hours as needed for mouth pain. 100 mL 5   lidocaine-prilocaine (EMLA) cream Apply a small amount to port a cath site and cover with plastic wrap 1 hour prior to infusion appointments (Patient not taking: Reported on 06/02/2022) 30 g 3   omeprazole (PRILOSEC) 40 MG capsule Take 40 mg by mouth 2 (two) times daily.     PACLitaxel (TAXOL IV) Inject into the vein once a week.     prochlorperazine (COMPAZINE) 10 MG tablet Take 1 tablet (10 mg total) by mouth every 6 (six) hours as needed (Nausea or vomiting). 60 tablet 3   sucralfate (CARAFATE) 1 g tablet Take 1 tablet (1 g total) by mouth 4 (four) times daily -  with meals and at bedtime. Crush and dissolve in 10 mL of warm water prior to swallowing, take 20 minutes before meals. 120 tablet 1   No current facility-administered medications for this visit.    ALLERGIES:  No Known Allergies  PHYSICAL EXAM:  Performance status (ECOG): 0 - Asymptomatic  Vitals:   06/16/22 0855  BP: 104/67  Pulse: 99  Resp: 18  Temp: (!) 97.3 F (36.3 C)  SpO2: 100%   Wt Readings from Last 3 Encounters:  06/16/22 193 lb 8 oz (87.8 kg)  06/09/22 192 lb 14.4 oz (87.5 kg)  06/02/22 196 lb 4.8 oz (89 kg)   Physical Exam Vitals reviewed.  Constitutional:      Appearance: Normal appearance.  Cardiovascular:     Rate and Rhythm: Normal rate and regular rhythm.     Pulses: Normal pulses.     Heart sounds: Normal heart sounds.  Pulmonary:     Effort: Pulmonary effort is normal.     Breath sounds: Normal breath sounds.  Neurological:      General: No focal deficit present.     Mental Status: He is alert and oriented to person, place, and time.  Psychiatric:        Mood and Affect: Mood normal.        Behavior: Behavior normal.    LABORATORY DATA:  I have reviewed the labs as listed.     Latest Ref Rng & Units 06/09/2022    9:00 AM 06/02/2022    8:50 AM 05/26/2022    9:56 AM  CBC  WBC 4.0 - 10.5 K/uL 2.9  3.0  3.6   Hemoglobin 13.0 - 17.0 g/dL 11.2  11.3  12.9   Hematocrit 39.0 - 52.0 % 32.2  32.3  36.9   Platelets 150 - 400 K/uL 90  151  158       Latest Ref Rng & Units 06/09/2022    9:00 AM 06/02/2022    8:50 AM 05/26/2022    9:56 AM  CMP  Glucose 70 - 99 mg/dL 113  106  112   BUN 8 - 23 mg/dL '15  14  20   '$ Creatinine 0.61 - 1.24 mg/dL 0.93  0.79  0.84   Sodium 135 - 145 mmol/L 137  136  134   Potassium 3.5 - 5.1 mmol/L 4.2  4.0  4.3   Chloride 98 - 111 mmol/L 104  105  102   CO2 22 - 32 mmol/L '26  25  25   '$ Calcium 8.9 - 10.3 mg/dL 9.7  9.4  9.7   Total Protein 6.5 - 8.1 g/dL 6.6  6.5  7.1   Total Bilirubin 0.3 - 1.2 mg/dL 1.8  0.8  1.6   Alkaline Phos 38 - 126 U/L 47  46  56   AST 15 - 41 U/L '18  16  17   '$ ALT 0 - 44 U/L '14  12  15     '$ DIAGNOSTIC IMAGING:  I have independently reviewed the scans and discussed with the patient. No results found.   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  He lost about 60 pounds since September, was trying to lose weight by walking 7 miles a day. - EGD (03/25/2022): Large ulcerating mass with no bleeding found at the GE junction, 40-42 cm from incisors.  Mass appeared to extend 1 cm distal towards the cardia.  Partially obstructing and circumferential.  Normal stomach and duodenum. 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 - CT CAP (03/30/2022): Thickened folds of the GE junction without discrete focal mass.   No upstream dilatation.  No intrathoracic, axillary or abdominal adenopathy.  5 mm right upper lobe solitary nodule.  2.4 cm densely calcified right thyroid nodule.  Mild hepatic steatosis. - PET CT scan (04/07/2022): Hypermetabolic area at the GE junction SUV 6.93.  No signs of adenopathy or suspicious lung nodules.  No sign of other metastatic disease.  Left thyroid lesion with moderately increased metabolic activity, SUV 0.92 - EGD/EUS (04/25/2022): Malignant esophageal tumor at distal esophagus extending into GE junction and into the cardia.  EUS staging T3N0.  No malignant appearing lymph nodes visualized.  Mass extends from 37 cm to 42 cm (esophageal portion is 37-40 cm).  Lesion was circumferential.  Sonographic evidence suggesting invasion into 1 through the muscularis propria.  Intact interface seen between the mass and adjacent structures. - Dr. Kipp Brood recommended surgery between 6 to 10 weeks after completion of chemoradiation with repeat PET scan in 4 to 6 weeks after last dose of radiation. - Concurrent chemoradiation with carboplatin and paclitaxel followed by surgery and possibly adjuvant nivolumab for 1 year (Checkmate 577) if no complete pathological response is achieved. - Concurrent chemoradiation therapy started on 05/12/2022.    Social/family history: - Lives at home with his wife.  He is a retired Electronics engineer at [employer].  Non-smoker.  Drinks alcohol 5 days/week, moderate. - Maternal grandmother had breast cancer.  Maternal cousin had metastatic breast cancer.  2.  Incidental PET positive left thyroid nodule: - Biopsy of the right thyroid nodule on 12/18/74, scant follicular epithelium, Bethesda category 1. - He will need work-up for left thyroid nodule after completion of treatments.  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  White count is 1.7 with ANC of 1.5.  Platelet count is 50 K.  Hemoglobin  is 8.9. - We will hold his chemotherapy due to cytopenias.  We will give him G-CSF 480 mcg today or tomorrow.  We will check his counts next week.  If his platelet count improves, we will proceed with last cycle of chemotherapy. - His PET scan is scheduled on 07/21/2022 followed by appointment with Dr. Kipp Brood. - Today he will receive IV fluids with magnesium.  His magnesium is slightly low at 1.6.  2.  Weight loss: - He had lost 20+ pounds since start of therapy.  However in the last 1 week he has gained 1 pound.  He is eating better and using lidocaine as needed.  3.  Dizziness: - This has resolved when we started giving him fluids on Mondays, Wednesday and Friday.  4.  Left foot drop: - He has experienced left foot drop in the last 1 week.  We will send [facility] to [facility] for splint.   Orders placed this encounter:  No orders of the defined types were placed in this encounter.    Derek Jack, MD [facility] [phone]

## 2022-06-16 NOTE — Patient Instructions (Addendum)
[facility] at [facility] Discharge Instructions   You were seen and examined today by Dr. Delton Coombes.  He reviewed the results of your lab work which are mostly stable. Your platelet count is 50,000. We will hold treatment today and give you IV fluids.   We will arrange for you to have a white blood cell booster shot today in the clinic also. Your white count was low at 1.7.   Return as scheduled next week.    Thank you for choosing [facility] at [facility] to provide your oncology and hematology care.  To afford each patient quality time with our provider, please arrive at least 15 minutes before your scheduled appointment time.   If you have a lab appointment with the [facility] please come in thru the Main Entrance and check in at the main information desk.  You need to re-schedule your appointment should you arrive 10 or more minutes late.  We strive to give you quality time with our providers, and arriving late affects you and other patients whose appointments are after yours.  Also, if you no show three or more times for appointments you may be dismissed from the clinic at the providers discretion.     Again, thank you for choosing [facility].  Our hope is that these requests will decrease the amount of time that you wait before being seen by our physicians.       _____________________________________________________________  Should you have questions after your visit to [facility], please contact our office at [phone] and follow the prompts.  Our office hours are 8:00 a.m. and 4:30 p.m. Monday - Friday.  Please note that voicemails left after 4:00 p.m. may not be returned until the following business day.  We are closed weekends and major holidays.  You do have access to a nurse 24-7, just call the main number to the clinic [phone] and do not press any options, hold on the line and a nurse will  answer the phone.    For prescription refill requests, have your pharmacy contact our office and allow 72 hours.    Due to Covid, you will need to wear a mask upon entering the hospital. If you do not have a mask, a mask will be given to you at the Main Entrance upon arrival. For doctor visits, patients may have 1 support person age 16 or older with them. For treatment visits, patients can not have anyone with them due to social distancing guidelines and our immunocompromised population.

## 2022-06-17 ENCOUNTER — Inpatient Hospital Stay: Payer: No Typology Code available for payment source | Attending: Hematology

## 2022-06-17 ENCOUNTER — Ambulatory Visit: Payer: No Typology Code available for payment source

## 2022-06-17 ENCOUNTER — Other Ambulatory Visit: Payer: Self-pay

## 2022-06-17 ENCOUNTER — Ambulatory Visit
Admission: RE | Admit: 2022-06-17 | Discharge: 2022-06-17 | Disposition: A | Discharge status: Home / Self Care | Payer: No Typology Code available for payment source | Source: Ambulatory Visit | Attending: Radiation Oncology | Admitting: Radiation Oncology

## 2022-06-17 VITALS — BP 111/73 | HR 77 | Temp 98.0°F | Resp 17

## 2022-06-17 DIAGNOSIS — Z510 Encounter for antineoplastic radiation therapy: Secondary | ICD-10-CM | POA: Insufficient documentation

## 2022-06-17 DIAGNOSIS — Z79899 Other long term (current) drug therapy: Secondary | ICD-10-CM | POA: Insufficient documentation

## 2022-06-17 DIAGNOSIS — R42 Dizziness and giddiness: Secondary | ICD-10-CM | POA: Insufficient documentation

## 2022-06-17 DIAGNOSIS — Z5189 Encounter for other specified aftercare: Secondary | ICD-10-CM | POA: Diagnosis not present

## 2022-06-17 DIAGNOSIS — D709 Neutropenia, unspecified: Secondary | ICD-10-CM | POA: Insufficient documentation

## 2022-06-17 DIAGNOSIS — C160 Malignant neoplasm of cardia: Secondary | ICD-10-CM | POA: Insufficient documentation

## 2022-06-17 DIAGNOSIS — Z9221 Personal history of antineoplastic chemotherapy: Secondary | ICD-10-CM | POA: Insufficient documentation

## 2022-06-17 DIAGNOSIS — D702 Other drug-induced agranulocytosis: Secondary | ICD-10-CM | POA: Diagnosis not present

## 2022-06-17 DIAGNOSIS — T451X5A Adverse effect of antineoplastic and immunosuppressive drugs, initial encounter: Secondary | ICD-10-CM | POA: Diagnosis not present

## 2022-06-17 LAB — RAD ONC ARIA SESSION SUMMARY
Course Elapsed Days: 36
Plan Fractions Treated to Date: 1
Plan Prescribed Dose Per Fraction: 1.8 Gy
Plan Total Fractions Prescribed: 3
Plan Total Prescribed Dose: 5.4 Gy
Reference Point Dosage Given to Date: 1.8 Gy
Reference Point Session Dosage Given: 1.8 Gy
Session Number: 26

## 2022-06-17 MED ORDER — FILGRASTIM-SNDZ 480 MCG/0.8ML IJ SOSY: 480.0000 ug | PREFILLED_SYRINGE | Freq: Once | INTRAMUSCULAR | Status: DC

## 2022-06-17 MED ORDER — SODIUM CHLORIDE 0.9 % IV SOLN: INTRAVENOUS | Status: AC

## 2022-06-17 NOTE — Patient Instructions (Signed)
Dehydration, Adult Dehydration is a condition in which there is not enough water or other fluids in the body. This happens when a person loses more fluids than he or she takes in. Important organs, such as the kidneys, brain, and heart, cannot function without a proper amount of fluids. Any loss of fluids from the body can lead to dehydration. Dehydration can be mild, moderate, or severe. It should be treated right away to prevent it from becoming severe. What are the causes? Dehydration may be caused by: Conditions that cause loss of water or other fluids, such as diarrhea, vomiting, or sweating or urinating a lot. Not drinking enough fluids, especially when you are ill or doing activities that require a lot of energy. Other illnesses and conditions, such as fever or infection. Certain medicines, such as medicines that remove excess fluid from the body (diuretics). Lack of safe drinking water. Not being able to get enough water and food. What increases the risk? The following factors may make you more likely to develop this condition: Having a long-term (chronic) illness that has not been treated properly, such as diabetes, heart disease, or kidney disease. Being 65 years of age or older. Having a disability. Living in a place that is high in altitude, where thinner, drier air causes more fluid loss. Doing exercises that put stress on your body for a long time (endurance sports). What are the signs or symptoms? Symptoms of dehydration depend on how severe it is. Mild or moderate dehydration Thirst. Dry lips or dry mouth. Dizziness or light-headedness, especially when standing up from a seated position. Muscle cramps. Dark urine. Urine may be the color of tea. Less urine or tears produced than usual. Headache. Severe dehydration Changes in skin. Your skin may be cold and clammy, blotchy, or pale. Your skin also may not return to normal after being lightly pinched and released. Little or  no tears, urine, or sweat. Changes in vital signs, such as rapid breathing and low blood pressure. Your pulse may be weak or may be faster than 100 beats a minute when you are sitting still. Other changes, such as: Feeling very thirsty. Sunken eyes. Cold hands and feet. Confusion. Being very tired (lethargic) or having trouble waking from sleep. Short-term weight loss. Loss of consciousness. How is this diagnosed? This condition is diagnosed based on your symptoms and a physical exam. You may have blood and urine tests to help confirm the diagnosis. How is this treated? Treatment for this condition depends on how severe it is. Treatment should be started right away. Do not wait until dehydration becomes severe. Severe dehydration is an emergency and needs to be treated in a hospital. Mild or moderate dehydration can be treated at home. You may be asked to: Drink more fluids. Drink an oral rehydration solution (ORS). This drink helps restore proper amounts of fluids and salts and minerals in the blood (electrolytes). Severe dehydration can be treated: With IV fluids. By correcting abnormal levels of electrolytes. This is often done by giving electrolytes through a tube that is passed through your nose and into your stomach (nasogastric tube, or NG tube). By treating the underlying cause of dehydration. Follow these instructions at home: Oral rehydration solution If told by your health care provider, drink an ORS: Make an ORS by following instructions on the package. Start by drinking small amounts, about  cup (120 mL) every 5-10 minutes. Slowly increase how much you drink until you have taken the amount recommended by your health   care provider. Eating and drinking        Drink enough clear fluid to keep your urine pale yellow. If you were told to drink an ORS, finish the ORS first and then start slowly drinking other clear fluids. Drink fluids such as: Water. Do not drink only  water. Doing that can lead to hyponatremia, which is having too little salt (sodium) in the body. Water from ice chips you suck on. Fruit juice that you have added water to (diluted fruit juice). Low-calorie sports drinks. Eat foods that contain a healthy balance of electrolytes, such as bananas, oranges, potatoes, tomatoes, and spinach. Do not drink alcohol. Avoid the following: Drinks that contain a lot of sugar. These include high-calorie sports drinks, fruit juice that is not diluted, and soda. Caffeine. Foods that are greasy or contain a lot of fat or sugar. General instructions Take over-the-counter and prescription medicines only as told by your health care provider. Do not take sodium tablets. Doing that can lead to having too much sodium in the body (hypernatremia). Return to your normal activities as told by your health care provider. Ask your health care provider what activities are safe for you. Keep all follow-up visits as told by your health care provider. This is important. Contact a health care provider if: You have muscle cramps, pain, or discomfort, such as: Pain in your abdomen and the pain gets worse or stays in one area (localizes). Stiff neck. You have a rash. You are more irritable than usual. You are sleepier or have a harder time waking than usual. You feel weak or dizzy. You feel very thirsty. Get help right away if you have: Any symptoms of severe dehydration. Symptoms of vomiting, such as: You cannot eat or drink without vomiting. Vomiting gets worse or does not go away. Vomit includes blood or green matter (bile). Symptoms that get worse with treatment. A fever. A severe headache. Problems with urination or bowel movements, such as: Diarrhea that gets worse or does not go away. Blood in your stool (feces). This may cause stool to look black and tarry. Not urinating, or urinating only a small amount of very dark urine, within 6-8 hours. Trouble  breathing. These symptoms may represent a serious problem that is an emergency. Do not wait to see if the symptoms will go away. Get medical help right away. Call your local emergency services (911 in the [place] Do not drive yourself to the hospital. Summary Dehydration is a condition in which there is not enough water or other fluids in the body. This happens when a person loses more fluids than he or she takes in. Treatment for this condition depends on how severe it is. Treatment should be started right away. Do not wait until dehydration becomes severe. Drink enough clear fluid to keep your urine pale yellow. If you were told to drink an oral rehydration solution (ORS), finish the ORS first and then start slowly drinking other clear fluids. Take over-the-counter and prescription medicines only as told by your health care provider. Get help right away if you have any symptoms of severe dehydration. This information is not intended to replace advice given to you by your health care provider. Make sure you discuss any questions you have with your health care provider. Document Revised: 02/09/2022 Document Reviewed: 05/16/2019 Elsevier Patient Education  2023 Elsevier Inc.  

## 2022-06-21 ENCOUNTER — Ambulatory Visit
Admission: RE | Admit: 2022-06-21 | Discharge: 2022-06-21 | Disposition: A | Discharge status: Home / Self Care | Payer: No Typology Code available for payment source | Source: Ambulatory Visit | Attending: Radiation Oncology | Admitting: Radiation Oncology

## 2022-06-21 ENCOUNTER — Other Ambulatory Visit: Payer: Self-pay

## 2022-06-21 ENCOUNTER — Ambulatory Visit: Payer: No Typology Code available for payment source

## 2022-06-21 DIAGNOSIS — Z510 Encounter for antineoplastic radiation therapy: Secondary | ICD-10-CM | POA: Diagnosis not present

## 2022-06-21 LAB — RAD ONC ARIA SESSION SUMMARY
Course Elapsed Days: 40
Plan Fractions Treated to Date: 2
Plan Prescribed Dose Per Fraction: 1.8 Gy
Plan Total Fractions Prescribed: 3
Plan Total Prescribed Dose: 5.4 Gy
Reference Point Dosage Given to Date: 3.6 Gy
Reference Point Session Dosage Given: 1.8 Gy
Session Number: 27

## 2022-06-22 ENCOUNTER — Other Ambulatory Visit: Payer: Self-pay

## 2022-06-22 ENCOUNTER — Inpatient Hospital Stay: Payer: No Typology Code available for payment source

## 2022-06-22 ENCOUNTER — Encounter: Payer: Self-pay | Admitting: Radiation Oncology

## 2022-06-22 ENCOUNTER — Ambulatory Visit
Admission: RE | Admit: 2022-06-22 | Discharge: 2022-06-22 | Disposition: A | Discharge status: Home / Self Care | Payer: No Typology Code available for payment source | Source: Ambulatory Visit | Attending: Radiation Oncology | Admitting: Radiation Oncology

## 2022-06-22 ENCOUNTER — Ambulatory Visit: Payer: No Typology Code available for payment source

## 2022-06-22 DIAGNOSIS — C160 Malignant neoplasm of cardia: Secondary | ICD-10-CM

## 2022-06-22 DIAGNOSIS — Z510 Encounter for antineoplastic radiation therapy: Secondary | ICD-10-CM | POA: Diagnosis not present

## 2022-06-22 LAB — RAD ONC ARIA SESSION SUMMARY
Course Elapsed Days: 41
Plan Fractions Treated to Date: 3
Plan Prescribed Dose Per Fraction: 1.8 Gy
Plan Total Fractions Prescribed: 3
Plan Total Prescribed Dose: 5.4 Gy
Reference Point Dosage Given to Date: 5.4 Gy
Reference Point Session Dosage Given: 1.8 Gy
Session Number: 28

## 2022-06-22 MED ORDER — FILGRASTIM-SNDZ 480 MCG/0.8ML IJ SOSY
480.0000 ug | PREFILLED_SYRINGE | Freq: Once | INTRAMUSCULAR | Status: AC
Start: 2022-06-22 — End: 2022-06-22
  Administered 2022-06-22: 480 ug via SUBCUTANEOUS
  Filled 2022-06-22: qty 0.8

## 2022-06-22 MED ORDER — SODIUM CHLORIDE 0.9% FLUSH
10.0000 mL | Freq: Once | INTRAVENOUS | Status: AC
  Administered 2022-06-22: 10 mL via INTRAVENOUS

## 2022-06-22 MED ORDER — SODIUM CHLORIDE 0.9 % IV SOLN: INTRAVENOUS | Status: AC

## 2022-06-22 MED ORDER — HEPARIN SOD (PORK) LOCK FLUSH 100 UNIT/ML IV SOLN
500.0000 [IU] | Freq: Once | INTRAVENOUS | Status: AC
  Administered 2022-06-22: 500 [IU] via INTRAVENOUS

## 2022-06-22 MED ORDER — SODIUM CHLORIDE 0.9 % IV SOLN: INTRAVENOUS | Status: DC

## 2022-06-22 NOTE — Patient Instructions (Signed)
Filgrastim Injection What is this medication? FILGRASTIM (fil GRA stim) lowers the risk of infection in people who are receiving chemotherapy. It works by helping your body make more white blood cells, which protects your body from infection. It may also be used to help people who have been exposed to high doses of radiation. It can be used to help prepare your body before a stem cell transplant. It works by helping your bone marrow make and release stem cells into the blood. This medicine may be used for other purposes; ask your health care provider or pharmacist if you have questions. COMMON BRAND NAME(S): Neupogen, Nivestym, Releuko, Zarxio What should I tell my care team before I take this medication? They need to know if you have any of these conditions: History of blood diseases, such as sickle cell anemia Kidney disease Recent or ongoing radiation An unusual or allergic reaction to filgrastim, pegfilgrastim, latex, rubber, other medications, foods, dyes, or preservatives Pregnant or trying to get pregnant Breast-feeding How should I use this medication? This medication is injected under the skin or into a vein. It is usually given by your care team in a hospital or clinic setting. It may be given at home. If you get this medication at home, you will be taught how to prepare and give it. Use exactly as directed. Take it as directed on the prescription label at the same time every day. Keep taking it unless your care team tells you to stop. It is important that you put your used needles and syringes in a special sharps container. Do not put them in a trash can. If you do not have a sharps container, call your pharmacist or care team to get one. This medication comes with INSTRUCTIONS FOR USE. Ask your pharmacist for directions on how to use this medication. Read the information carefully. Talk to your pharmacist or care team if you have questions. Talk to your care team about the use of this  medication in children. While it may be prescribed for children for selected conditions, precautions do apply. Overdosage: If you think you have taken too much of this medicine contact a poison control center or emergency room at once. NOTE: This medicine is only for you. Do not share this medicine with others. What if I miss a dose? It is important not to miss any doses. Talk to your care team about what to do if you miss a dose. What may interact with this medication? Medications that may cause a release of neutrophils, such as lithium This list may not describe all possible interactions. Give your health care provider a list of all the medicines, herbs, non-prescription drugs, or dietary supplements you use. Also tell them if you smoke, drink alcohol, or use illegal drugs. Some items may interact with your medicine. What should I watch for while using this medication? Your condition will be monitored carefully while you are receiving this medication. You may need bloodwork while taking this medication. Talk to your care team about your risk of cancer. You may be more at risk for certain types of cancer if you take this medication. What side effects may I notice from receiving this medication? Side effects that you should report to your care team as soon as possible: Allergic reactions--skin rash, itching, hives, swelling of the face, lips, tongue, or throat Capillary leak syndrome--stomach or muscle pain, unusual weakness or fatigue, feeling faint or lightheaded, decrease in the amount of urine, swelling of the ankles, hands, or   feet, trouble breathing High white blood cell level--fever, fatigue, trouble breathing, night sweats, change in vision, weight loss Inflammation of the aorta--fever, fatigue, back, chest, or stomach pain, severe headache Kidney injury (glomerulonephritis)--decrease in the amount of urine, red or dark brown urine, foamy or bubbly urine, swelling of the ankles, hands, or  feet Shortness of breath or trouble breathing Spleen injury--pain in upper left stomach or shoulder Unusual bruising or bleeding Side effects that usually do not require medical attention (report to your care team if they continue or are bothersome): Back pain Bone pain Fatigue Fever Headache Nausea This list may not describe all possible side effects. Call your doctor for medical advice about side effects. You may report side effects to FDA at 1-800-FDA-1088. Where should I keep my medication? Keep out of the reach of children and pets. Keep this medication in the original packaging until you are ready to take it. Protect from light. See product for storage information. Each product may have different instructions. Get rid of any unused medication after the expiration date. To get rid of medications that are no longer needed or have expired: Take the medication to a medications take-back program. Check with your pharmacy or law enforcement to find a location. If you cannot return the medication, ask your pharmacist or care team how to get rid of this medication safely. NOTE: This sheet is a summary. It may not cover all possible information. If you have questions about this medicine, talk to your doctor, pharmacist, or health care provider.  2023 Elsevier/Gold Standard (2022-01-11 00:00:00)  

## 2022-06-23 ENCOUNTER — Inpatient Hospital Stay (HOSPITAL_BASED_OUTPATIENT_CLINIC_OR_DEPARTMENT_OTHER): Payer: No Typology Code available for payment source | Admitting: Hematology

## 2022-06-23 ENCOUNTER — Inpatient Hospital Stay: Payer: No Typology Code available for payment source

## 2022-06-23 ENCOUNTER — Inpatient Hospital Stay: Payer: No Typology Code available for payment source | Attending: Hematology

## 2022-06-23 VITALS — BP 88/63 | HR 122 | Temp 98.2°F | Resp 16 | Ht 70.0 in | Wt 190.5 lb

## 2022-06-23 VITALS — BP 106/71 | HR 97 | Temp 98.3°F | Resp 18

## 2022-06-23 DIAGNOSIS — Z803 Family history of malignant neoplasm of breast: Secondary | ICD-10-CM | POA: Insufficient documentation

## 2022-06-23 DIAGNOSIS — C160 Malignant neoplasm of cardia: Secondary | ICD-10-CM | POA: Diagnosis present

## 2022-06-23 DIAGNOSIS — R42 Dizziness and giddiness: Secondary | ICD-10-CM | POA: Diagnosis not present

## 2022-06-23 DIAGNOSIS — D709 Neutropenia, unspecified: Secondary | ICD-10-CM | POA: Insufficient documentation

## 2022-06-23 DIAGNOSIS — E041 Nontoxic single thyroid nodule: Secondary | ICD-10-CM | POA: Diagnosis not present

## 2022-06-23 DIAGNOSIS — Z5111 Encounter for antineoplastic chemotherapy: Secondary | ICD-10-CM | POA: Diagnosis present

## 2022-06-23 DIAGNOSIS — M21372 Foot drop, left foot: Secondary | ICD-10-CM | POA: Insufficient documentation

## 2022-06-23 DIAGNOSIS — Z923 Personal history of irradiation: Secondary | ICD-10-CM | POA: Diagnosis not present

## 2022-06-23 DIAGNOSIS — Z95828 Presence of other vascular implants and grafts: Secondary | ICD-10-CM

## 2022-06-23 DIAGNOSIS — D696 Thrombocytopenia, unspecified: Secondary | ICD-10-CM | POA: Insufficient documentation

## 2022-06-23 LAB — COMPREHENSIVE METABOLIC PANEL
ALT: 11 U/L (ref 0–44)
AST: 16 U/L (ref 15–41)
Albumin: 3.6 g/dL (ref 3.5–5.0)
Alkaline Phosphatase: 52 U/L (ref 38–126)
Anion gap: 5 (ref 5–15)
BUN: 11 mg/dL (ref 8–23)
CO2: 26 mmol/L (ref 22–32)
Calcium: 9.5 mg/dL (ref 8.9–10.3)
Chloride: 106 mmol/L (ref 98–111)
Creatinine, Ser: 0.73 mg/dL (ref 0.61–1.24)
GFR, Estimated: >60 mL/min (ref >60)
Glucose, Bld: 110 mg/dL — ABNORMAL HIGH (ref 70–99)
Potassium: 3.9 mmol/L (ref 3.5–5.1)
Sodium: 137 mmol/L (ref 135–145)
Total Bilirubin: 1.8 mg/dL — ABNORMAL HIGH (ref 0.3–1.2)
Total Protein: 6.4 g/dL — ABNORMAL LOW (ref 6.5–8.1)

## 2022-06-23 LAB — CBC WITH DIFFERENTIAL/PLATELET
Abs Immature Granulocytes: 0.2 10*3/uL — ABNORMAL HIGH (ref 0.00–0.07)
Band Neutrophils: 2 %
Basophils Absolute: 0 10*3/uL (ref 0.0–0.1)
Basophils Relative: 0 %
Eosinophils Absolute: 0 10*3/uL (ref 0.0–0.5)
Eosinophils Relative: 0 %
HCT: 28.7 % — ABNORMAL LOW (ref 39.0–52.0)
Hemoglobin: 10 g/dL — ABNORMAL LOW (ref 13.0–17.0)
Lymphocytes Relative: 6 %
Lymphs Abs: 0.3 10*3/uL — ABNORMAL LOW (ref 0.7–4.0)
MCH: 32.4 pg (ref 26.0–34.0)
MCHC: 34.8 g/dL (ref 30.0–36.0)
MCV: 92.9 fL (ref 80.0–100.0)
Metamyelocytes Relative: 5 %
Monocytes Absolute: 0.4 10*3/uL (ref 0.1–1.0)
Monocytes Relative: 9 %
Neutro Abs: 3.7 10*3/uL (ref 1.7–7.7)
Neutrophils Relative %: 78 %
Platelets: 82 10*3/uL — ABNORMAL LOW (ref 150–400)
RBC: 3.09 MIL/uL — ABNORMAL LOW (ref 4.22–5.81)
RDW: 18.6 % — ABNORMAL HIGH (ref 11.5–15.5)
WBC: 4.6 10*3/uL (ref 4.0–10.5)
nRBC: 0 % (ref 0.0–0.2)

## 2022-06-23 LAB — MAGNESIUM: Magnesium: 1.7 mg/dL (ref 1.7–2.4)

## 2022-06-23 MED ORDER — SODIUM CHLORIDE 0.9 % IV SOLN
10.0000 mg | Freq: Once | INTRAVENOUS | Status: AC
  Administered 2022-06-23: 10 mg via INTRAVENOUS
  Filled 2022-06-23: qty 10

## 2022-06-23 MED ORDER — FAMOTIDINE IN NACL 20-0.9 MG/50ML-% IV SOLN
20.0000 mg | Freq: Once | INTRAVENOUS | Status: AC
  Administered 2022-06-23: 20 mg via INTRAVENOUS
  Filled 2022-06-23: qty 50

## 2022-06-23 MED ORDER — SODIUM CHLORIDE 0.9% FLUSH
10.0000 mL | INTRAVENOUS | Status: DC | PRN
  Administered 2022-06-23: 10 mL

## 2022-06-23 MED ORDER — HEPARIN SOD (PORK) LOCK FLUSH 100 UNIT/ML IV SOLN
500.0000 [IU] | Freq: Once | INTRAVENOUS | Status: AC | PRN
  Administered 2022-06-23: 500 [IU]

## 2022-06-23 MED ORDER — POTASSIUM CHLORIDE IN NACL 20-0.9 MEQ/L-% IV SOLN
Freq: Once | INTRAVENOUS | Status: AC
  Filled 2022-06-23: qty 1000

## 2022-06-23 MED ORDER — DIPHENHYDRAMINE HCL 50 MG/ML IJ SOLN
50.0000 mg | Freq: Once | INTRAMUSCULAR | Status: AC
  Administered 2022-06-23: 50 mg via INTRAVENOUS
  Filled 2022-06-23: qty 1

## 2022-06-23 MED ORDER — PALONOSETRON HCL INJECTION 0.25 MG/5ML
0.2500 mg | Freq: Once | INTRAVENOUS | Status: AC
  Administered 2022-06-23: 0.25 mg via INTRAVENOUS
  Filled 2022-06-23: qty 5

## 2022-06-23 MED ORDER — SODIUM CHLORIDE 0.9 % IV SOLN
300.0000 mg | Freq: Once | INTRAVENOUS | Status: AC
  Administered 2022-06-23: 300 mg via INTRAVENOUS
  Filled 2022-06-23: qty 30

## 2022-06-23 MED ORDER — SODIUM CHLORIDE 0.9 % IV SOLN
50.0000 mg/m2 | Freq: Once | INTRAVENOUS | Status: AC
  Administered 2022-06-23: 108 mg via INTRAVENOUS
  Filled 2022-06-23: qty 18

## 2022-06-23 MED ORDER — MAGNESIUM SULFATE 2 GM/50ML IV SOLN
2.0000 g | Freq: Once | INTRAVENOUS | Status: AC
  Administered 2022-06-23: 2 g via INTRAVENOUS
  Filled 2022-06-23: qty 50

## 2022-06-23 MED ORDER — SODIUM CHLORIDE 0.9 % IV SOLN: Freq: Once | INTRAVENOUS | Status: AC

## 2022-06-23 NOTE — Progress Notes (Signed)
Patient has been examined by Dr. Delton Coombes, and vital signs (BP 88 systolic) and labs have been reviewed. ANC, Creatinine, LFTs (bilirubin 1.8), hemoglobin, and platelets (82) are within treatment parameters per M.D. - pt may proceed with treatment.  Primary RN and pharmacy notified.

## 2022-06-23 NOTE — Patient Instructions (Addendum)
[place] at [facility] Discharge Instructions   You were seen and examined today by Dr. Delton Coombes.  He reviewed the results of your lab work. Your platelet count is 82 today, sufficient for treatment. All other results are normal/stable.   We will proceed with your final treatment today.   Return as scheduled.    Thank you for choosing [place] at [facility] to provide your oncology and hematology care.  To afford each patient quality time with our provider, please arrive at least 15 minutes before your scheduled appointment time.   If you have a lab appointment with the [facility] please come in thru the Main Entrance and check in at the main information desk.  You need to re-schedule your appointment should you arrive 10 or more minutes late.  We strive to give you quality time with our providers, and arriving late affects you and other patients whose appointments are after yours.  Also, if you no show three or more times for appointments you may be dismissed from the clinic at the providers discretion.     Again, thank you for choosing [facility].  Our hope is that these requests will decrease the amount of time that you wait before being seen by our physicians.       _____________________________________________________________  Should you have questions after your visit to [facility], please contact our office at [phone] and follow the prompts.  Our office hours are 8:00 a.m. and 4:30 p.m. Monday - Friday.  Please note that voicemails left after 4:00 p.m. may not be returned until the following business day.  We are closed weekends and major holidays.  You do have access to a nurse 24-7, just call the main number to the clinic [phone] and do not press any options, hold on the line and a nurse will answer the phone.    For prescription refill requests, have your pharmacy contact our office and  allow 72 hours.    Due to Covid, you will need to wear a mask upon entering the hospital. If you do not have a mask, a mask will be given to you at the Main Entrance upon arrival. For doctor visits, patients may have 1 support person age 45 or older with them. For treatment visits, patients can not have anyone with them due to social distancing guidelines and our immunocompromised population.

## 2022-06-23 NOTE — Progress Notes (Signed)
Patient tolerated chemotherapy with no complaints voiced.  Side effects with management reviewed with understanding verbalized.  Port site clean and dry with no bruising or swelling noted at site.  Good blood return noted before and after administration of chemotherapy.  Band aid applied.  Patient left in satisfactory condition with VSS and no s/s of distress noted.   

## 2022-06-23 NOTE — Patient Instructions (Signed)
[facility]  Discharge Instructions: Thank you for choosing [facility] to provide your oncology and hematology care.  If you have a lab appointment with the [facility], please come in thru the Main Entrance and check in at the main information desk.  Wear comfortable clothing and clothing appropriate for easy access to any Portacath or PICC line.   We strive to give you quality time with your provider. You may need to reschedule your appointment if you arrive late (15 or more minutes).  Arriving late affects you and other patients whose appointments are after yours.  Also, if you miss three or more appointments without notifying the office, you may be dismissed from the clinic at the provider's discretion.      For prescription refill requests, have your pharmacy contact our office and allow 72 hours for refills to be completed.    Today you received the following chemotherapy and/or immunotherapy agents taxol and carboplatin.  Paclitaxel Injection What is this medication? PACLITAXEL (PAK li TAX el) treats some types of cancer. It works by slowing down the growth of cancer cells. This medicine may be used for other purposes; ask your health care provider or pharmacist if you have questions. COMMON BRAND NAME(S): Onxol, Taxol What should I tell my care team before I take this medication? They need to know if you have any of these conditions: Heart disease Liver disease Low white blood cell levels An unusual or allergic reaction to paclitaxel, other medications, foods, dyes, or preservatives If you or your partner are pregnant or trying to get pregnant Breast-feeding How should I use this medication? This medication is injected into a vein. It is given by your care team in a hospital or clinic setting. Talk to your care team about the use of this medication in children. While it may be given to children for selected conditions, precautions do  apply. Overdosage: If you think you have taken too much of this medicine contact a poison control center or emergency room at once. NOTE: This medicine is only for you. Do not share this medicine with others. What if I miss a dose? Keep appointments for follow-up doses. It is important not to miss your dose. Call your care team if you are unable to keep an appointment. What may interact with this medication? Do not take this medication with any of the following: Live virus vaccines Other medications may affect the way this medication works. Talk with your care team about all of the medications you take. They may suggest changes to your treatment plan to lower the risk of side effects and to make sure your medications work as intended. This list may not describe all possible interactions. Give your health care provider a list of all the medicines, herbs, non-prescription drugs, or dietary supplements you use. Also tell them if you smoke, drink alcohol, or use illegal drugs. Some items may interact with your medicine. What should I watch for while using this medication? Your condition will be monitored carefully while you are receiving this medication. You may need blood work while taking this medication. This medication may make you feel generally unwell. This is not uncommon as chemotherapy can affect healthy cells as well as cancer cells. Report any side effects. Continue your course of treatment even though you feel ill unless your care team tells you to stop. This medication can cause serious allergic reactions. To reduce the risk, your care team may give you other  medications to take before receiving this one. Be sure to follow the directions from your care team. This medication may increase your risk of getting an infection. Call your care team for advice if you get a fever, chills, sore throat, or other symptoms of a cold or flu. Do not treat yourself. Try to avoid being around people who are  sick. This medication may increase your risk to bruise or bleed. Call your care team if you notice any unusual bleeding. Be careful brushing or flossing your teeth or using a toothpick because you may get an infection or bleed more easily. If you have any dental work done, tell your dentist you are receiving this medication. Talk to your care team if you may be pregnant. Serious birth defects can occur if you take this medication during pregnancy. Talk to your care team before breastfeeding. Changes to your treatment plan may be needed. What side effects may I notice from receiving this medication? Side effects that you should report to your care team as soon as possible: Allergic reactions--skin rash, itching, hives, swelling of the face, lips, tongue, or throat Heart rhythm changes--fast or irregular heartbeat, dizziness, feeling faint or lightheaded, chest pain, trouble breathing Increase in blood pressure Infection--fever, chills, cough, sore throat, wounds that don't heal, pain or trouble when passing urine, general feeling of discomfort or being unwell Low blood pressure--dizziness, feeling faint or lightheaded, blurry vision Low red blood cell level--unusual weakness or fatigue, dizziness, headache, trouble breathing Painful swelling, warmth, or redness of the skin, blisters or sores at the infusion site Pain, tingling, or numbness in the hands or feet Slow heartbeat--dizziness, feeling faint or lightheaded, confusion, trouble breathing, unusual weakness or fatigue Unusual bruising or bleeding Side effects that usually do not require medical attention (report to your care team if they continue or are bothersome): Diarrhea Hair loss Joint pain Loss of appetite Muscle pain Nausea Vomiting This list may not describe all possible side effects. Call your doctor for medical advice about side effects. You may report side effects to FDA at 1-800-FDA-1088. Where should I keep my  medication? This medication is given in a hospital or clinic. It will not be stored at home. NOTE: This sheet is a summary. It may not cover all possible information. If you have questions about this medicine, talk to your doctor, pharmacist, or health care provider.  2023 Elsevier/Gold Standard (2022-02-17 00:00:00)       To help prevent nausea and vomiting after your treatment, we encourage you to take your nausea medication as directed.  BELOW ARE SYMPTOMS THAT SHOULD BE REPORTED IMMEDIATELY: *FEVER GREATER THAN 100.4 F (38 C) OR HIGHER *CHILLS OR SWEATING *NAUSEA AND VOMITING THAT IS NOT CONTROLLED WITH YOUR NAUSEA MEDICATION *UNUSUAL SHORTNESS OF BREATH *UNUSUAL BRUISING OR BLEEDING *URINARY PROBLEMS (pain or burning when urinating, or frequent urination) *BOWEL PROBLEMS (unusual diarrhea, constipation, pain near the anus) TENDERNESS IN MOUTH AND THROAT WITH OR WITHOUT PRESENCE OF ULCERS (sore throat, sores in mouth, or a toothache) UNUSUAL RASH, SWELLING OR PAIN  UNUSUAL VAGINAL DISCHARGE OR ITCHING   Items with * indicate a potential emergency and should be followed up as soon as possible or go to the Emergency Department if any problems should occur.  Please show the CHEMOTHERAPY ALERT CARD or IMMUNOTHERAPY ALERT CARD at check-in to the Emergency Department and triage nurse.  Should you have questions after your visit or need to cancel or reschedule your appointment, please contact [facility] [phone]  and follow the prompts.  Office hours are 8:00 a.m. to 4:30 p.m. Monday - Friday. Please note that voicemails left after 4:00 p.m. may not be returned until the following business day.  We are closed weekends and major holidays. You have access to a nurse at all times for urgent questions. Please call the main number to the clinic [phone] and follow the prompts.  For any non-urgent questions, you may also contact your provider using MyChart. We now  offer e-Visits for anyone 70 and older to request care online for non-urgent symptoms. For details visit [email or web address].   Also download the MyChart app! Go to the app store, search "MyChart", open the app, select [facility], and log in with your MyChart username and password.  Masks are optional in the cancer centers. If you would like for your care team to wear a mask while they are taking care of you, please let them know. You may have one support person who is at least 63 years old accompany you for your appointments.

## 2022-06-24 ENCOUNTER — Other Ambulatory Visit: Payer: Self-pay

## 2022-06-24 ENCOUNTER — Encounter: Payer: Self-pay | Admitting: Hematology

## 2022-06-24 ENCOUNTER — Inpatient Hospital Stay: Payer: No Typology Code available for payment source

## 2022-06-24 VITALS — BP 90/79 | HR 72 | Temp 98.3°F | Resp 18

## 2022-06-24 DIAGNOSIS — E860 Dehydration: Secondary | ICD-10-CM

## 2022-06-24 DIAGNOSIS — Z510 Encounter for antineoplastic radiation therapy: Secondary | ICD-10-CM | POA: Diagnosis not present

## 2022-06-24 MED ORDER — SODIUM CHLORIDE 0.9 % IV SOLN: INTRAVENOUS | Status: DC

## 2022-06-24 MED ORDER — SODIUM CHLORIDE 0.9% FLUSH
10.0000 mL | Freq: Once | INTRAVENOUS | Status: AC
  Administered 2022-06-24: 10 mL via INTRAVENOUS

## 2022-06-24 MED ORDER — HEPARIN SOD (PORK) LOCK FLUSH 10 UNIT/ML IV SOLN: 10.0000 [IU] | Freq: Once | INTRAVENOUS | Status: DC

## 2022-06-24 MED ORDER — HEPARIN SOD (PORK) LOCK FLUSH 10 UNIT/ML IV SOLN
10.0000 [IU] | Freq: Once | INTRAVENOUS | Status: AC
  Administered 2022-06-24: 10 [IU]

## 2022-06-24 NOTE — Patient Instructions (Signed)
Rehydration, Adult Rehydration is the replacement of body fluids, salts, and minerals (electrolytes) that are lost during dehydration. Dehydration is when there is not enough water or other fluids in the body. This happens when you lose more fluids than you take in. Common causes of dehydration include: Not drinking enough fluids. This can occur when you are ill or doing activities that require a lot of energy, especially in hot weather. Conditions that cause loss of water or other fluids, such as diarrhea, vomiting, sweating, or urinating a lot. Other illnesses, such as fever or infection. Certain medicines, such as those that remove excess fluid from the body (diuretics). Symptoms of mild or moderate dehydration may include thirst, dry lips and mouth, and dizziness. Symptoms of severe dehydration may include increased heart rate, confusion, fainting, and not urinating. For severe dehydration, you may need to get fluids through an IV at the hospital. For mild or moderate dehydration, you can usually rehydrate at home by drinking certain fluids as told by your health care provider. What are the risks? Generally, rehydration is safe. However, taking in too much fluid (overhydration) can be a problem. This is rare. Overhydration can cause an electrolyte imbalance, kidney failure, or a decrease in salt (sodium) levels in the body. Supplies needed You will need an oral rehydration solution (ORS) if your health care provider tells you to use one. This is a drink to treat dehydration. It can be found in pharmacies and retail stores. How to rehydrate Fluids Follow instructions from your health care provider for rehydration. The kind of fluid and the amount you should drink depend on your condition. In general, you should choose drinks that you prefer. If told by your health care provider, drink an ORS. Make an ORS by following instructions on the package. Start by drinking small amounts, about  cup (120  mL) every 5-10 minutes. Slowly increase how much you drink until you have taken the amount recommended by your health care provider. Drink enough clear fluids to keep your urine pale yellow. If you were told to drink an ORS, finish it first, then start slowly drinking other clear fluids. Drink fluids such as: Water. This includes sparkling water and flavored water. Drinking only water can lead to having too little sodium in your body (hyponatremia). Follow the advice of your health care provider. Water from ice chips you suck on. Fruit juice with water you add to it (diluted). Sports drinks. Hot or cold herbal teas. Broth-based soups. Milk or milk products. Food Follow instructions from your health care provider about what to eat while you rehydrate. Your health care provider may recommend that you slowly begin eating regular foods in small amounts. Eat foods that contain a healthy balance of electrolytes, such as bananas, oranges, potatoes, tomatoes, and spinach. Avoid foods that are greasy or contain a lot of sugar. In some cases, you may get nutrition through a feeding tube that is passed through your nose and into your stomach (nasogastric tube, or NG tube). This may be done if you have uncontrolled vomiting or diarrhea. Beverages to avoid  Certain beverages may make dehydration worse. While you rehydrate, avoid drinking alcohol. How to tell if you are recovering from dehydration You may be recovering from dehydration if: You are urinating more often than before you started rehydrating. Your urine is pale yellow. Your energy level improves. You vomit less frequently. You have diarrhea less frequently. Your appetite improves or returns to normal. You feel less dizzy or less light-headed.   Your skin tone and color start to look more normal. Follow these instructions at home: Take over-the-counter and prescription medicines only as told by your health care provider. Do not take sodium  tablets. Doing this can lead to having too much sodium in your body (hypernatremia). Contact a health care provider if: You continue to have symptoms of mild or moderate dehydration, such as: Thirst. Dry lips. Slightly dry mouth. Dizziness. Dark urine or less urine than normal. Muscle cramps. You continue to vomit or have diarrhea. Get help right away if you: Have symptoms of dehydration that get worse. Have a fever. Have a severe headache. Have been vomiting and the following happens: Your vomiting gets worse or does not go away. Your vomit includes blood or green matter (bile). You cannot eat or drink without vomiting. Have problems with urination or bowel movements, such as: Diarrhea that gets worse or does not go away. Blood in your stool (feces). This may cause stool to look black and tarry. Not urinating, or urinating only a small amount of very dark urine, within 6-8 hours. Have trouble breathing. Have symptoms that get worse with treatment. These symptoms may represent a serious problem that is an emergency. Do not wait to see if the symptoms will go away. Get medical help right away. Call your local emergency services (911 in the [place] Do not drive yourself to the hospital. Summary Rehydration is the replacement of body fluids and minerals (electrolytes) that are lost during dehydration. Follow instructions from your health care provider for rehydration. The kind of fluid and amount you should drink depend on your condition. Slowly increase how much you drink until you have taken the amount recommended by your health care provider. Contact your health care provider if you continue to show signs of mild or moderate dehydration. This information is not intended to replace advice given to you by your health care provider. Make sure you discuss any questions you have with your health care provider. Document Revised: 12/04/2019 Document Reviewed: 10/14/2019 Elsevier Patient  Education  2023 [employer]  

## 2022-06-24 NOTE — Progress Notes (Signed)
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  PRIOR THERAPY: none  NGS Results: not done  CURRENT THERAPY: Chemoradiation therapy with weekly carboplatin and paclitaxel  BRIEF ONCOLOGIC HISTORY:  Oncology History  GE junction carcinoma ([place])  04/12/2022 Initial Diagnosis   GE junction carcinoma ([place])   05/12/2022 -  Chemotherapy   Patient is on Treatment Plan : ESOPHAGUS Carboplatin/PACLitaxel weekly x 6 weeks with XRT         CANCER STAGING:  Cancer Staging  GE junction carcinoma ([place]) Staging form: Esophagus - Adenocarcinoma, AJCC 8th Edition - Clinical stage from 04/12/2022: Stage III (cT3, cN0, cM0, G3) - Unsigned   INTERVAL HISTORY:  [name] [name], a 61 y.o. male, seen for follow-up of GE junction adenocarcinoma.  He has completed radiation therapy last dose today.  We have held his treatment last week due to thrombocytopenia.  He reports some dysphagia and uses lidocaine which is helping.  He lost about 2 to 3 pounds.  Has occasional dizziness on standing.  Also developed left foot numbness on and off since last week.  No burning sensation or pain.  REVIEW OF SYSTEMS:  Review of Systems  HENT:   Positive for trouble swallowing.   Respiratory:  Positive for shortness of breath.   Neurological:  Positive for dizziness and numbness (Left toes on and off).  All other systems reviewed and are negative.   PAST MEDICAL/SURGICAL HISTORY:  Past Medical History:  Diagnosis Date   Port-A-Cath in place 05/11/2022   Past Surgical History:  Procedure Laterality Date   BIOPSY  03/25/2022   Procedure: BIOPSY;  Surgeon: Harvel Quale, MD;  [facility];  Service: Gastroenterology;;  GE junction;   BIOPSY  04/25/2022    Procedure: BIOPSY;  Surgeon: Irving Copas., MD;  Location: [facility];  Service: Gastroenterology;;   COLONOSCOPY     COLONOSCOPY WITH PROPOFOL N/A 03/25/2022   Procedure: COLONOSCOPY WITH PROPOFOL;  Surgeon: Harvel Quale, MD;  Location: [facility];  Service: Gastroenterology;  Laterality: N/A;  945 ASA 1   ESOPHAGOGASTRODUODENOSCOPY (EGD) WITH PROPOFOL N/A 03/25/2022   Procedure: ESOPHAGOGASTRODUODENOSCOPY (EGD) WITH PROPOFOL;  Surgeon: Harvel Quale, MD;  [facility];  Service: Gastroenterology;  Laterality: N/A;   ESOPHAGOGASTRODUODENOSCOPY (EGD) WITH PROPOFOL N/A 04/25/2022   Procedure: ESOPHAGOGASTRODUODENOSCOPY (EGD) WITH PROPOFOL;  Surgeon: Rush Landmark Telford Nab., MD;  Location: [facility];  Service: Gastroenterology;  Laterality: N/A;   IR IMAGING GUIDED PORT INSERTION  05/04/2022   UPPER ESOPHAGEAL ENDOSCOPIC ULTRASOUND (EUS) N/A 04/25/2022   Procedure: UPPER ESOPHAGEAL ENDOSCOPIC ULTRASOUND (EUS);  Surgeon: Irving Copas., MD;  Location: [facility];  Service: Gastroenterology;  Laterality: N/A;    SOCIAL HISTORY:  Social History   Socioeconomic History   Marital status: Married    Spouse name: Not on file   Number of children: Not on file   Years of education: Not on file   Highest education level: Not on file  Occupational History   Not on file  Tobacco Use   Smoking status: Never    Passive exposure: Past   Smokeless tobacco: Never  Vaping Use   Vaping Use: Never used  Substance and Sexual Activity   Alcohol use: Yes    Comment: 1 daily  Drug use: Never   Sexual activity: Yes  Other Topics Concern   Not on file  Social History Narrative   Not on file   Social Determinants of Health   Financial Resource Strain: Not on file  Food Insecurity: Not on file  Transportation Needs: Not on file  Physical Activity: Not on file  Stress: Not on file  Social Connections: Not on file  Intimate Partner Violence:  Not on file    FAMILY HISTORY:  Family History  Problem Relation Age of Onset   Cancer Maternal Grandmother        Breast cancer   Cancer Other        Maternal Cousin - breast cancer    CURRENT MEDICATIONS:  Current Outpatient Medications  Medication Sig Dispense Refill   CARBOPLATIN IV Inject into the vein once a week.     lidocaine (XYLOCAINE) 2 % solution Use as directed 15 mLs in the mouth or throat every 4 (four) hours as needed for mouth pain. 100 mL 5   omeprazole (PRILOSEC) 40 MG capsule Take 40 mg by mouth 2 (two) times daily.     PACLitaxel (TAXOL IV) Inject into the vein once a week.     sucralfate (CARAFATE) 1 g tablet Take 1 tablet (1 g total) by mouth 4 (four) times daily -  with meals and at bedtime. Crush and dissolve in 10 mL of warm water prior to swallowing, take 20 minutes before meals. 120 tablet 1   lidocaine-prilocaine (EMLA) cream Apply a small amount to port a cath site and cover with plastic wrap 1 hour prior to infusion appointments (Patient not taking: Reported on 06/23/2022) 30 g 3   prochlorperazine (COMPAZINE) 10 MG tablet Take 1 tablet (10 mg total) by mouth every 6 (six) hours as needed (Nausea or vomiting). (Patient not taking: Reported on 06/23/2022) 60 tablet 3   No current facility-administered medications for this visit.   Facility-Administered Medications Ordered in Other Visits  Medication Dose Route Frequency Provider Last Rate Last Admin   0.9 %  sodium chloride infusion   Intravenous Continuous Derek Jack, MD       0.9 %  sodium chloride infusion   Intravenous Continuous Derek Jack, MD 500 mL/hr at 06/24/22 0814 New Bag at 06/24/22 0814    ALLERGIES:  No Known Allergies  PHYSICAL EXAM:  Performance status (ECOG): 0 - Asymptomatic  Vitals:   06/23/22 0928  BP: (!) 88/63  Pulse: (!) 122  Resp: 16  Temp: 98.2 F (36.8 C)  SpO2: 98%   Wt Readings from Last 3 Encounters:  06/23/22 190 lb 8 oz (86.4 kg)  06/16/22 193  lb 8 oz (87.8 kg)  06/09/22 192 lb 14.4 oz (87.5 kg)   Physical Exam Vitals reviewed.  Constitutional:      Appearance: Normal appearance.  Cardiovascular:     Rate and Rhythm: Normal rate and regular rhythm.     Pulses: Normal pulses.     Heart sounds: Normal heart sounds.  Pulmonary:     Effort: Pulmonary effort is normal.     Breath sounds: Normal breath sounds.  Neurological:     General: No focal deficit present.     Mental Status: He is alert and oriented to person, place, and time.  Psychiatric:        Mood and Affect: Mood normal.        Behavior: Behavior normal.     LABORATORY DATA:  I have reviewed the labs as  listed.     Latest Ref Rng & Units 06/23/2022    9:12 AM 06/16/2022    9:07 AM 06/09/2022    9:00 AM  CBC  WBC 4.0 - 10.5 K/uL 4.6  1.7  2.9   Hemoglobin 13.0 - 17.0 g/dL 10.0  8.9  11.2   Hematocrit 39.0 - 52.0 % 28.7  25.4  32.2   Platelets 150 - 400 K/uL 82  50  90       Latest Ref Rng & Units 06/23/2022    9:12 AM 06/16/2022    9:07 AM 06/09/2022    9:00 AM  CMP  Glucose 70 - 99 mg/dL 110  162  113   BUN 8 - 23 mg/dL '11  14  15   '$ Creatinine 0.61 - 1.24 mg/dL 0.73  0.78  0.93   Sodium 135 - 145 mmol/L 137  138  137   Potassium 3.5 - 5.1 mmol/L 3.9  3.8  4.2   Chloride 98 - 111 mmol/L 106  107  104   CO2 22 - 32 mmol/L '26  27  26   '$ Calcium 8.9 - 10.3 mg/dL 9.5  9.5  9.7   Total Protein 6.5 - 8.1 g/dL 6.4  5.9  6.6   Total Bilirubin 0.3 - 1.2 mg/dL 1.8  1.5  1.8   Alkaline Phos 38 - 126 U/L 52  44  47   AST 15 - 41 U/L '16  19  18   '$ ALT 0 - 44 U/L '11  14  14     '$ DIAGNOSTIC IMAGING:  I have independently reviewed the scans and discussed with the patient. No results found.   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  He lost about 60 pounds since September, was trying to lose weight by walking 7 miles a day. - EGD  (03/25/2022): Large ulcerating mass with no bleeding found at the GE junction, 40-42 cm from incisors.  Mass appeared to extend 1 cm distal towards the cardia.  Partially obstructing and circumferential.  Normal stomach and duodenum. 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 - CT CAP (03/30/2022): Thickened folds of the GE junction without discrete focal mass.  No upstream dilatation.  No intrathoracic, axillary or abdominal adenopathy.  5 mm right upper lobe solitary nodule.  2.4 cm densely calcified right thyroid nodule.  Mild hepatic steatosis. - PET CT scan (04/07/2022): Hypermetabolic area at the GE junction SUV 6.93.  No signs of adenopathy or suspicious lung nodules.  No sign of other metastatic disease.  Left thyroid lesion with moderately increased metabolic activity, SUV 7.71 - EGD/EUS (04/25/2022): Malignant esophageal tumor at distal esophagus extending into GE junction and into the cardia.  EUS staging T3N0.  No malignant appearing lymph nodes visualized.  Mass extends from 37 cm to 42 cm (esophageal portion is 37-40 cm).  Lesion was circumferential.  Sonographic evidence suggesting invasion into 1 through the muscularis propria.  Intact interface seen between the mass and adjacent structures. - Dr. Kipp Brood recommended surgery between 6 to 10 weeks after completion of chemoradiation with repeat PET scan in 4 to 6 weeks after last dose of radiation. - Concurrent chemoradiation with carboplatin and paclitaxel followed by surgery and possibly adjuvant nivolumab for 1 year (Checkmate 577) if no complete pathological response is achieved. - Concurrent chemoradiation therapy started on 05/12/2022.  Last dose on  06/23/2022.    Social/family history: - Lives at home with his wife.  He is a retired Electronics engineer at [employer].  Non-smoker.  Drinks alcohol 5 days/week, moderate. - Maternal grandmother had breast cancer.  Maternal cousin had  metastatic breast cancer.  2.  Incidental PET positive left thyroid nodule: - Biopsy of the right thyroid nodule on 02/17/85, scant follicular epithelium, Bethesda category 1. - He will need work-up for left thyroid nodule after completion of treatments.   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 - We have given him G-CSF yesterday. - We reviewed his labs today.  White count increased to 4.6 with normal ANC.  Platelet count today is 82.  Hemoglobin is 10.0.  LFTs show slightly elevated total bilirubin 1.8 stable.  Other LFTs are normal.  Creatinine and electrolytes are normal. - He will proceed with last weekly dose of carboplatin and paclitaxel today.  He has completed radiation therapy today. - He has PET scan scheduled on 07/21/2022 followed by appointments with Dr. Kipp Brood and Dr. Sondra Come. - I will see him back 4 weeks after surgery.  2.  Weight loss: - He has lost 20+ pounds since the start of therapy.  He lost about 2 to 3 pounds since last visit.  Continue to use lidocaine prior to eating.  3.  Dizziness: - Blood pressure today is 88/63.  He will receive IV fluids.  He was told to increase his fluid intake.  4.  Left foot drop: - He has left foot drop for couple of weeks.  We have made a referral for a splint at last visit.  He has not picked it up yet. - He had developed on and off numbness in the left toes for the past week.  They are not hurting.  Will monitor at this time.   Orders placed this encounter:  No orders of the defined types were placed in this encounter.    Derek Jack, MD [place] [phone]

## 2022-06-27 ENCOUNTER — Other Ambulatory Visit: Payer: Self-pay

## 2022-06-27 ENCOUNTER — Telehealth: Payer: Self-pay | Admitting: Oncology

## 2022-06-27 ENCOUNTER — Inpatient Hospital Stay: Payer: No Typology Code available for payment source

## 2022-06-27 ENCOUNTER — Other Ambulatory Visit: Payer: Self-pay | Admitting: Oncology

## 2022-06-27 DIAGNOSIS — R1319 Other dysphagia: Secondary | ICD-10-CM

## 2022-06-27 DIAGNOSIS — C160 Malignant neoplasm of cardia: Secondary | ICD-10-CM

## 2022-06-27 DIAGNOSIS — Z510 Encounter for antineoplastic radiation therapy: Secondary | ICD-10-CM | POA: Diagnosis not present

## 2022-06-27 MED ORDER — HEPARIN SOD (PORK) LOCK FLUSH 100 UNIT/ML IV SOLN
500.0000 [IU] | Freq: Once | INTRAVENOUS | Status: AC
  Administered 2022-06-27: 500 [IU] via INTRAVENOUS

## 2022-06-27 MED ORDER — SODIUM CHLORIDE 0.9 % IV SOLN: INTRAVENOUS | Status: AC

## 2022-06-27 MED ORDER — SODIUM CHLORIDE 0.9% FLUSH
10.0000 mL | Freq: Once | INTRAVENOUS | Status: AC
  Administered 2022-06-27: 10 mL via INTRAVENOUS

## 2022-06-27 NOTE — Patient Instructions (Signed)
Rehydration, Adult Rehydration is the replacement of body fluids, salts, and minerals (electrolytes) that are lost during dehydration. Dehydration is when there is not enough water or other fluids in the body. This happens when you lose more fluids than you take in. Common causes of dehydration include: Not drinking enough fluids. This can occur when you are ill or doing activities that require a lot of energy, especially in hot weather. Conditions that cause loss of water or other fluids, such as diarrhea, vomiting, sweating, or urinating a lot. Other illnesses, such as fever or infection. Certain medicines, such as those that remove excess fluid from the body (diuretics). Symptoms of mild or moderate dehydration may include thirst, dry lips and mouth, and dizziness. Symptoms of severe dehydration may include increased heart rate, confusion, fainting, and not urinating. For severe dehydration, you may need to get fluids through an IV at the hospital. For mild or moderate dehydration, you can usually rehydrate at home by drinking certain fluids as told by your health care provider. What are the risks? Generally, rehydration is safe. However, taking in too much fluid (overhydration) can be a problem. This is rare. Overhydration can cause an electrolyte imbalance, kidney failure, or a decrease in salt (sodium) levels in the body. Supplies needed You will need an oral rehydration solution (ORS) if your health care provider tells you to use one. This is a drink to treat dehydration. It can be found in pharmacies and retail stores. How to rehydrate Fluids Follow instructions from your health care provider for rehydration. The kind of fluid and the amount you should drink depend on your condition. In general, you should choose drinks that you prefer. If told by your health care provider, drink an ORS. Make an ORS by following instructions on the package. Start by drinking small amounts, about  cup (120  mL) every 5-10 minutes. Slowly increase how much you drink until you have taken the amount recommended by your health care provider. Drink enough clear fluids to keep your urine pale yellow. If you were told to drink an ORS, finish it first, then start slowly drinking other clear fluids. Drink fluids such as: Water. This includes sparkling water and flavored water. Drinking only water can lead to having too little sodium in your body (hyponatremia). Follow the advice of your health care provider. Water from ice chips you suck on. Fruit juice with water you add to it (diluted). Sports drinks. Hot or cold herbal teas. Broth-based soups. Milk or milk products. Food Follow instructions from your health care provider about what to eat while you rehydrate. Your health care provider may recommend that you slowly begin eating regular foods in small amounts. Eat foods that contain a healthy balance of electrolytes, such as bananas, oranges, potatoes, tomatoes, and spinach. Avoid foods that are greasy or contain a lot of sugar. In some cases, you may get nutrition through a feeding tube that is passed through your nose and into your stomach (nasogastric tube, or NG tube). This may be done if you have uncontrolled vomiting or diarrhea. Beverages to avoid  Certain beverages may make dehydration worse. While you rehydrate, avoid drinking alcohol. How to tell if you are recovering from dehydration You may be recovering from dehydration if: You are urinating more often than before you started rehydrating. Your urine is pale yellow. Your energy level improves. You vomit less frequently. You have diarrhea less frequently. Your appetite improves or returns to normal. You feel less dizzy or less light-headed.   Your skin tone and color start to look more normal. Follow these instructions at home: Take over-the-counter and prescription medicines only as told by your health care provider. Do not take sodium  tablets. Doing this can lead to having too much sodium in your body (hypernatremia). Contact a health care provider if: You continue to have symptoms of mild or moderate dehydration, such as: Thirst. Dry lips. Slightly dry mouth. Dizziness. Dark urine or less urine than normal. Muscle cramps. You continue to vomit or have diarrhea. Get help right away if you: Have symptoms of dehydration that get worse. Have a fever. Have a severe headache. Have been vomiting and the following happens: Your vomiting gets worse or does not go away. Your vomit includes blood or green matter (bile). You cannot eat or drink without vomiting. Have problems with urination or bowel movements, such as: Diarrhea that gets worse or does not go away. Blood in your stool (feces). This may cause stool to look black and tarry. Not urinating, or urinating only a small amount of very dark urine, within 6-8 hours. Have trouble breathing. Have symptoms that get worse with treatment. These symptoms may represent a serious problem that is an emergency. Do not wait to see if the symptoms will go away. Get medical help right away. Call your local emergency services (911 in the [place] Do not drive yourself to the hospital. Summary Rehydration is the replacement of body fluids and minerals (electrolytes) that are lost during dehydration. Follow instructions from your health care provider for rehydration. The kind of fluid and amount you should drink depend on your condition. Slowly increase how much you drink until you have taken the amount recommended by your health care provider. Contact your health care provider if you continue to show signs of mild or moderate dehydration. This information is not intended to replace advice given to you by your health care provider. Make sure you discuss any questions you have with your health care provider. Document Revised: 12/04/2019 Document Reviewed: 10/14/2019 Elsevier Patient  Education  2023 [employer]  

## 2022-06-27 NOTE — Telephone Encounter (Addendum)
Don left a message requesting fluids today.  He is feeling lightheaded.  Notified Dr. Sondra Come and arranged for infusion today at 2:00. Called him back and advised him of infusion appointment.  He verbalized understanding and agreement.

## 2022-06-27 NOTE — Progress Notes (Signed)
Order placed sign and held for normal saline infusion.

## 2022-06-28 ENCOUNTER — Telehealth: Payer: Self-pay | Admitting: Oncology

## 2022-06-28 ENCOUNTER — Other Ambulatory Visit: Payer: Self-pay | Admitting: Oncology

## 2022-06-28 DIAGNOSIS — C160 Malignant neoplasm of cardia: Secondary | ICD-10-CM

## 2022-06-28 NOTE — Telephone Encounter (Signed)
[name] [name] called and is still feeling a little lightheaded.  He is wondering if he can get fluids on Wednesday and Friday.

## 2022-06-28 NOTE — Progress Notes (Signed)
Orders placed per Dr. Sondra Come sign and held for infusion on 06/30/22 and 07/01/22.

## 2022-06-30 ENCOUNTER — Inpatient Hospital Stay: Payer: No Typology Code available for payment source

## 2022-06-30 ENCOUNTER — Other Ambulatory Visit: Payer: Self-pay

## 2022-06-30 DIAGNOSIS — Z510 Encounter for antineoplastic radiation therapy: Secondary | ICD-10-CM | POA: Diagnosis not present

## 2022-06-30 DIAGNOSIS — C160 Malignant neoplasm of cardia: Secondary | ICD-10-CM

## 2022-06-30 MED ORDER — SODIUM CHLORIDE 0.9 % IV SOLN: INTRAVENOUS | Status: DC

## 2022-07-01 ENCOUNTER — Ambulatory Visit (INDEPENDENT_AMBULATORY_CARE_PROVIDER_SITE_OTHER): Payer: No Typology Code available for payment source | Admitting: Thoracic Surgery (Cardiothoracic Vascular Surgery)

## 2022-07-01 ENCOUNTER — Other Ambulatory Visit: Payer: Self-pay

## 2022-07-01 ENCOUNTER — Inpatient Hospital Stay: Payer: No Typology Code available for payment source

## 2022-07-01 DIAGNOSIS — Z510 Encounter for antineoplastic radiation therapy: Secondary | ICD-10-CM | POA: Diagnosis not present

## 2022-07-01 DIAGNOSIS — C160 Malignant neoplasm of cardia: Secondary | ICD-10-CM

## 2022-07-01 MED ORDER — SODIUM CHLORIDE 0.9 % IV SOLN: INTRAVENOUS | Status: AC

## 2022-07-01 MED ORDER — HEPARIN SOD (PORK) LOCK FLUSH 100 UNIT/ML IV SOLN
500.0000 [IU] | Freq: Once | INTRAVENOUS | Status: AC
  Administered 2022-07-01: 500 [IU] via INTRAVENOUS

## 2022-07-01 MED ORDER — SODIUM CHLORIDE 0.9% FLUSH
10.0000 mL | Freq: Once | INTRAVENOUS | Status: AC
  Administered 2022-07-01: 10 mL via INTRAVENOUS

## 2022-07-01 NOTE — Progress Notes (Signed)
     [address]       [place] [address]             [phone]        Patient: Home Provider: Office Consent for Telemedicine visit obtained.   Today's visit was completed via a real-time telehealth (see specific modality noted below). The patient/authorized person provided oral consent at the time of the visit to engage in a telemedicine encounter with the present provider at [facility]. The patient/authorized person was informed of the potential benefits, limitations, and risks of telemedicine. The patient/authorized person expressed understanding that the laws that protect confidentiality also apply to telemedicine. The patient/authorized person acknowledged understanding that telemedicine does not provide emergency services and that he or she would need to call 911 or proceed to the nearest hospital for help if such a need arose.   Total time spent in the clinical discussion 10 minutes.  Telehealth Modality: Phone visit (audio only)  I had a telephone visit with [name] [name].  He has some dysphagia, and was prescribed sucrafate.  He weight is 190lbs which is down 25lbs since starting therapy.     Hx: GE junction poorly differentiated cancer.   Radiation completion date: 06/22/2022 Operative Window: Late October, early Nov

## 2022-07-02 ENCOUNTER — Inpatient Hospital Stay: Payer: No Typology Code available for payment source

## 2022-07-02 DIAGNOSIS — Z510 Encounter for antineoplastic radiation therapy: Secondary | ICD-10-CM | POA: Diagnosis not present

## 2022-07-02 DIAGNOSIS — C160 Malignant neoplasm of cardia: Secondary | ICD-10-CM

## 2022-07-02 MED ORDER — SODIUM CHLORIDE 0.9 % IV SOLN: INTRAVENOUS | Status: AC

## 2022-07-02 NOTE — Patient Instructions (Signed)
Rehydration, Adult Rehydration is the replacement of body fluids, salts, and minerals (electrolytes) that are lost during dehydration. Dehydration is when there is not enough water or other fluids in the body. This happens when you lose more fluids than you take in. Common causes of dehydration include: Not drinking enough fluids. This can occur when you are ill or doing activities that require a lot of energy, especially in hot weather. Conditions that cause loss of water or other fluids, such as diarrhea, vomiting, sweating, or urinating a lot. Other illnesses, such as fever or infection. Certain medicines, such as those that remove excess fluid from the body (diuretics). Symptoms of mild or moderate dehydration may include thirst, dry lips and mouth, and dizziness. Symptoms of severe dehydration may include increased heart rate, confusion, fainting, and not urinating. For severe dehydration, you may need to get fluids through an IV at the hospital. For mild or moderate dehydration, you can usually rehydrate at home by drinking certain fluids as told by your health care provider. What are the risks? Generally, rehydration is safe. However, taking in too much fluid (overhydration) can be a problem. This is rare. Overhydration can cause an electrolyte imbalance, kidney failure, or a decrease in salt (sodium) levels in the body. Supplies needed You will need an oral rehydration solution (ORS) if your health care provider tells you to use one. This is a drink to treat dehydration. It can be found in pharmacies and retail stores. How to rehydrate Fluids Follow instructions from your health care provider for rehydration. The kind of fluid and the amount you should drink depend on your condition. In general, you should choose drinks that you prefer. If told by your health care provider, drink an ORS. Make an ORS by following instructions on the package. Start by drinking small amounts, about  cup (120  mL) every 5-10 minutes. Slowly increase how much you drink until you have taken the amount recommended by your health care provider. Drink enough clear fluids to keep your urine pale yellow. If you were told to drink an ORS, finish it first, then start slowly drinking other clear fluids. Drink fluids such as: Water. This includes sparkling water and flavored water. Drinking only water can lead to having too little sodium in your body (hyponatremia). Follow the advice of your health care provider. Water from ice chips you suck on. Fruit juice with water you add to it (diluted). Sports drinks. Hot or cold herbal teas. Broth-based soups. Milk or milk products. Food Follow instructions from your health care provider about what to eat while you rehydrate. Your health care provider may recommend that you slowly begin eating regular foods in small amounts. Eat foods that contain a healthy balance of electrolytes, such as bananas, oranges, potatoes, tomatoes, and spinach. Avoid foods that are greasy or contain a lot of sugar. In some cases, you may get nutrition through a feeding tube that is passed through your nose and into your stomach (nasogastric tube, or NG tube). This may be done if you have uncontrolled vomiting or diarrhea. Beverages to avoid  Certain beverages may make dehydration worse. While you rehydrate, avoid drinking alcohol. How to tell if you are recovering from dehydration You may be recovering from dehydration if: You are urinating more often than before you started rehydrating. Your urine is pale yellow. Your energy level improves. You vomit less frequently. You have diarrhea less frequently. Your appetite improves or returns to normal. You feel less dizzy or less light-headed.   Your skin tone and color start to look more normal. Follow these instructions at home: Take over-the-counter and prescription medicines only as told by your health care provider. Do not take sodium  tablets. Doing this can lead to having too much sodium in your body (hypernatremia). Contact a health care provider if: You continue to have symptoms of mild or moderate dehydration, such as: Thirst. Dry lips. Slightly dry mouth. Dizziness. Dark urine or less urine than normal. Muscle cramps. You continue to vomit or have diarrhea. Get help right away if you: Have symptoms of dehydration that get worse. Have a fever. Have a severe headache. Have been vomiting and the following happens: Your vomiting gets worse or does not go away. Your vomit includes blood or green matter (bile). You cannot eat or drink without vomiting. Have problems with urination or bowel movements, such as: Diarrhea that gets worse or does not go away. Blood in your stool (feces). This may cause stool to look black and tarry. Not urinating, or urinating only a small amount of very dark urine, within 6-8 hours. Have trouble breathing. Have symptoms that get worse with treatment. These symptoms may represent a serious problem that is an emergency. Do not wait to see if the symptoms will go away. Get medical help right away. Call your local emergency services (911 in the [place] Do not drive yourself to the hospital. Summary Rehydration is the replacement of body fluids and minerals (electrolytes) that are lost during dehydration. Follow instructions from your health care provider for rehydration. The kind of fluid and amount you should drink depend on your condition. Slowly increase how much you drink until you have taken the amount recommended by your health care provider. Contact your health care provider if you continue to show signs of mild or moderate dehydration. This information is not intended to replace advice given to you by your health care provider. Make sure you discuss any questions you have with your health care provider. Document Revised: 12/04/2019 Document Reviewed: 10/14/2019 Elsevier Patient  Education  2023 [employer]  

## 2022-07-04 ENCOUNTER — Telehealth: Payer: Self-pay | Admitting: Oncology

## 2022-07-04 DIAGNOSIS — C160 Malignant neoplasm of cardia: Secondary | ICD-10-CM

## 2022-07-04 NOTE — Telephone Encounter (Addendum)
[name] [name] left a message asking if he can have fluids scheduled a couple of times this week and also if he can schedule an appointment with Dr. Sondra Come. The lightheadedness he is experiencing is not going away as quickly as he thought it would.  Called [name] [name] back and scheduled labs at 9:30 and follow up with Dr. Sondra Come at 10:00 tomorrow.  He said he is able to eat and drink much better now.

## 2022-07-04 NOTE — Progress Notes (Incomplete)
  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   Poorly differentiated gastric adenocarcinoma of the GE junction, with signet ring cell features   Cancer Staging  GE junction carcinoma ([place]) Staging form: Esophagus - Adenocarcinoma, AJCC 8th Edition - Clinical stage from 04/12/2022: Stage III (cT3, cN0, cM0, G3) - Unsigned  Intent: Curative  Radiation Treatment Dates: 05/12/2022 through 06/22/2022 Site Technique Total Dose (Gy) Dose per Fx (Gy) Completed Fx Beam Energies  Esophagus: Esoph IMRT 45/45 1.8 25/25 6X  Esophagus: Esoph_Bst IMRT 5.4/5.4 1.8 3/3 6X   Narrative: The patient tolerated radiation therapy relatively well. During his final weekly treatment check on 06/21/22, the patient reported mild pain/discomfort only with swallowing (improved slowly with chemo and in between radiation treatments), and increased fatigue. He denied any other symptoms. Patient was also found to be tachycardic during this visit but with regular rhythm.  Plan: The patient will follow-up with radiation oncology in one month .  ________________________________________________ -----------------------------------  Blair Promise, PhD, MD  This document serves as a record of services personally performed by Gery Pray, MD. It was created on his behalf by Roney Mans, a trained medical scribe. The creation of this record is based on the scribe's personal observations and the provider's statements to them. This document has been checked and approved by the attending provider.

## 2022-07-04 NOTE — Progress Notes (Signed)
Radiation Oncology         [phone] ________________________________  Name: [name] [name] MRN: [id number]  Date: 07/05/2022  DOB: 02-03-1961  Follow-Up Visit Note  CC: Derek Jack, MD  Derek Jack, MD  No diagnosis found.  Diagnosis: The encounter diagnosis was GE junction carcinoma ([place]).   Poorly differentiated gastric adenocarcinoma of the GE junction, with signet ring cell features   Cancer Staging  GE junction carcinoma ([place]) Staging form: Esophagus - Adenocarcinoma, AJCC 8th Edition - Clinical stage from 04/12/2022: Stage III (cT3, cN0, cM0, G3) - Unsigned  Interval Since Last Radiation: 13 days   Intent: Curative  Radiation Treatment Dates: 05/12/2022 through 06/22/2022 Site Technique Total Dose (Gy) Dose per Fx (Gy) Completed Fx Beam Energies  Esophagus: Esoph IMRT 45/45 1.8 25/25 6X  Esophagus: Esoph_Bst IMRT 5.4/5.4 1.8 3/3 6X    Narrative:  The patient returns today for evaluation of lightheadedness s/p concurrent chemoradiation. The patient called [place] yesterday stating that his lightheadedness was not going away as quickly as he though it would. He also requested scheduling for IV fluids for several times this week.   The patient tolerated radiation therapy relatively well. During his final weekly treatment check on 06/21/22, the patient reported mild pain/discomfort only with swallowing (improved slowly with chemo and in between radiation treatments), and increased fatigue. He denied any other symptoms. Patient was also found to be tachycardic during this visit but with regular rhythm.  To review, the patient began chemotherapy consisting of Taxol and Carboplatin concurrent with radiation on 05/12/22.    Since completing radiation, the patient followed up with Dr. Delton Coombes on 06/23/22. During which time, the patient reported some dysphagia (using lidocaine which helped), a 2 to 3 pound weight loss since his last visit, occasional dizziness on  standing, and new onset left foot numbness on and off since the prior week.  The patient's systemic treatment ( carboplatin and paclitaxel ) was also held the week prior due to thrombocytopenia and neutropenia. Labs reviewed during this visit showed: white count of 4.6 with normal ANC (increased), platelet count of 82, Hg of 10.0, and slightly elevated LFTs total bilirubin at 1.8 (stable). The patient proceeded with his last weekly dose of carboplatin and paclitaxel during this visit. BP taken during this visit was 88/63 and the patient received IV fluids.   The patient also reported dysphagia to Dr. Kipp Brood via telephone visit on 07/01/22 and was prescribed sucrafate.  Patient is scheduled for restaging PET scan on 07/21/22 and will follow up with Dr. Kipp Brood following this for further discussion of surgery. (To review, Dr. Kipp Brood had recommended surgery approximately 6 - 10 weeks after completion of chemoradiation).    Allergies:  has No Known Allergies.  Meds: Current Outpatient Medications  Medication Sig Dispense Refill   CARBOPLATIN IV Inject into the vein once a week.     lidocaine (XYLOCAINE) 2 % solution Use as directed 15 mLs in the mouth or throat every 4 (four) hours as needed for mouth pain. 100 mL 5   lidocaine-prilocaine (EMLA) cream Apply a small amount to port a cath site and cover with plastic wrap 1 hour prior to infusion appointments (Patient not taking: Reported on 06/23/2022) 30 g 3   omeprazole (PRILOSEC) 40 MG capsule Take 40 mg by mouth 2 (two) times daily.     PACLitaxel (TAXOL IV) Inject into the vein once a week.     prochlorperazine (COMPAZINE) 10 MG tablet Take 1 tablet (10 mg total)  by mouth every 6 (six) hours as needed (Nausea or vomiting). (Patient not taking: Reported on 06/23/2022) 60 tablet 3   sucralfate (CARAFATE) 1 g tablet Take 1 tablet (1 g total) by mouth 4 (four) times daily -  with meals and at bedtime. Crush and dissolve in 10 mL of warm water prior  to swallowing, take 20 minutes before meals. 120 tablet 1   No current facility-administered medications for this encounter.    Physical Findings: The patient is in no acute distress. Patient is alert and oriented.  vitals were not taken for this visit. .  No significant changes. Lungs are clear to auscultation bilaterally. Heart has regular rate and rhythm. No palpable cervical, supraclavicular, or axillary adenopathy. Abdomen soft, non-tender, normal bowel sounds.   Lab Findings: Lab Results  Component Value Date   WBC 4.6 06/23/2022   HGB 10.0 (L) 06/23/2022   HCT 28.7 (L) 06/23/2022   MCV 92.9 06/23/2022   PLT 82 (L) 06/23/2022    Radiographic Findings: No results found.  Impression:  The encounter diagnosis was GE junction carcinoma ([place]).   Poorly differentiated gastric adenocarcinoma of the GE junction, with signet ring cell features  The patient is recovering from the effects of radiation.  ***  Plan:  ***   *** minutes of total time was spent for this patient encounter, including preparation, face-to-face counseling with the patient and coordination of care, physical exam, and documentation of the encounter. ____________________________________  Blair Promise, PhD, MD  This document serves as a record of services personally performed by Gery Pray, MD. It was created on his behalf by Roney Mans, a trained medical scribe. The creation of this record is based on the scribe's personal observations and the provider's statements to them. This document has been checked and approved by the attending provider.

## 2022-07-05 ENCOUNTER — Other Ambulatory Visit: Payer: Self-pay

## 2022-07-05 ENCOUNTER — Inpatient Hospital Stay: Payer: No Typology Code available for payment source

## 2022-07-05 ENCOUNTER — Other Ambulatory Visit: Payer: Self-pay | Admitting: Internal Medicine

## 2022-07-05 ENCOUNTER — Ambulatory Visit
Admission: RE | Admit: 2022-07-05 | Discharge: 2022-07-05 | Disposition: A | Discharge status: Home / Self Care | Payer: No Typology Code available for payment source | Source: Ambulatory Visit | Attending: Radiation Oncology | Admitting: Radiation Oncology

## 2022-07-05 VITALS — BP 104/74 | HR 102

## 2022-07-05 VITALS — BP 82/53 | HR 146 | Temp 97.6°F | Resp 18 | Ht 70.0 in | Wt 182.8 lb

## 2022-07-05 DIAGNOSIS — T451X5A Adverse effect of antineoplastic and immunosuppressive drugs, initial encounter: Secondary | ICD-10-CM

## 2022-07-05 DIAGNOSIS — Z510 Encounter for antineoplastic radiation therapy: Secondary | ICD-10-CM | POA: Diagnosis not present

## 2022-07-05 DIAGNOSIS — C160 Malignant neoplasm of cardia: Secondary | ICD-10-CM

## 2022-07-05 LAB — CMP (CANCER CENTER ONLY)
ALT: 9 U/L (ref 0–44)
AST: 14 U/L — ABNORMAL LOW (ref 15–41)
Albumin: 3.8 g/dL (ref 3.5–5.0)
Alkaline Phosphatase: 54 U/L (ref 38–126)
Anion gap: 8 (ref 5–15)
BUN: 14 mg/dL (ref 8–23)
CO2: 28 mmol/L (ref 22–32)
Calcium: 9.6 mg/dL (ref 8.9–10.3)
Chloride: 100 mmol/L (ref 98–111)
Creatinine: 0.89 mg/dL (ref 0.61–1.24)
GFR, Estimated: >60 mL/min (ref >60)
Glucose, Bld: 147 mg/dL — ABNORMAL HIGH (ref 70–99)
Potassium: 3.8 mmol/L (ref 3.5–5.1)
Sodium: 136 mmol/L (ref 135–145)
Total Bilirubin: 0.8 mg/dL (ref 0.3–1.2)
Total Protein: 6.4 g/dL — ABNORMAL LOW (ref 6.5–8.1)

## 2022-07-05 LAB — CBC WITH DIFFERENTIAL (CANCER CENTER ONLY)
Abs Immature Granulocytes: 0.02 10*3/uL (ref 0.00–0.07)
Basophils Absolute: 0 10*3/uL (ref 0.0–0.1)
Basophils Relative: 1 %
Eosinophils Absolute: 0 10*3/uL (ref 0.0–0.5)
Eosinophils Relative: 1 %
HCT: 27.8 % — ABNORMAL LOW (ref 39.0–52.0)
Hemoglobin: 10 g/dL — ABNORMAL LOW (ref 13.0–17.0)
Immature Granulocytes: 2 %
Lymphocytes Relative: 22 %
Lymphs Abs: 0.2 10*3/uL — ABNORMAL LOW (ref 0.7–4.0)
MCH: 33.2 pg (ref 26.0–34.0)
MCHC: 36 g/dL (ref 30.0–36.0)
MCV: 92.4 fL (ref 80.0–100.0)
Monocytes Absolute: 0.3 10*3/uL (ref 0.1–1.0)
Monocytes Relative: 38 %
Neutro Abs: 0.3 10*3/uL — CL (ref 1.7–7.7)
Neutrophils Relative %: 36 %
Platelet Count: 203 10*3/uL (ref 150–400)
RBC: 3.01 MIL/uL — ABNORMAL LOW (ref 4.22–5.81)
RDW: 19.3 % — ABNORMAL HIGH (ref 11.5–15.5)
Smear Review: NORMAL
WBC Count: 0.8 10*3/uL — CL (ref 4.0–10.5)
nRBC: 0 % (ref 0.0–0.2)

## 2022-07-05 MED ORDER — FILGRASTIM-SNDZ 480 MCG/0.8ML IJ SOSY
480.0000 ug | PREFILLED_SYRINGE | Freq: Once | INTRAMUSCULAR | Status: AC
  Administered 2022-07-05: 480 ug via SUBCUTANEOUS
  Filled 2022-07-05: qty 0.8

## 2022-07-05 MED ORDER — SODIUM CHLORIDE 0.9 % IV SOLN: INTRAVENOUS | Status: AC

## 2022-07-05 NOTE — Progress Notes (Signed)
[name] [name] is here today for follow up post radiation to the abdomen.  They completed their radiation on: 06/22/2022   Does the patient complain of any of the following:  Pain:no Abdominal bloating: no Diarrhea/Constipation: no Nausea/Vomiting: nausea occasionally but passes quickly Post radiation skin changes: no No swallowing issues now.   Additional comments if applicable: reports feeling dizzy when standing and can only walk a small amount without sitting down.  He feel last Thursday and hit a chair on his way down.  He had a black eye on the right.  He also fell yesterday outside and did not hurt himself.  BP (!) 82/53 (BP Location: Right Arm, Patient Position: Standing)   Pulse (!) 146   Temp 97.6 F (36.4 C)   Resp 18   Ht '5\' 10"'$  (1.778 m)   Wt 182 lb 12.8 oz (82.9 kg)   SpO2 100%   BMI 26.23 kg/m

## 2022-07-05 NOTE — Progress Notes (Signed)
Received call from lab regarding critical results: WBC 0.8 and ANC 0.3.  Dr. Sondra Come notified.

## 2022-07-05 NOTE — Patient Instructions (Signed)
Rehydration, Adult Rehydration is the replacement of body fluids, salts, and minerals (electrolytes) that are lost during dehydration. Dehydration is when there is not enough water or other fluids in the body. This happens when you lose more fluids than you take in. Common causes of dehydration include: Not drinking enough fluids. This can occur when you are ill or doing activities that require a lot of energy, especially in hot weather. Conditions that cause loss of water or other fluids, such as diarrhea, vomiting, sweating, or urinating a lot. Other illnesses, such as fever or infection. Certain medicines, such as those that remove excess fluid from the body (diuretics). Symptoms of mild or moderate dehydration may include thirst, dry lips and mouth, and dizziness. Symptoms of severe dehydration may include increased heart rate, confusion, fainting, and not urinating. For severe dehydration, you may need to get fluids through an IV at the hospital. For mild or moderate dehydration, you can usually rehydrate at home by drinking certain fluids as told by your health care provider. What are the risks? Generally, rehydration is safe. However, taking in too much fluid (overhydration) can be a problem. This is rare. Overhydration can cause an electrolyte imbalance, kidney failure, or a decrease in salt (sodium) levels in the body. Supplies needed You will need an oral rehydration solution (ORS) if your health care provider tells you to use one. This is a drink to treat dehydration. It can be found in pharmacies and retail stores. How to rehydrate Fluids Follow instructions from your health care provider for rehydration. The kind of fluid and the amount you should drink depend on your condition. In general, you should choose drinks that you prefer. If told by your health care provider, drink an ORS. Make an ORS by following instructions on the package. Start by drinking small amounts, about  cup (120  mL) every 5-10 minutes. Slowly increase how much you drink until you have taken the amount recommended by your health care provider. Drink enough clear fluids to keep your urine pale yellow. If you were told to drink an ORS, finish it first, then start slowly drinking other clear fluids. Drink fluids such as: Water. This includes sparkling water and flavored water. Drinking only water can lead to having too little sodium in your body (hyponatremia). Follow the advice of your health care provider. Water from ice chips you suck on. Fruit juice with water you add to it (diluted). Sports drinks. Hot or cold herbal teas. Broth-based soups. Milk or milk products. Food Follow instructions from your health care provider about what to eat while you rehydrate. Your health care provider may recommend that you slowly begin eating regular foods in small amounts. Eat foods that contain a healthy balance of electrolytes, such as bananas, oranges, potatoes, tomatoes, and spinach. Avoid foods that are greasy or contain a lot of sugar. In some cases, you may get nutrition through a feeding tube that is passed through your nose and into your stomach (nasogastric tube, or NG tube). This may be done if you have uncontrolled vomiting or diarrhea. Beverages to avoid  Certain beverages may make dehydration worse. While you rehydrate, avoid drinking alcohol. How to tell if you are recovering from dehydration You may be recovering from dehydration if: You are urinating more often than before you started rehydrating. Your urine is pale yellow. Your energy level improves. You vomit less frequently. You have diarrhea less frequently. Your appetite improves or returns to normal. You feel less dizzy or less light-headed.   Your skin tone and color start to look more normal. Follow these instructions at home: Take over-the-counter and prescription medicines only as told by your health care provider. Do not take sodium  tablets. Doing this can lead to having too much sodium in your body (hypernatremia). Contact a health care provider if: You continue to have symptoms of mild or moderate dehydration, such as: Thirst. Dry lips. Slightly dry mouth. Dizziness. Dark urine or less urine than normal. Muscle cramps. You continue to vomit or have diarrhea. Get help right away if you: Have symptoms of dehydration that get worse. Have a fever. Have a severe headache. Have been vomiting and the following happens: Your vomiting gets worse or does not go away. Your vomit includes blood or green matter (bile). You cannot eat or drink without vomiting. Have problems with urination or bowel movements, such as: Diarrhea that gets worse or does not go away. Blood in your stool (feces). This may cause stool to look black and tarry. Not urinating, or urinating only a small amount of very dark urine, within 6-8 hours. Have trouble breathing. Have symptoms that get worse with treatment. These symptoms may represent a serious problem that is an emergency. Do not wait to see if the symptoms will go away. Get medical help right away. Call your local emergency services (911 in the [place] Do not drive yourself to the hospital. Summary Rehydration is the replacement of body fluids and minerals (electrolytes) that are lost during dehydration. Follow instructions from your health care provider for rehydration. The kind of fluid and amount you should drink depend on your condition. Slowly increase how much you drink until you have taken the amount recommended by your health care provider. Contact your health care provider if you continue to show signs of mild or moderate dehydration. This information is not intended to replace advice given to you by your health care provider. Make sure you discuss any questions you have with your health care provider. Document Revised: 12/04/2019 Document Reviewed: 10/14/2019 Elsevier Patient  Education  2023 [employer]  

## 2022-07-06 ENCOUNTER — Inpatient Hospital Stay: Payer: No Typology Code available for payment source

## 2022-07-06 ENCOUNTER — Other Ambulatory Visit: Payer: Self-pay

## 2022-07-06 ENCOUNTER — Ambulatory Visit: Payer: No Typology Code available for payment source

## 2022-07-06 VITALS — BP 92/76 | HR 87 | Temp 98.2°F | Resp 18

## 2022-07-06 DIAGNOSIS — C160 Malignant neoplasm of cardia: Secondary | ICD-10-CM

## 2022-07-06 DIAGNOSIS — Z510 Encounter for antineoplastic radiation therapy: Secondary | ICD-10-CM | POA: Diagnosis not present

## 2022-07-06 DIAGNOSIS — T451X5A Adverse effect of antineoplastic and immunosuppressive drugs, initial encounter: Secondary | ICD-10-CM

## 2022-07-06 MED ORDER — FILGRASTIM-SNDZ 480 MCG/0.8ML IJ SOSY
480.0000 ug | PREFILLED_SYRINGE | Freq: Once | INTRAMUSCULAR | Status: AC
  Administered 2022-07-06: 480 ug via SUBCUTANEOUS
  Filled 2022-07-06: qty 0.8

## 2022-07-07 ENCOUNTER — Inpatient Hospital Stay: Payer: No Typology Code available for payment source

## 2022-07-07 ENCOUNTER — Ambulatory Visit: Payer: No Typology Code available for payment source

## 2022-07-07 ENCOUNTER — Other Ambulatory Visit: Payer: Self-pay

## 2022-07-07 VITALS — BP 87/64 | HR 89 | Temp 97.8°F | Resp 16

## 2022-07-07 DIAGNOSIS — T451X5A Adverse effect of antineoplastic and immunosuppressive drugs, initial encounter: Secondary | ICD-10-CM

## 2022-07-07 DIAGNOSIS — C160 Malignant neoplasm of cardia: Secondary | ICD-10-CM

## 2022-07-07 DIAGNOSIS — Z510 Encounter for antineoplastic radiation therapy: Secondary | ICD-10-CM | POA: Diagnosis not present

## 2022-07-07 MED ORDER — SODIUM CHLORIDE 0.9 % IV SOLN: INTRAVENOUS | Status: AC

## 2022-07-07 MED ORDER — FILGRASTIM-SNDZ 480 MCG/0.8ML IJ SOSY
480.0000 ug | PREFILLED_SYRINGE | Freq: Once | INTRAMUSCULAR | Status: AC
  Administered 2022-07-07: 480 ug via SUBCUTANEOUS
  Filled 2022-07-07: qty 0.8

## 2022-07-07 NOTE — Patient Instructions (Signed)
Rehydration, Adult Rehydration is the replacement of body fluids, salts, and minerals (electrolytes) that are lost during dehydration. Dehydration is when there is not enough water or other fluids in the body. This happens when you lose more fluids than you take in. Common causes of dehydration include: Not drinking enough fluids. This can occur when you are ill or doing activities that require a lot of energy, especially in hot weather. Conditions that cause loss of water or other fluids, such as diarrhea, vomiting, sweating, or urinating a lot. Other illnesses, such as fever or infection. Certain medicines, such as those that remove excess fluid from the body (diuretics). Symptoms of mild or moderate dehydration may include thirst, dry lips and mouth, and dizziness. Symptoms of severe dehydration may include increased heart rate, confusion, fainting, and not urinating. For severe dehydration, you may need to get fluids through an IV at the hospital. For mild or moderate dehydration, you can usually rehydrate at home by drinking certain fluids as told by your health care provider. What are the risks? Generally, rehydration is safe. However, taking in too much fluid (overhydration) can be a problem. This is rare. Overhydration can cause an electrolyte imbalance, kidney failure, or a decrease in salt (sodium) levels in the body. Supplies needed You will need an oral rehydration solution (ORS) if your health care provider tells you to use one. This is a drink to treat dehydration. It can be found in pharmacies and retail stores. How to rehydrate Fluids Follow instructions from your health care provider for rehydration. The kind of fluid and the amount you should drink depend on your condition. In general, you should choose drinks that you prefer. If told by your health care provider, drink an ORS. Make an ORS by following instructions on the package. Start by drinking small amounts, about  cup (120  mL) every 5-10 minutes. Slowly increase how much you drink until you have taken the amount recommended by your health care provider. Drink enough clear fluids to keep your urine pale yellow. If you were told to drink an ORS, finish it first, then start slowly drinking other clear fluids. Drink fluids such as: Water. This includes sparkling water and flavored water. Drinking only water can lead to having too little sodium in your body (hyponatremia). Follow the advice of your health care provider. Water from ice chips you suck on. Fruit juice with water you add to it (diluted). Sports drinks. Hot or cold herbal teas. Broth-based soups. Milk or milk products. Food Follow instructions from your health care provider about what to eat while you rehydrate. Your health care provider may recommend that you slowly begin eating regular foods in small amounts. Eat foods that contain a healthy balance of electrolytes, such as bananas, oranges, potatoes, tomatoes, and spinach. Avoid foods that are greasy or contain a lot of sugar. In some cases, you may get nutrition through a feeding tube that is passed through your nose and into your stomach (nasogastric tube, or NG tube). This may be done if you have uncontrolled vomiting or diarrhea. Beverages to avoid  Certain beverages may make dehydration worse. While you rehydrate, avoid drinking alcohol. How to tell if you are recovering from dehydration You may be recovering from dehydration if: You are urinating more often than before you started rehydrating. Your urine is pale yellow. Your energy level improves. You vomit less frequently. You have diarrhea less frequently. Your appetite improves or returns to normal. You feel less dizzy or less light-headed.   Your skin tone and color start to look more normal. Follow these instructions at home: Take over-the-counter and prescription medicines only as told by your health care provider. Do not take sodium  tablets. Doing this can lead to having too much sodium in your body (hypernatremia). Contact a health care provider if: You continue to have symptoms of mild or moderate dehydration, such as: Thirst. Dry lips. Slightly dry mouth. Dizziness. Dark urine or less urine than normal. Muscle cramps. You continue to vomit or have diarrhea. Get help right away if you: Have symptoms of dehydration that get worse. Have a fever. Have a severe headache. Have been vomiting and the following happens: Your vomiting gets worse or does not go away. Your vomit includes blood or green matter (bile). You cannot eat or drink without vomiting. Have problems with urination or bowel movements, such as: Diarrhea that gets worse or does not go away. Blood in your stool (feces). This may cause stool to look black and tarry. Not urinating, or urinating only a small amount of very dark urine, within 6-8 hours. Have trouble breathing. Have symptoms that get worse with treatment. These symptoms may represent a serious problem that is an emergency. Do not wait to see if the symptoms will go away. Get medical help right away. Call your local emergency services (911 in the [place] Do not drive yourself to the hospital. Summary Rehydration is the replacement of body fluids and minerals (electrolytes) that are lost during dehydration. Follow instructions from your health care provider for rehydration. The kind of fluid and amount you should drink depend on your condition. Slowly increase how much you drink until you have taken the amount recommended by your health care provider. Contact your health care provider if you continue to show signs of mild or moderate dehydration. This information is not intended to replace advice given to you by your health care provider. Make sure you discuss any questions you have with your health care provider. Document Revised: 12/04/2019 Document Reviewed: 10/14/2019 Elsevier Patient  Education  2023 [employer]  

## 2022-07-20 ENCOUNTER — Encounter: Payer: Self-pay | Admitting: Radiation Oncology

## 2022-07-21 ENCOUNTER — Ambulatory Visit (HOSPITAL_COMMUNITY)
Admission: RE | Admit: 2022-07-21 | Discharge: 2022-07-21 | Disposition: A | Discharge status: Home / Self Care | Payer: No Typology Code available for payment source | Source: Ambulatory Visit | Attending: Hematology | Admitting: Hematology

## 2022-07-21 DIAGNOSIS — C160 Malignant neoplasm of cardia: Secondary | ICD-10-CM | POA: Diagnosis present

## 2022-07-21 MED ORDER — FLUDEOXYGLUCOSE F - 18 (FDG) INJECTION
9.9800 | Freq: Once | INTRAVENOUS | Status: AC | PRN
  Administered 2022-07-21: 9.98 via INTRAVENOUS

## 2022-07-22 ENCOUNTER — Other Ambulatory Visit: Payer: Self-pay | Admitting: *Deleted

## 2022-07-22 ENCOUNTER — Ambulatory Visit (INDEPENDENT_AMBULATORY_CARE_PROVIDER_SITE_OTHER): Payer: No Typology Code available for payment source | Admitting: Thoracic Surgery (Cardiothoracic Vascular Surgery)

## 2022-07-22 ENCOUNTER — Encounter: Payer: Self-pay | Admitting: *Deleted

## 2022-07-22 ENCOUNTER — Ambulatory Visit: Payer: Self-pay | Admitting: Radiation Oncology

## 2022-07-22 ENCOUNTER — Encounter: Payer: Self-pay | Admitting: Hematology

## 2022-07-22 ENCOUNTER — Inpatient Hospital Stay: Payer: No Typology Code available for payment source | Attending: Hematology | Admitting: Dietician

## 2022-07-22 ENCOUNTER — Other Ambulatory Visit: Payer: Self-pay | Admitting: Thoracic Surgery (Cardiothoracic Vascular Surgery)

## 2022-07-22 ENCOUNTER — Encounter: Payer: Self-pay | Admitting: Internal Medicine

## 2022-07-22 VITALS — BP 104/73 | HR 102 | Resp 18 | Ht 70.0 in | Wt 188.0 lb

## 2022-07-22 DIAGNOSIS — C160 Malignant neoplasm of cardia: Secondary | ICD-10-CM

## 2022-07-22 MED ORDER — GOLYTELY 236 G PO SOLR: 4000.0000 mL | Freq: Once | ORAL | 0 refills | Status: AC

## 2022-07-22 NOTE — Progress Notes (Signed)
[address]       [place] [address]             [phone]                    Mickel L Bolger [place] Medical Record #[id number] Date of Birth: Dec 27, 1960  Referring: Derek Jack, MD Primary Care: Derek Jack, MD Primary Cardiologist: None  Chief Complaint:    Chief Complaint  Patient presents with   Esophageal Cancer    PET 10/6    History of Present Illness:    [name] [name] 61 y.o. male referred by Dr. 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  He recently presented with dysphagia and intentional weight loss in April 2023.  Subsequently this restriction.  He subsequently underwent upper endoscopy with biopsy along with endoscopic ultrasound which confirmed the diagnosis.  He is able to tolerate a regular diet with occasional dysphagia.  He has started an exercise program follow-up last year and has intentional been losing weight.  He completed his neoadjuvant chemotherapy and radiation a month ago.  He comes in today to discuss results from his PET/CT.  He is able to tolerate a diet and has been able to put on some weight.      Zubrod Score: At the time of surgery this patient's most appropriate activity status/level should be described as: '[x]'$     0    Normal activity, no symptoms '[]'$     1    Restricted in physical strenuous activity but ambulatory, able to do out light work '[]'$     2    Ambulatory and capable of self care, unable to do work activities, up and about               >50 % of waking hours                              '[]'$     3    Only limited self care, in bed greater than 50% of waking hours '[]'$     4    Completely disabled, no self care, confined to bed or chair '[]'$     5    Moribund   Past Medical History:  Diagnosis Date   History of radiation therapy    Esophagus- 05/12/22-06/22/22- Dr. Gery Pray   Port-A-Cath in place 05/11/2022    Past Surgical History:   Procedure Laterality Date   BIOPSY  03/25/2022   Procedure: BIOPSY;  Surgeon: Harvel Quale, MD;  [facility]: [facility];  Service: Gastroenterology;;  GE junction;   BIOPSY  04/25/2022   Procedure: BIOPSY;  Surgeon: Irving Copas., MD;  Location: [facility];  Service: Gastroenterology;;   COLONOSCOPY     COLONOSCOPY WITH PROPOFOL N/A 03/25/2022   Procedure: COLONOSCOPY WITH PROPOFOL;  Surgeon: Harvel Quale, MD;  [facility];  Service: Gastroenterology;  Laterality: N/A;  945 ASA 1   ESOPHAGOGASTRODUODENOSCOPY (EGD) WITH PROPOFOL N/A 03/25/2022   Procedure: ESOPHAGOGASTRODUODENOSCOPY (EGD) WITH PROPOFOL;  Surgeon: Harvel Quale, MD;  [facility];  Service: Gastroenterology;  Laterality: N/A;   ESOPHAGOGASTRODUODENOSCOPY (EGD) WITH PROPOFOL N/A 04/25/2022   Procedure: ESOPHAGOGASTRODUODENOSCOPY (EGD) WITH PROPOFOL;  Surgeon: Rush Landmark Telford Nab., MD;  Location: [facility];  Service: Gastroenterology;  Laterality: N/A;   IR IMAGING GUIDED PORT INSERTION  05/04/2022   UPPER ESOPHAGEAL  ENDOSCOPIC ULTRASOUND (EUS) N/A 04/25/2022   Procedure: UPPER ESOPHAGEAL ENDOSCOPIC ULTRASOUND (EUS);  Surgeon: Irving Copas., MD;  Location: [facility];  Service: Gastroenterology;  Laterality: N/A;    Family History  Problem Relation Age of Onset   Cancer Maternal Grandmother        Breast cancer   Cancer Other        Maternal Cousin - breast cancer     Social History   Tobacco Use  Smoking Status Never   Passive exposure: Past  Smokeless Tobacco Never    Social History   Substance and Sexual Activity  Alcohol Use Yes   Comment: 1 daily     No Known Allergies  Current Outpatient Medications  Medication Sig Dispense Refill   CARBOPLATIN IV Inject into the vein once a week. (Patient not taking: Reported on 07/22/2022)     omeprazole (PRILOSEC) 40 MG capsule Take 40 mg by mouth 2 (two) times daily. (Patient not  taking: Reported on 07/22/2022)     PACLitaxel (TAXOL IV) Inject into the vein once a week. (Patient not taking: Reported on 07/22/2022)     polyethylene glycol (GOLYTELY) 236 g solution Take 4,000 mLs by mouth once for 1 dose. Drink 1/2 of the bottle, mid morning, the day before surgery. 4000 mL 0   prochlorperazine (COMPAZINE) 10 MG tablet Take 1 tablet (10 mg total) by mouth every 6 (six) hours as needed (Nausea or vomiting). (Patient not taking: Reported on 07/22/2022) 60 tablet 3   No current facility-administered medications for this visit.    Review of Systems  Constitutional:  Positive for malaise/fatigue and weight loss.  Respiratory:  Negative for cough and shortness of breath.   Cardiovascular:  Negative for chest pain.  Gastrointestinal:  Positive for heartburn.  Neurological: Negative.      PHYSICAL EXAMINATION: BP 104/73 (BP Location: Right Arm, Patient Position: Sitting)   Pulse (!) 102   Resp 18   Ht '5\' 10"'$  (1.778 m)   Wt 188 lb (85.3 kg)   SpO2 100% Comment: RA  BMI 26.98 kg/m  Physical Exam Constitutional:      General: He is not in acute distress.    Appearance: Normal appearance. He is not ill-appearing.  HENT:     Head: Normocephalic and atraumatic.  Eyes:     Extraocular Movements: Extraocular movements intact.  Cardiovascular:     Rate and Rhythm: Normal rate.  Pulmonary:     Effort: Pulmonary effort is normal.  Abdominal:     General: Abdomen is flat. There is no distension.  Musculoskeletal:        General: Normal range of motion.     Cervical back: Normal range of motion.  Skin:    General: Skin is warm and dry.  Neurological:     General: No focal deficit present.     Mental Status: He is alert and oriented to person, place, and time.     Diagnostic Studies & Laboratory data:      EGD/EUS:  04/25/22 EGD Impression: - No gross lesions in esophagus proximally. - Partially obstructing (stenosed to approximately 9 mm - had to use gentle  pressure to allow scope to traverse this region (9.8 mm)), malignant esophageal tumor was found at the distal esophagus extending into the gastroesophageal junction and into the cardia. - Gastritis. Biopsied. - No gross lesions in the duodenal bulb, in the first portion of the duodenum and in the second portion of the duodenum. EUS Impression: - A  mass was found at the distal esophagus extending into the GE Jxn and the cardia. A tissue diagnosis was obtained prior to this exam which is consistent with adenocarcinoma. The lesion extends through the MP. This was staged T3 N0 Mx by endosonographic criteria. - No malignant-appearing lymph nodes were visualized in the middle paraesophageal mediastinum (level 63M) and lower paraesophageal mediastinum (level 8L).     Path:  03/25/22 FINAL MICROSCOPIC DIAGNOSIS:   A. 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  See comment      I have independently reviewed the above radiology studies  and reviewed the findings with the patient.   Recent Lab Findings: Lab Results  Component Value Date   WBC 0.8 (LL) 07/05/2022   HGB 10.0 (L) 07/05/2022   HCT 27.8 (L) 07/05/2022   PLT 203 07/05/2022   GLUCOSE 147 (H) 07/05/2022   ALT 9 07/05/2022   AST 14 (L) 07/05/2022   NA 136 07/05/2022   K 3.8 07/05/2022   CL 100 07/05/2022   CREATININE 0.89 07/05/2022   BUN 14 07/05/2022   CO2 28 07/05/2022      Assessment / Plan:   61 year old male with T3 N0 M0 GE junction adenocarcinoma.  Based on its location its spans from 27 cm to 42 cm on endoscopy.  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  He recently completed his radiation a month ago.  We discussed the risks and benefits of an EGD, robotic assisted Ivor Lewis esophagectomy, and jejunostomy tube placement.  He is agreeable to proceed.  He is scheduled for early November.  He will undergo a  stress test prior to surgery.  I  spent 30 minutes with the patient face to face counseling and coordination of care.    Lajuana Matte 07/22/2022 5:38 PM

## 2022-07-22 NOTE — Progress Notes (Signed)
Nutrition Follow-up:  Patient with cancer of GE junction. He has completed neoadjuvant chemoradiation therapy (final radiation 9/6). Planning for adjuvant surgery under the care of Dr. Kipp Brood.  Met with patient in office. Patient met with Dr. Kipp Brood this morning, reports surgery has been scheduled for 11/2. 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 He reports radiation esophagitis has resolved. Patient says this was incredibly painful which made oral intake challenging. He is now tolerating a regular diet without difficulty. Patient is not drinking oral supplements currently. Says he will get all of his protein with real food now that he is able. Patient endorses lingering altered taste. Foods have a slightly bitter taste - "like burnt toast" This is tolerable. He smells this on occasion as well. He is eating 3 meals. Recalls eggs with cheese and kielbasa for breakfast, 2 hamburger patties, fries for lunch, chicken pasta with spinach for dinner. Patient drinking ~68-85 ounces of water. He has started walking again. Recalls walking [place] with his son last weekend which was ~2.5 miles. He enjoyed this. 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 He denies nausea, vomiting, diarrhea, constipation.     Medications: reviewed   Labs: 9/19 labs reviewed  Anthropometrics: Wt 188 lb today increased 6 lbs in the last 2 weeks - desirable  9/19 - 182 lb 12.8 oz 8/17 - 196 lb 4.8 oz  7/27 - 208 lb 11.2 oz   Weights have decreased 9.6% (20 lbs) s/p start of neoadjuvant therapy - significant   NUTRITION DIAGNOSIS: Food and nutrition related knowledge deficit improving   INTERVENTION:  Encouraged 3 meals + 3 snacks with good sources of protein to support post-op healing Continue strategies for increasing calories and protein to promote weight gain Encouraged activity as able  Patient has  contact information    MONITORING, EVALUATION, GOAL: weight trends, intake, surgery   NEXT VISIT: Pt pending surgery - will monitor and schedule f/u as able

## 2022-07-22 NOTE — H&P (View-Only) (Signed)
[address]       [place] [address]             [phone]                    Jarquavious L Dymond [place]Chee Medical Record #[id number] Date of Birth: [birth year]  Referring: Derek Jack, MD Primary Care: Derek Jack, MD Primary Cardiologist: None  Chief Complaint:    Chief Complaint  Patient presents with   Esophageal Cancer    PET 10/6    History of Present Illness:    [name] [name] 61 y.o. male referred by Dr. 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  He recently presented with dysphagia and intentional weight loss in April 2023.  Subsequently this restriction.  He subsequently underwent upper endoscopy with biopsy along with endoscopic ultrasound which confirmed the diagnosis.  He is able to tolerate a regular diet with occasional dysphagia.  He has started an exercise program follow-up last year and has intentional been losing weight.  He completed his neoadjuvant chemotherapy and radiation a month ago.  He comes in today to discuss results from his PET/CT.  He is able to tolerate a diet and has been able to put on some weight.      Zubrod Score: At the time of surgery this patient's most appropriate activity status/level should be described as: '[x]'$     0    Normal activity, no symptoms '[]'$     1    Restricted in physical strenuous activity but ambulatory, able to do out light work '[]'$     2    Ambulatory and capable of self care, unable to do work activities, up and about               >50 % of waking hours                              '[]'$     3    Only limited self care, in bed greater than 50% of waking hours '[]'$     4    Completely disabled, no self care, confined to bed or chair '[]'$     5    Moribund   Past Medical History:  Diagnosis Date   History of radiation therapy    Esophagus- 05/12/22-06/22/22- Dr. Gery Pray   Port-A-Cath in place 05/11/2022    Past Surgical History:   Procedure Laterality Date   BIOPSY  03/25/2022   Procedure: BIOPSY;  Surgeon: Harvel Quale, MD;  [facility]: [facility];  Service: Gastroenterology;;  GE junction;   BIOPSY  04/25/2022   Procedure: BIOPSY;  Surgeon: Irving Copas., MD;  Location: [facility];  Service: Gastroenterology;;   COLONOSCOPY     COLONOSCOPY WITH PROPOFOL N/A 03/25/2022   Procedure: COLONOSCOPY WITH PROPOFOL;  Surgeon: Harvel Quale, MD;  [facility];  Service: Gastroenterology;  Laterality: N/A;  945 ASA 1   ESOPHAGOGASTRODUODENOSCOPY (EGD) WITH PROPOFOL N/A 03/25/2022   Procedure: ESOPHAGOGASTRODUODENOSCOPY (EGD) WITH PROPOFOL;  Surgeon: Harvel Quale, MD;  [facility];  Service: Gastroenterology;  Laterality: N/A;   ESOPHAGOGASTRODUODENOSCOPY (EGD) WITH PROPOFOL N/A 04/25/2022   Procedure: ESOPHAGOGASTRODUODENOSCOPY (EGD) WITH PROPOFOL;  Surgeon: Rush Landmark Telford Nab., MD;  Location: [facility];  Service: Gastroenterology;  Laterality: N/A;   IR IMAGING GUIDED PORT INSERTION  05/04/2022   UPPER ESOPHAGEAL  ENDOSCOPIC ULTRASOUND (EUS) N/A 04/25/2022   Procedure: UPPER ESOPHAGEAL ENDOSCOPIC ULTRASOUND (EUS);  Surgeon: Irving Copas., MD;  Location: [facility];  Service: Gastroenterology;  Laterality: N/A;    Family History  Problem Relation Age of Onset   Cancer Maternal Grandmother        Breast cancer   Cancer Other        Maternal Cousin - breast cancer     Social History   Tobacco Use  Smoking Status Never   Passive exposure: Past  Smokeless Tobacco Never    Social History   Substance and Sexual Activity  Alcohol Use Yes   Comment: 1 daily     No Known Allergies  Current Outpatient Medications  Medication Sig Dispense Refill   CARBOPLATIN IV Inject into the vein once a week. (Patient not taking: Reported on 07/22/2022)     omeprazole (PRILOSEC) 40 MG capsule Take 40 mg by mouth 2 (two) times daily. (Patient not  taking: Reported on 07/22/2022)     PACLitaxel (TAXOL IV) Inject into the vein once a week. (Patient not taking: Reported on 07/22/2022)     polyethylene glycol (GOLYTELY) 236 g solution Take 4,000 mLs by mouth once for 1 dose. Drink 1/2 of the bottle, mid morning, the day before surgery. 4000 mL 0   prochlorperazine (COMPAZINE) 10 MG tablet Take 1 tablet (10 mg total) by mouth every 6 (six) hours as needed (Nausea or vomiting). (Patient not taking: Reported on 07/22/2022) 60 tablet 3   No current facility-administered medications for this visit.    Review of Systems  Constitutional:  Positive for malaise/fatigue and weight loss.  Respiratory:  Negative for cough and shortness of breath.   Cardiovascular:  Negative for chest pain.  Gastrointestinal:  Positive for heartburn.  Neurological: Negative.      PHYSICAL EXAMINATION: BP 104/73 (BP Location: Right Arm, Patient Position: Sitting)   Pulse (!) 102   Resp 18   Ht '5\' 10"'$  (1.778 m)   Wt 188 lb (85.3 kg)   SpO2 100% Comment: RA  BMI 26.98 kg/m  Physical Exam Constitutional:      General: He is not in acute distress.    Appearance: Normal appearance. He is not ill-appearing.  HENT:     Head: Normocephalic and atraumatic.  Eyes:     Extraocular Movements: Extraocular movements intact.  Cardiovascular:     Rate and Rhythm: Normal rate.  Pulmonary:     Effort: Pulmonary effort is normal.  Abdominal:     General: Abdomen is flat. There is no distension.  Musculoskeletal:        General: Normal range of motion.     Cervical back: Normal range of motion.  Skin:    General: Skin is warm and dry.  Neurological:     General: No focal deficit present.     Mental Status: He is alert and oriented to person, place, and time.     Diagnostic Studies & Laboratory data:      EGD/EUS:  04/25/22 EGD Impression: - No gross lesions in esophagus proximally. - Partially obstructing (stenosed to approximately 9 mm - had to use gentle  pressure to allow scope to traverse this region (9.8 mm)), malignant esophageal tumor was found at the distal esophagus extending into the gastroesophageal junction and into the cardia. - Gastritis. Biopsied. - No gross lesions in the duodenal bulb, in the first portion of the duodenum and in the second portion of the duodenum. EUS Impression: - A  mass was found at the distal esophagus extending into the GE Jxn and the cardia. A tissue diagnosis was obtained prior to this exam which is consistent with adenocarcinoma. The lesion extends through the MP. This was staged T3 N0 Mx by endosonographic criteria. - No malignant-appearing lymph nodes were visualized in the middle paraesophageal mediastinum (level 58M) and lower paraesophageal mediastinum (level 8L).     Path:  03/25/22 FINAL MICROSCOPIC DIAGNOSIS:   A. 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  See comment      I have independently reviewed the above radiology studies  and reviewed the findings with the patient.   Recent Lab Findings: Lab Results  Component Value Date   WBC 0.8 (LL) 07/05/2022   HGB 10.0 (L) 07/05/2022   HCT 27.8 (L) 07/05/2022   PLT 203 07/05/2022   GLUCOSE 147 (H) 07/05/2022   ALT 9 07/05/2022   AST 14 (L) 07/05/2022   NA 136 07/05/2022   K 3.8 07/05/2022   CL 100 07/05/2022   CREATININE 0.89 07/05/2022   BUN 14 07/05/2022   CO2 28 07/05/2022      Assessment / Plan:   61 year old male with T3 N0 M0 GE junction adenocarcinoma.  Based on its location its spans from 27 cm to 42 cm on endoscopy.  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  He recently completed his radiation a month ago.  We discussed the risks and benefits of an EGD, robotic assisted Ivor Lewis esophagectomy, and jejunostomy tube placement.  He is agreeable to proceed.  He is scheduled for early November.  He will undergo a  stress test prior to surgery.  I  spent 30 minutes with the patient face to face counseling and coordination of care.    Lajuana Matte 07/22/2022 5:38 PM

## 2022-07-23 ENCOUNTER — Other Ambulatory Visit: Payer: Self-pay

## 2022-07-24 NOTE — Progress Notes (Signed)
Radiation Oncology         [phone] ________________________________  Name: [name] [name] MRN: [id number]  Date: 07/25/2022  DOB: [birth year]  Follow-Up Visit Note  CC: Derek Jack, MD  Derek Jack, MD  No diagnosis found.  Diagnosis:  The encounter diagnosis was GE junction carcinoma ([place]).   Poorly differentiated gastric adenocarcinoma of the GE junction, with signet ring cell features   Cancer Staging  GE junction carcinoma ([place]) Staging form: Esophagus - Adenocarcinoma, AJCC 8th Edition - Clinical stage from 04/12/2022: Stage III (cT3, cN0, cM0, G3) - Unsigned  Interval Since Last Radiation: 1 month and 3 days   Intent: Curative  Radiation Treatment Dates: 05/12/2022 through 06/22/2022 Site Technique Total Dose (Gy) Dose per Fx (Gy) Completed Fx Beam Energies  Esophagus: Esoph IMRT 45/45 1.8 25/25 6X  Esophagus: Esoph_Bst IMRT 5.4/5.4 1.8 3/3 6X    Narrative:  The patient returns today for routine follow-up, he was last seen here for follow-up on 07/05/22 for evaluation of lightheadedness s/p concurrent chemoradiation.   Since his last visit, the patient had a restaging PET scan performed on 07/21/22 which showed findings compatible with an overall good response to treatment. Demonstrated by minimal residual fullness in the region of the distal esophagus/GE junction and mild residual hypermetabolism (SUV max of 4.55, previously 6.93). The mid-esophagus was also seen with an SUV max of 3.61. PET otherwise showed no findings suggestive of metastatic disease involving the chest or abdomen, and stability of the hypermetabolic left thyroid nodule.   The following day, the patient followed up with Dr. Kipp Brood to review PET findings and discuss further treatment options. Following discussion of the risks and benefits, the patient has opted to proceed with EGD, robotic assisted Ivor Lewis esophagectomy, and jejunostomy tube placement on 08/18/22 with Dr.  Kipp Brood.   ***                                Allergies:  has No Known Allergies.  Meds: Current Outpatient Medications  Medication Sig Dispense Refill   CARBOPLATIN IV Inject into the vein once a week. (Patient not taking: Reported on 07/22/2022)     omeprazole (PRILOSEC) 40 MG capsule Take 40 mg by mouth 2 (two) times daily. (Patient not taking: Reported on 07/22/2022)     PACLitaxel (TAXOL IV) Inject into the vein once a week. (Patient not taking: Reported on 07/22/2022)     prochlorperazine (COMPAZINE) 10 MG tablet Take 1 tablet (10 mg total) by mouth every 6 (six) hours as needed (Nausea or vomiting). (Patient not taking: Reported on 07/22/2022) 60 tablet 3   No current facility-administered medications for this encounter.    Physical Findings: The patient is in no acute distress. Patient is alert and oriented.  vitals were not taken for this visit. .  No significant changes. Lungs are clear to auscultation bilaterally. Heart has regular rate and rhythm. No palpable cervical, supraclavicular, or axillary adenopathy. Abdomen soft, non-tender, normal bowel sounds.   Lab Findings: Lab Results  Component Value Date   WBC 0.8 (LL) 07/05/2022   HGB 10.0 (L) 07/05/2022   HCT 27.8 (L) 07/05/2022   MCV 92.4 07/05/2022   PLT 203 07/05/2022    Radiographic Findings: NM PET Image Restag (PS) Skull Base To Thigh  Result Date: 07/24/2022 CLINICAL DATA:  Subsequent treatment strategy for esophageal cancer. EXAM: NUCLEAR MEDICINE PET SKULL BASE TO THIGH TECHNIQUE: 9.98 mCi F-18 FDG was  injected intravenously. Full-ring PET imaging was performed from the skull base to thigh after the radiotracer. CT data was obtained and used for attenuation correction and anatomic localization. Fasting blood glucose: 122 mg/dl COMPARISON:  PET-CT 04/07/2022 FINDINGS: Mediastinal blood pool activity: SUV max 1.97 Liver activity: SUV max NA NECK: No hypermetabolic lymph nodes in the neck. Stable 7.5 mm  hypermetabolic left thyroid nodule with SUV max of 4.18. Patient had a previous thyroid ultrasound examination and biopsy of a rim calcified right thyroid nodule. Recommend re-evaluation of this left thyroid nodule. Incidental CT findings: Stable rim calcified right thyroid lesion. CHEST: No hypermetabolic mediastinal or hilar nodes. No suspicious pulmonary nodules on the CT scan. Mild residual fullness involving the distal esophagus/GE junction. Minimal residual hypermetabolism with SUV max of 4.55. This was previously 6.93. The midesophagus SUV max is 3.61. Findings suggest a good response to treatment. No chest wall masses, supraclavicular or axillary adenopathy. Incidental CT findings: Right-sided Port-A-Cath is stable. ABDOMEN/PELVIS: No findings for hepatic metastatic disease. No enlarged or hypermetabolic gastrohepatic ligament celiac axis lymph nodes. The adrenal glands, pancreas and spleen are unremarkable. No retroperitoneal pelvic adenopathy. Incidental CT findings: Scattered aortic and iliac artery calcifications but no aneurysm. Small periumbilical abdominal wall hernia containing fat. Mild prostate gland enlargement. SKELETON: No focal hypermetabolic activity to suggest skeletal metastasis. Incidental CT findings: None. IMPRESSION: 1. Minimal residual fullness in the region of the distal esophagus/GE junction and mild residual hypermetabolism as above. 2. No findings for metastatic disease involving the chest or abdomen. 3. Stable hypermetabolic left thyroid nodule. Recommend ultrasound follow-up. Electronically Signed   By: Marijo Sanes M.D.   On: 07/24/2022 12:41    Impression: The encounter diagnosis was GE junction carcinoma ([place]).   Poorly differentiated gastric adenocarcinoma of the GE junction, with signet ring cell features  The patient is recovering from the effects of radiation.  ***  Plan:  ***   *** minutes of total time was spent for this patient encounter, including  preparation, face-to-face counseling with the patient and coordination of care, physical exam, and documentation of the encounter. ____________________________________  Blair Promise, PhD, MD  This document serves as a record of services personally performed by Gery Pray, MD. It was created on his behalf by Roney Mans, a trained medical scribe. The creation of this record is based on the scribe's personal observations and the provider's statements to them. This document has been checked and approved by the attending provider.

## 2022-07-25 ENCOUNTER — Encounter: Payer: Self-pay | Admitting: Radiation Oncology

## 2022-07-25 ENCOUNTER — Encounter: Payer: Self-pay | Admitting: *Deleted

## 2022-07-25 ENCOUNTER — Telehealth (HOSPITAL_COMMUNITY): Payer: Self-pay | Admitting: *Deleted

## 2022-07-25 ENCOUNTER — Ambulatory Visit
Admission: RE | Admit: 2022-07-25 | Discharge: 2022-07-25 | Disposition: A | Discharge status: Home / Self Care | Payer: No Typology Code available for payment source | Source: Ambulatory Visit | Attending: Radiation Oncology | Admitting: Radiation Oncology

## 2022-07-25 ENCOUNTER — Encounter (HOSPITAL_COMMUNITY): Payer: Self-pay | Admitting: Thoracic Surgery (Cardiothoracic Vascular Surgery)

## 2022-07-25 VITALS — BP 100/66 | HR 86 | Temp 97.2°F | Resp 18 | Ht 70.0 in | Wt 187.4 lb

## 2022-07-25 DIAGNOSIS — K439 Ventral hernia without obstruction or gangrene: Secondary | ICD-10-CM | POA: Diagnosis not present

## 2022-07-25 DIAGNOSIS — E041 Nontoxic single thyroid nodule: Secondary | ICD-10-CM | POA: Insufficient documentation

## 2022-07-25 DIAGNOSIS — C160 Malignant neoplasm of cardia: Secondary | ICD-10-CM | POA: Insufficient documentation

## 2022-07-25 DIAGNOSIS — Z79899 Other long term (current) drug therapy: Secondary | ICD-10-CM | POA: Diagnosis not present

## 2022-07-25 DIAGNOSIS — N400 Enlarged prostate without lower urinary tract symptoms: Secondary | ICD-10-CM | POA: Insufficient documentation

## 2022-07-25 DIAGNOSIS — R1319 Other dysphagia: Secondary | ICD-10-CM

## 2022-07-25 HISTORY — DX: Personal history of irradiation: Z92.3

## 2022-07-25 NOTE — Progress Notes (Signed)
[name] [name] is here today for follow up post radiation to the esophagus.   Treatment site: Esophagus,patient completed treatment on 06/22/22.  Does the patient complain of any of the following: Pain: No Shortness of breath w/wo exertion: No Cough: No Pain with swallowing: No, patient states he feels so much better.  Swallowing/choking concerns: No Appetite: Good Energy Level: Good Post radiation skin Changes: No    Additional comments if applicable:   BP 409/92 (BP Location: Right Arm, Patient Position: Sitting, Cuff Size: Normal)   Pulse 86   Temp (!) 97.2 F (36.2 C)   Resp 18   Ht '5\' 10"'$  (1.778 m)   Wt 187 lb 6.4 oz (85 kg)   SpO2 100%   BMI 26.89 kg/m

## 2022-07-25 NOTE — Telephone Encounter (Signed)
Patient given detailed instructions per Myocardial Perfusion Study Information Sheet for the test on 07/27/22 Patient notified to arrive 15 minutes early and that it is imperative to arrive on time for appointment to keep from having the test rescheduled.  If you need to cancel or reschedule your appointment, please call the office within 24 hours of your appointment. . Patient verbalized understanding.[name] [name]

## 2022-07-27 ENCOUNTER — Ambulatory Visit (HOSPITAL_COMMUNITY): Payer: No Typology Code available for payment source | Attending: Cardiology

## 2022-07-27 DIAGNOSIS — Z01810 Encounter for preprocedural cardiovascular examination: Secondary | ICD-10-CM

## 2022-07-27 DIAGNOSIS — R5383 Other fatigue: Secondary | ICD-10-CM | POA: Insufficient documentation

## 2022-07-27 DIAGNOSIS — Z9221 Personal history of antineoplastic chemotherapy: Secondary | ICD-10-CM | POA: Diagnosis not present

## 2022-07-27 DIAGNOSIS — C160 Malignant neoplasm of cardia: Secondary | ICD-10-CM | POA: Insufficient documentation

## 2022-07-27 LAB — MYOCARDIAL PERFUSION IMAGING
Base ST Depression (mm): 0 mm
LV dias vol: 69 mL (ref 62–150)
LV sys vol: 28 mL
Nuc Stress EF: 60 %
Peak HR: 120 {beats}/min
Rest HR: 64 {beats}/min
Rest Nuclear Isotope Dose: 10.4 mCi
SDS: 2
SRS: 0
SSS: 2
ST Depression (mm): 0 mm
Stress Nuclear Isotope Dose: 31.8 mCi
TID: 0.97

## 2022-07-27 MED ORDER — TECHNETIUM TC 99M TETROFOSMIN IV KIT
10.4000 | PACK | Freq: Once | INTRAVENOUS | Status: AC | PRN
  Administered 2022-07-27: 10.4 via INTRAVENOUS

## 2022-07-27 MED ORDER — REGADENOSON 0.4 MG/5ML IV SOLN
0.4000 mg | Freq: Once | INTRAVENOUS | Status: AC
  Administered 2022-07-27: 0.4 mg via INTRAVENOUS

## 2022-07-27 MED ORDER — TECHNETIUM TC 99M TETROFOSMIN IV KIT
31.8000 | PACK | Freq: Once | INTRAVENOUS | Status: AC | PRN
  Administered 2022-07-27: 31.8 via INTRAVENOUS

## 2022-08-03 ENCOUNTER — Other Ambulatory Visit: Payer: Self-pay

## 2022-08-12 NOTE — Pre-Procedure Instructions (Signed)
Surgical Instructions    Your procedure is scheduled on Thursday November 2.  Report to [facility] Main Entrance "A" at 5:30 A.M., then check in with the Admitting office.  Call this number if you have problems the morning of surgery:  [phone]  If you have any questions prior to your surgery date call [phone]: Open Monday-Friday 8am-4pm  If you experience any cold, Covid, or flu symptoms such as cough, fever, chills, shortness of breath, etc. between now and your scheduled surgery, please notify us at the above number     Remember:  Do not eat or drink after midnight the night before your surgery   Take these medications the morning of surgery with A SIP OF WATER: NONE   As of today, STOP taking any Aspirin (unless otherwise instructed by your surgeon) Aleve, Naproxen, Ibuprofen, Motrin, Advil, Goody's, BC's, all herbal medications, fish oil, and all vitamins.         Do NOT Smoke (Tobacco/Vaping)  24 hours prior to your procedure  If you use a CPAP at night, you may bring your mask for your overnight stay.   Contacts, glasses, hearing aids, dentures or partials may not be worn into surgery, please bring cases for these belongings   For patients admitted to the hospital, discharge time will be determined by your treatment team.   Patients discharged the day of surgery will not be allowed to drive home, and someone needs to stay with them for 24 hours.   SURGICAL WAITING ROOM VISITATION Patients having surgery or a procedure may have no more than 2 support people in the waiting area - these visitors may rotate.   Children under the age of 46 must have an adult with them who is not the patient. If the patient needs to stay at the hospital during part of their recovery, the visitor guidelines for inpatient rooms apply. Pre-op nurse will coordinate an appropriate time for 1 support person to accompany patient in pre-op.  This support person may not rotate.   Please refer  to the [facility] website for the visitor guidelines for Inpatients (after your surgery is over and you are in a regular room).    Special instructions:    Oral Hygiene is also important to reduce your risk of infection.  Remember -  BRUSH YOUR TEETH THE MORNING OF SURGERY WITH YOUR REGULAR TOOTHPASTE   [place]- Preparing For Surgery  Before surgery, you can play an important role. Because skin is not sterile, your skin needs to be as free of germs as possible. You can reduce the number of germs on your skin by washing with CHG (chlorahexidine gluconate) Soap before surgery.  CHG is an antiseptic cleaner which kills germs and bonds with the skin to continue killing germs even after washing.     Please do not use if you have an allergy to CHG or antibacterial soaps. If your skin becomes reddened/irritated stop using the CHG.  Do not shave (including legs and underarms) for at least 48 hours prior to first CHG shower. It is OK to shave your face.  Please follow these instructions carefully.    Shower the NIGHT BEFORE SURGERY and the MORNING OF SURGERY with CHG Soap.  If you chose to wash your hair, wash your hair first as usual with your normal shampoo.  After you shampoo, rinse your hair and body thoroughly to remove the shampoo.   Then [employer] and genitals (private parts) with your normal soap and  rinse thoroughly to remove soap.  After that Use CHG Soap as you would any other liquid soap.  You can apply CHG directly to the skin and wash gently with a scrungie or a clean washcloth.   Apply the CHG Soap to your body ONLY FROM THE NECK DOWN.   Do not use on open wounds or open sores.  Avoid contact with your eyes, ears, mouth and genitals (private parts).  Wash thoroughly, paying special attention to the area where your surgery will be performed.  Thoroughly rinse your body with warm water from the neck down.  DO NOT shower/wash with your normal soap after using and rinsing  off the CHG Soap.  Pat yourself dry with a CLEAN TOWEL.  Wear CLEAN PAJAMAS to bed the night before surgery  Place CLEAN SHEETS on your bed the night before your surgery  DO NOT SLEEP WITH PETS.   Day of Surgery:  Take a shower with CHG soap. Wear Clean/Comfortable clothing the morning of surgery Brush your teeth WITH YOUR REGULAR TOOTHPASTE. Do not wear jewelry. Do not wear lotions, powders, cologne or deodorant. Men may shave face and neck. Do not bring valuables to the hospital.  [facility] is not responsible for any belongings or valuables.   If you received a COVID test during your pre-op visit, it is requested that you wear a mask when out in public, stay away from anyone that may not be feeling well, and notify your surgeon if you develop symptoms. If you have been in contact with anyone that has tested positive in the last 10 days, please notify your surgeon.    Please read over the following fact sheets that you were given.

## 2022-08-15 ENCOUNTER — Other Ambulatory Visit: Payer: Self-pay

## 2022-08-15 ENCOUNTER — Encounter (HOSPITAL_COMMUNITY)
Admission: RE | Admit: 2022-08-15 | Discharge: 2022-08-15 | Disposition: A | Discharge status: Home / Self Care | Payer: No Typology Code available for payment source | Source: Ambulatory Visit | Attending: Thoracic Surgery (Cardiothoracic Vascular Surgery) | Admitting: Thoracic Surgery (Cardiothoracic Vascular Surgery)

## 2022-08-15 ENCOUNTER — Encounter (HOSPITAL_COMMUNITY): Payer: Self-pay

## 2022-08-15 ENCOUNTER — Ambulatory Visit (HOSPITAL_COMMUNITY)
Admission: RE | Admit: 2022-08-15 | Discharge: 2022-08-15 | Disposition: A | Discharge status: Home / Self Care | Payer: No Typology Code available for payment source | Source: Ambulatory Visit | Attending: Thoracic Surgery (Cardiothoracic Vascular Surgery) | Admitting: Thoracic Surgery (Cardiothoracic Vascular Surgery)

## 2022-08-15 VITALS — BP 107/75 | HR 85 | Temp 97.7°F | Resp 18 | Ht 70.0 in | Wt 197.8 lb

## 2022-08-15 DIAGNOSIS — C160 Malignant neoplasm of cardia: Secondary | ICD-10-CM | POA: Insufficient documentation

## 2022-08-15 DIAGNOSIS — Z1152 Encounter for screening for COVID-19: Secondary | ICD-10-CM | POA: Insufficient documentation

## 2022-08-15 DIAGNOSIS — Z01818 Encounter for other preprocedural examination: Secondary | ICD-10-CM | POA: Insufficient documentation

## 2022-08-15 HISTORY — DX: Malignant (primary) neoplasm, unspecified: C80.1

## 2022-08-15 LAB — COMPREHENSIVE METABOLIC PANEL
ALT: 19 U/L (ref 0–44)
AST: 26 U/L (ref 15–41)
Albumin: 3.6 g/dL (ref 3.5–5.0)
Alkaline Phosphatase: 56 U/L (ref 38–126)
Anion gap: 12 (ref 5–15)
BUN: 18 mg/dL (ref 8–23)
CO2: 25 mmol/L (ref 22–32)
Calcium: 9.8 mg/dL (ref 8.9–10.3)
Chloride: 102 mmol/L (ref 98–111)
Creatinine, Ser: 0.9 mg/dL (ref 0.61–1.24)
GFR, Estimated: >60 mL/min (ref >60)
Glucose, Bld: 93 mg/dL (ref 70–99)
Potassium: 4.1 mmol/L (ref 3.5–5.1)
Sodium: 139 mmol/L (ref 135–145)
Total Bilirubin: 0.8 mg/dL (ref 0.3–1.2)
Total Protein: 6.3 g/dL — ABNORMAL LOW (ref 6.5–8.1)

## 2022-08-15 LAB — URINALYSIS, ROUTINE W REFLEX MICROSCOPIC
Bilirubin Urine: NEGATIVE
Glucose, UA: NEGATIVE mg/dL
Hgb urine dipstick: NEGATIVE
Ketones, ur: NEGATIVE mg/dL
Leukocytes,Ua: NEGATIVE
Nitrite: NEGATIVE
Protein, ur: NEGATIVE mg/dL
Specific Gravity, Urine: 1.025 (ref 1.005–1.030)
pH: 5 (ref 5.0–8.0)

## 2022-08-15 LAB — CBC
HCT: 37.3 % — ABNORMAL LOW (ref 39.0–52.0)
Hemoglobin: 12.6 g/dL — ABNORMAL LOW (ref 13.0–17.0)
MCH: 34.4 pg — ABNORMAL HIGH (ref 26.0–34.0)
MCHC: 33.8 g/dL (ref 30.0–36.0)
MCV: 101.9 fL — ABNORMAL HIGH (ref 80.0–100.0)
Platelets: 124 10*3/uL — ABNORMAL LOW (ref 150–400)
RBC: 3.66 MIL/uL — ABNORMAL LOW (ref 4.22–5.81)
RDW: 13.2 % (ref 11.5–15.5)
WBC: 6.3 10*3/uL (ref 4.0–10.5)
nRBC: 0 % (ref 0.0–0.2)

## 2022-08-15 LAB — PROTIME-INR
INR: 1.1 (ref 0.8–1.2)
Prothrombin Time: 14.1 seconds (ref 11.4–15.2)

## 2022-08-15 LAB — SURGICAL PCR SCREEN
MRSA, PCR: NEGATIVE
Staphylococcus aureus: NEGATIVE

## 2022-08-15 LAB — TYPE AND SCREEN
ABO/RH(D): O NEG
Antibody Screen: NEGATIVE

## 2022-08-15 LAB — APTT: aPTT: 28 seconds (ref 24–36)

## 2022-08-15 NOTE — Progress Notes (Signed)
PCP - Dr. Derek Jack Cardiologist - Denies  PPM/ICD - Denies Device Orders - n/a Rep Notified - n/a  Chest x-ray - 08/15/2022 EKG - 08/15/2022 Stress Test - 07/27/2022 - done at the request of Dr. Kipp Brood ECHO - Denies Cardiac Cath - Denies  Sleep Study - Denies CPAP - n/a  No DM  Last dose of GLP1 agonist- n/a GLP1 instructions: n/a  Blood Thinner Instructions: n/a Aspirin Instructions: n/a  NPO after midnight  COVID TEST- Yes. Result Pending   Anesthesia review: No.  Patient denies shortness of breath, fever, cough and chest pain at PAT appointment   All instructions explained to the patient, with a verbal understanding of the material. Patient agrees to go over the instructions while at home for a better understanding. Patient also instructed to self quarantine after being tested for COVID-19. The opportunity to ask questions was provided.

## 2022-08-16 LAB — SARS CORONAVIRUS 2 (TAT 6-24 HRS): SARS Coronavirus 2: NEGATIVE

## 2022-08-17 ENCOUNTER — Ambulatory Visit: Payer: No Typology Code available for payment source | Admitting: Hematology

## 2022-08-17 DIAGNOSIS — Z483 Aftercare following surgery for neoplasm: Secondary | ICD-10-CM

## 2022-08-17 NOTE — Anesthesia Preprocedure Evaluation (Addendum)
Anesthesia Evaluation  Patient identified by MRN, date of birth, ID band Patient awake    Reviewed: Allergy & Precautions, NPO status , Patient's Chart, lab work & pertinent test results  Airway Mallampati: II  TM Distance: >3 FB Neck ROM: Full    Dental no notable dental hx.    Pulmonary neg pulmonary ROS   Pulmonary exam normal breath sounds clear to auscultation       Cardiovascular Normal cardiovascular exam Rhythm:Regular Rate:Normal  Stress 07/2022 .  The study is normal. The study is low risk. .  No ST deviation was noted. .  LV perfusion is normal. There is no evidence of ischemia. There is no evidence of infarction. .  Left ventricular function is normal. Nuclear stress EF: 60 %. The left ventricular ejection fraction is normal (55-65%). End diastolic cavity size is normal. End systolic cavity size is normal.    Neuro/Psych negative neurological ROS  negative psych ROS   GI/Hepatic negative GI ROS, Neg liver ROS,,,  Endo/Other  negative endocrine ROS    Renal/GU negative Renal ROS     Musculoskeletal negative musculoskeletal ROS (+)    Abdominal   Peds  Hematology negative hematology ROS (+)   Anesthesia Other Findings GE junction carcinoma  Reproductive/Obstetrics                             Anesthesia Physical  Anesthesia Plan  ASA: 3  Anesthesia Plan: General   Post-op Pain Management: Minimal or no pain anticipated and Ofirmev IV (intra-op)*   Induction: Intravenous  PONV Risk Score and Plan: 3 and Treatment may vary due to age or medical condition, Midazolam, Ondansetron and Dexamethasone  Airway Management Planned: Double Lumen EBT and Oral ETT  Additional Equipment: Arterial line  Intra-op Plan:   Post-operative Plan: Possible Post-op intubation/ventilation  Informed Consent: I have reviewed the patients History and Physical, chart, labs and discussed the  procedure including the risks, benefits and alternatives for the proposed anesthesia with the patient or authorized representative who has indicated his/her understanding and acceptance.     Dental advisory given  Plan Discussed with: CRNA  Anesthesia Plan Comments: (2 x PIV,+/- CVL)        Anesthesia Quick Evaluation

## 2022-08-18 ENCOUNTER — Encounter (HOSPITAL_COMMUNITY): Payer: Self-pay | Admitting: Thoracic Surgery (Cardiothoracic Vascular Surgery)

## 2022-08-18 ENCOUNTER — Inpatient Hospital Stay (HOSPITAL_COMMUNITY): Payer: No Typology Code available for payment source

## 2022-08-18 ENCOUNTER — Inpatient Hospital Stay (HOSPITAL_COMMUNITY): Payer: No Typology Code available for payment source | Admitting: Vascular Surgery

## 2022-08-18 ENCOUNTER — Inpatient Hospital Stay (HOSPITAL_COMMUNITY)
Admission: RE | Admit: 2022-08-18 | Discharge: 2022-09-06 | DRG: 326 | Disposition: A | Discharge status: Home Health | Payer: No Typology Code available for payment source | Attending: Thoracic Surgery (Cardiothoracic Vascular Surgery) | Admitting: Thoracic Surgery (Cardiothoracic Vascular Surgery)

## 2022-08-18 ENCOUNTER — Other Ambulatory Visit: Payer: Self-pay

## 2022-08-18 ENCOUNTER — Encounter (HOSPITAL_COMMUNITY)
Admission: RE | Disposition: A | Discharge status: Home / Self Care | Payer: Self-pay | Source: Home / Self Care | Attending: Thoracic Surgery (Cardiothoracic Vascular Surgery)

## 2022-08-18 DIAGNOSIS — Z6826 Body mass index (BMI) 26.0-26.9, adult: Secondary | ICD-10-CM | POA: Diagnosis not present

## 2022-08-18 DIAGNOSIS — Z923 Personal history of irradiation: Secondary | ICD-10-CM

## 2022-08-18 DIAGNOSIS — J90 Pleural effusion, not elsewhere classified: Secondary | ICD-10-CM | POA: Diagnosis not present

## 2022-08-18 DIAGNOSIS — J95811 Postprocedural pneumothorax: Secondary | ICD-10-CM | POA: Diagnosis not present

## 2022-08-18 DIAGNOSIS — C159 Malignant neoplasm of esophagus, unspecified: Principal | ICD-10-CM | POA: Diagnosis present

## 2022-08-18 DIAGNOSIS — Z9221 Personal history of antineoplastic chemotherapy: Secondary | ICD-10-CM

## 2022-08-18 DIAGNOSIS — J869 Pyothorax without fistula: Secondary | ICD-10-CM | POA: Diagnosis not present

## 2022-08-18 DIAGNOSIS — E440 Moderate protein-calorie malnutrition: Secondary | ICD-10-CM | POA: Diagnosis present

## 2022-08-18 DIAGNOSIS — Y733 Surgical instruments, materials and gastroenterology and urology devices (including sutures) associated with adverse incidents: Secondary | ICD-10-CM | POA: Diagnosis not present

## 2022-08-18 DIAGNOSIS — D62 Acute posthemorrhagic anemia: Secondary | ICD-10-CM | POA: Diagnosis not present

## 2022-08-18 DIAGNOSIS — Z9049 Acquired absence of other specified parts of digestive tract: Secondary | ICD-10-CM

## 2022-08-18 DIAGNOSIS — Y838 Other surgical procedures as the cause of abnormal reaction of the patient, or of later complication, without mention of misadventure at the time of the procedure: Secondary | ICD-10-CM | POA: Diagnosis not present

## 2022-08-18 DIAGNOSIS — Z803 Family history of malignant neoplasm of breast: Secondary | ICD-10-CM | POA: Diagnosis not present

## 2022-08-18 DIAGNOSIS — C160 Malignant neoplasm of cardia: Principal | ICD-10-CM | POA: Diagnosis present

## 2022-08-18 DIAGNOSIS — D696 Thrombocytopenia, unspecified: Secondary | ICD-10-CM | POA: Diagnosis not present

## 2022-08-18 DIAGNOSIS — R110 Nausea: Secondary | ICD-10-CM | POA: Diagnosis not present

## 2022-08-18 DIAGNOSIS — K9189 Other postprocedural complications and disorders of digestive system: Secondary | ICD-10-CM | POA: Diagnosis not present

## 2022-08-18 DIAGNOSIS — Z1152 Encounter for screening for COVID-19: Secondary | ICD-10-CM

## 2022-08-18 DIAGNOSIS — E875 Hyperkalemia: Secondary | ICD-10-CM | POA: Diagnosis not present

## 2022-08-18 DIAGNOSIS — J9811 Atelectasis: Secondary | ICD-10-CM | POA: Diagnosis not present

## 2022-08-18 DIAGNOSIS — C155 Malignant neoplasm of lower third of esophagus: Secondary | ICD-10-CM | POA: Diagnosis not present

## 2022-08-18 HISTORY — PX: INTERCOSTAL NERVE BLOCK: SHX5021

## 2022-08-18 HISTORY — PX: ESOPHAGOGASTRODUODENOSCOPY: SHX5428

## 2022-08-18 LAB — BASIC METABOLIC PANEL
Anion gap: 12 (ref 5–15)
BUN: 15 mg/dL (ref 8–23)
CO2: 20 mmol/L — ABNORMAL LOW (ref 22–32)
Calcium: 9.3 mg/dL (ref 8.9–10.3)
Chloride: 106 mmol/L (ref 98–111)
Creatinine, Ser: 1.3 mg/dL — ABNORMAL HIGH (ref 0.61–1.24)
GFR, Estimated: >60 mL/min (ref >60)
Glucose, Bld: 190 mg/dL — ABNORMAL HIGH (ref 70–99)
Potassium: 4.5 mmol/L (ref 3.5–5.1)
Sodium: 138 mmol/L (ref 135–145)

## 2022-08-18 LAB — CBC
HCT: 34.9 % — ABNORMAL LOW (ref 39.0–52.0)
Hemoglobin: 11.8 g/dL — ABNORMAL LOW (ref 13.0–17.0)
MCH: 34.5 pg — ABNORMAL HIGH (ref 26.0–34.0)
MCHC: 33.8 g/dL (ref 30.0–36.0)
MCV: 102 fL — ABNORMAL HIGH (ref 80.0–100.0)
Platelets: 102 10*3/uL — ABNORMAL LOW (ref 150–400)
RBC: 3.42 MIL/uL — ABNORMAL LOW (ref 4.22–5.81)
RDW: 12.7 % (ref 11.5–15.5)
WBC: 10.6 10*3/uL — ABNORMAL HIGH (ref 4.0–10.5)
nRBC: 0 % (ref 0.0–0.2)

## 2022-08-18 LAB — GLUCOSE, CAPILLARY
Glucose-Capillary: 179 mg/dL — ABNORMAL HIGH (ref 70–99)
Glucose-Capillary: 168 mg/dL — ABNORMAL HIGH (ref 70–99)

## 2022-08-18 LAB — ABO/RH: ABO/RH(D): O NEG

## 2022-08-18 SURGERY — ESOPHAGECTOMY, ROBOT-ASSISTED
Anesthesia: General | Site: Chest | Laterality: Right

## 2022-08-18 MED ORDER — DEXAMETHASONE SODIUM PHOSPHATE 10 MG/ML IJ SOLN
INTRAMUSCULAR | Status: AC
  Filled 2022-08-18: qty 1

## 2022-08-18 MED ORDER — LACTATED RINGERS IV SOLN: INTRAVENOUS | Status: DC | PRN

## 2022-08-18 MED ORDER — ORAL CARE MOUTH RINSE: 15.0000 mL | Freq: Once | OROMUCOSAL | Status: DC

## 2022-08-18 MED ORDER — EPHEDRINE 5 MG/ML INJ
INTRAVENOUS | Status: AC
  Filled 2022-08-18: qty 5

## 2022-08-18 MED ORDER — PHENYLEPHRINE HCL-NACL 20-0.9 MG/250ML-% IV SOLN
INTRAVENOUS | Status: DC | PRN
  Administered 2022-08-18: 20 ug/min via INTRAVENOUS

## 2022-08-18 MED ORDER — DEXTROSE 5 % IV SOLN
2.0000 g | Freq: Once | INTRAVENOUS | Status: DC
  Filled 2022-08-18: qty 2

## 2022-08-18 MED ORDER — PHENYLEPHRINE 80 MCG/ML (10ML) SYRINGE FOR IV PUSH (FOR BLOOD PRESSURE SUPPORT)
PREFILLED_SYRINGE | INTRAVENOUS | Status: DC | PRN
  Administered 2022-08-18 (×6): 80 ug via INTRAVENOUS

## 2022-08-18 MED ORDER — ALBUMIN HUMAN 5 % IV SOLN: INTRAVENOUS | Status: DC | PRN

## 2022-08-18 MED ORDER — MEPERIDINE HCL 25 MG/ML IJ SOLN: 6.2500 mg | INTRAMUSCULAR | Status: DC | PRN

## 2022-08-18 MED ORDER — CHLORHEXIDINE GLUCONATE 0.12 % MT SOLN
15.0000 mL | OROMUCOSAL | Status: AC
  Administered 2022-08-18: 15 mL via OROMUCOSAL
  Filled 2022-08-18: qty 15

## 2022-08-18 MED ORDER — MORPHINE SULFATE (PF) 2 MG/ML IV SOLN
1.0000 mg | INTRAVENOUS | Status: DC | PRN
  Administered 2022-08-18: 1 mg via INTRAVENOUS
  Administered 2022-08-19 (×3): 3 mg via INTRAVENOUS
  Administered 2022-08-19: 4 mg via INTRAVENOUS
  Administered 2022-08-19: 2 mg via INTRAVENOUS
  Administered 2022-08-20 (×3): 4 mg via INTRAVENOUS
  Administered 2022-08-20: 3 mg via INTRAVENOUS
  Administered 2022-08-20: 4 mg via INTRAVENOUS
  Administered 2022-08-25 – 2022-08-29 (×2): 2 mg via INTRAVENOUS
  Filled 2022-08-18: qty 2
  Filled 2022-08-18: qty 1
  Filled 2022-08-18: qty 2
  Filled 2022-08-18: qty 1
  Filled 2022-08-18: qty 2
  Filled 2022-08-18 (×2): qty 1
  Filled 2022-08-18: qty 2
  Filled 2022-08-18: qty 1
  Filled 2022-08-18 (×2): qty 2
  Filled 2022-08-18: qty 1
  Filled 2022-08-18 (×3): qty 2

## 2022-08-18 MED ORDER — CHLORHEXIDINE GLUCONATE 0.12 % MT SOLN: 15.0000 mL | Freq: Once | OROMUCOSAL | Status: DC

## 2022-08-18 MED ORDER — SODIUM CHLORIDE 0.9 % IV SOLN
2.0000 g | Freq: Four times a day (QID) | INTRAVENOUS | Status: AC
  Administered 2022-08-18 – 2022-08-19 (×3): 2 g via INTRAVENOUS
  Filled 2022-08-18 (×3): qty 2

## 2022-08-18 MED ORDER — BUPIVACAINE HCL (PF) 0.5 % IJ SOLN
INTRAMUSCULAR | Status: AC
  Filled 2022-08-18: qty 10

## 2022-08-18 MED ORDER — ENOXAPARIN SODIUM 40 MG/0.4ML IJ SOSY
40.0000 mg | PREFILLED_SYRINGE | Freq: Every day | INTRAMUSCULAR | Status: DC
  Administered 2022-08-19 – 2022-08-29 (×11): 40 mg via SUBCUTANEOUS
  Filled 2022-08-18 (×11): qty 0.4

## 2022-08-18 MED ORDER — LACTATED RINGERS IV SOLN: INTRAVENOUS | Status: DC

## 2022-08-18 MED ORDER — PROMETHAZINE HCL 25 MG/ML IJ SOLN: 6.2500 mg | INTRAMUSCULAR | Status: DC | PRN

## 2022-08-18 MED ORDER — ONDANSETRON HCL 4 MG/2ML IJ SOLN
INTRAMUSCULAR | Status: DC | PRN
  Administered 2022-08-18: 4 mg via INTRAVENOUS

## 2022-08-18 MED ORDER — ONDANSETRON HCL 4 MG/2ML IJ SOLN
INTRAMUSCULAR | Status: AC
  Filled 2022-08-18: qty 2

## 2022-08-18 MED ORDER — ROCURONIUM BROMIDE 10 MG/ML (PF) SYRINGE
PREFILLED_SYRINGE | INTRAVENOUS | Status: DC | PRN
  Administered 2022-08-18 (×4): 20 mg via INTRAVENOUS
  Administered 2022-08-18: 100 mg via INTRAVENOUS
  Administered 2022-08-18 (×2): 20 mg via INTRAVENOUS

## 2022-08-18 MED ORDER — BUPIVACAINE HCL (PF) 0.25 % IJ SOLN
INTRAMUSCULAR | Status: AC
  Filled 2022-08-18: qty 30

## 2022-08-18 MED ORDER — SUGAMMADEX SODIUM 200 MG/2ML IV SOLN
INTRAVENOUS | Status: DC | PRN
  Administered 2022-08-18: 200 mg via INTRAVENOUS

## 2022-08-18 MED ORDER — ONDANSETRON HCL 4 MG/2ML IJ SOLN: 4.0000 mg | INTRAMUSCULAR | Status: DC | PRN

## 2022-08-18 MED ORDER — ROCURONIUM BROMIDE 10 MG/ML (PF) SYRINGE
PREFILLED_SYRINGE | INTRAVENOUS | Status: AC
  Filled 2022-08-18: qty 20

## 2022-08-18 MED ORDER — PROPOFOL 10 MG/ML IV BOLUS
INTRAVENOUS | Status: AC
  Filled 2022-08-18: qty 20

## 2022-08-18 MED ORDER — ALBUTEROL SULFATE (2.5 MG/3ML) 0.083% IN NEBU: 2.5000 mg | INHALATION_SOLUTION | Freq: Four times a day (QID) | RESPIRATORY_TRACT | Status: AC | PRN

## 2022-08-18 MED ORDER — HYDROMORPHONE HCL 1 MG/ML IJ SOLN
INTRAMUSCULAR | Status: AC
  Filled 2022-08-18: qty 1

## 2022-08-18 MED ORDER — LIDOCAINE 2% (20 MG/ML) 5 ML SYRINGE
INTRAMUSCULAR | Status: DC | PRN
  Administered 2022-08-18: 80 mg via INTRAVENOUS

## 2022-08-18 MED ORDER — DEXAMETHASONE SODIUM PHOSPHATE 10 MG/ML IJ SOLN
INTRAMUSCULAR | Status: DC | PRN
  Administered 2022-08-18: 10 mg via INTRAVENOUS

## 2022-08-18 MED ORDER — PHENYLEPHRINE HCL-NACL 20-0.9 MG/250ML-% IV SOLN: INTRAVENOUS | Status: DC | PRN

## 2022-08-18 MED ORDER — ACETAMINOPHEN 10 MG/ML IV SOLN
INTRAVENOUS | Status: DC | PRN
  Administered 2022-08-18: 1000 mg via INTRAVENOUS

## 2022-08-18 MED ORDER — DEXMEDETOMIDINE HCL IN NACL 400 MCG/100ML IV SOLN: 0.0000 ug/kg/h | INTRAVENOUS | Status: DC

## 2022-08-18 MED ORDER — SUFENTANIL CITRATE 50 MCG/ML IV SOLN
INTRAVENOUS | Status: AC
  Filled 2022-08-18: qty 1

## 2022-08-18 MED ORDER — MIDAZOLAM HCL 2 MG/2ML IJ SOLN
INTRAMUSCULAR | Status: AC
  Filled 2022-08-18: qty 2

## 2022-08-18 MED ORDER — PANTOPRAZOLE SODIUM 40 MG IV SOLR
40.0000 mg | Freq: Two times a day (BID) | INTRAVENOUS | Status: DC
  Administered 2022-08-18 – 2022-09-06 (×38): 40 mg via INTRAVENOUS
  Filled 2022-08-18 (×38): qty 10

## 2022-08-18 MED ORDER — ACETAMINOPHEN 10 MG/ML IV SOLN
INTRAVENOUS | Status: AC
  Filled 2022-08-18: qty 100

## 2022-08-18 MED ORDER — INSULIN ASPART 100 UNIT/ML IJ SOLN
0.0000 [IU] | INTRAMUSCULAR | Status: DC
  Administered 2022-08-18 (×2): 4 [IU] via SUBCUTANEOUS
  Administered 2022-08-19 – 2022-08-24 (×20): 2 [IU] via SUBCUTANEOUS
  Administered 2022-08-24 – 2022-08-25 (×2): 4 [IU] via SUBCUTANEOUS
  Administered 2022-08-25: 2 [IU] via SUBCUTANEOUS
  Administered 2022-08-26: 8 [IU] via SUBCUTANEOUS
  Administered 2022-08-26: 4 [IU] via SUBCUTANEOUS
  Administered 2022-08-26: 2 [IU] via SUBCUTANEOUS
  Administered 2022-08-26 – 2022-08-27 (×3): 4 [IU] via SUBCUTANEOUS
  Administered 2022-08-27 (×2): 2 [IU] via SUBCUTANEOUS
  Administered 2022-08-27: 4 [IU] via SUBCUTANEOUS
  Administered 2022-08-28 – 2022-08-29 (×4): 2 [IU] via SUBCUTANEOUS

## 2022-08-18 MED ORDER — CHLORHEXIDINE GLUCONATE 0.12 % MT SOLN
OROMUCOSAL | Status: AC
  Filled 2022-08-18: qty 15

## 2022-08-18 MED ORDER — BUPIVACAINE HCL (PF) 0.5 % IJ SOLN
INTRAMUSCULAR | Status: AC
  Filled 2022-08-18: qty 30

## 2022-08-18 MED ORDER — SUFENTANIL CITRATE 50 MCG/ML IV SOLN
INTRAVENOUS | Status: DC | PRN
  Administered 2022-08-18: 5 ug via INTRAVENOUS
  Administered 2022-08-18 (×3): 10 ug via INTRAVENOUS
  Administered 2022-08-18: 5 ug via INTRAVENOUS
  Administered 2022-08-18: 10 ug via INTRAVENOUS

## 2022-08-18 MED ORDER — SODIUM CHLORIDE 0.9 % IV SOLN
2.0000 g | INTRAVENOUS | Status: AC
  Administered 2022-08-18 (×2): 2 g via INTRAVENOUS
  Filled 2022-08-18 (×2): qty 2

## 2022-08-18 MED ORDER — PROPOFOL 10 MG/ML IV BOLUS
INTRAVENOUS | Status: DC | PRN
  Administered 2022-08-18: 150 mg via INTRAVENOUS

## 2022-08-18 MED ORDER — BUPIVACAINE LIPOSOME 1.3 % IJ SUSP
INTRAMUSCULAR | Status: AC
  Filled 2022-08-18: qty 20

## 2022-08-18 MED ORDER — MIDAZOLAM HCL 2 MG/2ML IJ SOLN
INTRAMUSCULAR | Status: DC | PRN
  Administered 2022-08-18: 2 mg via INTRAVENOUS

## 2022-08-18 MED ORDER — 0.9 % SODIUM CHLORIDE (POUR BTL) OPTIME
TOPICAL | Status: DC | PRN
  Administered 2022-08-18: 2000 mL

## 2022-08-18 MED ORDER — LIDOCAINE 2% (20 MG/ML) 5 ML SYRINGE
INTRAMUSCULAR | Status: AC
  Filled 2022-08-18: qty 5

## 2022-08-18 MED ORDER — SODIUM CHLORIDE FLUSH 0.9 % IV SOLN
INTRAVENOUS | Status: DC | PRN
  Administered 2022-08-18: 100 mL

## 2022-08-18 MED ORDER — HYDROMORPHONE HCL 1 MG/ML IJ SOLN
0.2500 mg | INTRAMUSCULAR | Status: DC | PRN
  Administered 2022-08-18 (×4): 0.5 mg via INTRAVENOUS

## 2022-08-18 MED ORDER — PHENYLEPHRINE 80 MCG/ML (10ML) SYRINGE FOR IV PUSH (FOR BLOOD PRESSURE SUPPORT)
PREFILLED_SYRINGE | INTRAVENOUS | Status: AC
  Filled 2022-08-18: qty 10

## 2022-08-18 SURGICAL SUPPLY — 122 items
BLADE CLIPPER SURG (BLADE) ×3 IMPLANT
BLADE SURG 11 STRL SS (BLADE) ×3 IMPLANT
BUTTON OLYMPUS DEFENDO 5 PIECE (MISCELLANEOUS) ×3 IMPLANT
CANISTER SUCT 3000ML PPV (MISCELLANEOUS) ×6 IMPLANT
CANNULA REDUC XI 12-8 STAPL (CANNULA) ×3
CANNULA REDUCER 12-8 DVNC XI (CANNULA) ×3 IMPLANT
CATH THORACIC 28FR (CATHETERS) IMPLANT
CHLORAPREP W/TINT 26 (MISCELLANEOUS) ×6 IMPLANT
CNTNR URN SCR LID CUP LEK RST (MISCELLANEOUS) ×3 IMPLANT
CONN ST 1/4X3/8  BEN (MISCELLANEOUS) ×3
CONN ST 1/4X3/8 BEN (MISCELLANEOUS) ×3 IMPLANT
CONT SPEC 4OZ STRL OR WHT (MISCELLANEOUS) ×3
COVER TIP SHEARS 8 DVNC (MISCELLANEOUS) IMPLANT
COVER TIP SHEARS 8MM DA VINCI (MISCELLANEOUS) ×3
DEFOGGER SCOPE WARMER CLEARIFY (MISCELLANEOUS) ×3 IMPLANT
DERMABOND ADVANCED .7 DNX12 (GAUZE/BANDAGES/DRESSINGS) ×6 IMPLANT
DEVICE SUTURE ENDOST 10MM (ENDOMECHANICALS) IMPLANT
DRAIN CHANNEL 19F RND (DRAIN) IMPLANT
DRAIN CONNECTOR BLAKE 1:1 (MISCELLANEOUS) IMPLANT
DRAIN PENROSE 1/2X12 LTX STRL (WOUND CARE) IMPLANT
DRAPE ARM DVNC X/XI (DISPOSABLE) ×12 IMPLANT
DRAPE COLUMN DVNC XI (DISPOSABLE) ×3 IMPLANT
DRAPE CV SPLIT W-CLR ANES SCRN (DRAPES) ×6 IMPLANT
DRAPE DA VINCI XI ARM (DISPOSABLE) ×12
DRAPE DA VINCI XI COLUMN (DISPOSABLE) ×3
DRAPE INCISE IOBAN 66X45 STRL (DRAPES) ×6 IMPLANT
DRAPE ORTHO SPLIT 77X108 STRL (DRAPES) ×6
DRAPE SURG ORHT 6 SPLT 77X108 (DRAPES) ×6 IMPLANT
ELECT REM PT RETURN 9FT ADLT (ELECTROSURGICAL) ×3
ELECTRODE REM PT RTRN 9FT ADLT (ELECTROSURGICAL) ×3 IMPLANT
EVACUATOR SILICONE 100CC (DRAIN) IMPLANT
GAUZE KITTNER 4X5 RF (MISCELLANEOUS) ×6 IMPLANT
GAUZE SPONGE 4X4 12PLY STRL (GAUZE/BANDAGES/DRESSINGS) ×3 IMPLANT
GLOVE BIO SURGEON STRL SZ7 (GLOVE) ×3 IMPLANT
GLOVE BIO SURGEON STRL SZ7.5 (GLOVE) ×12 IMPLANT
GLOVE BIOGEL PI IND STRL 6.5 (GLOVE) IMPLANT
GOWN STRL REUS W/ TWL LRG LVL3 (GOWN DISPOSABLE) ×6 IMPLANT
GOWN STRL REUS W/ TWL XL LVL3 (GOWN DISPOSABLE) ×6 IMPLANT
GOWN STRL REUS W/TWL LRG LVL3 (GOWN DISPOSABLE) ×12
GOWN STRL REUS W/TWL XL LVL3 (GOWN DISPOSABLE) ×6
GRASPER SUT TROCAR 14GX15 (MISCELLANEOUS) ×3 IMPLANT
HEMOSTAT SURGICEL 2X14 (HEMOSTASIS) IMPLANT
IRRIGATOR SUCT 8 DISP DVNC XI (IRRIGATION / IRRIGATOR) IMPLANT
IRRIGATOR SUCTION 8MM XI DISP (IRRIGATION / IRRIGATOR) ×3
IV NS 1000ML (IV SOLUTION)
IV NS 1000ML BAXH (IV SOLUTION) IMPLANT
KIT BASIN OR (CUSTOM PROCEDURE TRAY) ×3 IMPLANT
KIT DILATOR VASC 18G NDL (KITS) ×3 IMPLANT
KIT TUBE JEJUNAL 16FR (CATHETERS) IMPLANT
KIT TURNOVER KIT B (KITS) ×3 IMPLANT
MARKER SKIN DUAL TIP RULER LAB (MISCELLANEOUS) ×3 IMPLANT
NS IRRIG 1000ML POUR BTL (IV SOLUTION) ×6 IMPLANT
OBTURATOR OPTICAL STANDARD 8MM (TROCAR) ×3
OBTURATOR OPTICAL STND 8 DVNC (TROCAR) ×3
OBTURATOR OPTICALSTD 8 DVNC (TROCAR) ×3 IMPLANT
OIL SILICONE PENTAX (PARTS (SERVICE/REPAIRS)) IMPLANT
PACK CHEST (CUSTOM PROCEDURE TRAY) ×3 IMPLANT
PACK LAPAROSCOPY I 1258 (SET/KITS/TRAYS/PACK) ×3 IMPLANT
PAD ARMBOARD 7.5X6 YLW CONV (MISCELLANEOUS) ×6 IMPLANT
PORT ACCESS TROCAR AIRSEAL 12 (TROCAR) IMPLANT
PORT ACCESS TROCAR AIRSEAL 5M (TROCAR) ×3
RELOAD STAPLE 45 2.5 WHT DVNC (STAPLE) IMPLANT
RELOAD STAPLE 45 3.5 BLU DVNC (STAPLE) IMPLANT
RELOAD STAPLE 45 4.3 GRN DVNC (STAPLE) IMPLANT
RELOAD STAPLER 2.5X45 WHT DVNC (STAPLE) ×6 IMPLANT
RELOAD STAPLER 3.5X45 BLU DVNC (STAPLE) ×3 IMPLANT
RELOAD STAPLER 4.3X45 GRN DVNC (STAPLE) ×30 IMPLANT
RETRACTOR WOUND ALXS 19CM XSML (INSTRUMENTS) ×3 IMPLANT
RTRCTR WOUND ALEXIS 19CM XSML (INSTRUMENTS) ×3
SEAL CANN UNIV 5-8 DVNC XI (MISCELLANEOUS) ×12 IMPLANT
SEAL XI 5MM-8MM UNIVERSAL (MISCELLANEOUS) ×12
SEALER SYNCHRO 8 IS4000 DV (MISCELLANEOUS) ×3
SEALER SYNCHRO 8 IS4000 DVNC (MISCELLANEOUS) ×3 IMPLANT
SET GASTRO INIT PLACEMENT MED (SET/KITS/TRAYS/PACK) ×3 IMPLANT
SET TRI-LUMEN FLTR TB AIRSEAL (TUBING) ×3 IMPLANT
SLEEVE ENDOPATH XCEL 5M (ENDOMECHANICALS) ×3 IMPLANT
STAPLER 45 DA VINCI SURE FORM (STAPLE) ×6
STAPLER 45 SUREFORM DVNC (STAPLE) IMPLANT
STAPLER CANNULA SEAL DVNC XI (STAPLE) ×3 IMPLANT
STAPLER CANNULA SEAL XI (STAPLE) ×3
STAPLER RELOAD 2.5X45 WHITE (STAPLE) ×6
STAPLER RELOAD 2.5X45 WHT DVNC (STAPLE) ×6
STAPLER RELOAD 3.5X45 BLU DVNC (STAPLE) ×3
STAPLER RELOAD 3.5X45 BLUE (STAPLE) ×3
STAPLER RELOAD 4.3X45 GREEN (STAPLE) ×30
STAPLER RELOAD 4.3X45 GRN DVNC (STAPLE) ×30
STOPCOCK 4 WAY LG BORE MALE ST (IV SETS) ×3 IMPLANT
SUT ETHIBOND 0 36 GRN (SUTURE) IMPLANT
SUT ETHILON 1 TP 1 60 (SUTURE) IMPLANT
SUT SILK  1 MH (SUTURE) ×9
SUT SILK 1 MH (SUTURE) ×3 IMPLANT
SUT SILK 2 0 SH (SUTURE) ×3 IMPLANT
SUT SURGIDAC NAB ES-9 0 48 120 (SUTURE) IMPLANT
SUT V-LOC BARB 180 2/0GR6 GS22 (SUTURE) ×6
SUT VIC AB 2-0 CT1 27 (SUTURE) ×3
SUT VIC AB 2-0 CT1 36 (SUTURE) IMPLANT
SUT VIC AB 2-0 CT1 TAPERPNT 27 (SUTURE) IMPLANT
SUT VIC AB 3-0 SH 27 (SUTURE) ×24
SUT VIC AB 3-0 SH 27X BRD (SUTURE) ×12 IMPLANT
SUT VIC AB 3-0 SH 8-18 (SUTURE) ×3 IMPLANT
SUT VICRYL 0 UR6 27IN ABS (SUTURE) ×12 IMPLANT
SUTURE V-LC BRB 180 2/0GR6GS22 (SUTURE) IMPLANT
SYR 10ML LL (SYRINGE) ×3 IMPLANT
SYR 20ML ECCENTRIC (SYRINGE) ×3 IMPLANT
SYR 20ML LL LF (SYRINGE) ×3 IMPLANT
SYR 50ML LL SCALE MARK (SYRINGE) ×3 IMPLANT
SYSTEM RETRIEVAL ANCHOR 12 (MISCELLANEOUS) IMPLANT
SYSTEM SAHARA CHEST DRAIN ATS (WOUND CARE) ×3 IMPLANT
TAPE CLOTH 4X10 WHT NS (GAUZE/BANDAGES/DRESSINGS) IMPLANT
TAPE CLOTH SURG 4X10 WHT LF (GAUZE/BANDAGES/DRESSINGS) IMPLANT
TOWEL GREEN STERILE (TOWEL DISPOSABLE) ×6 IMPLANT
TOWEL GREEN STERILE FF (TOWEL DISPOSABLE) ×6 IMPLANT
TRAY FOLEY MTR SLVR 16FR STAT (SET/KITS/TRAYS/PACK) ×3 IMPLANT
TROCAR XCEL BLADELESS 5X75MML (TROCAR) ×3 IMPLANT
TROCAR XCEL NON-BLD 5MMX100MML (ENDOMECHANICALS) ×3 IMPLANT
TUBE CONNECTING 20X1/4 (TUBING) ×3 IMPLANT
TUBING ENDO SMARTCAP (MISCELLANEOUS) ×3 IMPLANT
TUBING EXTENTION W/L.L. (IV SETS) ×3 IMPLANT
TUBING LAP HI FLOW INSUFFLATIO (TUBING) ×3 IMPLANT
UNDERPAD 30X36 HEAVY ABSORB (UNDERPADS AND DIAPERS) ×3 IMPLANT
WATER STERILE IRR 1000ML POUR (IV SOLUTION) ×6 IMPLANT
WIRE EMERALD 3MM-J .035X150CM (WIRE) ×3 IMPLANT

## 2022-08-18 NOTE — Anesthesia Procedure Notes (Signed)
Procedure Name: Intubation Date/Time: 08/18/2022 8:00 AM  Performed by: Anastasio Auerbach, CRNAPre-anesthesia Checklist: Patient identified, Emergency Drugs available, Suction available and Patient being monitored Patient Re-evaluated:Patient Re-evaluated prior to induction Oxygen Delivery Method: Circle system utilized Preoxygenation: Pre-oxygenation with 100% oxygen Induction Type: IV induction Ventilation: Mask ventilation without difficulty Laryngoscope Size: Mac and 3 Grade View: Grade I Tube type: Oral Number of attempts: 1 Airway Equipment and Method: Stylet and Oral airway Placement Confirmation: ETT inserted through vocal cords under direct vision, positive ETCO2 and breath sounds checked- equal and bilateral Secured at: 23 cm Tube secured with: Tape Dental Injury: Teeth and Oropharynx as per pre-operative assessment

## 2022-08-18 NOTE — Anesthesia Procedure Notes (Signed)
Arterial Line Insertion Start/End11/11/2021 7:00 AM Performed by: Anastasio Auerbach, CRNA, CRNA  Patient location: Pre-op. Preanesthetic checklist: patient identified, IV checked, site marked, risks and benefits discussed, surgical consent, monitors and equipment checked, pre-op evaluation, timeout performed and anesthesia consent Lidocaine 1% used for infiltration Right, radial was placed Catheter size: 20 G Hand hygiene performed , maximum sterile barriers used  and Seldinger technique used Allen's test indicative of satisfactory collateral circulation Procedure performed without using ultrasound guided technique. Following insertion, Biopatch and dressing applied. Post procedure assessment: normal  Patient tolerated the procedure well with no immediate complications.

## 2022-08-18 NOTE — Interval H&P Note (Signed)
History and Physical Interval Note:  08/18/2022 7:43 AM  [name] [name]  has presented today for surgery, with the diagnosis of ESOPHAGEAL CANCER.  The various methods of treatment have been discussed with the patient and family. After consideration of risks, benefits and other options for treatment, the patient has consented to  Procedure(s): XI ROBOTIC ASSISTED IVOR LEWIS ESOPHAGECTOMY (N/A) XI ROBOTIC ASSISTED JEJUNOSTOMY TUBE PLACEMENT (N/A) ESOPHAGOGASTRODUODENOSCOPY (EGD) (N/A) as a surgical intervention.  The patient's history has been reviewed, patient examined, no change in status, stable for surgery.  I have reviewed the patient's chart and labs.  Questions were answered to the patient's satisfaction.     [name]

## 2022-08-18 NOTE — Anesthesia Procedure Notes (Addendum)
Procedure Name: Intubation Date/Time: 08/18/2022 12:08 PM  Performed by: Anastasio Auerbach, CRNAPre-anesthesia Checklist: Patient identified, Emergency Drugs available, Suction available and Patient being monitored Patient Re-evaluated:Patient Re-evaluated prior to induction Oxygen Delivery Method: Circle system utilized Preoxygenation: Pre-oxygenation with 100% oxygen Induction Type: Inhalational induction with existing ETT Laryngoscope Size: Mac and 3 Grade View: Grade I Tube type: Oral Endobronchial tube: Left and Double lumen EBT and 39 Fr Number of attempts: 1 Airway Equipment and Method: Stylet and Fiberoptic brochoscope Placement Confirmation: ETT inserted through vocal cords under direct vision, positive ETCO2 and breath sounds checked- equal and bilateral Secured at: 30 cm Tube secured with: Tape Dental Injury: Teeth and Oropharynx as per pre-operative assessment

## 2022-08-18 NOTE — Hospital Course (Addendum)
History of Present Illness:    [name] [name] 61 y.o. male referred by Dr. 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  He recently presented with dysphagia and intentional weight loss in April 2023.  Subsequently this restriction.  He subsequently underwent upper endoscopy with biopsy along with endoscopic ultrasound which confirmed the diagnosis.  He is able to tolerate a regular diet with occasional dysphagia.  He has started an exercise program follow-up last year and has intentional been losing weight.   He completed his neoadjuvant chemotherapy and radiation a month ago.  He comes in today to discuss results from his PET/CT.  He is able to tolerate a diet and has been able to put on some weight.  Hospital course:  The patient was admitted electively on 08/18/2022 at which time he underwent robotic assisted Ivor Lewis esophagectomy and placement of jejunostomy tube.  Patient tolerated procedure well and was taken to the postanesthesia care unit in stable condition.  He was kept n.p.o. and supported with tube feeding by way of the jejunostomy tube that was gradually advanced to the goal rate and well-tolerated.  CBGs were checked because of tube feedings. He had significant pain in the early days after surgery that improved significantly with the addition of a PCA pump.  DVT prophylaxis was managed with mobilization and daily subcutaneous enoxaparin.  The initial checks x-ray showed mild and expected free intra-abdominal air.  By the third postoperative day, this was improving radiographically. He tolerated tube feedings and these were up to 60 ml/hr as of 11/06. NG tube was removed on 11/06. Esophagram was done on 11/07 and showed no evidence or post op leak. He was put on a Dysphagia I diet, which he tolerated. Tube feedings were then changed to night time only so as to encourage him to take po during the day. He had thrombocytopenia post op. Platelets  on 11/07 were up to 117,000. He became more tachycardic, had a fever to 100 and was profusely sweating on 11/09. Initially, in the am we were going to discharge him;however, this was not done to the aforementioned. CBC on 11/10 showed WBC 16,600. He was started on Zosyn. Chest x ray showed no pneumothorax, cardiomegaly, bibasilar airspace disease increased from previously with probable small pleural effusions.  His JP drain developed brown milky drainage.  He was made NPO accept for ice chips and sips of liquid.  His tube feeds were resumed in a continuous fashion.  It was felt there was a high probability of anastomotic leak.  CT of the chest and abdomen was obtained with oral contrast and showed defect at esophageal-gastric anastomosis that communicates with right pleural space and secondary moderate volume, multifocal right-sided loculated .  Diflucan was started. The patient developed abdominal fullness, nausea, and persistent coughing.  His tube feeds were reduced. He returned to the OR on 11/13 in order to undergo an EGD, stent, and right VATS. WBC decreased to 8500 and he was no longer tachycardic. He had hyperkalemia on 11/15 and was given Lokelma via J tube. He was given it again as potassium was 5.1 on 11/15. Nutrition evaluated and recommendations were followed accordingly. Esophagram was done on 11/16 and showed a persistent esophageal leak with contrast entering the stomach and then appearing to migrate proximally along the outer stent wall and through the previously identified anastomotic defect into the right hemithorax. Patient remained NPO and tube feedings were continued. Pre albumin is 10 and albumin was  checked on 11/20 and it was 1.7. Patient wanting to go home. PICC line was placed on 11/20 as he needs to continue IV antibiotics for esophageal leak. He was instructed to remain NPO, tube feedings to be arranged, HHRN. All wounds are clean, dry, healing without signs of infection. He has had good  oxygenation on room air. He is felt surgically stable for discharge today.

## 2022-08-18 NOTE — Transfer of Care (Signed)
Immediate Anesthesia Transfer of Care Note  Patient: [name] [name]  Procedure(s) Performed: XI ROBOTIC ASSISTED IVOR LEWIS ESOPHAGECTOMY (Chest) XI ROBOTIC ASSISTED JEJUNOSTOMY TUBE PLACEMENT ESOPHAGOGASTRODUODENOSCOPY (EGD) INTERCOSTAL NERVE BLOCK (Right: Chest)  Patient Location: PACU  Anesthesia Type:General  Level of Consciousness: awake, alert , and oriented  Airway & Oxygen Therapy: Patient Spontanous Breathing and Patient connected to nasal cannula oxygen  Post-op Assessment: Report given to RN and Post -op Vital signs reviewed and stable  Post vital signs: Reviewed and stable  Last Vitals:  Vitals Value Taken Time  BP 126/82 08/18/22 1513  Temp    Pulse 114 08/18/22 1516  Resp 17 08/18/22 1516  SpO2 95 % 08/18/22 1516  Vitals shown include unvalidated device data.  Last Pain:  Vitals:   08/18/22 0620  PainSc: 0-No pain      Patients Stated Pain Goal: 0 (82/64/15 8309)  Complications: No notable events documented.

## 2022-08-18 NOTE — Brief Op Note (Signed)
08/18/2022  2:25 PM  PATIENT:  [name] [name]  61 y.o. male  PRE-OPERATIVE DIAGNOSIS:  ESOPHAGEAL CANCER  POST-OPERATIVE DIAGNOSIS:  ESOPHAGEAL CANCER  PROCEDURE:  Procedure(s): XI ROBOTIC ASSISTED IVOR LEWIS ESOPHAGECTOMY (N/A) XI ROBOTIC ASSISTED JEJUNOSTOMY TUBE PLACEMENT (N/A) ESOPHAGOGASTRODUODENOSCOPY (EGD) (N/A) INTERCOSTAL NERVE BLOCK (Right)  SURGEON:  Surgeon(s) and Role:    * Lightfoot, Lucile Crater, MD - Primary  PHYSICIAN ASSISTANT: Autymn Omlor PA-C  ASSISTANTS: none   ANESTHESIA:   general  EBL:  100 mL   BLOOD ADMINISTERED:none  DRAINS:  28 F CHEST TUBE AND 19 BLAKE DRAIN IN RIGHT CHEST    LOCAL MEDICATIONS USED:  MARCAINE    and OTHER EXPAREL  SPECIMEN:  Source of Specimen:  ESOPHAGOGASTRECTOMY  DISPOSITION OF SPECIMEN:  PATHOLOGY  COUNTS:  YES  TOURNIQUET:  * No tourniquets in log *  DICTATION: .Dragon Dictation  PLAN OF CARE: Admit to inpatient   PATIENT DISPOSITION:  PACU - hemodynamically stable.   Delay start of Pharmacological VTE agent (>24hrs) due to surgical blood loss or risk of bleeding: yes  COMPLICATIONS: NO KNOWN

## 2022-08-18 NOTE — Op Note (Signed)
[address]       [place] [address]             [phone]        08/18/2022  Patient:  [name] Pre-Op Dx: Distal esophageal cancer   Post-op Dx:  same Procedure: - Esophagoscopy - Robotic assisted laparoscopy - Robotic assisted thoracoscopy - Ivor-Lewis esophagectomy - Pyloromyotomy - Laparoscopic jejunostomy tube placement 72F - Intercostal nerve block   Surgeon and Role:      * Hill Mackie, Lucile Crater, MD - Primary  Assistant: Evonnie Pat, PA-C  An experienced assistant was required given the complexity of this surgery and the standard of surgical care. The assistant was needed for exposure, dissection, suctioning, retraction of delicate tissues and sutures, instrument exchange and for overall help during this procedure.   Anesthesia  general EBL:  235m Blood Administration: none Specimen:  esophagogastrectomy   Counts: correct   Indications: 61year old male with T3 N0 M0 GE junction adenocarcinoma.  Based on its location its spans from 27 cm to 42 cm on endoscopy.  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  He recently completed his radiation a month ago.  We discussed the risks and benefits of an EGD, robotic assisted Ivor Lewis esophagectomy, and jejunostomy tube placement.  He is agreeable to proceed.  He is scheduled for early November.    Findings: Normal anatomy.    Operative Technique: After the risks, benefits and alternatives were thoroughly discussed, the patient was brought to the operative theatre.  Anesthesia was induced, and the esophagoscope was passed through the oropharynx down to the stomach.  The scope was retroflexed.  On retrograde examination of the esophagus, there was good response to neoadjuvant therapy.  The scope was then parked at 25 cm from the incisors.  The patient was then prepped and draped in normal sterile fashion.  An appropriate surgical pause was  performed, and pre-operative antibiotics were dosed accordingly.  We began with a 1 cm incision 15 cm caudad from the xiphoid and slightly lateral to the umbilicus.  Using an Optiview we entered the peritoneal space.  The abdomen was then insufflated with CO2.  3 other robotic ports were placed to triangulate the hiatus.  Another 12 mm port was placed in place at the level of the umbilicus laterally for an assistant port and another 5 mm trocar was placed in the right lower quadrant for liver retractor.  The patient was then placed in steep reverse Trendelenburg and the liver was elevated to expose the esophageal hiatus.  And then the robot was docked.  We began by dividing the gastrohepatic ligament to expose the right diaphragmatic crus and then dissected the esophagus into the mediastinum.  We then divided the short gastrics and moved towards the right crus and completed our dissection along the esophageal hiatus.  A Penrose drain was then used to encircle the the esophagus and we continued our dissection up into the mediastinum.  The stomach was then retracted superiorly, and we mobilized it off of the pancreas.  The left gastric artery was then isolated and divided with a robotic stabler.  ICG was then injected through his central line, and good blood flow to the stomach was evident.  We then marked an area away from the right gastroepiploic artery, and began to divide the omentum.    Once we achieved good mobilization, we focused our attention on  the pylorus.  A 3cm longitudinal incision.  The muscle was divided and the mucosa was evident.  The myotomy was then covered with omentum.  We then began to tubularized the gastric conduit with several fires of the robotic stapler.  Once complete, the conduit was then attached to the distal end of the specimen.  We then undocked the robot, after removing all instruments, and the liver retractor, and then focused our attention placement of the jejunostomy tube.   The ligament of Treitz was identified, and a portion of small bowel about 20-30cm distal was used.  Corner stiches were placed, and passed out through the abdominal wall.  Using Seldinger technique, we access the small bowel and confirmed its position with insufflation.  The tract was sequentially dilated, and we passed a 77F jejunostomy tube distally.  The sutures were secured, and another proximal stitch was placed to prevent torsion.  The pneumoperitoneum was released, and all ports were removed.  The incisions were closed with absorbable suture.  The esophagoscope was removed.  The patient was then placed in a left lateral decubitus position.  The robotic ports were placed to triangulate the esophagus.  We continued our mobilization of the esophagus up to the azygous vein.  The azygous vein was divided with a robotic stapler.  The esophagus was then mobilized circumferentially, and then divided at the level of the azygous vein.  We then mobilized the esophagus along with the gastric conduit into the chest.  We ensured that the staple line in the conduit was oriented appropriately.  A small gastrotomy was then made and using the robotic stapler the back row the anastomosis between the gastric conduit and the proximal esophagus was then created.  Then using 2-0 V-Loc sutures the front row of the anastomosis was closed in 2 layers.  A vascularized omental fat pad was then used to buttress the anastomosis.  The ports were removed, and a 26F chest tube was placed.  An intercostal nerve block was performed.  The lung was expanded.  The incisions were closed with absorbable suture.    The patient was then placed back in a supine position and the gastroscope was then inserted through the oropharynx and passed down.  The anastomosis appeared intact.  We then passed the gastroscope down under direct visualization into the gastric conduit by the pylorus.  The patient tolerated the procedure without any immediate  complications, and was transferred to the PACU in stable condition.  [name] [name]

## 2022-08-19 ENCOUNTER — Inpatient Hospital Stay (HOSPITAL_COMMUNITY): Payer: No Typology Code available for payment source

## 2022-08-19 ENCOUNTER — Encounter (HOSPITAL_COMMUNITY): Payer: Self-pay | Admitting: Thoracic Surgery (Cardiothoracic Vascular Surgery)

## 2022-08-19 DIAGNOSIS — E440 Moderate protein-calorie malnutrition: Secondary | ICD-10-CM | POA: Insufficient documentation

## 2022-08-19 LAB — BASIC METABOLIC PANEL
Anion gap: 5 (ref 5–15)
BUN: 18 mg/dL (ref 8–23)
CO2: 27 mmol/L (ref 22–32)
Calcium: 9.4 mg/dL (ref 8.9–10.3)
Chloride: 108 mmol/L (ref 98–111)
Creatinine, Ser: 1.45 mg/dL — ABNORMAL HIGH (ref 0.61–1.24)
GFR, Estimated: 54 mL/min — ABNORMAL LOW (ref >60)
Glucose, Bld: 164 mg/dL — ABNORMAL HIGH (ref 70–99)
Potassium: 5.2 mmol/L — ABNORMAL HIGH (ref 3.5–5.1)
Sodium: 140 mmol/L (ref 135–145)

## 2022-08-19 LAB — GLUCOSE, CAPILLARY
Glucose-Capillary: 122 mg/dL — ABNORMAL HIGH (ref 70–99)
Glucose-Capillary: 124 mg/dL — ABNORMAL HIGH (ref 70–99)
Glucose-Capillary: 126 mg/dL — ABNORMAL HIGH (ref 70–99)
Glucose-Capillary: 129 mg/dL — ABNORMAL HIGH (ref 70–99)
Glucose-Capillary: 129 mg/dL — ABNORMAL HIGH (ref 70–99)
Glucose-Capillary: 135 mg/dL — ABNORMAL HIGH (ref 70–99)
Glucose-Capillary: 150 mg/dL — ABNORMAL HIGH (ref 70–99)

## 2022-08-19 LAB — CBC
HCT: 32.6 % — ABNORMAL LOW (ref 39.0–52.0)
Hemoglobin: 10.9 g/dL — ABNORMAL LOW (ref 13.0–17.0)
MCH: 34.1 pg — ABNORMAL HIGH (ref 26.0–34.0)
MCHC: 33.4 g/dL (ref 30.0–36.0)
MCV: 101.9 fL — ABNORMAL HIGH (ref 80.0–100.0)
Platelets: 106 10*3/uL — ABNORMAL LOW (ref 150–400)
RBC: 3.2 MIL/uL — ABNORMAL LOW (ref 4.22–5.81)
RDW: 12.7 % (ref 11.5–15.5)
WBC: 10.1 10*3/uL (ref 4.0–10.5)
nRBC: 0 % (ref 0.0–0.2)

## 2022-08-19 MED ORDER — PROSOURCE TF20 ENFIT COMPATIBL EN LIQD
60.0000 mL | Freq: Every day | ENTERAL | Status: DC
  Administered 2022-08-19 – 2022-08-22 (×4): 60 mL
  Filled 2022-08-19 (×4): qty 60

## 2022-08-19 MED ORDER — OSMOLITE 1.5 CAL PO LIQD
1000.0000 mL | ORAL | Status: DC
  Administered 2022-08-19 – 2022-08-22 (×3): 1000 mL
  Filled 2022-08-19: qty 1000

## 2022-08-19 MED ORDER — FREE WATER
200.0000 mL | Status: DC
  Administered 2022-08-19 – 2022-08-23 (×24): 200 mL

## 2022-08-19 MED ORDER — ACETAMINOPHEN 10 MG/ML IV SOLN
1000.0000 mg | Freq: Four times a day (QID) | INTRAVENOUS | Status: AC
  Administered 2022-08-19 (×3): 1000 mg via INTRAVENOUS
  Filled 2022-08-19 (×4): qty 100

## 2022-08-19 MED ORDER — PHENYLEPHRINE HCL-NACL 20-0.9 MG/250ML-% IV SOLN
INTRAVENOUS | Status: AC
  Filled 2022-08-19: qty 750

## 2022-08-19 MED ORDER — HYDROCODONE-ACETAMINOPHEN 7.5-325 MG/15ML PO SOLN
10.0000 mL | ORAL | Status: DC | PRN
  Administered 2022-08-19 – 2022-09-02 (×14): 10 mL
  Filled 2022-08-19 (×14): qty 15

## 2022-08-19 NOTE — Plan of Care (Signed)
  Problem: Clinical Measurements: Goal: Respiratory complications will improve Outcome: Progressing Goal: Cardiovascular complication will be avoided Outcome: Progressing   Problem: Nutrition: Goal: Adequate nutrition will be maintained Outcome: Progressing   Problem: Coping: Goal: Level of anxiety will decrease Outcome: Progressing   Problem: Elimination: Goal: Will not experience complications related to urinary retention Outcome: Progressing   Problem: Activity: Goal: Risk for activity intolerance will decrease Outcome: Not Progressing   Problem: Elimination: Goal: Will not experience complications related to bowel motility Outcome: Not Progressing

## 2022-08-19 NOTE — Anesthesia Postprocedure Evaluation (Signed)
Anesthesia Post Note  Patient: [name] [name]  Procedure(s) Performed: XI ROBOTIC ASSISTED IVOR LEWIS ESOPHAGECTOMY (Chest) XI ROBOTIC ASSISTED JEJUNOSTOMY TUBE PLACEMENT ESOPHAGOGASTRODUODENOSCOPY (EGD) INTERCOSTAL NERVE BLOCK (Right: Chest)     Patient location during evaluation: PACU Anesthesia Type: General Level of consciousness: sedated and patient cooperative Pain management: pain level controlled Vital Signs Assessment: post-procedure vital signs reviewed and stable Respiratory status: spontaneous breathing Cardiovascular status: stable Anesthetic complications: no   No notable events documented.  Last Vitals:  Vitals:   08/19/22 0000 08/19/22 0456  BP:  117/72  Pulse:  97  Resp:  20  Temp: 36.8 C 36.6 C  SpO2:  100%    Last Pain:  Vitals:   08/19/22 0535  TempSrc:   PainSc: Asleep   Pain Goal: Patients Stated Pain Goal: 0 (08/19/22 0456)                 Nolon Nations

## 2022-08-19 NOTE — Progress Notes (Signed)
Initial Nutrition Assessment  DOCUMENTATION CODES:   Non-severe (moderate) malnutrition in context of chronic illness  INTERVENTION:  - Initiate Osmolite 1.5 @ 10 mL/hr; advance 10 mL/hr Q6H to goal of 70 mL/hr.  - Add Prosource TF20 x1  - Free water flush at 200 mL Q4H  This provides the pt with 2600 kcals, 125 gm protein, and 2480 mL free water.   NUTRITION DIAGNOSIS:   Moderate Malnutrition related to chronic illness as evidenced by percent weight loss, mild fat depletion, mild muscle depletion (12% over 5 months).  GOAL:   Patient will meet greater than or equal to 90% of their needs  MONITOR:   Labs, Diet advancement, TF tolerance  REASON FOR ASSESSMENT:   Consult, New TF Enteral/tube feeding initiation and management  ASSESSMENT:   61 y.o. 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 Pt is POD1 from a esophagectomy, pyloromyotomy with laparoscopic J-tube placement.   Meds include: sliding scale insulin. Labs reviewed: K high. FS Glucose: 135-168 mg/dL.   MD consult to initiate J-tube feedings. Pt is currently NPO and has NG tube to suction. Pt reports that he has been eating well PTA. He states that he has even experienced some weight gain recently. Per record, it appears over the past 5 months, the pt has experienced an overall wt loss of about 12% of his total body weight, which is significant. Pt also with some mild muscle and fat wasting.   RD will plan to initiate the tube feeding regimen as stated above. Will continue to monitor TF tolerance and adjust as needed if diet is advanced and pt is able to tolerate.   NUTRITION - FOCUSED PHYSICAL EXAM:  Flowsheet Row Most Recent Value  Orbital Region Mild depletion  Upper Arm Region No depletion  Thoracic and Lumbar Region Unable to assess  Buccal Region Mild depletion  Temple Region Mild depletion  Clavicle Bone Region Mild  depletion  Clavicle and Acromion Bone Region Mild depletion  Scapular Bone Region Unable to assess  Dorsal Hand No depletion  Patellar Region No depletion  Anterior Thigh Region No depletion  Posterior Calf Region No depletion  Edema (RD Assessment) None  Hair Reviewed  Eyes Reviewed  Mouth Reviewed  Skin Reviewed  Nails Reviewed       Diet Order:   Diet Order             Diet NPO time specified  Diet effective now                   EDUCATION NEEDS:   Not appropriate for education at this time  Skin:  Skin Assessment: Skin Integrity Issues: Skin Integrity Issues:: Incisions Incisions: Right chest  Last BM:  Unknown  Height:   Ht Readings from Last 1 Encounters:  08/18/22 '5\' 10"'$  (1.778 m)    Weight:   Wt Readings from Last 1 Encounters:  08/18/22 89.8 kg    Ideal Body Weight:  75.5 kg  BMI:  Body mass index is 28.41 kg/m.  Estimated Nutritional Needs:   Kcal:  3212-2482 kcals  Protein:  125-135 gm  Fluid:  >/= 2.5 L  Thalia Bloodgood, RD, LDN, CNSC.

## 2022-08-19 NOTE — Plan of Care (Signed)
  Problem: Clinical Measurements: Goal: Respiratory complications will improve Outcome: Progressing   Problem: Activity: Goal: Risk for activity intolerance will decrease Outcome: Progressing   Problem: Coping: Goal: Level of anxiety will decrease Outcome: Progressing   Problem: Elimination: Goal: Will not experience complications related to urinary retention Outcome: Progressing   Problem: Elimination: Goal: Will not experience complications related to bowel motility Outcome: Not Progressing   Problem: Pain Managment: Goal: General experience of comfort will improve Outcome: Not Progressing

## 2022-08-19 NOTE — Progress Notes (Addendum)
[address]       [place] [address]             [phone]     1 Day Post-Op Procedure(s) (LRB): XI ROBOTIC ASSISTED IVOR LEWIS ESOPHAGECTOMY (N/A) XI ROBOTIC ASSISTED JEJUNOSTOMY TUBE PLACEMENT (N/A) ESOPHAGOGASTRODUODENOSCOPY (EGD) (N/A) INTERCOSTAL NERVE BLOCK (Right) Subjective: Fair pain control, gets to be 8/10 at times   Objective: Vital signs in last 24 hours: Temp:  [97.8 F (36.6 C)-98.3 F (36.8 C)] 97.8 F (36.6 C) (11/03 0456) Pulse Rate:  [97-114] 97 (11/03 0456) Cardiac Rhythm: Normal sinus rhythm (11/02 2041) Resp:  [12-20] 20 (11/03 0456) BP: (100-126)/(67-82) 117/72 (11/03 0456) SpO2:  [95 %-100 %] 100 % (11/03 0456) Arterial Line BP: (96-136)/(52-83) 96/55 (11/02 1600)  Hemodynamic parameters for last 24 hours:    Intake/Output from previous day: 11/02 0701 - 11/03 0700 In: 4476.6 [I.V.:3200; IV Piggyback:1276.6] Out: 1470 [Urine:1150; Drains:90; Blood:100; Chest Tube:130] Intake/Output this shift: No intake/output data recorded.  General appearance: alert, cooperative, and no distress Heart: regular rate and rhythm Lungs: Mildly diminished in the bases Abdomen: Nondistended Extremities: No edema, warm well perfused Wound: Incisions healing well  Lab Results: Recent Labs    08/18/22 1700 08/19/22 0014  WBC 10.6* 10.1  HGB 11.8* 10.9*  HCT 34.9* 32.6*  PLT 102* 106*   BMET:  Recent Labs    08/18/22 1700 08/19/22 0014  NA 138 140  K 4.5 5.2*  CL 106 108  CO2 20* 27  GLUCOSE 190* 164*  BUN 15 18  CREATININE 1.30* 1.45*  CALCIUM 9.3 9.4    PT/INR: No results for input(s): "LABPROT", "INR" in the last 72 hours. ABG No results found for: "PHART", "HCO3", "TCO2", "ACIDBASEDEF", "O2SAT" CBG (last 3)  Recent Labs    08/18/22 2039 08/18/22 2358 08/19/22 0455  GLUCAP 168* 150* 135*    Meds Scheduled Meds:  enoxaparin (LOVENOX) injection  40 mg Subcutaneous Daily   insulin aspart  0-24 Units  Subcutaneous Q4H   pantoprazole (PROTONIX) IV  40 mg Intravenous Q12H   Continuous Infusions: PRN Meds:.albuterol, morphine injection, ondansetron (ZOFRAN) IV  Xrays DG Chest Port 1 View  Result Date: 08/18/2022 CLINICAL DATA:  Prior esophagectomy EXAM: PORTABLE CHEST 1 VIEW COMPARISON:  Chest x-ray dated August 15, 2022 FINDINGS: Right chest wall port unchanged in position. Enteric tube partially seen coursing below the diaphragm. Two right-sided chest tubes. Cardiac and mediastinal contours within normal limits for AP technique. Pneumomediastinum pneumoperitoneum. Left-greater-than-right basilar opacities, likely due to atelectasis. No large pleural effusion evidence of pneumothorax. IMPRESSION: 1. Two right-sided chest tubes in place. No evidence of pneumothorax. 2. Pneumomediastinum and pneumoperitoneum, likely postsurgical. Electronically Signed   By: Yetta Glassman M.D.   On: 08/18/2022 16:00    Assessment/Plan: S/P Procedure(s) (LRB): XI ROBOTIC ASSISTED IVOR LEWIS ESOPHAGECTOMY (N/A) XI ROBOTIC ASSISTED JEJUNOSTOMY TUBE PLACEMENT (N/A) ESOPHAGOGASTRODUODENOSCOPY (EGD) (N/A) INTERCOSTAL NERVE BLOCK (Right) POD #1 1 afebrile, vital signs stable, sinus rhythm, some sinus tachycardia most likely related to pain 2 O2 saturations good on room air 3 good urine output 4 drains 220 cc, keep in place 5 slight increase in creatinine to 1.45, not on Toradol 6 expected acute blood loss anemia, minor, monitor clinically 7 thrombocytopenia, slightly worse than preop, monitor clinically 8 blood sugars adequately controlled-not a diabetic 9 we will obtain nutrition consult for J-tube feeding initiation, keep n.p.o. until swallow study 10 chest x-ray-some free air in the mediastinum and under diaphragm, expected based  on surgical procedure some bibasilar atelectasis 11 continue IV morphine-, will add IV Tylenol for 24 hours, consider PCA short-term if pain remains poorly controlled 12-routine  pulmonary hygiene and rehab modalities      LOS: 1 day    Gaspar Bidding Pager [phone] 08/19/2022   Agree with above Slowly advancing tube feeds CT to water seal Swallow on Tues  Vong Garringer O Dusan Lipford

## 2022-08-19 NOTE — Progress Notes (Signed)
  Transition of Care [facility]) Screening Note   Patient Details  Name: [name] [name] Date of Birth: 1961-10-10   Transition of Care [facility]) CM/SW Contact:    Benard Halsted, LCSW Phone Number: 08/19/2022, 8:55 AM    Transition of Care Department [facility]) has reviewed patient. We will continue to monitor patient advancement through interdisciplinary progression rounds. If new patient transition needs arise, please place a TOC consult.

## 2022-08-19 NOTE — Plan of Care (Signed)

## 2022-08-20 LAB — GLUCOSE, CAPILLARY
Glucose-Capillary: 135 mg/dL — ABNORMAL HIGH (ref 70–99)
Glucose-Capillary: 125 mg/dL — ABNORMAL HIGH (ref 70–99)
Glucose-Capillary: 119 mg/dL — ABNORMAL HIGH (ref 70–99)
Glucose-Capillary: 119 mg/dL — ABNORMAL HIGH (ref 70–99)
Glucose-Capillary: 122 mg/dL — ABNORMAL HIGH (ref 70–99)
Glucose-Capillary: 131 mg/dL — ABNORMAL HIGH (ref 70–99)

## 2022-08-20 MED ORDER — DIPHENHYDRAMINE HCL 50 MG/ML IJ SOLN: 12.5000 mg | Freq: Four times a day (QID) | INTRAMUSCULAR | Status: DC | PRN

## 2022-08-20 MED ORDER — MORPHINE SULFATE 1 MG/ML IV SOLN PCA
INTRAVENOUS | Status: DC
  Administered 2022-08-20: 4 mg via INTRAVENOUS
  Administered 2022-08-20: 4.5 mg via INTRAVENOUS
  Administered 2022-08-21: 1 mg via INTRAVENOUS
  Administered 2022-08-21: 10.5 mg via INTRAVENOUS
  Administered 2022-08-21 – 2022-08-22 (×2): 7.5 mg via INTRAVENOUS
  Administered 2022-08-22: 4.5 mg via INTRAVENOUS
  Administered 2022-08-23: 3 mg via INTRAVENOUS
  Filled 2022-08-20 (×5): qty 30

## 2022-08-20 MED ORDER — NALOXONE HCL 0.4 MG/ML IJ SOLN: 0.4000 mg | INTRAMUSCULAR | Status: DC | PRN

## 2022-08-20 MED ORDER — DIPHENHYDRAMINE HCL 12.5 MG/5ML PO ELIX: 12.5000 mg | ORAL_SOLUTION | Freq: Four times a day (QID) | ORAL | Status: DC | PRN

## 2022-08-20 MED ORDER — SODIUM CHLORIDE 0.9% FLUSH
9.0000 mL | INTRAVENOUS | Status: DC | PRN
  Administered 2022-08-21: 9 mL via INTRAVENOUS

## 2022-08-20 MED ORDER — PHENOL 1.4 % MT LIQD
1.0000 | OROMUCOSAL | Status: DC | PRN
  Administered 2022-08-20: 1 via OROMUCOSAL
  Filled 2022-08-20: qty 177

## 2022-08-20 NOTE — Progress Notes (Signed)
Mobility Specialist Progress Note    08/20/22 1747  Mobility  Activity Ambulated with assistance in hallway  Level of Assistance Minimal assist, patient does 75% or more  Assistive Device Front wheel walker  Distance Ambulated (ft) 140 ft  Activity Response Tolerated fair  Mobility Referral Yes  $Mobility charge 1 Mobility   Pre-Mobility: 127 HR, 128/85 (95) BP, 90% SpO2 During Mobility: 164 HR Post-Mobility: 144 HR, 156/98 (113) BP, 91% SpO2  Pt received in bed requesting to mobilize. No complaints to start. HR sustained in 160s with activity. Upon return, c/o pain 10/10. Left in bed with call bell in reach. RN aware.  Hildred Alamin Mobility Specialist  Secure Chat Only

## 2022-08-20 NOTE — Progress Notes (Addendum)
      [address]       [place] [address]             [phone]      2 Days Post-Op Procedure(s) (LRB): XI ROBOTIC ASSISTED IVOR LEWIS ESOPHAGECTOMY (N/A) XI ROBOTIC ASSISTED JEJUNOSTOMY TUBE PLACEMENT (N/A) ESOPHAGOGASTRODUODENOSCOPY (EGD) (N/A) INTERCOSTAL NERVE BLOCK (Right) Subjective: Says he is very uncomfortable due to pain in his abdomen and back. This similar to the pain he has had since surgery. Gets relief with pain medications but Sx's return before he getting another dose.   Objective: Vital signs in last 24 hours: Temp:  [98.2 F (36.8 C)-98.4 F (36.9 C)] 98.4 F (36.9 C) (11/04 0802) Pulse Rate:  [81-105] 105 (11/04 0802) Cardiac Rhythm: Sinus tachycardia (11/04 0740) Resp:  [16-20] 19 (11/04 0802) BP: (101-122)/(66-90) 116/74 (11/04 0802) SpO2:  [93 %-98 %] 94 % (11/04 0802) Weight:  [89.1 kg] 89.1 kg (11/04 0453)    Intake/Output from previous day: 11/03 0701 - 11/04 0700 In: 311.2 [NG/GT:311.2] Out: 871 [Urine:550; Drains:290; Chest Tube:31] Intake/Output this shift: Total I/O In: -  Out: 300 [Urine:300]  General appearance: alert, cooperative, and very distressed due to pain. Has an NG in place. TF is infusing at 21m/hr via the J-Tube. Neurologic: intact Heart: sinus tach Lungs: breath sounds are clear, O2 sat 94% on RA. Abdomen: para-incisional tenderness, worse on the right side. Absent bowel sounds.  Extremities: no peripheral edema Wound: the port incisions have expected bruising, all dry and intact. The pleural tube and JP are secure and have thin serous drainage.    Lab Results: Recent Labs    08/18/22 1700 08/19/22 0014  WBC 10.6* 10.1  HGB 11.8* 10.9*  HCT 34.9* 32.6*  PLT 102* 106*   BMET:  Recent Labs    08/18/22 1700 08/19/22 0014  NA 138 140  K 4.5 5.2*  CL 106 108  CO2 20* 27  GLUCOSE 190* 164*  BUN 15 18  CREATININE 1.30* 1.45*  CALCIUM 9.3 9.4    PT/INR: No results for input(s): "LABPROT",  "INR" in the last 72 hours. ABG No results found for: "PHART", "HCO3", "TCO2", "ACIDBASEDEF", "O2SAT" CBG (last 3)  Recent Labs    08/19/22 2342 08/20/22 0454 08/20/22 0803  GLUCAP 129* 135* 125*    Assessment/Plan: S/P Procedure(s) (LRB): XI ROBOTIC ASSISTED IVOR LEWIS ESOPHAGECTOMY (N/A) XI ROBOTIC ASSISTED JEJUNOSTOMY TUBE PLACEMENT (N/A) ESOPHAGOGASTRODUODENOSCOPY (EGD) (N/A) INTERCOSTAL NERVE BLOCK (Right)  -POD2 robotic-assisted esophagectomy. Pain is the primary issue.  He appears very uncomfortable and is tachycardic. Unable to take Toradol due to renal insufficiency. Will add PCA.  Continue NPO and nutrition support. Mobilize as able. Repeat lab in AM and plan esophagram on Tuesday.    -Endo- no h/o DM. CBG's stable not requiring coverage.  -DVT PPX- mobilize, on daily enoxaparin.    LOS: 2 days    MMalon [phone]/01/2022 Patient seen and examined, agree with above Pain issues are significant, will add PCA  SRemo LippsC. HRoxan Hockey MD [facility] ([phone]

## 2022-08-20 NOTE — Progress Notes (Signed)
Mobility Specialist Progress Note    08/20/22 1626  Mobility  Activity Contraindicated/medical hold   RN advised d/t HR. Will f/u as appropriate.   Hildred Alamin Mobility Specialist  Secure Chat Only

## 2022-08-21 ENCOUNTER — Other Ambulatory Visit: Payer: Self-pay

## 2022-08-21 ENCOUNTER — Inpatient Hospital Stay (HOSPITAL_COMMUNITY): Payer: No Typology Code available for payment source

## 2022-08-21 LAB — BASIC METABOLIC PANEL
Anion gap: 8 (ref 5–15)
BUN: 11 mg/dL (ref 8–23)
CO2: 29 mmol/L (ref 22–32)
Calcium: 8.9 mg/dL (ref 8.9–10.3)
Chloride: 97 mmol/L — ABNORMAL LOW (ref 98–111)
Creatinine, Ser: 0.75 mg/dL (ref 0.61–1.24)
GFR, Estimated: >60 mL/min (ref >60)
Glucose, Bld: 131 mg/dL — ABNORMAL HIGH (ref 70–99)
Potassium: 4.2 mmol/L (ref 3.5–5.1)
Sodium: 134 mmol/L — ABNORMAL LOW (ref 135–145)

## 2022-08-21 LAB — CBC
HCT: 30.8 % — ABNORMAL LOW (ref 39.0–52.0)
Hemoglobin: 10.4 g/dL — ABNORMAL LOW (ref 13.0–17.0)
MCH: 34.2 pg — ABNORMAL HIGH (ref 26.0–34.0)
MCHC: 33.8 g/dL (ref 30.0–36.0)
MCV: 101.3 fL — ABNORMAL HIGH (ref 80.0–100.0)
Platelets: 97 10*3/uL — ABNORMAL LOW (ref 150–400)
RBC: 3.04 MIL/uL — ABNORMAL LOW (ref 4.22–5.81)
RDW: 12.3 % (ref 11.5–15.5)
WBC: 8.7 10*3/uL (ref 4.0–10.5)
nRBC: 0 % (ref 0.0–0.2)

## 2022-08-21 LAB — GLUCOSE, CAPILLARY
Glucose-Capillary: 104 mg/dL — ABNORMAL HIGH (ref 70–99)
Glucose-Capillary: 116 mg/dL — ABNORMAL HIGH (ref 70–99)
Glucose-Capillary: 123 mg/dL — ABNORMAL HIGH (ref 70–99)
Glucose-Capillary: 128 mg/dL — ABNORMAL HIGH (ref 70–99)
Glucose-Capillary: 133 mg/dL — ABNORMAL HIGH (ref 70–99)
Glucose-Capillary: 138 mg/dL — ABNORMAL HIGH (ref 70–99)

## 2022-08-21 MED ORDER — ACETAMINOPHEN 160 MG/5ML PO SOLN
650.0000 mg | Freq: Four times a day (QID) | ORAL | Status: DC | PRN
  Administered 2022-08-25 – 2022-09-03 (×9): 650 mg
  Filled 2022-08-21 (×9): qty 20.3

## 2022-08-21 NOTE — Progress Notes (Addendum)
[address]       [place] [address]             [phone]      3 Days Post-Op Procedure(s) (LRB): XI ROBOTIC ASSISTED IVOR LEWIS ESOPHAGECTOMY (N/A) XI ROBOTIC ASSISTED JEJUNOSTOMY TUBE PLACEMENT (N/A) ESOPHAGOGASTRODUODENOSCOPY (EGD) (N/A) INTERCOSTAL NERVE BLOCK (Right) Subjective: Reports a better day yesterday, pain control better. Throat is irritated by the NG but says he has had some relief with the Chloraseptic. No new concerns.   TF infusing at 20m/hr.  Bulb drains had 50109mpast 24 hours CT 3559mor 24 hours.    Objective: Vital signs in last 24 hours: Temp:  [98.4 F (36.9 C)-99.4 F (37.4 C)] 98.7 F (37.1 C) (11/05 0822) Pulse Rate:  [94-120] 103 (11/05 0822) Cardiac Rhythm: Normal sinus rhythm (11/05 0737) Resp:  [14-20] 15 (11/05 0940) BP: (101-128)/(65-76) 112/70 (11/05 0822) SpO2:  [90 %-96 %] 94 % (11/05 0940) FiO2 (%):  [37 %-40 %] 39 % (11/05 0940)    Intake/Output from previous day: 11/04 0701 - 11/05 0700 In: 1571.2 [NG/GT:1571.2] Out: 1485 [Urine:950; Drains:500; Chest Tube:35] Intake/Output this shift: No intake/output data recorded.  General appearance: alert, cooperative, and mild distress. Neurologic: intact Heart: sinus tach but HR improved Lungs: breath sounds are clear, O2 sat 94% on RA. Abdomen: mild para-incisional tenderness. Absent bowel sounds. Abdominal free air is resolving on CXR.  Extremities: no peripheral edema Wound: the port incisions have expected bruising, all dry and intact. The pleural tube and JP are secure and have thin serous drainage.    Lab Results: Recent Labs    08/19/22 0014 08/21/22 0013  WBC 10.1 8.7  HGB 10.9* 10.4*  HCT 32.6* 30.8*  PLT 106* 97*    BMET:  Recent Labs    08/19/22 0014 08/21/22 0013  NA 140 134*  K 5.2* 4.2  CL 108 97*  CO2 27 29  GLUCOSE 164* 131*  BUN 18 11  CREATININE 1.45* 0.75  CALCIUM 9.4 8.9     PT/INR: No results for input(s):  "LABPROT", "INR" in the last 72 hours. ABG No results found for: "PHART", "HCO3", "TCO2", "ACIDBASEDEF", "O2SAT" CBG (last 3)  Recent Labs    08/20/22 2325 08/21/22 0341 08/21/22 0819  GLUCAP 131* 116* 104*   CLINICAL DATA:  Status post esophagectomy   EXAM: PORTABLE CHEST 1 VIEW   COMPARISON:  08/19/2022   FINDINGS: Gastric catheter is again noted in satisfactory position. Right sided chest wall port is seen. Two chest tubes are noted on the right stable in appearance from the prior exam. Decreasing free air in the upper abdomen is noted. Basilar atelectasis is noted on the left. No pneumothorax is seen. Stable pneumopericardium is noted as well.   IMPRESSION: No evidence of pneumothorax.   Decreasing free air in the abdomen.   Mild left basilar atelectasis.     Electronically Signed   By: MarInez CatalinaD.   On: 08/21/2022 09:16  Assessment/Plan: S/P Procedure(s) (LRB): XI ROBOTIC ASSISTED IVOR LEWIS ESOPHAGECTOMY (N/A) XI ROBOTIC ASSISTED JEJUNOSTOMY TUBE PLACEMENT (N/A) ESOPHAGOGASTRODUODENOSCOPY (EGD) (N/A) INTERCOSTAL NERVE BLOCK (Right)  -POD3 robotic-assisted esophagectomy. Pain control is better.  Will contineu the PCA for another 24 hours.   Continue NPO and nutrition support. Mobilize as able. Plan esophagram on Tuesday.    -HEME- mild expected ABL anemia with stable Hct.   -Endo- no h/o DM. CBG's stable not requiring coverage.  -DVT PPX- mobilizing, on daily enoxaparin.  LOS: 3 days    Antony Odea, [place] [phone] 08/21/2022  Patient seen and examined, looks much better.  Agree with A/P outlined above  Remo Lipps C. Roxan Hockey, MD [facility] [phone]

## 2022-08-22 ENCOUNTER — Inpatient Hospital Stay (HOSPITAL_COMMUNITY): Payer: No Typology Code available for payment source

## 2022-08-22 LAB — GLUCOSE, CAPILLARY
Glucose-Capillary: 116 mg/dL — ABNORMAL HIGH (ref 70–99)
Glucose-Capillary: 115 mg/dL — ABNORMAL HIGH (ref 70–99)
Glucose-Capillary: 124 mg/dL — ABNORMAL HIGH (ref 70–99)
Glucose-Capillary: 130 mg/dL — ABNORMAL HIGH (ref 70–99)
Glucose-Capillary: 117 mg/dL — ABNORMAL HIGH (ref 70–99)
Glucose-Capillary: 136 mg/dL — ABNORMAL HIGH (ref 70–99)

## 2022-08-22 MED ORDER — IOHEXOL 300 MG/ML  SOLN
100.0000 mL | Freq: Once | INTRAMUSCULAR | Status: AC | PRN
  Administered 2022-08-22: 50 mL via ORAL

## 2022-08-22 NOTE — Progress Notes (Addendum)
      [address]       [place] [address]             [phone]       4 Days Post-Op Procedure(s) (LRB): XI ROBOTIC ASSISTED IVOR LEWIS ESOPHAGECTOMY (N/A) XI ROBOTIC ASSISTED JEJUNOSTOMY TUBE PLACEMENT (N/A) ESOPHAGOGASTRODUODENOSCOPY (EGD) (N/A) INTERCOSTAL NERVE BLOCK (Right)  Subjective:  He had a fair amount of incisional pain earlier but has no other complaint this am.  Objective: Vital signs in last 24 hours: Temp:  [98.4 F (36.9 C)-99.8 F (37.7 C)] 98.6 F (37 C) (11/06 0406) Pulse Rate:  [97-112] 97 (11/06 0406) Cardiac Rhythm: Normal sinus rhythm (11/06 0406) Resp:  [11-19] 11 (11/06 0406) BP: (102-120)/(69-84) 102/81 (11/06 0406) SpO2:  [92 %-95 %] 92 % (11/06 0406) FiO2 (%):  [10 %-39 %] 13 % (11/06 0406) Weight:  [91.7 kg] 91.7 kg (11/06 0406)     Intake/Output from previous day: 11/05 0701 - 11/06 0700 In: 917.7 [NG/GT:917.7] Out: 1557 [Urine:1250; Drains:257; Chest Tube:50]   Physical Exam:  Cardiovascular: RRR Pulmonary: Clear to auscultation bilaterally Abdomen: Soft, non tender, bowel sounds present. Extremities: No  lower extremity edema. Wounds: Clean and dry.  No erythema or signs of infection. Chest Tube:to water seal, sero sanguinous drainage JP drain: Sero sanguineous drainage  Lab Results: CBC: Recent Labs    08/21/22 0013  WBC 8.7  HGB 10.4*  HCT 30.8*  PLT 97*   BMET:  Recent Labs    08/21/22 0013  NA 134*  K 4.2  CL 97*  CO2 29  GLUCOSE 131*  BUN 11  CREATININE 0.75  CALCIUM 8.9    PT/INR: No results for input(s): "LABPROT", "INR" in the last 72 hours. ABG:  INR: Will add last result for INR, ABG once components are confirmed Will add last 4 CBG results once components are confirmed  Assessment/Plan:  1. CV - SR with HR in the 80-90's. 2.  Pulmonary - On 2 liters via [place]. Wean as able. Chest tube with 50 cc. JP drain with ser sanguinous drainage and 257 recorded for last 24 hours. CXR  Encourage incentive spirometer. Encourage incentive spirometer. 3. GI-NPO, NGT recorded as 917 cc last 24 hours (?).  CBGs 123/138/116. On TFs 60 ml/hr. Esophagram likely on Tuesday. 4. Anemia-Last H and H 10.4 and 30.8 5. Thrombocytopenia-Last platelets 97,000  Donielle M ZimmermanPA-C 08/22/2022,7:17 AM   Agree with above Swallow shows no leak Will start diet Will remove chest tube  Mccormick Macon O Nocholas Damaso

## 2022-08-22 NOTE — Progress Notes (Signed)
Patient able to eat today with dysphagia diet ordered. He ate about 5%. He does not prefer this diet but is calm and appropriate about.   In addition to the above lunch, he ate two 4 oz applesauce  cups and an [place] ice.

## 2022-08-22 NOTE — Plan of Care (Signed)
  Problem: Education: Goal: Knowledge of General Education information will improve Description: Including pain rating scale, medication(s)/side effects and non-pharmacologic comfort measures Outcome: Progressing   Problem: Health Behavior/Discharge Planning: Goal: Ability to manage health-related needs will improve Outcome: Progressing   Problem: Clinical Measurements: Goal: Respiratory complications will improve Outcome: Progressing   Problem: Nutrition: Goal: Adequate nutrition will be maintained Outcome: Progressing   Problem: Pain Managment: Goal: General experience of comfort will improve Outcome: Progressing

## 2022-08-22 NOTE — Progress Notes (Signed)
Mobility Specialist Progress Note:   08/22/22 1057  Mobility  Activity Ambulated with assistance in hallway  Level of Assistance Contact guard assist, steadying assist  Assistive Device Front wheel walker  Distance Ambulated (ft) 80 ft  Activity Response Tolerated well  $Mobility charge 1 Mobility   Pre- Mobility:  113 HR; 92%SpO2 During Mobility:138 HR; 94% SpO2 Post Mobility:   93 HR;  117/71 BP;  96% SpO2  Pt received in bed willing to participate in mobility. Complaints of pain at incision sites. No dizziness/lightheadedness. Left in bed with call bell in reach and all needs met.   [facility] Surveyor, mining Chat only

## 2022-08-22 NOTE — Discharge Summary (Signed)
[address]       [place] [address]             [phone]    Physician Discharge Summary  Patient ID: [name] [name] MRN: [id number] DOB/AGE: 1960/11/14 61 y.o.  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   Esophageal dysphagia 02/10/2022   History of colonic polyps 02/10/2022     Discharge Diagnoses:  Patient Active Problem List   Diagnosis Date Noted   Malnutrition of moderate degree 08/19/2022   Esophageal cancer ([place]) 08/18/2022   History of esophagectomy 08/18/2022   Chemotherapy induced neutropenia ([place]) 07/05/2022   Port-A-Cath in place 05/11/2022   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 male referred by Dr. 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  He recently presented with dysphagia and intentional weight loss in April 2023.  Subsequently this restriction.  He subsequently underwent upper endoscopy with biopsy along with endoscopic ultrasound which confirmed the diagnosis.  He is able to tolerate a regular diet with occasional dysphagia.  He has started an exercise program follow-up last year and has intentional been losing weight.   He completed his neoadjuvant chemotherapy and radiation a month ago.  He comes in today to discuss results from his PET/CT.  He is able to tolerate a diet and has  been able to put on some weight.  Hospital course:  The patient was admitted electively on 08/18/2022 at which time he underwent robotic assisted Ivor Lewis esophagectomy and placement of jejunostomy tube.  Patient tolerated procedure well and was taken to the postanesthesia care unit in stable condition.  He was kept n.p.o. and supported with tube feeding by way of the jejunostomy tube that was gradually advanced to the goal rate and well-tolerated.  CBGs were checked because of tube feedings. He had significant pain in the early days after surgery that improved significantly with the addition of a PCA pump.  DVT prophylaxis was managed with mobilization and daily subcutaneous enoxaparin.  The initial checks x-ray showed mild and expected free intra-abdominal air.  By the third postoperative day, this was improving radiographically. He tolerated tube feedings and these were up to 60 ml/hr as of 11/06. NG tube was removed on 11/06. Esophagram was done on 11/07 and showed no evidence or post op leak. He was put on a Dysphagia I diet, which he tolerated. Tube feedings were then changed to night time only so as to encourage him to take po during the day. He had thrombocytopenia post op. Platelets on 11/07 were up to 117,000. HHRN was arranged and per discharge planner, will be available on Monday. Wife taught how to flush J tube and administer nightly tube feedings.He is felt surgically stable for discharge.   Consults: None  Significant Diagnostic Studies:   Narrative & Impression  CLINICAL DATA:  Prior esophagectomy   EXAM: PORTABLE CHEST 1 VIEW  COMPARISON:  Chest x-ray dated August 15, 2022   FINDINGS: Right chest wall port unchanged in position. Enteric tube partially seen coursing below the diaphragm. Two right-sided chest tubes. Cardiac and mediastinal contours within normal limits for AP technique. Pneumomediastinum pneumoperitoneum. Left-greater-than-right basilar opacities, likely due  to atelectasis. No large pleural effusion evidence of pneumothorax.   IMPRESSION: 1. Two right-sided chest tubes in place. No evidence of pneumothorax. 2. Pneumomediastinum and pneumoperitoneum, likely postsurgical.     Electronically Signed   By: Yetta Glassman M.D.   On: 08/18/2022 16:00   Narrative & Impression  CLINICAL DATA:  Patient with history of distal esophageal cancer s/p Ivor Lewis esophagectomy 08/18/22, request for esophagram to evaluate for post operative leak prior to advancing oral intake.   EXAM: ESOPHAGUS/BARIUM SWALLOW/TABLET STUDY   TECHNIQUE: Single contrast examination was performed using Omnipaque 300. This exam was performed by Candiss Norse, PA-C, and was supervised and interpreted by Nelson Chimes, MD.   FLUOROSCOPY: Radiation Exposure Index (as provided by the fluoroscopic device): 1 minute 42 seconds 798.09 micro gray meter squared.   COMPARISON:  None Available.   FINDINGS: Swallowing: Appears normal. No vestibular penetration or aspiration seen.   Pharynx: Unremarkable.   Esophagus: Diffusely dilated esophagus. Post surgical changes of the distal esophagus and stomach. No leak noted in supine LPO, RPO and AP projections.   Esophageal motility: Within normal limits for post operative state.   Hiatal Hernia: None.   Gastroesophageal reflux: None visualized.   Ingested 13 mm barium tablet: Not given   Other: None.   IMPRESSION: Water soluble contrast esophagram without evidence of post operative leak.     Electronically Signed   By: Nelson Chimes M.D.   On: 08/22/2022 10:11   CLINICAL DATA:  Status post esophagectomy 08/18/2022 and chest tube removal   EXAM: PORTABLE CHEST 1 VIEW   COMPARISON:  Chest radiograph dated 08/21/2022   FINDINGS: Lines/tubes:   Interval removal of 1 right-sided chest tube. An additional right-sided chest tube remains.   Right chest wall port tip projects over the right atrium.    Chest: Low lung volumes with persistent bibasilar atelectasis.   Pleura: No definite pneumothorax. Trace right pleural effusion. Persistent pneumoperitoneum.   Heart/mediastinum: Enlarged cardiomediastinal silhouette is likely projectional. Decreased pneumopericardium.   Bones: No acute osseous abnormality.   IMPRESSION: 1. Interval removal of 1 right-sided chest tube. An additional right-sided chest tube remains. 2. No definite pneumothorax.  Trace right pleural effusion. 3. Persistent pneumoperitoneum. 4. Persistent bibasilar atelectasis.     Electronically Signed   By: Darrin Nipper M.D.   On: 08/23/2022 10:03  Treatments: Surgery: Esophagoscopy - Robotic assisted laparoscopy - Robotic assisted thoracoscopy - Ivor-Lewis esophagectomy - Pyloromyotomy - Laparoscopic jejunostomy tube placement 29F - Intercostal nerve block by Dr. Kipp Brood on 08/18/2022  Discharge Exam: Blood pressure 120/77, pulse (!) 102, temperature 98.4 F (36.9 C), resp. rate (!) 22, height '5\' 10"'$  (1.778 m), weight 87.6 kg, SpO2 95 %.  Cardiovascular: RRR Pulmonary: Clear to auscultation bilaterally Abdomen: Soft, J tube site mostly clean and dry, sporadic bowel sounds present. Extremities: No lower extremity edema. Wounds: Clean and dry.  No erythema or signs of infection. JP drain: Sero sanguineous drainage  Discharge Medications:  Allergies as of 08/25/2022   No Known Allergies      Medication List     TAKE these medications    acetaminophen 160 MG/5ML solution Commonly known as: TYLENOL Place 20.3 mLs (650 mg total) into feeding tube every  8 (eight) hours as needed for mild pain.   feeding supplement (OSMOLITE 1.5 CAL) Liqd PLACE 1,200 MLS INTO FEEDING TUBE DAILY.   feeding supplement (PROSource TF20) liquid Place 60 mLs into feeding tube daily.   HYDROcodone-acetaminophen 7.5-325 mg/15 ml solution Commonly known as: HYCET Place 10 mLs into feeding tube every 6 (six) hours as  needed for severe pain.   sodium chloride flush 0.9 % Soln Commonly known as: NS Place 30 mLs into feeding tube every 8 (eight) hours.        Follow-up Information     [facility] Follow up.   Why: tube feeding supplies Contact information: Home infusion [phone]        Lajuana Matte, MD. Go on 09/02/2022.   Specialty: Cardiothoracic Surgery Contact information: [address] [place] [address] [phone]                Signed:  Arnoldo Lenis 08/25/2022, 10:47 AM

## 2022-08-22 NOTE — TOC Progression Note (Signed)
Transition of Care [facility]) - Progression Note    Patient Details  Name: [name] [name] MRN: [id number] Date of Birth: 05-Apr-1961  [facility]) CM/SW Contact  Zenon Mayo, RN Phone Number: 08/22/2022, 4:19 PM  Clinical Narrative:    s/p esophgectomy, NG tube out, has peg tube, jp drains , for esphogram today, chest tube to water seal, pca pump.  Home with wife. TOC following.        Expected Discharge Plan and Services                                                 Social Determinants of Health (SDOH) Interventions Housing Interventions: Patient Refused  Readmission Risk Interventions     No data to display

## 2022-08-22 NOTE — Progress Notes (Signed)
CT removed without issues. No bleeding or oozing at the site. Patient tolerated well and is on bedrest until 1515.  Continue to monitor

## 2022-08-23 ENCOUNTER — Inpatient Hospital Stay (HOSPITAL_COMMUNITY): Payer: No Typology Code available for payment source

## 2022-08-23 LAB — CBC
HCT: 29.9 % — ABNORMAL LOW (ref 39.0–52.0)
Hemoglobin: 10.5 g/dL — ABNORMAL LOW (ref 13.0–17.0)
MCH: 34.4 pg — ABNORMAL HIGH (ref 26.0–34.0)
MCHC: 35.1 g/dL (ref 30.0–36.0)
MCV: 98 fL (ref 80.0–100.0)
Platelets: 117 10*3/uL — ABNORMAL LOW (ref 150–400)
RBC: 3.05 MIL/uL — ABNORMAL LOW (ref 4.22–5.81)
RDW: 12 % (ref 11.5–15.5)
WBC: 6.2 10*3/uL (ref 4.0–10.5)
nRBC: 0 % (ref 0.0–0.2)

## 2022-08-23 LAB — BASIC METABOLIC PANEL
Anion gap: 9 (ref 5–15)
BUN: 17 mg/dL (ref 8–23)
CO2: 28 mmol/L (ref 22–32)
Calcium: 8.9 mg/dL (ref 8.9–10.3)
Chloride: 96 mmol/L — ABNORMAL LOW (ref 98–111)
Creatinine, Ser: 0.61 mg/dL (ref 0.61–1.24)
GFR, Estimated: >60 mL/min (ref >60)
Glucose, Bld: 133 mg/dL — ABNORMAL HIGH (ref 70–99)
Potassium: 4.2 mmol/L (ref 3.5–5.1)
Sodium: 133 mmol/L — ABNORMAL LOW (ref 135–145)

## 2022-08-23 LAB — GLUCOSE, CAPILLARY
Glucose-Capillary: 104 mg/dL — ABNORMAL HIGH (ref 70–99)
Glucose-Capillary: 105 mg/dL — ABNORMAL HIGH (ref 70–99)
Glucose-Capillary: 138 mg/dL — ABNORMAL HIGH (ref 70–99)
Glucose-Capillary: 148 mg/dL — ABNORMAL HIGH (ref 70–99)
Glucose-Capillary: 95 mg/dL (ref 70–99)

## 2022-08-23 MED ORDER — OSMOLITE 1.5 CAL PO LIQD: 1200.0000 mL | ORAL | Status: DC

## 2022-08-23 MED ORDER — FREE WATER
200.0000 mL | Freq: Three times a day (TID) | Status: DC
  Administered 2022-08-23 – 2022-08-30 (×20): 200 mL

## 2022-08-23 MED ORDER — PROSOURCE TF20 ENFIT COMPATIBL EN LIQD
100.0000 mL | Freq: Every evening | ENTERAL | Status: DC
  Administered 2022-08-23: 100 mL
  Filled 2022-08-23: qty 120

## 2022-08-23 MED ORDER — SORBITOL 70 % SOLN
30.0000 mL | Freq: Once | Status: AC
  Administered 2022-08-23: 30 mL
  Filled 2022-08-23: qty 30

## 2022-08-23 MED ORDER — OSMOLITE 1.5 CAL PO LIQD
1200.0000 mL | ORAL | Status: DC
  Administered 2022-08-23 – 2022-08-25 (×3): 1200 mL
  Filled 2022-08-23 (×2): qty 1422

## 2022-08-23 MED ORDER — PROSOURCE TF20 ENFIT COMPATIBL EN LIQD
60.0000 mL | Freq: Every day | ENTERAL | Status: DC
  Administered 2022-08-25 – 2022-09-06 (×12): 60 mL
  Filled 2022-08-23 (×13): qty 60

## 2022-08-23 NOTE — Progress Notes (Signed)
Mobility Specialist Progress Note    08/23/22 1419  Mobility  Activity Ambulated with assistance in hallway  Level of Assistance Contact guard assist, steadying assist  Assistive Device Front wheel walker  Distance Ambulated (ft) 125 ft  Activity Response Tolerated well  Mobility Referral Yes  $Mobility charge 1 Mobility   Pre-Mobility: 110 HR, 108/72 (84) BP, 93% SpO2 During Mobility: 135 HR, 95% SpO2 Post-Mobility: 97 HR, 128/73 (89) BP, 97% SpO2  Pt received in bed and agreeable. C/o some pain. Returned to bed with call bell in reach.    Hildred Alamin Mobility Specialist  Secure Chat Only

## 2022-08-23 NOTE — Plan of Care (Signed)

## 2022-08-23 NOTE — Progress Notes (Signed)
Patient 5 days postoperative. He mentions today that he has not had a bowel movement since being here. Patient is on a PCA pump. No stool softeners or laxatives ordered. Will page for possible orders.  Continue to monitor.

## 2022-08-23 NOTE — Progress Notes (Signed)
Nutrition Follow-up  DOCUMENTATION CODES:   Non-severe (moderate) malnutrition in context of chronic illness  INTERVENTION:   Tube Feeding via J-tube: Change to Nocturnal TF per request by MD Osmolite 1.5 at 100 ml/hr x 12 hours (1800 to 0600 daily) This provides 1800 kcals, 75 g of protein and 912 mL of free water  Continue Pro-source TF20 60 mL daily, provides 20 g of protein and 80 kcals  Decrease free water flush now that pt able to take fluids by mouth. Change to 200 mL TID. If able to meet hydration   Recommend bowel regimen until pt has BM as pt without BM since admission, on pain regimen and receiving TF. RN discussed lack of BM with Attending today and pt to receive sorbitol  Pt declines any ONS here in the hospital. Pt has a specfici protein supplement that he prefers to use that is not on hospital formulary. Pt may have someone bring this in if desired.   NUTRITION DIAGNOSIS:   Moderate Malnutrition related to chronic illness as evidenced by percent weight loss, mild fat depletion, mild muscle depletion (12% over 5 months).  Being addressed via TF, diet advancement  GOAL:   Patient will meet greater than or equal to 90% of their needs  Progressing  MONITOR:   Labs, Diet advancement, TF tolerance  REASON FOR ASSESSMENT:   Consult, New TF Enteral/tube feeding initiation and management  ASSESSMENT:   61 y.o. 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  11/02 XI robotic assisted Ivor Lewis Esophagectomy, pyloromyotomy, with J-tube placement 11/06 Esophagram: no leak, Diet advanced to Dysphagia 1, Thins. Chest tube removed  Diet advanced to Dysphagia 1, Thins. Pt does not like the puree food from the hospital. Family brought in refried beans (puree consistency) which pt is eating and tolerating.   Pt does not like Ensure/Boost supplements; pt has a protein supplement at  home he prefers to use. Ok for pt to bring to hospital if he would like.   Tolerating Osmolite 1.5 at 70 ml/hr via J-tube  No documented BM. No Bowel regimen currently  Free water flush of 200 mL q 4 hours currently; pt now able to take free water by mouth. Plan to decrease free water per tube  Labs: sodium 133, Creatinine wdl, CBGs 105-138 Meds: reviewed  Diet Order:   Diet Order             DIET - DYS 1 Room service appropriate? Yes; Fluid consistency: Thin  Diet effective now                   EDUCATION NEEDS:   Not appropriate for education at this time  Skin:  Skin Assessment: Skin Integrity Issues: Skin Integrity Issues:: Incisions Incisions: Right chest  Last BM:  Unknown  Height:   Ht Readings from Last 1 Encounters:  08/18/22 '5\' 10"'$  (1.778 m)    Weight:   Wt Readings from Last 1 Encounters:  08/23/22 90.7 kg    Ideal Body Weight:  75.5 kg  BMI:  Body mass index is 28.69 kg/m.  Estimated Nutritional Needs:   Kcal:  4742-5956 kcals  Protein:  115-130 g  Fluid:  >/= 2L   Kerman Passey MS, RDN, LDN, CNSC Registered Dietitian 3 Clinical Nutrition RD Pager and On-Call Pager Number Located in [place]

## 2022-08-23 NOTE — Progress Notes (Addendum)
      [address]       [place] [address]             [phone]       5 Days Post-Op Procedure(s) (LRB): XI ROBOTIC ASSISTED IVOR LEWIS ESOPHAGECTOMY (N/A) XI ROBOTIC ASSISTED JEJUNOSTOMY TUBE PLACEMENT (N/A) ESOPHAGOGASTRODUODENOSCOPY (EGD) (N/A) INTERCOSTAL NERVE BLOCK (Right)  Subjective:  He states food did not taste good yesterday. He denies nausea or vomiting.  Objective: Vital signs in last 24 hours: Temp:  [98 F (36.7 C)-98.7 F (37.1 C)] 98.1 F (36.7 C) (11/07 0333) Pulse Rate:  [91-97] 91 (11/07 0333) Cardiac Rhythm: Normal sinus rhythm (11/06 2040) Resp:  [13-19] 13 (11/07 0501) BP: (100-117)/(63-71) 103/63 (11/07 0333) SpO2:  [92 %-100 %] 100 % (11/07 0501) FiO2 (%):  [14 %-17 %] 17 % (11/06 1614) Weight:  [90.7 kg] 90.7 kg (11/07 0333)     Intake/Output from previous day: 11/06 0701 - 11/07 0700 In: 36 [P.[name].:40] Out: 1708 [Urine:1600; Drains:83; Chest Tube:25]   Physical Exam:  Cardiovascular: RRR Pulmonary: Clear to auscultation bilaterally Abdomen: Soft, J tube site mostly clean and dry, bowel sounds present. Extremities: No lower extremity edema. Wounds: Clean and dry.  No erythema or signs of infection. JP drain: Sero sanguineous drainage  Lab Results: CBC: Recent Labs    08/21/22 0013 08/23/22 0018  WBC 8.7 6.2  HGB 10.4* 10.5*  HCT 30.8* 29.9*  PLT 97* 117*    BMET:  Recent Labs    08/21/22 0013 08/23/22 0018  NA 134* 133*  K 4.2 4.2  CL 97* 96*  CO2 29 28  GLUCOSE 131* 133*  BUN 11 17  CREATININE 0.75 0.61  CALCIUM 8.9 8.9     PT/INR: No results for input(s): "LABPROT", "INR" in the last 72 hours. ABG:  INR: Will add last result for INR, ABG once components are confirmed Will add last 4 CBG results once components are confirmed  Assessment/Plan:  1. CV - SR with HR in the 80-90's. 2.  Pulmonary - On 2-3 liters via [place]. Wean as able. JP drain with ser sanguinous drainage and 83 recorded for  last 24 hours. Chest tube removed yesterday. CXR appears stable. Encourage incentive spirometer. Encourage incentive spirometer. 3. GI-Esophagram done yesterday showed no leak.  On Dysphagia I diet. CBGs 117/136/138. On TFs 60 ml/hr. As discussed with Dr. Kipp Brood, will cycle TFs (TFs at night) 4. Anemia-H and H this am stable at 10.5 and 29.9 5. Thrombocytopenia-Platelets this am up to 117,000  [name] M ZimmermanPA-C 08/23/2022,7:32 AM  Agree with above Cycling tubefeeds Will d/c pca tomorrow IS ambulation  [name] [name] [name] [name]

## 2022-08-24 LAB — GLUCOSE, CAPILLARY
Glucose-Capillary: 146 mg/dL — ABNORMAL HIGH (ref 70–99)
Glucose-Capillary: 109 mg/dL — ABNORMAL HIGH (ref 70–99)
Glucose-Capillary: 115 mg/dL — ABNORMAL HIGH (ref 70–99)
Glucose-Capillary: 136 mg/dL — ABNORMAL HIGH (ref 70–99)
Glucose-Capillary: 171 mg/dL — ABNORMAL HIGH (ref 70–99)
Glucose-Capillary: 131 mg/dL — ABNORMAL HIGH (ref 70–99)

## 2022-08-24 LAB — SURGICAL PATHOLOGY

## 2022-08-24 MED ORDER — METOPROLOL TARTRATE 5 MG/5ML IV SOLN
2.5000 mg | Freq: Four times a day (QID) | INTRAVENOUS | Status: DC | PRN
  Administered 2022-08-25: 2.5 mg via INTRAVENOUS
  Filled 2022-08-24: qty 5

## 2022-08-24 MED ORDER — ORAL CARE MOUTH RINSE: 15.0000 mL | OROMUCOSAL | Status: DC | PRN

## 2022-08-24 NOTE — Progress Notes (Addendum)
      [address]       [place] [address]             [phone]       6 Days Post-Op Procedure(s) (LRB): XI ROBOTIC ASSISTED IVOR LEWIS ESOPHAGECTOMY (N/A) XI ROBOTIC ASSISTED JEJUNOSTOMY TUBE PLACEMENT (N/A) ESOPHAGOGASTRODUODENOSCOPY (EGD) (N/A) INTERCOSTAL NERVE BLOCK (Right)  Subjective:  He has been tolerating a diet. No bowel movement yet, despite laxative  Objective: Vital signs in last 24 hours: Temp:  [98.1 F (36.7 C)-99.6 F (37.6 C)] 98.1 F (36.7 C) (11/08 0330) Pulse Rate:  [88-110] 110 (11/08 0330) Cardiac Rhythm: Sinus tachycardia (11/07 2004) Resp:  [17-20] 20 (11/08 0444) BP: (103-122)/(70-81) 122/81 (11/08 0330) SpO2:  [93 %-95 %] 95 % (11/08 0444) FiO2 (%):  [15 %-22 %] 19 % (11/08 0444) Weight:  [89.2 kg] 89.2 kg (11/08 0330)     Intake/Output from previous day: 11/07 0701 - 11/08 0700 In: 2428.7 [NG/GT:2428.7] Out: 1180 [Urine:1000; Drains:180]   Physical Exam:  Cardiovascular: ST Pulmonary: Clear to auscultation bilaterally Abdomen: Soft, J tube site mostly clean and dry, bowel sounds present. Extremities: No lower extremity edema. Wounds: Clean and dry.  No erythema or signs of infection. JP drain: Sero sanguineous drainage  Lab Results: CBC: Recent Labs    08/23/22 0018  WBC 6.2  HGB 10.5*  HCT 29.9*  PLT 117*    BMET:  Recent Labs    08/23/22 0018  NA 133*  K 4.2  CL 96*  CO2 28  GLUCOSE 133*  BUN 17  CREATININE 0.61  CALCIUM 8.9     PT/INR: No results for input(s): "LABPROT", "INR" in the last 72 hours. ABG:  INR: Will add last result for INR, ABG once components are confirmed Will add last 4 CBG results once components are confirmed  Assessment/Plan:  1. CV - ST this am. Will discuss with Dr. Kipp Brood if should start BB 2.  Pulmonary - On 2 via [place]. Per documentation, does not appear to desat with ambulation. JP drain with ser sanguinous drainage and 180 recorded for last 24 hours.   Encourage incentive spirometer.  3. GI-Esophagram showed no leak.  On Dysphagia I diet. CBGs 104/148/146. On TFs 100 ml/hr and from 7p-7a. 4. Anemia-Last H and H  stable at 10.5 and 29.9 5. Thrombocytopenia-Last platelets up to 117,000 6. Stop PCA  Donielle M ZimmermanPA-C 08/24/2022,7:26 AM  Agree with above ST, will continue to watch.  JP output serosanguinous  Will d/c PCA Ambulation Dispo planning  Aubery Douthat O Param Capri

## 2022-08-24 NOTE — Progress Notes (Signed)
25 ml of Morphine wasted with Meliton Rattan RN from PCA pump on 11/8 at 1000.

## 2022-08-24 NOTE — Plan of Care (Signed)
  Problem: Clinical Measurements: Goal: Respiratory complications will improve Outcome: Progressing Goal: Cardiovascular complication will be avoided Outcome: Progressing   Problem: Nutrition: Goal: Adequate nutrition will be maintained Outcome: Progressing   Problem: Coping: Goal: Level of anxiety will decrease Outcome: Progressing   Problem: Elimination: Goal: Will not experience complications related to urinary retention Outcome: Progressing   Problem: Activity: Goal: Risk for activity intolerance will decrease Outcome: Not Progressing   Problem: Elimination: Goal: Will not experience complications related to bowel motility Outcome: Not Progressing   Problem: Pain Managment: Goal: General experience of comfort will improve Outcome: Not Progressing

## 2022-08-24 NOTE — Progress Notes (Signed)
Mobility Specialist Progress Note    08/24/22 1605  Mobility  Activity Ambulated with assistance in hallway  Level of Assistance Contact guard assist, steadying assist  Assistive Device Front wheel walker  Distance Ambulated (ft) 340 ft  Activity Response Tolerated well  Mobility Referral Yes  $Mobility charge 1 Mobility   Pre-Mobility: 107 HR During Mobility: 150 HR Post-Mobility: 147 HR  Pt received in bed and agreeable. No complaints on walk. Had BM in hall. Returned to [facility] with RN present.   Hildred Alamin Mobility Specialist  Please Psychologist, sport and exercise or Rehab Office at [phone]

## 2022-08-24 NOTE — TOC Progression Note (Addendum)
Transition of Care [facility]) - Progression Note    Patient Details  Name: LAYDEN CATERINO MRN: [id number] Date of Birth: 1961-07-01  Transition of Care [facility]) CM/SW Contact  Zenon Mayo, RN Phone Number: 08/24/2022, 11:05 AM  Clinical Narrative:    Per progression, MD states patient may go home with HS tube feeds. NCM offered choice for [facility] services.   Pam Chadler with [facility] will be following to see if they can provide the tube feeding , they can provide the tube feeding and patient is ok with [facility] .  He states his wife is at the hair salon and will be back soon. NCM will check back .  Pam Chadler will speak with patient and wife regarding education of the HS tube feeding.  Wife is back and requesting to speak with NCM,  she states she would like for NCM to check with [facility]- they declined, [facility]-  checking, [facility]- awaiting to hear back., [facility] - not in network. Wife states she has spoken with [facility] and she will be there today to go over with them about the education of the tube feeding. Patient has PCP.  Wife , Dezman Granda phone is [phone], agency will need to call her to set up apt times.  [facility] declined,  awaiting to hear from [place].         Expected Discharge Plan and Services                                                 Social Determinants of Health (SDOH) Interventions Housing Interventions: Patient Refused  Readmission Risk Interventions     No data to display

## 2022-08-24 NOTE — Plan of Care (Signed)
  Problem: Clinical Measurements: Goal: Respiratory complications will improve Outcome: Progressing   Problem: Activity: Goal: Risk for activity intolerance will decrease Outcome: Progressing   Problem: Nutrition: Goal: Adequate nutrition will be maintained Outcome: Progressing   Problem: Coping: Goal: Level of anxiety will decrease Outcome: Progressing   Problem: Elimination: Goal: Will not experience complications related to bowel motility Outcome: Progressing Goal: Will not experience complications related to urinary retention Outcome: Progressing   Problem: Pain Managment: Goal: General experience of comfort will improve Outcome: Progressing

## 2022-08-25 ENCOUNTER — Other Ambulatory Visit: Payer: Self-pay | Admitting: Physician Assistant

## 2022-08-25 LAB — GLUCOSE, CAPILLARY
Glucose-Capillary: 139 mg/dL — ABNORMAL HIGH (ref 70–99)
Glucose-Capillary: 111 mg/dL — ABNORMAL HIGH (ref 70–99)
Glucose-Capillary: 134 mg/dL — ABNORMAL HIGH (ref 70–99)
Glucose-Capillary: 169 mg/dL — ABNORMAL HIGH (ref 70–99)
Glucose-Capillary: 138 mg/dL — ABNORMAL HIGH (ref 70–99)

## 2022-08-25 MED ORDER — PROSOURCE TF20 ENFIT COMPATIBL EN LIQD: 60.0000 mL | Freq: Every day | ENTERAL | 30 refills | Status: DC

## 2022-08-25 MED ORDER — ACETAMINOPHEN 160 MG/5ML PO SOLN: 650.0000 mg | Freq: Three times a day (TID) | ORAL | 0 refills | Status: AC | PRN

## 2022-08-25 MED ORDER — HYDROCODONE-ACETAMINOPHEN 7.5-325 MG/15ML PO SOLN: 10.0000 mL | Freq: Four times a day (QID) | ORAL | 0 refills | Status: DC | PRN

## 2022-08-25 MED ORDER — FREE WATER: 200.0000 mL | Freq: Three times a day (TID) | 30 refills | Status: DC

## 2022-08-25 MED ORDER — SODIUM CHLORIDE 0.9% FLUSH: 30.0000 mL | Freq: Three times a day (TID) | INTRAVENOUS | 3 refills | Status: DC

## 2022-08-25 MED ORDER — OSMOLITE 1.5 CAL PO LIQD: 1200.0000 mL | ORAL | 30 refills | Status: DC

## 2022-08-25 NOTE — Progress Notes (Signed)
Patient has been in more pain than is typical today. Pain medications given; however, patient still in distress with some moaning off and on. He feels warm to the touch. Temp 100.0 and HR 140s (new). Patient's face is flushed, new to him.  Tylenol given. Will page MD. Continue to monitor.

## 2022-08-25 NOTE — Plan of Care (Signed)

## 2022-08-25 NOTE — TOC Transition Note (Signed)
Transition of Care [facility]) - CM/SW Discharge Note   Patient Details  Name: [name] [name] MRN: [id number] Date of Birth: Dec 13, 1960  Transition of Care [facility]) CM/SW Contact:  Zenon Mayo, RN Phone Number: 08/25/2022, 11:17 AM   Clinical Narrative:    NCM received call from Amy with [facility], they are able to take referral with [facility] on Monday 11/13.  Carolynn Sayers with [facility] did the education with patient for the tube feeding.  NCM informed wife that the [facility] will start on Monday and she states she feels comfortable with that.  NCM asked Amy with [facility] for an emergency number if patient wife needs it , she states they have an on call Nurse at  [phone].  Wife will transport patient home at dc.    Final next level of care: [place] Barriers to Discharge: No Barriers Identified   Patient Goals and CMS Choice Patient states their goals for this hospitalization and ongoing recovery are:: return home CMS Medicare.gov Compare Post Acute Care list provided to:: Patient Represenative (must comment) Choice offered to / list presented to : Spouse  Discharge Placement                       Discharge Plan and Services                DME Arranged: Tube feeding, Tube feeding pump ([facility]/ [facility]ynn Sayers)   Date DME Agency Contacted: 08/24/22 Time DME Agency Contacted: 1000 Representative spoke with at DME Agency: Carolynn Sayers [facility] Arranged: RN [place] Agency: [place] Date [place]: 08/25/22 Time [place]: 37 Representative spoke with at [place]: Amy  Social Determinants of Health ([place]) Interventions Housing Interventions: Patient Refused   Readmission Risk Interventions     No data to display

## 2022-08-25 NOTE — TOC Progression Note (Signed)
Transition of Care [facility]) - Progression Note    Patient Details  Name: [name] [name] MRN: [id number] Date of Birth: July 13, 1961  Transition of Care [facility]) CM/SW Contact  Zenon Mayo, RN Phone Number: 08/25/2022, 3:25 PM  Clinical Narrative:     Per Staff RN Janett Billow, patient has a fever and HR elevated, MD canceled the dc.  Pam Chadler is aware.  He is set up with [facility] for [facility] with [place] on Monday 11/13.   When Patient is discharged , Carolynn Sayers will need to be notified and [phone] .  TOC following.     Barriers to Discharge: No Barriers Identified  Expected Discharge Plan and Services           Expected Discharge Date: 08/25/22               DME Arranged: Tube feeding, Tube feeding pump ([facility]/ Carolynn Sayers)   Date DME Agency Contacted: 08/24/22 Time DME Agency Contacted: 1000 Representative spoke with at DME Agency: Carolynn Sayers [facility] Arranged: RN Jan [place] Agency: [place] Date [place]: 08/25/22 Time [place]: 31 Representative spoke with at [place]: Amy   Social Determinants of Health ([place]) Interventions Housing Interventions: Patient Refused  Readmission Risk Interventions     No data to display

## 2022-08-25 NOTE — Progress Notes (Signed)
Teaching done with spouse. Directions were given for emptying and recharging JP drain. Spouse was able to then empty it and recharge, demonstrating good understanding. Directions were given on q8h flushes to J-tube. Spouse was able to then flush the J-tube, demonstrating good understanding.  All questions were answered.

## 2022-08-25 NOTE — Progress Notes (Signed)
   08/25/22 1332  Assess: MEWS Score  Temp 100 F (37.8 C)  BP (!) 161/86  MAP (mmHg) 106  Pulse Rate (!) 142  Level of Consciousness Alert  SpO2 95 %  O2 Device Room Air  Patient Activity (if Appropriate) In bed  Assess: MEWS Score  MEWS Temp 0  MEWS Systolic 0  MEWS Pulse 3  MEWS RR 2  MEWS LOC 0  MEWS Score 5  MEWS Score Color Red  Assess: if the MEWS score is Yellow or Red  Were vital signs taken at a resting state? Yes  Focused Assessment No change from prior assessment  Does the patient meet 2 or more of the SIRS criteria? Yes  Does the patient have a confirmed or suspected source of infection? Yes  Provider and Rapid Response Notified? Yes  MEWS guidelines implemented *See Row Information* Yes  Treat  Pain Scale 0-10  Pain Score 7  Pain Type Acute pain  Pain Location Chest  Pain Orientation Right  Pain Descriptors / Indicators Discomfort  Pain Frequency Intermittent  Pain Onset With Activity  Notify: Charge Nurse/RN  Name of Charge Nurse/RN Notified Jacelyn Pi, RN  Date Charge Nurse/RN Notified 08/25/22  Time Charge Nurse/RN Notified 12  Notify: Provider  Provider Name/Title Myron, PA  Date Provider Notified 08/25/22  Time Provider Notified 1355  Method of Notification Page  Notification Reason Change in status  Assess: SIRS CRITERIA  SIRS Temperature  0  SIRS Pulse 1  SIRS Respirations  1  SIRS WBC 1  SIRS Score Sum  3   See earlier note. Myron, [place] and Dr. Kipp Brood to bedside and checked on patient. No new orders. Patient has orders in for discharge today. Discharge is on hold for today.

## 2022-08-25 NOTE — Progress Notes (Addendum)
      [address]       [place] [address]             [phone]       7 Days Post-Op Procedure(s) (LRB): XI ROBOTIC ASSISTED IVOR LEWIS ESOPHAGECTOMY (N/A) XI ROBOTIC ASSISTED JEJUNOSTOMY TUBE PLACEMENT (N/A) ESOPHAGOGASTRODUODENOSCOPY (EGD) (N/A) INTERCOSTAL NERVE BLOCK (Right)  Subjective: He had loose stools yesterday (TFs). He wants to go home.  Objective: Vital signs in last 24 hours: Temp:  [98.4 F (36.9 C)-99.5 F (37.5 C)] 98.4 F (36.9 C) (11/09 0742) Pulse Rate:  [102-112] 102 (11/09 0334) Cardiac Rhythm: Sinus tachycardia (11/08 2015) Resp:  [18-22] 22 (11/09 0742) BP: (117-135)/(76-109) 120/77 (11/09 0742) SpO2:  [92 %-95 %] 95 % (11/08 2331) Weight:  [87.6 kg] 87.6 kg (11/09 0500)     Intake/Output from previous day: 11/08 0701 - 11/09 0700 In: 1827 [P.O.:240; NG/GT:1587] Out: 960 [Urine:850; Drains:110]   Physical Exam:  Cardiovascular: RRR Pulmonary: Clear to auscultation bilaterally Abdomen: Soft, J tube site mostly clean and dry, sporadic bowel sounds present. Extremities: No lower extremity edema. Wounds: Clean and dry.  No erythema or signs of infection. JP drain: Sero sanguineous drainage  Lab Results: CBC: Recent Labs    08/23/22 0018  WBC 6.2  HGB 10.5*  HCT 29.9*  PLT 117*    BMET:  Recent Labs    08/23/22 0018  NA 133*  K 4.2  CL 96*  CO2 28  GLUCOSE 133*  BUN 17  CREATININE 0.61  CALCIUM 8.9     PT/INR: No results for input(s): "LABPROT", "INR" in the last 72 hours. ABG:  INR: Will add last result for INR, ABG once components are confirmed Will add last 4 CBG results once components are confirmed  Assessment/Plan:  1. CV - ST at times. HR up to 150 with ambulation yesterday. He is less tachycardic this am. No BB per Dr. Kipp Brood.  2.  Pulmonary - On room air. JP drain with ser sanguinous drainage and 110 recorded for last 24 hours.  Encourage incentive spirometer.  3. GI-  On Dysphagia I  diet. CBGs 131/139/111. On TFs 100 ml/hr and from 7p-7a. 4. Anemia-Last H and H  stable at 10.5 and 29.9 5. Thrombocytopenia-Last platelets up to 117,000 6. As discussed with Dr. Kipp Brood, discharge  Leahmarie Gasiorowski M ZimmermanPA-C 08/25/2022,7:46 AM

## 2022-08-25 NOTE — Progress Notes (Signed)
Mobility Specialist Progress Note    08/25/22 0942  Mobility  Activity Ambulated with assistance in hallway  Level of Assistance Standby assist, set-up cues, supervision of patient - no hands on  Assistive Device Front wheel walker  Distance Ambulated (ft) 150 ft  Activity Response Tolerated well  $Mobility charge 1 Mobility   Pre-Mobility: 102 HR During Mobility: 140 HR Post-Mobility: 124 HR  Pt received in bed and agreeable. No complaints on walk. Returned to sitting EOB with call bell in reach.    Hildred Alamin Mobility Specialist  Please Psychologist, sport and exercise or Rehab Office at [phone]

## 2022-08-26 ENCOUNTER — Inpatient Hospital Stay (HOSPITAL_COMMUNITY): Payer: No Typology Code available for payment source

## 2022-08-26 LAB — CBC WITH DIFFERENTIAL/PLATELET
Abs Immature Granulocytes: 0.08 10*3/uL — ABNORMAL HIGH (ref 0.00–0.07)
Basophils Absolute: 0 10*3/uL (ref 0.0–0.1)
Basophils Relative: 0 %
Eosinophils Absolute: 0 10*3/uL (ref 0.0–0.5)
Eosinophils Relative: 0 %
HCT: 31.9 % — ABNORMAL LOW (ref 39.0–52.0)
Hemoglobin: 11.4 g/dL — ABNORMAL LOW (ref 13.0–17.0)
Immature Granulocytes: 1 %
Lymphocytes Relative: 1 %
Lymphs Abs: 0.2 10*3/uL — ABNORMAL LOW (ref 0.7–4.0)
MCH: 34.3 pg — ABNORMAL HIGH (ref 26.0–34.0)
MCHC: 35.7 g/dL (ref 30.0–36.0)
MCV: 96.1 fL (ref 80.0–100.0)
Monocytes Absolute: 1.1 10*3/uL — ABNORMAL HIGH (ref 0.1–1.0)
Monocytes Relative: 7 %
Neutro Abs: 15.2 10*3/uL — ABNORMAL HIGH (ref 1.7–7.7)
Neutrophils Relative %: 91 %
Platelets: 164 10*3/uL (ref 150–400)
RBC: 3.32 MIL/uL — ABNORMAL LOW (ref 4.22–5.81)
RDW: 12.1 % (ref 11.5–15.5)
WBC: 16.6 10*3/uL — ABNORMAL HIGH (ref 4.0–10.5)
nRBC: 0 % (ref 0.0–0.2)

## 2022-08-26 LAB — GLUCOSE, CAPILLARY
Glucose-Capillary: 108 mg/dL — ABNORMAL HIGH (ref 70–99)
Glucose-Capillary: 142 mg/dL — ABNORMAL HIGH (ref 70–99)
Glucose-Capillary: 165 mg/dL — ABNORMAL HIGH (ref 70–99)
Glucose-Capillary: 165 mg/dL — ABNORMAL HIGH (ref 70–99)
Glucose-Capillary: 189 mg/dL — ABNORMAL HIGH (ref 70–99)
Glucose-Capillary: 235 mg/dL — ABNORMAL HIGH (ref 70–99)

## 2022-08-26 MED ORDER — PIPERACILLIN-TAZOBACTAM 3.375 G IVPB
3.3750 g | Freq: Three times a day (TID) | INTRAVENOUS | Status: DC
  Administered 2022-08-26 – 2022-09-05 (×31): 3.375 g via INTRAVENOUS
  Filled 2022-08-26 (×34): qty 50

## 2022-08-26 MED ORDER — PIPERACILLIN-TAZOBACTAM 3.375 G IVPB 30 MIN
3.3750 g | Freq: Once | INTRAVENOUS | Status: AC
  Administered 2022-08-26: 3.375 g via INTRAVENOUS
  Filled 2022-08-26: qty 50

## 2022-08-26 MED ORDER — OSMOLITE 1.5 CAL PO LIQD
1000.0000 mL | ORAL | Status: DC
  Administered 2022-08-26: 1000 mL

## 2022-08-26 MED ORDER — KETOROLAC TROMETHAMINE 30 MG/ML IJ SOLN
30.0000 mg | Freq: Four times a day (QID) | INTRAMUSCULAR | Status: AC
  Administered 2022-08-26 – 2022-08-31 (×19): 30 mg via INTRAVENOUS
  Filled 2022-08-26 (×19): qty 1

## 2022-08-26 MED ORDER — OSMOLITE 1.5 CAL PO LIQD: 1680.0000 mL | ORAL | Status: DC

## 2022-08-26 NOTE — Progress Notes (Addendum)
      [address]       [place] [address]             [phone]       8 Days Post-Op Procedure(s) (LRB): XI ROBOTIC ASSISTED IVOR LEWIS ESOPHAGECTOMY (N/A) XI ROBOTIC ASSISTED JEJUNOSTOMY TUBE PLACEMENT (N/A) ESOPHAGOGASTRODUODENOSCOPY (EGD) (N/A) INTERCOSTAL NERVE BLOCK (Right)  Subjective: Discharge held yesterday due to increased pain, fever, and more tachycardia. He was profusely sweating yesterday.  Objective: Vital signs in last 24 hours: Temp:  [98 F (36.7 C)-100.9 F (38.3 C)] 98.8 F (37.1 C) (11/10 0400) Pulse Rate:  [110-145] 128 (11/10 0400) Cardiac Rhythm: Sinus tachycardia (11/09 2030) Resp:  [22-37] 27 (11/10 0400) BP: (109-161)/(74-88) 128/74 (11/10 0400) SpO2:  [93 %-95 %] 93 % (11/10 0400) Weight:  [86 kg] 86 kg (11/10 0500)     Intake/Output from previous day: 11/09 0701 - 11/10 0700 In: 1130 [NG/GT:1130] Out: 1035 [Urine:950; Drains:85]   Physical Exam:  Cardiovascular: RRR Pulmonary: Clear to auscultation bilaterally Abdomen: Soft, J tube site mostly clean and dry (has dried blood), sporadic bowel sounds present. Extremities: No lower extremity edema. Wounds: Clean and dry.  No erythema or signs of infection. JP drain: Sero sanguineous drainage  Lab Results: CBC: Recent Labs    08/26/22 0012  WBC 16.6*  HGB 11.4*  HCT 31.9*  PLT 164    BMET:  No results for input(s): "NA", "K", "CL", "CO2", "GLUCOSE", "BUN", "CREATININE", "CALCIUM" in the last 72 hours.   PT/INR: No results for input(s): "LABPROT", "INR" in the last 72 hours. ABG:  INR: Will add last result for INR, ABG once components are confirmed Will add last 4 CBG results once components are confirmed  Assessment/Plan:  1. CV - ST at times;likely related to sepsis as now with increased WBC.  2.  Pulmonary - On room air. JP drain with ser sanguinous drainage and 85 recorded for last 24 hours.  Encourage incentive spirometer.  3. GI-  Will make NPO  and ask nutrition to evaluate. CBGs 138/189/235. On TFs 100 ml/hr and from 7p-7a. 4. Anemia-H and H increased to 11.4 and 31.9 this am 5. Thrombocytopenia resolved-Platelets this am up to 164,000 6. Leukocytosis-WBC this am up to 16,600. Will start empiric Zosyn and discuss with Dr. Kipp Brood.  [name] M ZimmermanPA-C 08/26/2022,7:26 AM  Remains tachycardic. Low-grade fevers overnight.  Now has a leukocytosis. Pain remains a major issue. Zosyn started,.  Chest x-ray does not show any concerning pleural effusion. Abdominal exam is benign. Added Toradol for pain control. We will continue to watch.  [name] [name]

## 2022-08-26 NOTE — Progress Notes (Signed)
Pharmacy Antibiotic Note  [name] [name] is a 61 y.o. male admitted on 08/18/2022.  Pharmacy has been consulted for Zosyn dosing for concern of sepsis.   Plan: Zosyn 3.'375mg'$  (30 minute infusion) x1 followed by Zosyn 3.375g IV q8h (extended infusion)  Monitor renal function, cultures, and clinical progression  Height: '5\' 10"'$  (177.8 cm) Weight: 86 kg (189 lb 9.5 oz) IBW/kg (Calculated) : 73  Temp (24hrs), Avg:99.1 F (37.3 C), Min:98 F (36.7 C), Max:100.9 F (38.3 C)  Recent Labs  Lab 08/21/22 0013 08/23/22 0018 08/26/22 0012  WBC 8.7 6.2 16.6*  CREATININE 0.75 0.61  --     Estimated Creatinine Clearance: 97.6 mL/min (by C-G formula based on SCr of 0.61 mg/dL).    No Known Allergies  Antimicrobials this admission: Zosyn 11/10 >>   Dose adjustments this admission:   Microbiology results:   Thank you for allowing pharmacy to be a part of this patient's care.  Cristela Felt, PharmD, BCPS Clinical Pharmacist 08/26/2022 7:44 AM

## 2022-08-26 NOTE — Progress Notes (Signed)
Nutrition Follow-up  DOCUMENTATION CODES:   Non-severe (moderate) malnutrition in context of chronic illness  INTERVENTION:   Tube Feeding via J-tube: Osmolite 1.5 at 115 ml/hr x 14 hours Pro-source TF20 60 mL daily This provides 121 g of protein, 2495 kcals, 1224 mL of free water  Total free water with TF and current free water flush of 200 mL q 8 hours: 1825 mL If pt unable to tolerate much fluid by mouth, may need to increase free waer flush  Pt can stop the Pro-source TF20 at discharge. Pt does not need to continue at home.   PO intake as able. NPO this AM but then diet re-advanced to Dysphagia 1/Thins. Pt has been drinking water but not eating much at all.   `NUTRITION DIAGNOSIS:   Moderate Malnutrition related to chronic illness as evidenced by percent weight loss, mild fat depletion, mild muscle depletion (12% over 5 months).  Being addressed via   GOAL:   Patient will meet greater than or equal to 90% of their needs  Progressing  MONITOR:   Labs, Diet advancement, TF tolerance  REASON FOR ASSESSMENT:   Consult, New TF Enteral/tube feeding initiation and management  ASSESSMENT:   61 y.o. 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  Discharge held yesterday due to increased pain, fever and tachycardia. WBC increasing. Diaphoretic on visit today  Now NPO again this AM, noted MD resumed Dysphagia 1/Thins later. Pt has not been eating much however on the puree diet since diet advanced. Drinking water, had some peaches  Peptamen/Vital 1.5 at 100 ml/hr x 12 hours currently Free water flush of 200 mL q 8 hours  Plan to return to meet 100% of nutritional needs at this time given po intake minimal when allowed to eat. Pt and wife did not want 24 hour feedings or 18 hours at this time. Pt agreeable to 14 hours with slightly increased rate.   Current wt 86 kg  Labs: no BMP  today, WBC 16.6 Meds: ss novolog    Diet Order:   Diet Order             DIET - DYS 1 Room service appropriate? Yes; Fluid consistency: Thin  Diet effective now                   EDUCATION NEEDS:   Not appropriate for education at this time  Skin:  Skin Assessment: Skin Integrity Issues: Skin Integrity Issues:: Incisions Incisions: Right chest  Last BM:  11/8  Height:   Ht Readings from Last 1 Encounters:  08/18/22 '5\' 10"'$  (1.778 m)    Weight:   Wt Readings from Last 1 Encounters:  08/26/22 86 kg    Ideal Body Weight:  75.5 kg  BMI:  Body mass index is 27.2 kg/m.  Estimated Nutritional Needs:   Kcal:  6568-1275 kcals  Protein:  115-130 g  Fluid:  >/= 2L  Kerman Passey MS, RDN, LDN, CNSC Registered Dietitian 3 Clinical Nutrition RD Pager and On-Call Pager Number Located in [place]

## 2022-08-27 LAB — GLUCOSE, CAPILLARY
Glucose-Capillary: 101 mg/dL — ABNORMAL HIGH (ref 70–99)
Glucose-Capillary: 128 mg/dL — ABNORMAL HIGH (ref 70–99)
Glucose-Capillary: 153 mg/dL — ABNORMAL HIGH (ref 70–99)
Glucose-Capillary: 168 mg/dL — ABNORMAL HIGH (ref 70–99)
Glucose-Capillary: 175 mg/dL — ABNORMAL HIGH (ref 70–99)
Glucose-Capillary: 96 mg/dL (ref 70–99)

## 2022-08-27 LAB — CBC
HCT: 31.8 % — ABNORMAL LOW (ref 39.0–52.0)
Hemoglobin: 10.8 g/dL — ABNORMAL LOW (ref 13.0–17.0)
MCH: 33.8 pg (ref 26.0–34.0)
MCHC: 34 g/dL (ref 30.0–36.0)
MCV: 99.4 fL (ref 80.0–100.0)
Platelets: 167 10*3/uL (ref 150–400)
RBC: 3.2 MIL/uL — ABNORMAL LOW (ref 4.22–5.81)
RDW: 12.1 % (ref 11.5–15.5)
WBC: 16 10*3/uL — ABNORMAL HIGH (ref 4.0–10.5)
nRBC: 0 % (ref 0.0–0.2)

## 2022-08-27 MED ORDER — FLUCONAZOLE IN SODIUM CHLORIDE 400-0.9 MG/200ML-% IV SOLN
800.0000 mg | INTRAVENOUS | Status: DC
  Administered 2022-08-27 – 2022-09-04 (×8): 800 mg via INTRAVENOUS
  Filled 2022-08-27 (×10): qty 400

## 2022-08-27 MED ORDER — OSMOLITE 1.5 CAL PO LIQD
1000.0000 mL | ORAL | Status: DC
  Administered 2022-08-27: 1000 mL

## 2022-08-27 NOTE — Progress Notes (Addendum)
      [address]       [place] [address]             [phone]      9 Days Post-Op Procedure(s) (LRB): XI ROBOTIC ASSISTED IVOR LEWIS ESOPHAGECTOMY (N/A) XI ROBOTIC ASSISTED JEJUNOSTOMY TUBE PLACEMENT (N/A) ESOPHAGOGASTRODUODENOSCOPY (EGD) (N/A) INTERCOSTAL NERVE BLOCK (Right)  Subjective:  Patient with cough.  He hasn't been eating much as food doesn't taste great.  Objective: Vital signs in last 24 hours: Temp:  [98.3 F (36.8 C)-98.9 F (37.2 C)] 98.9 F (37.2 C) (11/11 0730) Pulse Rate:  [105-128] 112 (11/11 7322) Cardiac Rhythm: Normal sinus rhythm;Sinus tachycardia (11/10 2000) Resp:  [20-30] 25 (11/11 0833) BP: (90-130)/(60-80) 96/60 (11/11 0730) SpO2:  [92 %-95 %] 94 % (11/11 0833)  Intake/Output from previous day: 11/10 0701 - 11/11 0700 In: [place] [P.O.:480; NG/GT:1318] Out: 310 [Urine:300; Drains:10] Intake/Output this shift: Total I/O In: 480 [P.O.:480] Out: -   General appearance: alert, cooperative, and no distress Heart: regular rate and rhythm and tachy Lungs: clear to auscultation bilaterally Abdomen: soft, non-tender; bowel sounds normal; no masses,  no organomegaly Extremities: extremities normal, atraumatic, no cyanosis or edema  JP drain: brown, milky drainage  Lab Results: Recent Labs    08/26/22 0012 08/27/22 0020  WBC 16.6* 16.0*  HGB 11.4* 10.8*  HCT 31.9* 31.8*  PLT 164 167   BMET: No results for input(s): "NA", "K", "CL", "CO2", "GLUCOSE", "BUN", "CREATININE", "CALCIUM" in the last 72 hours.  PT/INR: No results for input(s): "LABPROT", "INR" in the last 72 hours. ABG No results found for: "PHART", "HCO3", "TCO2", "ACIDBASEDEF", "O2SAT" CBG (last 3)  Recent Labs    08/27/22 0446 08/27/22 0751 08/27/22 1141  GLUCAP 175* 153* 128*    Assessment/Plan: S/P Procedure(s) (LRB): XI ROBOTIC ASSISTED IVOR LEWIS ESOPHAGECTOMY (N/A) XI ROBOTIC ASSISTED JEJUNOSTOMY TUBE PLACEMENT  (N/A) ESOPHAGOGASTRODUODENOSCOPY (EGD) (N/A) INTERCOSTAL NERVE BLOCK (Right)  CV- Sinus Tachycardia- likely due to infectious process, elevate WBC Pulm- off oxygen, JP drain with milky, brown drainage.. this is no longer serous GI- patient on tube feeds as adjusted by Nutrition.. encouraged patient to hold off on oral intake for now until we can assess output ID-+ leukocytosis, remains febrile at times.. now JP drain is milky and brown.. I'm concerned there could be an anastomic leak that was not identified on initial swallow vs. Chest/abdominal process.. I think we need to obtain CT of chest and abdomen will discuss with Dr. Cyndia Bent. Continue Zosyn for now   LOS: 9 days    Ellwood Handler, PA-C  08/27/2022   Chart reviewed, patient examined, agree with above. He looks ok but has leukocytosis to 16K and mild sinus tachycardia. He says he feels ok. JP drainage is turbid brown color and reportedly had been clear. Have to be concerned about anastomotic leak. Will keep NPO except few ice chips and plan to do esophagram or CT depending on what HL feels would be best. Continue antibiotic.

## 2022-08-27 NOTE — Progress Notes (Signed)
Mobility Specialist Progress Note:   08/27/22 1309  Mobility  Activity Stood at bedside  Level of Assistance Independent  Assistive Device None  Activity Response Tolerated well  Mobility Referral Yes  $Mobility charge 1 Mobility   Pt received in bed and agreeable with encouragement. Pt stood EOB without physical assistance, declined further ambulation d/t fear of recent infection. Pt left in bed with all needs met, call bell in reach, and family in room.   Andrey Campanile Mobility Specialist Please contact via SecureChat or  Rehab office at [phone]

## 2022-08-27 NOTE — Progress Notes (Addendum)
Nutrition Brief Note  Received page: MD requesting to change patient to continuous feeds. Pt is now NPO.   INTERVENTION:   Change to Osmolite 1.5'@70ml'$ /hr continuous + ProSource TF 20- Give 88m daily via tube  Free water flushes 2018mq8 hours   Regimen provides 2600kcal/day, 125g/day protein and 188053may of free water.   Estimated Nutritional Needs:   Kcal:  2308850-2774als/day   Protein:  115-130 g/day   Fluid:  >/= 2L  CasKoleen [name], RD, LDN Please refer to [facility] and/or RD on-call/weekend/after hours pager

## 2022-08-27 NOTE — Progress Notes (Signed)
     [address]       [place] [address]             [phone]       Change in chest tube consistency Agree with NPO Added diflucan CT chest abd ordered Will plan for EGD and possible stent placement on Monday  [place]

## 2022-08-28 ENCOUNTER — Inpatient Hospital Stay (HOSPITAL_COMMUNITY): Payer: No Typology Code available for payment source

## 2022-08-28 LAB — GLUCOSE, CAPILLARY
Glucose-Capillary: 102 mg/dL — ABNORMAL HIGH (ref 70–99)
Glucose-Capillary: 103 mg/dL — ABNORMAL HIGH (ref 70–99)
Glucose-Capillary: 105 mg/dL — ABNORMAL HIGH (ref 70–99)
Glucose-Capillary: 128 mg/dL — ABNORMAL HIGH (ref 70–99)
Glucose-Capillary: 130 mg/dL — ABNORMAL HIGH (ref 70–99)
Glucose-Capillary: 132 mg/dL — ABNORMAL HIGH (ref 70–99)
Glucose-Capillary: 134 mg/dL — ABNORMAL HIGH (ref 70–99)
Glucose-Capillary: 154 mg/dL — ABNORMAL HIGH (ref 70–99)

## 2022-08-28 LAB — CBC
HCT: 28.8 % — ABNORMAL LOW (ref 39.0–52.0)
Hemoglobin: 9.6 g/dL — ABNORMAL LOW (ref 13.0–17.0)
MCH: 33.1 pg (ref 26.0–34.0)
MCHC: 33.3 g/dL (ref 30.0–36.0)
MCV: 99.3 fL (ref 80.0–100.0)
Platelets: 160 10*3/uL (ref 150–400)
RBC: 2.9 MIL/uL — ABNORMAL LOW (ref 4.22–5.81)
RDW: 12.4 % (ref 11.5–15.5)
WBC: 12.7 10*3/uL — ABNORMAL HIGH (ref 4.0–10.5)
nRBC: 0 % (ref 0.0–0.2)

## 2022-08-28 LAB — BASIC METABOLIC PANEL
Anion gap: 9 (ref 5–15)
BUN: 37 mg/dL — ABNORMAL HIGH (ref 8–23)
CO2: 26 mmol/L (ref 22–32)
Calcium: 8.5 mg/dL — ABNORMAL LOW (ref 8.9–10.3)
Chloride: 99 mmol/L (ref 98–111)
Creatinine, Ser: 0.94 mg/dL (ref 0.61–1.24)
GFR, Estimated: >60 mL/min (ref >60)
Glucose, Bld: 148 mg/dL — ABNORMAL HIGH (ref 70–99)
Potassium: 3.1 mmol/L — ABNORMAL LOW (ref 3.5–5.1)
Sodium: 134 mmol/L — ABNORMAL LOW (ref 135–145)

## 2022-08-28 MED ORDER — OSMOLITE 1.5 CAL PO LIQD
1000.0000 mL | ORAL | Status: DC
  Administered 2022-08-28: 1000 mL

## 2022-08-28 MED ORDER — IOHEXOL 350 MG/ML SOLN
75.0000 mL | Freq: Once | INTRAVENOUS | Status: AC | PRN
  Administered 2022-08-28: 75 mL via INTRAVENOUS

## 2022-08-28 MED ORDER — POTASSIUM CHLORIDE 20 MEQ PO PACK
60.0000 meq | PACK | Freq: Every day | ORAL | Status: DC
  Administered 2022-08-28 – 2022-08-30 (×3): 60 meq
  Filled 2022-08-28 (×4): qty 3

## 2022-08-28 MED ORDER — ONDANSETRON HCL 4 MG/2ML IJ SOLN
4.0000 mg | INTRAMUSCULAR | Status: DC
  Administered 2022-08-28 – 2022-09-05 (×44): 4 mg via INTRAVENOUS
  Filled 2022-08-28 (×45): qty 2

## 2022-08-28 NOTE — Progress Notes (Addendum)
[address]       [place] [address]             [phone]      10 Days Post-Op Procedure(s) (LRB): XI ROBOTIC ASSISTED IVOR LEWIS ESOPHAGECTOMY (N/A) XI ROBOTIC ASSISTED JEJUNOSTOMY TUBE PLACEMENT (N/A) ESOPHAGOGASTRODUODENOSCOPY (EGD) (N/A) INTERCOSTAL NERVE BLOCK (Right)  Subjective:  Patient with complaints of fullness and nausea.  He is also experiencing a lot of coughing.  Finally he states that he woke up with soaked sheets from sweating.  Wife at bedside with multiple concerns.  I have addressed most of these will reach out to [facility] team for remaining concerns.  Objective: Vital signs in last 24 hours: Temp:  [97.8 F (36.6 C)-99 F (37.2 C)] 99 F (37.2 C) (11/12 1135) Pulse Rate:  [96-115] 115 (11/12 1135) Cardiac Rhythm: Sinus tachycardia (11/12 0723) Resp:  [18-27] 27 (11/12 1135) BP: (103-135)/(57-87) 113/70 (11/12 1135) SpO2:  [93 %-96 %] 96 % (11/12 1135) Weight:  [86.4 kg] 86.4 kg (11/12 0632)  Intake/Output from previous day: 11/11 0701 - 11/12 0700 In: 1128.3 [P.O.:480; I.V.:42.5; IV Piggyback:605.8] Out: 580 [Urine:500; Drains:80]  General appearance: alert and mild distress Heart: regular rate and rhythm and tachy Lungs: diminished breath sounds bibasilar Abdomen: soft, non-tender; bowel sounds normal; no masses,  no organomegaly Extremities: extremities normal, atraumatic, no cyanosis or edema Wound: clean and dry  Lab Results: Recent Labs    08/27/22 0020 08/28/22 0005  WBC 16.0* 12.7*  HGB 10.8* 9.6*  HCT 31.8* 28.8*  PLT 167 160   BMET:  Recent Labs    08/28/22 0005  NA 134*  K 3.1*  CL 99  CO2 26  GLUCOSE 148*  BUN 37*  CREATININE 0.94  CALCIUM 8.5*    PT/INR: No results for input(s): "LABPROT", "INR" in the last 72 hours. ABG No results found for: "PHART", "HCO3", "TCO2", "ACIDBASEDEF", "O2SAT" CBG (last 3)  Recent Labs    08/28/22 0040 08/28/22 0339 08/28/22 0732  GLUCAP 134* 132* 154*     Assessment/Plan: S/P Procedure(s) (LRB): XI ROBOTIC ASSISTED IVOR LEWIS ESOPHAGECTOMY (N/A) XI ROBOTIC ASSISTED JEJUNOSTOMY TUBE PLACEMENT (N/A) ESOPHAGOGASTRODUODENOSCOPY (EGD) (N/A) INTERCOSTAL NERVE BLOCK (Right)  CV- Sinus Tachycardia Pulm- off oxygen, JP drain now with yellow purulent material present, some serous... Highly suspicious for Anastomotic Leak... CT with oral contrast chest and abd ordered.. has not yet been completed GI- patient feels full and is nauseated.. I have stopped tube feeds for 1 hour and asked dietary to scale back to 50 cc/hr.Marland Kitchen of note he was receiving his oral contrast through his J Tube.. nurse was instructed to stop and have patient drink his oral contrast as we are assess for anastomotic leak... will schedule zofran to avoid vomiting ID- leukocytosis improving, now on Zosyn and Diflucan.he had low grade temp yesterday afternoon, which has since resolved Cough- could be due to tube feeds refluxing, will monitor if remains persistent problem can try to add cough suppressant Hypokalemia- will supplement    LOS: 10 days    Ellwood Handler, PA-C 08/28/2022   Chart reviewed, patient examined, agree with above. He looks ok but still having purulent appearing drainage from JP and it keeps losing suction suggesting air leak from esophageal leak. His CXR this am shows worsening aeration in right chest which may be due to pleural effusion. He is afebrile on Zosyn. WBC down to 12.7 from 16 yesterday. He is coughing a lot. Chest CT to be done today.  He is drinking the contrast now.

## 2022-08-28 NOTE — Progress Notes (Signed)
NUTRITION NOTE RD working remotely.  Page received for patient with J-tube receiving Osmolite 1.5 @ 70 ml/hr and 60 ml Prosource TF20 once/day.  Patient was given oral contract via tube and is sharing with staff that he is feeling very full and like he may vomit. Request to RD for decreasing Osmolite 1.5 to 50 ml/hr via J-tube.  Ongoing concern for anastomotic leak. If leak is noted on imagining, plan is for OR on 11/13 for EGD and stent placement.  Change to tube feeding order made and communicated via secure chat with RD who has been covering patient's care.    Jarome Matin, MS, RD, LDN, CNSC Clinical Dietitian PRN/Relief staff On-call/weekend pager # available in [facility]

## 2022-08-28 NOTE — Progress Notes (Signed)
Mobility Specialist Progress Note:   08/28/22 1518  Mobility  Activity Ambulated with assistance in hallway  Level of Assistance Contact guard assist, steadying assist  Assistive Device Front wheel walker  Distance Ambulated (ft) 150 ft  Activity Response Tolerated well  Mobility Referral Yes  $Mobility charge 1 Mobility   Pt received in bed and agreeable. No complaints. Pt left sitting EOB with all needs met and call bell in reach.   Andrey Campanile Mobility Specialist Please contact via SecureChat or  Rehab office at [phone]

## 2022-08-29 ENCOUNTER — Encounter (HOSPITAL_COMMUNITY)
Admission: RE | Disposition: A | Discharge status: Home / Self Care | Payer: Self-pay | Source: Home / Self Care | Attending: Thoracic Surgery (Cardiothoracic Vascular Surgery)

## 2022-08-29 ENCOUNTER — Inpatient Hospital Stay (HOSPITAL_COMMUNITY): Payer: No Typology Code available for payment source

## 2022-08-29 ENCOUNTER — Other Ambulatory Visit: Payer: Self-pay

## 2022-08-29 ENCOUNTER — Encounter (HOSPITAL_COMMUNITY): Payer: Self-pay | Admitting: Thoracic Surgery (Cardiothoracic Vascular Surgery)

## 2022-08-29 ENCOUNTER — Inpatient Hospital Stay (HOSPITAL_COMMUNITY): Payer: No Typology Code available for payment source | Admitting: Certified Registered"

## 2022-08-29 DIAGNOSIS — K9189 Other postprocedural complications and disorders of digestive system: Secondary | ICD-10-CM

## 2022-08-29 HISTORY — PX: ESOPHAGOGASTRODUODENOSCOPY: SHX5428

## 2022-08-29 HISTORY — PX: VIDEO ASSISTED THORACOSCOPY: SHX5073

## 2022-08-29 LAB — GLUCOSE, CAPILLARY
Glucose-Capillary: 84 mg/dL (ref 70–99)
Glucose-Capillary: 101 mg/dL — ABNORMAL HIGH (ref 70–99)
Glucose-Capillary: 126 mg/dL — ABNORMAL HIGH (ref 70–99)
Glucose-Capillary: 131 mg/dL — ABNORMAL HIGH (ref 70–99)

## 2022-08-29 SURGERY — EGD (ESOPHAGOGASTRODUODENOSCOPY)
Anesthesia: General | Laterality: Right

## 2022-08-29 MED ORDER — MIDAZOLAM HCL 2 MG/2ML IJ SOLN
INTRAMUSCULAR | Status: DC | PRN
  Administered 2022-08-29: 2 mg via INTRAVENOUS

## 2022-08-29 MED ORDER — SUGAMMADEX SODIUM 200 MG/2ML IV SOLN
INTRAVENOUS | Status: DC | PRN
  Administered 2022-08-29: 100 mg via INTRAVENOUS
  Administered 2022-08-29: 200 mg via INTRAVENOUS

## 2022-08-29 MED ORDER — ORAL CARE MOUTH RINSE: 15.0000 mL | Freq: Once | OROMUCOSAL | Status: AC

## 2022-08-29 MED ORDER — ONDANSETRON HCL 4 MG/2ML IJ SOLN
INTRAMUSCULAR | Status: DC | PRN
  Administered 2022-08-29: 4 mg via INTRAVENOUS

## 2022-08-29 MED ORDER — CHLORHEXIDINE GLUCONATE 0.12 % MT SOLN: 15.0000 mL | Freq: Once | OROMUCOSAL | Status: AC

## 2022-08-29 MED ORDER — PROPOFOL 10 MG/ML IV BOLUS
INTRAVENOUS | Status: AC
  Filled 2022-08-29: qty 20

## 2022-08-29 MED ORDER — ACETAMINOPHEN 10 MG/ML IV SOLN
INTRAVENOUS | Status: AC
  Filled 2022-08-29: qty 100

## 2022-08-29 MED ORDER — FENTANYL CITRATE (PF) 100 MCG/2ML IJ SOLN
INTRAMUSCULAR | Status: DC | PRN
  Administered 2022-08-29 (×2): 100 ug via INTRAVENOUS

## 2022-08-29 MED ORDER — ONDANSETRON HCL 4 MG/2ML IJ SOLN
INTRAMUSCULAR | Status: AC
  Filled 2022-08-29: qty 2

## 2022-08-29 MED ORDER — DEXAMETHASONE SODIUM PHOSPHATE 10 MG/ML IJ SOLN
INTRAMUSCULAR | Status: AC
  Filled 2022-08-29: qty 1

## 2022-08-29 MED ORDER — LIDOCAINE 2% (20 MG/ML) 5 ML SYRINGE
INTRAMUSCULAR | Status: AC
  Filled 2022-08-29: qty 5

## 2022-08-29 MED ORDER — INSULIN ASPART 100 UNIT/ML IJ SOLN
0.0000 [IU] | INTRAMUSCULAR | Status: DC
  Administered 2022-08-29 (×2): 2 [IU] via SUBCUTANEOUS
  Administered 2022-08-30 (×2): 4 [IU] via SUBCUTANEOUS
  Administered 2022-08-30: 2 [IU] via SUBCUTANEOUS
  Administered 2022-08-30 (×3): 4 [IU] via SUBCUTANEOUS
  Administered 2022-08-31 (×3): 2 [IU] via SUBCUTANEOUS
  Administered 2022-08-31 (×2): 4 [IU] via SUBCUTANEOUS
  Administered 2022-08-31 – 2022-09-02 (×5): 2 [IU] via SUBCUTANEOUS
  Administered 2022-09-02: 4 [IU] via SUBCUTANEOUS
  Administered 2022-09-02 – 2022-09-05 (×15): 2 [IU] via SUBCUTANEOUS
  Administered 2022-09-05: 1 [IU] via SUBCUTANEOUS
  Administered 2022-09-06: 4 [IU] via SUBCUTANEOUS
  Administered 2022-09-06 (×2): 2 [IU] via SUBCUTANEOUS

## 2022-08-29 MED ORDER — ONDANSETRON HCL 4 MG/2ML IJ SOLN
4.0000 mg | Freq: Four times a day (QID) | INTRAMUSCULAR | Status: DC | PRN
  Administered 2022-08-30: 4 mg via INTRAVENOUS
  Filled 2022-08-29: qty 2

## 2022-08-29 MED ORDER — DEXAMETHASONE SODIUM PHOSPHATE 10 MG/ML IJ SOLN
INTRAMUSCULAR | Status: DC | PRN
  Administered 2022-08-29: 10 mg via INTRAVENOUS

## 2022-08-29 MED ORDER — HYDROMORPHONE HCL 1 MG/ML IJ SOLN: 0.2500 mg | INTRAMUSCULAR | Status: DC | PRN

## 2022-08-29 MED ORDER — BUPIVACAINE HCL (PF) 0.5 % IJ SOLN
INTRAMUSCULAR | Status: AC
  Filled 2022-08-29: qty 30

## 2022-08-29 MED ORDER — FENTANYL CITRATE (PF) 250 MCG/5ML IJ SOLN
INTRAMUSCULAR | Status: AC
  Filled 2022-08-29: qty 5

## 2022-08-29 MED ORDER — SODIUM CHLORIDE 0.9 % IV BOLUS
500.0000 mL | Freq: Once | INTRAVENOUS | Status: AC
  Administered 2022-08-29: 500 mL via INTRAVENOUS

## 2022-08-29 MED ORDER — PROPOFOL 10 MG/ML IV BOLUS
INTRAVENOUS | Status: DC | PRN
  Administered 2022-08-29: 200 mg via INTRAVENOUS

## 2022-08-29 MED ORDER — MIDAZOLAM HCL 2 MG/2ML IJ SOLN
INTRAMUSCULAR | Status: AC
  Filled 2022-08-29: qty 2

## 2022-08-29 MED ORDER — BUPIVACAINE LIPOSOME 1.3 % IJ SUSP
INTRAMUSCULAR | Status: AC
  Filled 2022-08-29: qty 20

## 2022-08-29 MED ORDER — OSMOLITE 1.5 CAL PO LIQD: 1000.0000 mL | ORAL | Status: DC

## 2022-08-29 MED ORDER — LIDOCAINE 2% (20 MG/ML) 5 ML SYRINGE
INTRAMUSCULAR | Status: DC | PRN
  Administered 2022-08-29: 60 mg via INTRAVENOUS

## 2022-08-29 MED ORDER — OSMOLITE 1.5 CAL PO LIQD
1000.0000 mL | ORAL | Status: DC
  Administered 2022-08-29: 1000 mL
  Filled 2022-08-29: qty 1000

## 2022-08-29 MED ORDER — ROCURONIUM BROMIDE 10 MG/ML (PF) SYRINGE
PREFILLED_SYRINGE | INTRAVENOUS | Status: AC
  Filled 2022-08-29: qty 10

## 2022-08-29 MED ORDER — ROCURONIUM BROMIDE 10 MG/ML (PF) SYRINGE
PREFILLED_SYRINGE | INTRAVENOUS | Status: DC | PRN
  Administered 2022-08-29: 50 mg via INTRAVENOUS
  Administered 2022-08-29: 30 mg via INTRAVENOUS
  Administered 2022-08-29: 50 mg via INTRAVENOUS

## 2022-08-29 MED ORDER — LACTATED RINGERS IV SOLN: INTRAVENOUS | Status: DC

## 2022-08-29 MED ORDER — PHENYLEPHRINE 80 MCG/ML (10ML) SYRINGE FOR IV PUSH (FOR BLOOD PRESSURE SUPPORT)
PREFILLED_SYRINGE | INTRAVENOUS | Status: DC | PRN
  Administered 2022-08-29: 240 ug via INTRAVENOUS
  Administered 2022-08-29 (×3): 160 ug via INTRAVENOUS
  Administered 2022-08-29: 80 ug via INTRAVENOUS

## 2022-08-29 MED ORDER — ACETAMINOPHEN 10 MG/ML IV SOLN
INTRAVENOUS | Status: DC | PRN
  Administered 2022-08-29: 1000 mg via INTRAVENOUS

## 2022-08-29 MED ORDER — 0.9 % SODIUM CHLORIDE (POUR BTL) OPTIME
TOPICAL | Status: DC | PRN
  Administered 2022-08-29: 2000 mL

## 2022-08-29 MED ORDER — ENOXAPARIN SODIUM 40 MG/0.4ML IJ SOSY
40.0000 mg | PREFILLED_SYRINGE | Freq: Every day | INTRAMUSCULAR | Status: DC
  Administered 2022-08-30 – 2022-09-06 (×8): 40 mg via SUBCUTANEOUS
  Filled 2022-08-29 (×8): qty 0.4

## 2022-08-29 MED ORDER — PHENYLEPHRINE HCL-NACL 20-0.9 MG/250ML-% IV SOLN
INTRAVENOUS | Status: DC | PRN
  Administered 2022-08-29: 25 ug/min via INTRAVENOUS

## 2022-08-29 MED ORDER — CHLORHEXIDINE GLUCONATE 0.12 % MT SOLN
OROMUCOSAL | Status: AC
  Administered 2022-08-29: 15 mL via OROMUCOSAL
  Filled 2022-08-29: qty 15

## 2022-08-29 SURGICAL SUPPLY — 104 items
APPLICATOR TIP COSEAL (VASCULAR PRODUCTS) IMPLANT
APPLICATOR TIP EXT COSEAL (VASCULAR PRODUCTS) IMPLANT
BLADE CLIPPER SURG (BLADE) ×2 IMPLANT
BLADE SURG 11 STRL SS (BLADE) ×2 IMPLANT
BUTTON OLYMPUS DEFENDO 5 PIECE (MISCELLANEOUS) ×2 IMPLANT
CANISTER SUCT 3000ML PPV (MISCELLANEOUS) ×4 IMPLANT
CATH THORACIC 28FR (CATHETERS) IMPLANT
CATH THORACIC 36FR (CATHETERS) IMPLANT
CATH THORACIC 36FR RT ANG (CATHETERS) IMPLANT
CHLORAPREP W/TINT 26 (MISCELLANEOUS) ×2 IMPLANT
CLEANER TIP ELECTROSURG 2X2 (MISCELLANEOUS) ×2 IMPLANT
CNTNR URN SCR LID CUP LEK RST (MISCELLANEOUS) ×6 IMPLANT
CONN ST 1/4X3/8  BEN (MISCELLANEOUS)
CONN ST 1/4X3/8 BEN (MISCELLANEOUS) IMPLANT
CONN Y 3/8X3/8X3/8  BEN (MISCELLANEOUS)
CONN Y 3/8X3/8X3/8 BEN (MISCELLANEOUS) IMPLANT
CONT SPEC 4OZ STRL OR WHT (MISCELLANEOUS) ×6
COVER SURGICAL LIGHT HANDLE (MISCELLANEOUS) ×2 IMPLANT
DERMABOND ADVANCED .7 DNX12 (GAUZE/BANDAGES/DRESSINGS) IMPLANT
DISSECTOR BLUNT TIP ENDO 5MM (MISCELLANEOUS) IMPLANT
DRAIN CHANNEL 19F RND (DRAIN) IMPLANT
DRAIN CHANNEL 28F RND 3/8 FF (WOUND CARE) IMPLANT
DRAPE LAPAROSCOPIC ABDOMINAL (DRAPES) ×2 IMPLANT
DRAPE WARM FLUID 44X44 (DRAPES) ×2 IMPLANT
ELECT BLADE 4.0 EZ CLEAN MEGAD (MISCELLANEOUS) ×2
ELECT REM PT RETURN 9FT ADLT (ELECTROSURGICAL) ×2
ELECTRODE BLDE 4.0 EZ CLN MEGD (MISCELLANEOUS) ×2 IMPLANT
ELECTRODE REM PT RTRN 9FT ADLT (ELECTROSURGICAL) ×2 IMPLANT
EVACUATOR SILICONE 100CC (DRAIN) IMPLANT
GAUZE 4X4 16PLY ~~LOC~~+RFID DBL (SPONGE) ×2 IMPLANT
GAUZE SPONGE 4X4 12PLY STRL (GAUZE/BANDAGES/DRESSINGS) ×4 IMPLANT
GLOVE BIO SURGEON STRL SZ7 (GLOVE) ×2 IMPLANT
GLOVE BIO SURGEON STRL SZ7.5 (GLOVE) ×6 IMPLANT
GOWN STRL REUS W/ TWL LRG LVL3 (GOWN DISPOSABLE) ×6 IMPLANT
GOWN STRL REUS W/ TWL XL LVL3 (GOWN DISPOSABLE) ×4 IMPLANT
GOWN STRL REUS W/TWL LRG LVL3 (GOWN DISPOSABLE) ×6
GOWN STRL REUS W/TWL XL LVL3 (GOWN DISPOSABLE) ×4
GUIDEWIRE JAGWIRE PULMNRY .035 (MISCELLANEOUS) IMPLANT
GUIDEWIRE STIFF .035X260 (MISCELLANEOUS) IMPLANT
JAGWIRE PULMONARY .035 (MISCELLANEOUS) ×2
KIT BASIN OR (CUSTOM PROCEDURE TRAY) ×2 IMPLANT
KIT SUCTION CATH 14FR (SUCTIONS) ×2 IMPLANT
KIT TURNOVER KIT B (KITS) ×2 IMPLANT
MARKER SKIN DUAL TIP RULER LAB (MISCELLANEOUS) ×2 IMPLANT
NDL HYPO 25GX1X1/2 BEV (NEEDLE) IMPLANT
NEEDLE HYPO 25GX1X1/2 BEV (NEEDLE) IMPLANT
NS IRRIG 1000ML POUR BTL (IV SOLUTION) ×10 IMPLANT
OIL SILICONE PENTAX (PARTS (SERVICE/REPAIRS)) IMPLANT
PACK CHEST (CUSTOM PROCEDURE TRAY) ×2 IMPLANT
PACK UNIVERSAL I (CUSTOM PROCEDURE TRAY) IMPLANT
PAD ARMBOARD 7.5X6 YLW CONV (MISCELLANEOUS) ×8 IMPLANT
PASSER SUT SWANSON 36MM LOOP (INSTRUMENTS) IMPLANT
SCISSORS LAP 5X35 DISP (ENDOMECHANICALS) IMPLANT
SEALANT PROGEL (MISCELLANEOUS) IMPLANT
SEALANT SURG COSEAL 4ML (VASCULAR PRODUCTS) IMPLANT
SEALANT SURG COSEAL 8ML (VASCULAR PRODUCTS) IMPLANT
SOL ANTI FOG 6CC (MISCELLANEOUS) IMPLANT
SPONGE T-LAP 18X18 ~~LOC~~+RFID (SPONGE) ×8 IMPLANT
STENT WALLFLEX ESOPH 23X125MM (Stent) IMPLANT
STOPCOCK 4 WAY LG BORE MALE ST (IV SETS) IMPLANT
SUT ETHILON 3 0 FSL (SUTURE) IMPLANT
SUT ETHILON O TP 1 (SUTURE) IMPLANT
SUT PROLENE 3 0 SH DA (SUTURE) IMPLANT
SUT PROLENE 4 0 RB 1 (SUTURE)
SUT PROLENE 4-0 RB1 .5 CRCL 36 (SUTURE) IMPLANT
SUT SILK  1 MH (SUTURE) ×4
SUT SILK 1 MH (SUTURE) ×2 IMPLANT
SUT SILK 1 TIES 10X30 (SUTURE) IMPLANT
SUT SILK 2 0SH CR/8 30 (SUTURE) IMPLANT
SUT SILK 3 0SH CR/8 30 (SUTURE) IMPLANT
SUT VIC AB 1 CTX 18 (SUTURE) IMPLANT
SUT VIC AB 1 CTX 36 (SUTURE)
SUT VIC AB 1 CTX36XBRD ANBCTR (SUTURE) IMPLANT
SUT VIC AB 2-0 CT1 27 (SUTURE) ×4
SUT VIC AB 2-0 CT1 TAPERPNT 27 (SUTURE) ×2 IMPLANT
SUT VIC AB 2-0 CTX 36 (SUTURE) IMPLANT
SUT VIC AB 2-0 UR6 27 (SUTURE) IMPLANT
SUT VIC AB 3-0 SH 27 (SUTURE) ×4
SUT VIC AB 3-0 SH 27X BRD (SUTURE) ×2 IMPLANT
SUT VIC AB 3-0 X1 27 (SUTURE) IMPLANT
SUT VICRYL 0 UR6 27IN ABS (SUTURE) ×4 IMPLANT
SUT VICRYL 2 TP 1 (SUTURE) IMPLANT
SWAB COLLECTION DEVICE MRSA (MISCELLANEOUS) IMPLANT
SWAB CULTURE ESWAB REG 1ML (MISCELLANEOUS) IMPLANT
SYR 10ML LL (SYRINGE) IMPLANT
SYR 20ML ECCENTRIC (SYRINGE) ×2 IMPLANT
SYR 20ML LL LF (SYRINGE) IMPLANT
SYR 50ML LL SCALE MARK (SYRINGE) IMPLANT
SYSTEM SAHARA CHEST DRAIN ATS (WOUND CARE) ×2 IMPLANT
TAPE CLOTH 4X10 WHT NS (GAUZE/BANDAGES/DRESSINGS) ×2 IMPLANT
TAPE CLOTH SURG 4X10 WHT LF (GAUZE/BANDAGES/DRESSINGS) IMPLANT
TAPE UMBILICAL COTTON 1/8X30 (MISCELLANEOUS) ×2 IMPLANT
TIP APPLICATOR SPRAY EXTEND 16 (VASCULAR PRODUCTS) IMPLANT
TOWEL GREEN STERILE (TOWEL DISPOSABLE) ×4 IMPLANT
TOWEL GREEN STERILE FF (TOWEL DISPOSABLE) ×4 IMPLANT
TRAP SPECIMEN MUCUS 40CC (MISCELLANEOUS) IMPLANT
TRAY FOLEY SLVR 16FR LF STAT (SET/KITS/TRAYS/PACK) ×2 IMPLANT
TROCAR XCEL 12X100 BLDLESS (ENDOMECHANICALS) ×2 IMPLANT
TUBE CONNECTING 20X1/4 (TUBING) ×2 IMPLANT
TUBING ENDO SMARTCAP (MISCELLANEOUS) ×2 IMPLANT
TUBING EXTENTION W/L.L. (IV SETS) IMPLANT
TUNNELER SHEATH ON-Q 11GX8 DSP (PAIN MANAGEMENT) IMPLANT
UNDERPAD 30X36 HEAVY ABSORB (UNDERPADS AND DIAPERS) ×2 IMPLANT
WATER STERILE IRR 1000ML POUR (IV SOLUTION) ×6 IMPLANT

## 2022-08-29 NOTE — Op Note (Signed)
      [address]       [place] [address]             [phone]        08/29/2022  Patient:  Mindi Slicker Wickey Pre-Op Dx: Bernerd Limbo esophagectomy Anastomotic leak   Post-op Dx:  same Procedure: - Esophagogastroscopy - 125 mm covered esophageal stent placement - R VATS    Surgeon and Role:      * Quanetta Truss, Lucile Crater, MD - Primary Anesthesia  general EBL:  10 ml Blood Administration: none Specimen:  none   Counts: correct   Indications: 61yo male s/p Ivor Lewis esophagectomy developed an anastomotic leak over the weekend.  He was taken to the OR for stent placement, and wide drainage of his pleural space.  Findings: Anastomotic leak.  Backrow was intact.  Front row had a 1cm perforation.  Good stent coverage.  Organized empyema in the right chest.    Operative Technique: After the risks, benefits and alternatives were thoroughly discussed, the patient was brought to the operative theatre.  Anesthesia was induced. The patient was prepped and draped in normal sterile fashion.  An appropriate surgical pause was performed, and pre-operative antibiotics were dosed accordingly.  The gastroscope was advanced through the oropharynx into the cervical esophagus under direct visualization.  The scope was passed into the stomach.  The scope was then pulled back, and the esophageal mucosa was visualized.  A mucosal defect was noted at 25 cm from the incisors.     Next a Jag wire was passed through the gastroscope into the stomach with fluoroscopic guidance.  The esophageal stent was then placed over the wire, and positioned under fluoroscopy to cover the mucosal defect.    The patient was then placed in a left lateral decubitus position, and prepped and draped in normal sterile fashion.  The previous access incision was opened.  The scope was advanced, and lung was mobilized off of the chest wall.  All loculations were broken up, and the chest was copiously  irrigated.  Another chest tube was placed.  The lung was re-expanded.  The incision was then closed with several layers of absorbable suture.   The patient tolerated the procedure without any immediate complications, and was transferred to the PACU in stable condition.  Abdel Effinger Bary Leriche

## 2022-08-29 NOTE — Transfer of Care (Signed)
Immediate Anesthesia Transfer of Care Note  Patient: [name] [name]  Procedure(s) Performed: ESOPHAGOGASTRODUODENOSCOPY (EGD) VIDEO ASSISTED THORACOSCOPY (Right)  Patient Location: PACU  Anesthesia Type:General  Level of Consciousness: drowsy and patient cooperative  Airway & Oxygen Therapy: Patient Spontanous Breathing and non-rebreather face mask  Post-op Assessment: Report given to RN  Post vital signs: Reviewed and stable  Last Vitals:  Vitals Value Taken Time  BP 105/69   Temp    Pulse 115   Resp 29   SpO2 97     Last Pain:  Vitals:   08/29/22 1104  TempSrc:   PainSc: 4       Patients Stated Pain Goal: 0 (00/76/22 6333)  Complications: No notable events documented.

## 2022-08-29 NOTE — Plan of Care (Signed)
  Problem: Education: Goal: Knowledge of General Education information will improve Description: Including pain rating scale, medication(s)/side effects and non-pharmacologic comfort measures Outcome: Progressing   Problem: Health Behavior/Discharge Planning: Goal: Ability to manage health-related needs will improve Outcome: Progressing   Problem: Clinical Measurements: Goal: Ability to maintain clinical measurements within normal limits will improve Outcome: Progressing Goal: Will remain free from infection Outcome: Progressing Goal: Diagnostic test results will improve Outcome: Progressing Goal: Respiratory complications will improve Outcome: Progressing Goal: Cardiovascular complication will be avoided Outcome: Progressing   Problem: Activity: Goal: Risk for activity intolerance will decrease Outcome: Progressing   Problem: Nutrition: Goal: Adequate nutrition will be maintained Outcome: Progressing   Problem: Coping: Goal: Level of anxiety will decrease Outcome: Progressing   Problem: Elimination: Goal: Will not experience complications related to bowel motility Outcome: Progressing Goal: Will not experience complications related to urinary retention Outcome: Progressing   Problem: Pain Managment: Goal: General experience of comfort will improve Outcome: Progressing   Problem: Safety: Goal: Ability to remain free from injury will improve Outcome: Progressing   Problem: Skin Integrity: Goal: Risk for impaired skin integrity will decrease Outcome: Progressing   Problem: Education: Goal: Knowledge of the prescribed therapeutic regimen will improve Outcome: Progressing   Problem: Bowel/Gastric: Goal: Gastrointestinal status for postoperative course will improve Outcome: Progressing   Problem: Nutritional: Goal: Ability to achieve adequate nutritional intake will improve Outcome: Progressing   Problem: Clinical Measurements: Goal: Postoperative  complications will be avoided or minimized Outcome: Progressing   Problem: Respiratory: Goal: Ability to maintain a clear airway will improve Outcome: Progressing   Problem: Skin Integrity: Goal: Demonstration of wound healing without infection will improve Outcome: Progressing

## 2022-08-29 NOTE — Progress Notes (Addendum)
      [address]       [place] [address]             [phone]       11 Days Post-Op Procedure(s) (LRB): XI ROBOTIC ASSISTED IVOR LEWIS ESOPHAGECTOMY (N/A) XI ROBOTIC ASSISTED JEJUNOSTOMY TUBE PLACEMENT (N/A) ESOPHAGOGASTRODUODENOSCOPY (EGD) (N/A) INTERCOSTAL NERVE BLOCK (Right)  Subjective: Patient with a lot of coughing  Objective: Vital signs in last 24 hours: Temp:  [97.7 F (36.5 C)-99 F (37.2 C)] 97.7 F (36.5 C) (11/13 0324) Pulse Rate:  [91-115] 91 (11/12 2300) Cardiac Rhythm: Normal sinus rhythm (11/12 1900) Resp:  [17-23] 17 (11/12 2300) BP: (88-113)/(50-70) 94/62 (11/13 0324) SpO2:  [95 %-97 %] 97 % (11/12 2300) Weight:  [89.1 kg] 89.1 kg (11/13 0324)     Intake/Output from previous day: 11/12 0701 - 11/13 0700 In: 7212 [NG/GT:6596.7; IV Piggyback:615.3] Out: 960 [Urine:900; Drains:60]   Physical Exam:  Cardiovascular: RRR Pulmonary: Clear to auscultation on left and diminished on right Abdomen: Soft, sporadic bowel sounds present Extremities: No lower extremity edema. Wounds: Clean and dry.   J tube with drainage around fixator. JP drain: Light yellow drainage this am  Lab Results: CBC: Recent Labs    08/27/22 0020 08/28/22 0005  WBC 16.0* 12.7*  HGB 10.8* 9.6*  HCT 31.8* 28.8*  PLT 167 160    BMET:  Recent Labs    08/28/22 0005  NA 134*  K 3.1*  CL 99  CO2 26  GLUCOSE 148*  BUN 37*  CREATININE 0.94  CALCIUM 8.5*     PT/INR: No results for input(s): "LABPROT", "INR" in the last 72 hours. ABG:  INR: Will add last result for INR, ABG once components are confirmed Will add last 4 CBG results once components are confirmed  Assessment/Plan:  1. CV - ST at times 2.  Pulmonary - On 4-5 L of oxygen via [place]. JP drain with ser sanguinous drainage and 60 recorded for last 24 hours.  Encourage incentive spirometer.  3. GI-  NPO, tube feedings. CBGs 103/128/84. CT of chest/abdomen/pelvis showed defect at  esophageal-gastric anastomosis that communicates with right pleural space and secondary moderate volume, multifocal right-sided loculated hydropneumothorax. Per Dr.Manya Balash, will take to OR this afternoon for EGD, stent, and right VATS 4. Anemia-H and H increased to 11.4 and 31.9 this am 5. Thrombocytopenia resolved-Platelets this am up to 164,000 6. Leukocytosis-Last WBC down to 12,700. On Zosyn and Diflucan  [name] [name] 08/29/2022,7:05 AM    Agree with above OR today for EGD, esophageal stent, and right VATS  [name] [name] O Helmer Dull

## 2022-08-29 NOTE — Progress Notes (Signed)
Has a schedule  for Osmolite at 8 pm. [place]. Still waiting for them to send up.

## 2022-08-29 NOTE — Anesthesia Preprocedure Evaluation (Addendum)
Anesthesia Evaluation  Patient identified by MRN, date of birth, ID band Patient awake    Reviewed: Allergy & Precautions, H&P , NPO status , Patient's Chart, lab work & pertinent test results  Airway Mallampati: III  TM Distance: >3 FB Neck ROM: Full    Dental no notable dental hx. (+) Teeth Intact, Dental Advisory Given   Pulmonary neg pulmonary ROS   Pulmonary exam normal breath sounds clear to auscultation       Cardiovascular negative cardio ROS  Rhythm:Regular Rate:Tachycardia     Neuro/Psych negative neurological ROS  negative psych ROS   GI/Hepatic negative GI ROS, Neg liver ROS,,,  Endo/Other  negative endocrine ROS    Renal/GU negative Renal ROS  negative genitourinary   Musculoskeletal   Abdominal   Peds  Hematology negative hematology ROS (+)   Anesthesia Other Findings   Reproductive/Obstetrics negative OB ROS                             Anesthesia Physical Anesthesia Plan  ASA: 3  Anesthesia Plan: General   Post-op Pain Management: Ofirmev IV (intra-op)*   Induction: Intravenous  PONV Risk Score and Plan: 3 and Ondansetron, Dexamethasone and Midazolam  Airway Management Planned: Double Lumen EBT  Additional Equipment: Arterial line  Intra-op Plan:   Post-operative Plan: Extubation in OR  Informed Consent: I have reviewed the patients History and Physical, chart, labs and discussed the procedure including the risks, benefits and alternatives for the proposed anesthesia with the patient or authorized representative who has indicated his/her understanding and acceptance.     Dental advisory given  Plan Discussed with: CRNA  Anesthesia Plan Comments:        Anesthesia Quick Evaluation

## 2022-08-29 NOTE — Progress Notes (Signed)
Mobility Specialist Progress Note    08/29/22 1506  Mobility  Activity Off unit   Will f/u as appropriate.   Hildred Alamin Mobility Specialist  Please Psychologist, sport and exercise or Rehab Office at [phone]

## 2022-08-29 NOTE — Anesthesia Procedure Notes (Signed)
Procedure Name: Intubation Date/Time: 08/29/2022 2:17 PM  Performed by: Barrington Ellison, CRNAPre-anesthesia Checklist: Patient identified, Emergency Drugs available, Suction available and Patient being monitored Patient Re-evaluated:Patient Re-evaluated prior to induction Oxygen Delivery Method: Circle System Utilized Preoxygenation: Pre-oxygenation with 100% oxygen Induction Type: IV induction Ventilation: Mask ventilation without difficulty Laryngoscope Size: Mac and 4 Grade View: Grade I Tube type: Oral Endobronchial tube: Double lumen EBT, EBT position confirmed by fiberoptic bronchoscope and Left Number of attempts: 1 Airway Equipment and Method: Stylet and Oral airway Placement Confirmation: ETT inserted through vocal cords under direct vision, positive ETCO2 and breath sounds checked- equal and bilateral Secured at: 30 cm Tube secured with: Tape Dental Injury: Teeth and Oropharynx as per pre-operative assessment

## 2022-08-29 NOTE — Anesthesia Postprocedure Evaluation (Signed)
Anesthesia Post Note  Patient: [name] [name]  Procedure(s) Performed: ESOPHAGOGASTRODUODENOSCOPY (EGD) VIDEO ASSISTED THORACOSCOPY (Right)     Patient location during evaluation: PACU Anesthesia Type: General Level of consciousness: awake and alert Pain management: pain level controlled Vital Signs Assessment: post-procedure vital signs reviewed and stable Respiratory status: spontaneous breathing, nonlabored ventilation and respiratory function stable Cardiovascular status: blood pressure returned to baseline Postop Assessment: no apparent nausea or vomiting Anesthetic complications: no   No notable events documented.  Last Vitals:  Vitals:   08/29/22 1700 08/29/22 1718  BP: 98/72 106/68  Pulse: (!) 107 (!) 104  Resp: 20 20  Temp:  36.7 C  SpO2: 91% 93%    Last Pain:  Vitals:   08/29/22 1718  TempSrc: Oral  PainSc:                  Marthenia Rolling

## 2022-08-30 ENCOUNTER — Encounter (HOSPITAL_COMMUNITY): Payer: Self-pay | Admitting: Thoracic Surgery (Cardiothoracic Vascular Surgery)

## 2022-08-30 ENCOUNTER — Inpatient Hospital Stay (HOSPITAL_COMMUNITY): Payer: No Typology Code available for payment source

## 2022-08-30 LAB — BASIC METABOLIC PANEL
Anion gap: 9 (ref 5–15)
BUN: 29 mg/dL — ABNORMAL HIGH (ref 8–23)
CO2: 25 mmol/L (ref 22–32)
Calcium: 8.3 mg/dL — ABNORMAL LOW (ref 8.9–10.3)
Chloride: 103 mmol/L (ref 98–111)
Creatinine, Ser: 0.75 mg/dL (ref 0.61–1.24)
GFR, Estimated: >60 mL/min (ref >60)
Glucose, Bld: 211 mg/dL — ABNORMAL HIGH (ref 70–99)
Potassium: 5 mmol/L (ref 3.5–5.1)
Sodium: 137 mmol/L (ref 135–145)

## 2022-08-30 LAB — CBC
HCT: 26.8 % — ABNORMAL LOW (ref 39.0–52.0)
Hemoglobin: 8.9 g/dL — ABNORMAL LOW (ref 13.0–17.0)
MCH: 33.2 pg (ref 26.0–34.0)
MCHC: 33.2 g/dL (ref 30.0–36.0)
MCV: 100 fL (ref 80.0–100.0)
Platelets: 169 10*3/uL (ref 150–400)
RBC: 2.68 MIL/uL — ABNORMAL LOW (ref 4.22–5.81)
RDW: 13.1 % (ref 11.5–15.5)
WBC: 8.5 10*3/uL (ref 4.0–10.5)
nRBC: 0 % (ref 0.0–0.2)

## 2022-08-30 LAB — GLUCOSE, CAPILLARY
Glucose-Capillary: 159 mg/dL — ABNORMAL HIGH (ref 70–99)
Glucose-Capillary: 167 mg/dL — ABNORMAL HIGH (ref 70–99)
Glucose-Capillary: 173 mg/dL — ABNORMAL HIGH (ref 70–99)
Glucose-Capillary: 178 mg/dL — ABNORMAL HIGH (ref 70–99)
Glucose-Capillary: 181 mg/dL — ABNORMAL HIGH (ref 70–99)
Glucose-Capillary: 186 mg/dL — ABNORMAL HIGH (ref 70–99)
Glucose-Capillary: 194 mg/dL — ABNORMAL HIGH (ref 70–99)

## 2022-08-30 LAB — ALBUMIN: Albumin: 1.6 g/dL — ABNORMAL LOW (ref 3.5–5.0)

## 2022-08-30 LAB — PREALBUMIN: Prealbumin: <5 mg/dL — ABNORMAL LOW (ref 18–38)

## 2022-08-30 MED ORDER — OSMOLITE 1.5 CAL PO LIQD
1000.0000 mL | ORAL | Status: DC
  Administered 2022-08-30 – 2022-09-05 (×8): 1000 mL
  Filled 2022-08-30 (×2): qty 1000

## 2022-08-30 MED ORDER — FREE WATER
200.0000 mL | Status: DC
  Administered 2022-08-30 – 2022-09-02 (×19): 200 mL

## 2022-08-30 NOTE — Progress Notes (Signed)
Nutrition Follow-up  DOCUMENTATION CODES:   Non-severe (moderate) malnutrition in context of chronic illness  INTERVENTION:   Tube Feeding via J-tube: continuous 24 hour feedings Increase Osmolite 1.5 to 65 ml/hr  Pro-Source TF20 60 mL daily This provides 118 g of protein, 2420 kcals.1186 mL of free water  Increase free water flush to 200 mL q 4 hours. New TF order plus increased FWF provides 2386 mL of free water to meet hydration  Further recommendations to follow pending possible diet advancement  NUTRITION DIAGNOSIS:   Moderate Malnutrition related to chronic illness as evidenced by percent weight loss, mild fat depletion, mild muscle depletion (12% over 5 months).  Being addressed via TF   GOAL:   Patient will meet greater than or equal to 90% of their needs  Progressing  MONITOR:   Labs, Diet advancement, TF tolerance  REASON FOR ASSESSMENT:   Consult, New TF Enteral/tube feeding initiation and management  ASSESSMENT:   61 y.o. 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  11/02 XI robotic assisted Ivor Lewis Esophagectomy, pyloromyotomy, with J-tube placement 11/06 Esophagram: no leak, Diet advanced to Dysphagia 1, Thins. Chest tube removed 11/09 D/C held due to increased pain, fever, more tachycardia 11/10 NPO but then diet later resumed 11/11 NPO again 11/12 CT CAP: Defect at the site of the esophageal-gastric anastomosis, at the craniocaudal level of the carina. This communicates with the right pleural space.Secondary moderate volume, multifocal right-sided loculated hydropneumothorax.  11/13 OR: esophageal stent placement, R VATS  Developed anastomotic leak over the weekend requiring return trip to OR yesterday NPO  Current 83.9 kg  Noted TF turned down to 50 ml/hr over the weekend as pt complaining of nausea and fullness. Of note, during the time, pt receiving  contrast via J-tube. Pt also with an anastomotic leak into pleural space which may have been contributing to some of the symptoms.   Tolerating Osmolite 1.5 at 50 ml/hr continuous feeding currently. Pt agreeable to increase rate to meet nutritional needs given NPO status.   Pt and wife desire transition to 12 hour feedings again once diet advanced.   Pt and wife had lots of questions regarding CL diet and what liquids are consider CL. Pt currently NPO. All questions regarding CL answered for pt and wife but RD indicated that further recommendations post repeat swallow will follow.   Pt seems very eager to eat; requesting cranberry juice. RD discussed with pt that he is NPO per MD order post stent placement yesterday. Not safe for po at this time until repeat swallow performed and cleared for po by MD.  Wife seems concerned that pt is not a "rule follower" and might eat things post discharge that he is not supposed to eat. RD discussed in detail the risks of eating things not allowed based on MD order and advancement of diet. RD explained that this may delay his progression, even possibly end in readmission. Both seem to understand.   Labs: reviewed Meds: reviewed   Diet Order:   Diet Order             Diet NPO time specified  Diet effective now                   EDUCATION NEEDS:   Not appropriate for education at this time  Skin:  Skin Assessment: Skin Integrity Issues: Skin Integrity Issues:: Incisions Incisions: Right chest  Last BM:  11/11  Height:   Ht Readings from Last 1 Encounters:  08/29/22 '5\' 10"'$  (1.778 m)    Weight:   Wt Readings from Last 1 Encounters:  08/29/22 83.9 kg    Ideal Body Weight:  75.5 kg  BMI:  Body mass index is 26.54 kg/m.  Estimated Nutritional Needs:   Kcal:  0404-5913 kcals  Protein:  115-130 g  Fluid:  >/= 2L  Kerman Passey MS, RDN, LDN, CNSC Registered Dietitian 3 Clinical Nutrition RD Pager and On-Call Pager Number  Located in [place]

## 2022-08-30 NOTE — Progress Notes (Signed)
Mobility Specialist Progress Note    08/30/22 1355  Mobility  Activity Ambulated with assistance in hallway  Level of Assistance Contact guard assist, steadying assist  Assistive Device Front wheel walker  Distance Ambulated (ft) 260 ft  Activity Response Tolerated well  Mobility Referral Yes  $Mobility charge 1 Mobility   Pre-Mobility: 75 HR, 98% SpO2 During Mobility: 110 HR Post-Mobility: 89 HR, 95% SpO2  Pt received in bed and agreeable. No complaints on walk. On 4LO2. Returned to chair with call bell in reach.    Hildred Alamin Mobility Specialist  Please Psychologist, sport and exercise or Rehab Office at [phone]

## 2022-08-30 NOTE — Progress Notes (Addendum)
      [address]       [place] [address]             [phone]       1 Day Post-Op Procedure(s) (LRB): ESOPHAGOGASTRODUODENOSCOPY (EGD) (N/A) VIDEO ASSISTED THORACOSCOPY (Right)  Subjective: Patient states pain from yesterday's surgery not too bad.  Objective: Vital signs in last 24 hours: Temp:  [97.6 F (36.4 C)-98.7 F (37.1 C)] 97.7 F (36.5 C) (11/14 0331) Pulse Rate:  [87-110] 90 (11/14 0331) Cardiac Rhythm: Normal sinus rhythm (11/13 1900) Resp:  [16-27] 17 (11/14 0331) BP: (93-125)/(60-81) 125/73 (11/14 0331) SpO2:  [90 %-99 %] 92 % (11/14 0331) Weight:  [83.9 kg] 83.9 kg (11/13 1102)     Intake/Output from previous day: 11/13 0701 - 11/14 0700 In: 2057.6 [P.O.:100; I.V.:1000; NG/GT:318.3; IV Piggyback:639.3] Out: 2445 [Urine:2225; Drains:200; Blood:20]   Physical Exam:  Cardiovascular: RRR Pulmonary: Clear to auscultation on left and diminished at right base Abdomen: Soft, sporadic bowel sounds present Extremities: No lower extremity edema. Wounds: Clean and dry.   J tube with drainage around fixator. JP drains: Light yellow drainage in one bulb and bloody drainage in other bulb  Lab Results: CBC: Recent Labs    08/28/22 0005 08/30/22 0014  WBC 12.7* 8.5  HGB 9.6* 8.9*  HCT 28.8* 26.8*  PLT 160 169    BMET:  Recent Labs    08/28/22 0005 08/30/22 0014  NA 134* 137  K 3.1* 5.0  CL 99 103  CO2 26 25  GLUCOSE 148* 211*  BUN 37* 29*  CREATININE 0.94 0.75  CALCIUM 8.5* 8.3*     PT/INR: No results for input(s): "LABPROT", "INR" in the last 72 hours. ABG:  INR: Will add last result for INR, ABG once components are confirmed Will add last 4 CBG results once components are confirmed  Assessment/Plan:  1. CV - SR. 2.  Pulmonary - On 4 L of oxygen via [place]. Wean as able over next few days. JP drain with ser sanguinous drainage and 200 recorded since surgery.  CXR this am appears stable (right loculated collection,  probable small left pleural effusion). Encourage incentive spirometer and flutter valve.  3. GI-  Esophageal leak. NPO, tube feedings. CBGs 131/181/186.  4. Anemia-H and H decreased to 8.9 and 26.8 5.Lovenox for DVT prophylaxis 6. Leukocytosis resolved-WBC decreased this am to 8,500. Continue Zosyn and Diflucan  Sharalyn Ink ZimmermanPA-C 08/30/2022,7:04 AM   Looks better today Swallow on Monday Npo for now  [employer]

## 2022-08-31 LAB — COMPREHENSIVE METABOLIC PANEL
ALT: 28 U/L (ref 0–44)
AST: 29 U/L (ref 15–41)
Albumin: 1.5 g/dL — ABNORMAL LOW (ref 3.5–5.0)
Alkaline Phosphatase: 97 U/L (ref 38–126)
Anion gap: 6 (ref 5–15)
BUN: 31 mg/dL — ABNORMAL HIGH (ref 8–23)
CO2: 26 mmol/L (ref 22–32)
Calcium: 8.2 mg/dL — ABNORMAL LOW (ref 8.9–10.3)
Chloride: 104 mmol/L (ref 98–111)
Creatinine, Ser: 0.76 mg/dL (ref 0.61–1.24)
GFR, Estimated: >60 mL/min (ref >60)
Glucose, Bld: 202 mg/dL — ABNORMAL HIGH (ref 70–99)
Potassium: 5.4 mmol/L — ABNORMAL HIGH (ref 3.5–5.1)
Sodium: 136 mmol/L (ref 135–145)
Total Bilirubin: 0.6 mg/dL (ref 0.3–1.2)
Total Protein: 4.7 g/dL — ABNORMAL LOW (ref 6.5–8.1)

## 2022-08-31 LAB — GLUCOSE, CAPILLARY
Glucose-Capillary: 182 mg/dL — ABNORMAL HIGH (ref 70–99)
Glucose-Capillary: 159 mg/dL — ABNORMAL HIGH (ref 70–99)
Glucose-Capillary: 152 mg/dL — ABNORMAL HIGH (ref 70–99)
Glucose-Capillary: 135 mg/dL — ABNORMAL HIGH (ref 70–99)
Glucose-Capillary: 126 mg/dL — ABNORMAL HIGH (ref 70–99)
Glucose-Capillary: 80 mg/dL (ref 70–99)

## 2022-08-31 LAB — CBC
HCT: 23.5 % — ABNORMAL LOW (ref 39.0–52.0)
Hemoglobin: 8 g/dL — ABNORMAL LOW (ref 13.0–17.0)
MCH: 34.3 pg — ABNORMAL HIGH (ref 26.0–34.0)
MCHC: 34 g/dL (ref 30.0–36.0)
MCV: 100.9 fL — ABNORMAL HIGH (ref 80.0–100.0)
Platelets: 189 10*3/uL (ref 150–400)
RBC: 2.33 MIL/uL — ABNORMAL LOW (ref 4.22–5.81)
RDW: 13.3 % (ref 11.5–15.5)
WBC: 9.1 10*3/uL (ref 4.0–10.5)
nRBC: 0 % (ref 0.0–0.2)

## 2022-08-31 MED ORDER — SODIUM ZIRCONIUM CYCLOSILICATE 10 G PO PACK
10.0000 g | PACK | Freq: Once | ORAL | Status: AC
  Administered 2022-08-31: 10 g
  Filled 2022-08-31: qty 1

## 2022-08-31 NOTE — Progress Notes (Signed)
Mobility Specialist Progress Note    08/31/22 1203  Mobility  Activity Ambulated with assistance in hallway  Level of Assistance Contact guard assist, steadying assist  Assistive Device Front wheel walker  Distance Ambulated (ft) 260 ft  Activity Response Tolerated well  Mobility Referral Yes  $Mobility charge 1 Mobility   Pre-Mobility: 74 HR, 96% SpO2 During Mobility: 121 HR Post-Mobility: 93 HR, 95% SpO2  Pt received in bed and agreeable. No complaints on walk. Consistent dry cough throughout. Returned to bed with call bell in reach.    Hildred Alamin Mobility Specialist  Please Psychologist, sport and exercise or Rehab Office at [phone]

## 2022-08-31 NOTE — Progress Notes (Addendum)
      [address]       [place] [address]             [phone]       2 Days Post-Op Procedure(s) (LRB): ESOPHAGOGASTRODUODENOSCOPY (EGD) (N/A) VIDEO ASSISTED THORACOSCOPY (Right)  Subjective: Patient "bored".  Objective: Vital signs in last 24 hours: Temp:  [97.8 F (36.6 C)-98.1 F (36.7 C)] 98.1 F (36.7 C) (11/15 0421) Pulse Rate:  [69-91] 69 (11/15 0421) Cardiac Rhythm: Normal sinus rhythm (11/14 1900) Resp:  [17-20] 17 (11/15 0421) BP: (107-124)/(68-77) 116/77 (11/15 0421) SpO2:  [92 %-96 %] 96 % (11/15 0421)     Intake/Output from previous day: 11/14 0701 - 11/15 0700 In: 1420.3 [P.O.:100; NG/GT:770.3; IV Piggyback:550] Out: 2695 [Urine:2450; Drains:245]   Physical Exam:  Cardiovascular: RRR Pulmonary: Clear to auscultation on left and slightly diminished at right base Abdomen: Soft, sporadic bowel sounds present Extremities: No lower extremity edema. Wounds: Clean and dry.   J tube with drainage around fixator. JP drains: Milky drainage in one bulb and bloody drainage in other bulb  Lab Results: CBC: Recent Labs    08/30/22 0014 08/31/22 0025  WBC 8.5 9.1  HGB 8.9* 8.0*  HCT 26.8* 23.5*  PLT 169 189    BMET:  Recent Labs    08/30/22 0014 08/31/22 0025  NA 137 136  K 5.0 5.4*  CL 103 104  CO2 25 26  GLUCOSE 211* 202*  BUN 29* 31*  CREATININE 0.75 0.76  CALCIUM 8.3* 8.2*     PT/INR: No results for input(s): "LABPROT", "INR" in the last 72 hours. ABG:  INR: Will add last result for INR, ABG once components are confirmed Will add last 4 CBG results once components are confirmed  Assessment/Plan:  1. CV - SR. 2.  Pulmonary - On room air. 1 JP drain with bloody drainage and the other with milky drainage. 245 recorded last 24 hours. Encourage incentive spirometer and flutter valve.  3. GI-  Esophageal leak. NPO, tube feedings. CBGs 173/178/182. Per Dr. Kipp Brood, swallow study Monday 4. Anemia-H and H decreased  to 8 and 23.5 5.Lovenox for DVT prophylaxis 6. Leukocytosis resolved-WBC decreased this am to 8,500. Continue Zosyn and Diflucan 7. Hyperkalemia-potassium this am up to 5.4. Will give Lokelma and recheck in am  Donielle M ZimmermanPA-C 08/31/2022,7:11 AM    Agree with above Severe protein malnutrition On tube feeds Swallow tomorrow.  Christel Bai Bary Leriche

## 2022-08-31 NOTE — Progress Notes (Signed)
Mobility Specialist Progress Note    08/31/22 1616  Mobility  Activity Ambulated with assistance in hallway  Level of Assistance Contact guard assist, steadying assist  Assistive Device Front wheel walker  Distance Ambulated (ft) 260 ft  Activity Response Tolerated well  Mobility Referral Yes  $Mobility charge 1 Mobility   Post-Mobility: 76 HR, 96% SpO2  Pt received in bed and agreeable. No complaints on walk. Returned to bed with call bell in reach.    Hildred Alamin Mobility Specialist  Please Psychologist, sport and exercise or Rehab Office at [phone]

## 2022-09-01 ENCOUNTER — Inpatient Hospital Stay (HOSPITAL_COMMUNITY): Payer: No Typology Code available for payment source

## 2022-09-01 LAB — BASIC METABOLIC PANEL WITH GFR
Anion gap: 7 (ref 5–15)
BUN: 18 mg/dL (ref 8–23)
CO2: 25 mmol/L (ref 22–32)
Calcium: 8.6 mg/dL — ABNORMAL LOW (ref 8.9–10.3)
Chloride: 97 mmol/L — ABNORMAL LOW (ref 98–111)
Creatinine, Ser: 0.74 mg/dL (ref 0.61–1.24)
GFR, Estimated: >60 mL/min (ref >60)
Glucose, Bld: 128 mg/dL — ABNORMAL HIGH (ref 70–99)
Potassium: 4.8 mmol/L (ref 3.5–5.1)
Sodium: 129 mmol/L — ABNORMAL LOW (ref 135–145)

## 2022-09-01 LAB — BASIC METABOLIC PANEL
Anion gap: 6 (ref 5–15)
BUN: 23 mg/dL (ref 8–23)
CO2: 28 mmol/L (ref 22–32)
Calcium: 8.7 mg/dL — ABNORMAL LOW (ref 8.9–10.3)
Chloride: 103 mmol/L (ref 98–111)
Creatinine, Ser: 0.85 mg/dL (ref 0.61–1.24)
GFR, Estimated: >60 mL/min (ref >60)
Glucose, Bld: 118 mg/dL — ABNORMAL HIGH (ref 70–99)
Potassium: 5.1 mmol/L (ref 3.5–5.1)
Sodium: 137 mmol/L (ref 135–145)

## 2022-09-01 LAB — GLUCOSE, CAPILLARY
Glucose-Capillary: 106 mg/dL — ABNORMAL HIGH (ref 70–99)
Glucose-Capillary: 116 mg/dL — ABNORMAL HIGH (ref 70–99)
Glucose-Capillary: 116 mg/dL — ABNORMAL HIGH (ref 70–99)
Glucose-Capillary: 128 mg/dL — ABNORMAL HIGH (ref 70–99)
Glucose-Capillary: 139 mg/dL — ABNORMAL HIGH (ref 70–99)

## 2022-09-01 MED ORDER — IOHEXOL 300 MG/ML  SOLN
100.0000 mL | Freq: Once | INTRAMUSCULAR | Status: AC | PRN
  Administered 2022-09-01: 100 mL via ORAL

## 2022-09-01 MED ORDER — CALCIUM GLUCONATE-NACL 1-0.675 GM/50ML-% IV SOLN
1.0000 g | Freq: Once | INTRAVENOUS | Status: AC
  Administered 2022-09-01: 1000 mg via INTRAVENOUS
  Filled 2022-09-01: qty 50

## 2022-09-01 MED ORDER — ORAL CARE MOUTH RINSE: 15.0000 mL | OROMUCOSAL | Status: DC | PRN

## 2022-09-01 MED ORDER — PHENYLEPHRINE HCL-NACL 20-0.9 MG/250ML-% IV SOLN
INTRAVENOUS | Status: AC
  Filled 2022-09-01: qty 500

## 2022-09-01 MED ORDER — PROPOFOL 1000 MG/100ML IV EMUL
INTRAVENOUS | Status: AC
  Filled 2022-09-01: qty 200

## 2022-09-01 MED ORDER — SODIUM ZIRCONIUM CYCLOSILICATE 10 G PO PACK
10.0000 g | PACK | Freq: Once | ORAL | Status: AC
  Administered 2022-09-01: 10 g
  Filled 2022-09-01: qty 1

## 2022-09-01 NOTE — Progress Notes (Addendum)
      [address]       [place] [address]             [phone]       3 Days Post-Op Procedure(s) (LRB): ESOPHAGOGASTRODUODENOSCOPY (EGD) (N/A) VIDEO ASSISTED THORACOSCOPY (Right)  Subjective: Patient walked two times yesterday. He has less coughing since last surgery. He has no complaints this am.  Objective: Vital signs in last 24 hours: Temp:  [97.8 F (36.6 C)-98.2 F (36.8 C)] 98.2 F (36.8 C) (11/16 0334) Pulse Rate:  [61-92] 91 (11/16 0334) Cardiac Rhythm: Normal sinus rhythm (11/15 1900) Resp:  [15-22] 15 (11/16 0334) BP: (114-137)/(65-79) 125/79 (11/16 0334) SpO2:  [92 %-97 %] 96 % (11/16 0334) Weight:  [89.7 kg] 89.7 kg (11/16 0334)     Intake/Output from previous day: 11/15 0701 - 11/16 0700 In: 100 [P.O.:100] Out: 3292 [Urine:3105; Drains:187]   Physical Exam:  Cardiovascular: RRR Pulmonary: Clear to auscultation on left and diminished at right base Abdomen: Soft, non tender, bowel sounds present Extremities: No lower extremity edema. Wounds: Clean and dry.   J tube with light yellowish drainage around fixator. JP drains: One bulb is empty this am and bloody drainage in other bulb  Lab Results: CBC: Recent Labs    08/30/22 0014 08/31/22 0025  WBC 8.5 9.1  HGB 8.9* 8.0*  HCT 26.8* 23.5*  PLT 169 189    BMET:  Recent Labs    08/31/22 0025 09/01/22 0014  NA 136 137  K 5.4* 5.1  CL 104 103  CO2 26 28  GLUCOSE 202* 118*  BUN 31* 23  CREATININE 0.76 0.85  CALCIUM 8.2* 8.7*     PT/INR: No results for input(s): "LABPROT", "INR" in the last 72 hours. ABG:  INR: Will add last result for INR, ABG once components are confirmed Will add last 4 CBG results once components are confirmed  Assessment/Plan:  1. CV - SR. 2.  Pulmonary - On room air. 1 JP drain with bloody drainage and the other with milky drainage (this am bulb empty). 187 recorded last 24 hours. Encourage incentive spirometer and flutter valve.  3. GI-   Esophageal leak. NPO, tube feedings. CBGs 126/80/116. Per Dr. Kipp Brood, likely swallow study today 4. Anemia-Last H and H decreased to 8 and 23.5 5.Lovenox for DVT prophylaxis 6. ID-continue Zosyn, Diflucan 7. Hyperkalemia-potassium this am slightly decreased to 5.1. Will give Lokelma again and recheck in am 8. Severe protein malnutrition-nutrition's recommendations being followed. Will check Albumin over weekend 9. Hypocalcemia-calcium this am up to 8.7. Will give Ca gluconate  Makylee Sanborn M ZimmermanPA-C 09/01/2022,7:01 AM

## 2022-09-01 NOTE — Discharge Summary (Incomplete)
[address]       [place] [address]             [phone]                Physician Discharge Summary  Patient ID: [name] [name] MRN: [id number] DOB/AGE: 63-24-1962 63 y.o.   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 male referred by Dr. 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  He recently presented with dysphagia and intentional weight loss in April 2023.  Subsequently this restriction.  He subsequently underwent upper endoscopy with biopsy along with endoscopic ultrasound which confirmed the diagnosis.  He is able to tolerate a regular diet with occasional dysphagia.  He has started an exercise program follow-up last year and has intentional been losing weight.   He completed his neoadjuvant chemotherapy and radiation a month ago.  He comes in today to discuss results from his PET/CT.  He is able to tolerate a diet and has been able to put on some weight.   Hospital course:   The patient  was admitted electively on 08/18/2022 at which time he underwent robotic assisted Ivor Lewis esophagectomy and placement of jejunostomy tube.  Patient tolerated procedure well and was taken to the postanesthesia care unit in stable condition.  He was kept n.p.o. and supported with tube feeding by way of the jejunostomy tube that was gradually advanced to the goal rate and well-tolerated.  CBGs were checked because of tube feedings. He had significant pain in the early days after surgery that improved significantly with the addition of a PCA pump.  DVT prophylaxis was managed with mobilization and daily subcutaneous enoxaparin.  The initial checks x-ray showed mild and expected free intra-abdominal air.  By the third postoperative day, this was improving radiographically. He tolerated tube feedings and these were up to 60 ml/hr as of 11/06. NG tube was removed on 11/06. Esophagram was done on 11/07 and showed no evidence or post op leak. He was put on a Dysphagia I diet, which he tolerated. Tube feedings were then changed to night time only so as to encourage him to take po during the day. He had thrombocytopenia post op. Platelets on 11/07 were up to 117,000. He became more tachycardic, had a fever to 100 and was profusely sweating on 11/09. Initially, in the am we were going to discharge him;however, this was not done to the aforementioned. CBC  on 11/10 showed WBC 16,600. He was started on Zosyn. Chest x ray showed no pneumothorax, cardiomegaly, bibasilar airspace disease increased from previously with probable small pleural effusions.  His JP drain developed brown milky drainage.  He was made NPO accept for ice chips and sips of liquid.  His tube feeds were resumed in a continuous fashion.  It was felt there was a high probability of anastomotic leak.  CT of the chest and abdomen was obtained with oral contrast and showed defect at esophageal-gastric anastomosis that communicates with right pleural space and  secondary moderate volume, multifocal right-sided loculated .  Diflucan was started. The patient developed abdominal fullness, nausea, and persistent coughing.  His tube feeds were reduced. He returned to the OR on 11/13 in order to undergo an EGD, stent, and right VATS. WBC decreased to 8500 and he was no longer tachycardic. He had hyperkalemia on 11/15 and was given Lokelma via J tube. He was given it again as potassium was 5.1 on 11/15. Nutrition evaluated and recommendations were followed accordingly. Esophagram was done on 11/16 and showed ***.     Consults: None   Significant Diagnostic Studies:    Narrative & Impression  CLINICAL DATA:  Prior esophagectomy   EXAM: PORTABLE CHEST 1 VIEW   COMPARISON:  Chest x-ray dated August 15, 2022   FINDINGS: Right chest wall port unchanged in position. Enteric tube partially seen coursing below the diaphragm. Two right-sided chest tubes. Cardiac and mediastinal contours within normal limits for AP technique. Pneumomediastinum pneumoperitoneum. Left-greater-than-right basilar opacities, likely due to atelectasis. No large pleural effusion evidence of pneumothorax.   IMPRESSION: 1. Two right-sided chest tubes in place. No evidence of pneumothorax. 2. Pneumomediastinum and pneumoperitoneum, likely postsurgical.     Electronically Signed   By: Yetta Glassman M.D.   On: 08/18/2022 16:00    Narrative & Impression  CLINICAL DATA:  Patient with history of distal esophageal cancer s/p Ivor Lewis esophagectomy 08/18/22, request for esophagram to evaluate for post operative leak prior to advancing oral intake.   EXAM: ESOPHAGUS/BARIUM SWALLOW/TABLET STUDY   TECHNIQUE: Single contrast examination was performed using Omnipaque 300. This exam was performed by Candiss Norse, PA-C, and was supervised and interpreted by Nelson Chimes, MD.   FLUOROSCOPY: Radiation Exposure Index (as provided by the fluoroscopic device): 1 minute 42  seconds 798.09 micro gray meter squared.   COMPARISON:  None Available.   FINDINGS: Swallowing: Appears normal. No vestibular penetration or aspiration seen.   Pharynx: Unremarkable.   Esophagus: Diffusely dilated esophagus. Post surgical changes of the distal esophagus and stomach. No leak noted in supine LPO, RPO and AP projections.   Esophageal motility: Within normal limits for post operative state.   Hiatal Hernia: None.   Gastroesophageal reflux: None visualized.   Ingested 13 mm barium tablet: Not given   Other: None.   IMPRESSION: Water soluble contrast esophagram without evidence of post operative leak.     Electronically Signed   By: Nelson Chimes M.D.   On: 08/22/2022 10:11    CLINICAL DATA:  Status post esophagectomy 08/18/2022 and chest tube removal   EXAM: PORTABLE CHEST 1 VIEW   COMPARISON:  Chest radiograph dated 08/21/2022   FINDINGS: Lines/tubes:   Interval removal of 1 right-sided chest tube. An additional right-sided chest tube remains.   Right chest wall port tip projects over the right atrium.   Chest: Low lung volumes with persistent bibasilar atelectasis.   Pleura: No definite pneumothorax. Trace right pleural effusion. Persistent  pneumoperitoneum.   Heart/mediastinum: Enlarged cardiomediastinal silhouette is likely projectional. Decreased pneumopericardium.   Bones: No acute osseous abnormality.   IMPRESSION: 1. Interval removal of 1 right-sided chest tube. An additional right-sided chest tube remains. 2. No definite pneumothorax.  Trace right pleural effusion. 3. Persistent pneumoperitoneum. 4. Persistent bibasilar atelectasis.     Electronically Signed   By: Darrin Nipper M.D.   On: 08/23/2022 10:03   Treatments: Surgery: Esophagoscopy - Robotic assisted laparoscopy - Robotic assisted thoracoscopy - Ivor-Lewis esophagectomy - Pyloromyotomy - Laparoscopic jejunostomy tube placement 46F - Intercostal nerve block by Dr.  Kipp Brood on 08/18/2022   Discharge Exam: Blood pressure 125/79, pulse 91, temperature 98.2 F (36.8 C), temperature source Oral, resp. rate 15, height '5\' 10"'$  (1.778 m), weight 89.7 kg, SpO2 96 %.   Cardiovascular: RRR Pulmonary: Clear to auscultation bilaterally Abdomen: Soft, J tube site mostly clean and dry, sporadic bowel sounds present. Extremities: No lower extremity edema. Wounds: Clean and dry.  No erythema or signs of infection. JP drain: Sero sanguineous drainage   Discharge Medications:   Allergies as of 09/01/2022   No Known Allergies    Med Rec must be completed prior to using this [facility]***              Durable Medical Equipment  (From admission, onward)                 Start     Ordered    08/26/22 1317   For home use only DME Tube feeding  Once       Comments: Osmolite 1.5 1610 ml over 14 hours daily via pump.   08/26/22 1317                  Follow-up Information       [facility] Follow up.   Why: tube feeding supplies Contact information: Home infusion [phone]            Lajuana Matte, MD. Go on 09/02/2022.   Specialty: Cardiothoracic Surgery Contact information: [address] [place] [address] [place]. Follow up.   Why: Agency will call you to set up apt times Contact information: [place] [address] [phone]                   Lars Pinks PA-C 11/**/2023

## 2022-09-02 ENCOUNTER — Ambulatory Visit: Payer: No Typology Code available for payment source | Admitting: Thoracic Surgery (Cardiothoracic Vascular Surgery)

## 2022-09-02 LAB — GLUCOSE, CAPILLARY
Glucose-Capillary: 153 mg/dL — ABNORMAL HIGH (ref 70–99)
Glucose-Capillary: 139 mg/dL — ABNORMAL HIGH (ref 70–99)
Glucose-Capillary: 141 mg/dL — ABNORMAL HIGH (ref 70–99)
Glucose-Capillary: 163 mg/dL — ABNORMAL HIGH (ref 70–99)
Glucose-Capillary: 149 mg/dL — ABNORMAL HIGH (ref 70–99)
Glucose-Capillary: 131 mg/dL — ABNORMAL HIGH (ref 70–99)

## 2022-09-02 MED ORDER — FREE WATER
150.0000 mL | Status: DC
  Administered 2022-09-02 – 2022-09-06 (×22): 150 mL

## 2022-09-02 NOTE — Progress Notes (Addendum)
      [address]       [place], [address]             [phone]       4 Days Post-Op Procedure(s) (LRB): ESOPHAGOGASTRODUODENOSCOPY (EGD) (N/A) VIDEO ASSISTED THORACOSCOPY (Right)  Subjective: Patient did not walk yesterday but plans to do so today. He denies nausea.  Objective: Vital signs in last 24 hours: Temp:  [98.3 F (36.8 C)-99.1 F (37.3 C)] 99 F (37.2 C) (11/17 0743) Pulse Rate:  [93-106] 106 (11/17 0743) Cardiac Rhythm: Sinus tachycardia (11/16 1955) Resp:  [18-24] 24 (11/17 0743) BP: (106-121)/(65-78) 117/76 (11/17 0743) SpO2:  [91 %-95 %] 94 % (11/17 0743)     Intake/Output from previous day: 11/16 0701 - 11/17 0700 In: 7153 [SJ/GG:8366; IV Piggyback:324] Out: 4130 [Urine:4075; Drains:55]   Physical Exam:  Cardiovascular: RRR Pulmonary: Clear to auscultation on left and diminished at right base Abdomen: Soft, non tender, bowel sounds present Extremities: No lower extremity edema. Wounds: Clean and dry.  J tube with light yellowish drainage around fixator. JP drains: One bulb has minimal milky drainage and bloody drainage in other bulb  Lab Results: CBC: Recent Labs    08/31/22 0025  WBC 9.1  HGB 8.0*  HCT 23.5*  PLT 189    BMET:  Recent Labs    09/01/22 0014 09/01/22 0805  NA 137 129*  K 5.1 4.8  CL 103 97*  CO2 28 25  GLUCOSE 118* 128*  BUN 23 18  CREATININE 0.85 0.74  CALCIUM 8.7* 8.6*     PT/INR: No results for input(s): "LABPROT", "INR" in the last 72 hours. ABG:  INR: Will add last result for INR, ABG once components are confirmed Will add last 4 CBG results once components are confirmed  Assessment/Plan:  1. CV - Tachycardic this am with HR 110's. 2.  Pulmonary - On room air. 1 JP drain with bloody drainage and the other with milky drainage (scant output). 55 recorded last 24 hours. Encourage incentive spirometer and flutter valve.  3. GI-  Esophageal leak.  Tube feedings. CBGs 116/153/139.  Esophagram done yesterday showed persistent esophageal leak, with contrast entering the stomach and then appearing to migrate proximally along the outer stent wall and through the previously identified anastomotic defect into the right hemithorax. Patient to remain NPO 4. Anemia-Last H and H decreased to 8 and 23.5 5.Lovenox for DVT prophylaxis 6. ID-continue Zosyn, Diflucan 7. Hyperkalemia-potassium this am slightly decreased to 5.1. Will give Lokelma again and recheck in am 8. Severe protein malnutrition-nutrition's recommendations being followed. Will check Albumin over weekend 9. Hypocalcemia-calcium  8.7 11/16. Given Ca gluconate Re check in am  Donielle M ZimmermanPA-C 09/02/2022,8:07 AM   Agree with above Continue tube feeds, and NPO Will discuss with GI  Lajuana Matte

## 2022-09-02 NOTE — Progress Notes (Signed)
Mobility Specialist Progress Note    09/02/22 1335  Mobility  Activity Ambulated with assistance in hallway  Level of Assistance Contact guard assist, steadying assist  Assistive Device Front wheel walker  Distance Ambulated (ft) 260 ft  Activity Response Tolerated well  Mobility Referral Yes  $Mobility charge 1 Mobility   Pre-Mobility: 104 HR, 97% SpO2 Post-Mobility: 120 HR  Pt received in bed and agreeable. No complaints on walk. Returned to chair with call bell in reach.    Hildred Alamin Mobility Specialist  Please Psychologist, sport and exercise or Rehab Office at [phone]

## 2022-09-02 NOTE — Progress Notes (Addendum)
Brief Nutrition Follow-up:  Noted esophagram yesterday with persistent esophageal leak Tachycardic this AM  Per Dr. Kipp Brood plan to continue TF and pt to remain NPO  Pt currently tolerating Osmolite 1.5 at 65 ml/hr via J-tube   Constipation has resolved with +BM today  Interventions:  Tube Feeding via J-tube:  Osmolite 1.5 to 65 ml/hr with Pro-Source TF20 60 mL daily This provides 118 g of protein, 2420 kcals.1186 mL of free water Free water flush of 150 mL q 4 hours: total free water 2086 mL (does not include additional free water with med administration etc)  Kerman Passey MS, RDN, LDN, CNSC Registered Dietitian 3 Clinical Nutrition RD Pager and On-Call Pager Number Located in [place]

## 2022-09-02 NOTE — Discharge Summary (Signed)
[address]       [place] [address]             [phone]    Physician Discharge Summary  Patient ID: [name] [name] MRN: [id number] DOB/AGE: [birth year] 61 y.o.  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[place]) 04/12/2022   Esophageal dysphagia 02/10/2022   History of colonic polyps 02/10/2022     Discharge Diagnoses:  Patient Active Problem List   Diagnosis Date Noted   Empyema ([place]) 09/05/2022   Malnutrition of moderate degree 08/19/2022   Esophageal cancer ([place]) 08/18/2022   History of esophagectomy 08/18/2022   Chemotherapy induced neutropenia ([place]) 07/05/2022   Port-A-Cath in place 05/11/2022   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 male referred by Dr. 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  He recently presented with dysphagia and intentional weight loss in April 2023.  Subsequently this restriction.  He subsequently underwent upper endoscopy with biopsy along with endoscopic ultrasound which confirmed the diagnosis.  He is able to tolerate a regular diet with occasional dysphagia.  He has started an exercise program follow-up last year and has intentional been losing weight.   He completed his neoadjuvant chemotherapy and radiation a month ago.  He comes in today to discuss results from his PET/CT.  He is able to tolerate a diet and has been able to put on some weight.  Hospital  course:  The patient was admitted electively on 08/18/2022 at which time he underwent robotic assisted Ivor Lewis esophagectomy and placement of jejunostomy tube.  Patient tolerated procedure well and was taken to the postanesthesia care unit in stable condition.  He was kept n.p.o. and supported with tube feeding by way of the jejunostomy tube that was gradually advanced to the goal rate and well-tolerated.  CBGs were checked because of tube feedings. He had significant pain in the early days after surgery that improved significantly with the addition of a PCA pump.  DVT prophylaxis was managed with mobilization and daily subcutaneous enoxaparin.  The initial checks x-ray showed mild and expected free intra-abdominal air.  By the third postoperative day, this was improving radiographically. He tolerated tube feedings and these were up to 60 ml/hr as of 11/06. NG tube was removed on 11/06. Esophagram was done on 11/07 and showed no evidence or post op leak. He was put on a Dysphagia I diet, which he tolerated. Tube feedings were then changed to night time only so as to encourage him to take po during the day. He had thrombocytopenia post op. Platelets on 11/07 were up to 117,000. He became more tachycardic, had a fever to 100 and was profusely sweating on 11/09. Initially, in the am we were going to discharge him;however, this was not done to the aforementioned. CBC on 11/10 showed WBC 16,600. He was started on Zosyn. Chest x ray showed no pneumothorax, cardiomegaly, bibasilar airspace disease increased from previously with probable small pleural  effusions.  His JP drain developed brown milky drainage.  He was made NPO accept for ice chips and sips of liquid.  His tube feeds were resumed in a continuous fashion.  It was felt there was a high probability of anastomotic leak.  CT of the chest and abdomen was obtained with oral contrast and showed defect at esophageal-gastric anastomosis that communicates with right  pleural space and secondary moderate volume, multifocal right-sided loculated .  Diflucan was started. The patient developed abdominal fullness, nausea, and persistent coughing.  His tube feeds were reduced. He returned to the OR on 11/13 in order to undergo an EGD, stent, and right VATS. WBC decreased to 8500 and he was no longer tachycardic. He had hyperkalemia on 11/15 and was given Lokelma via J tube. He was given it again as potassium was 5.1 on 11/15. Nutrition evaluated and recommendations were followed accordingly. Esophagram was done on 11/16 and showed a persistent esophageal leak with contrast entering the stomach and then appearing to migrate proximally along the outer stent wall and through the previously identified anastomotic defect into the right hemithorax. Patient remained NPO and tube feedings were continued. Pre albumin is 10 and albumin was checked on 11/20 and it was 1.7. Patient wanting to go home. ID consult was obtained and they transitioned patient to oral ABX.  He was instructed to remain NPO, tube feedings to be arranged, HHRN. All wounds are clean, dry, healing without signs of infection. He has had good oxygenation on room air. He is felt surgically stable for discharge today.  Consults: None  Significant Diagnostic Studies:  Narrative & Impression  CLINICAL DATA:  Prior esophagectomy   EXAM: PORTABLE CHEST 1 VIEW   COMPARISON:  Chest x-ray dated August 15, 2022   FINDINGS: Right chest wall port unchanged in position. Enteric tube partially seen coursing below the diaphragm. Two right-sided chest tubes. Cardiac and mediastinal contours within normal limits for AP technique. Pneumomediastinum pneumoperitoneum. Left-greater-than-right basilar opacities, likely due to atelectasis. No large pleural effusion evidence of pneumothorax.   IMPRESSION: 1. Two right-sided chest tubes in place. No evidence of pneumothorax. 2. Pneumomediastinum and pneumoperitoneum, likely  postsurgical.     Electronically Signed   By: Yetta Glassman M.D.   On: 08/18/2022 16:00    Narrative & Impression  CLINICAL DATA:  Patient with history of distal esophageal cancer s/p Ivor Lewis esophagectomy 08/18/22, request for esophagram to evaluate for post operative leak prior to advancing oral intake.   EXAM: ESOPHAGUS/BARIUM SWALLOW/TABLET STUDY   TECHNIQUE: Single contrast examination was performed using Omnipaque 300. This exam was performed by Candiss Norse, PA-C, and was supervised and interpreted by Nelson Chimes, MD.   FLUOROSCOPY: Radiation Exposure Index (as provided by the fluoroscopic device): 1 minute 42 seconds 798.09 micro gray meter squared.   COMPARISON:  None Available.   FINDINGS: Swallowing: Appears normal. No vestibular penetration or aspiration seen.   Pharynx: Unremarkable.   Esophagus: Diffusely dilated esophagus. Post surgical changes of the distal esophagus and stomach. No leak noted in supine LPO, RPO and AP projections.   Esophageal motility: Within normal limits for post operative state.   Hiatal Hernia: None.   Gastroesophageal reflux: None visualized.   Ingested 13 mm barium tablet: Not given   Other: None.   IMPRESSION: Water soluble contrast esophagram without evidence of post operative leak.     Electronically Signed   By: Nelson Chimes M.D.   On: 08/22/2022 10:11    CLINICAL DATA:  Status  post esophagectomy 08/18/2022 and chest tube removal   EXAM: PORTABLE CHEST 1 VIEW   COMPARISON:  Chest radiograph dated 08/21/2022   FINDINGS: Lines/tubes:   Interval removal of 1 right-sided chest tube. An additional right-sided chest tube remains.   Right chest wall port tip projects over the right atrium.   Chest: Low lung volumes with persistent bibasilar atelectasis.   Pleura: No definite pneumothorax. Trace right pleural effusion. Persistent pneumoperitoneum.   Heart/mediastinum: Enlarged cardiomediastinal  silhouette is likely projectional. Decreased pneumopericardium.   Bones: No acute osseous abnormality.   IMPRESSION: 1. Interval removal of 1 right-sided chest tube. An additional right-sided chest tube remains. 2. No definite pneumothorax.  Trace right pleural effusion. 3. Persistent pneumoperitoneum. 4. Persistent bibasilar atelectasis.     Electronically Signed   By: Darrin Nipper M.D.   On: 08/23/2022 10:03  Treatments: Surgery: Esophagoscopy - Robotic assisted laparoscopy - Robotic assisted thoracoscopy - Ivor-Lewis esophagectomy - Pyloromyotomy - Laparoscopic jejunostomy tube placement 21F - Intercostal nerve block by Dr. Kipp Brood on 08/18/2022  Discharge Exam: Blood pressure 121/69, pulse 98, temperature 98.2 F (36.8 C), temperature source Oral, resp. rate (!) 25, height '5\' 10"'$  (1.778 m), weight 89.7 kg, SpO2 96 %.  Gen: NAD Heart: RRR Lungs: mildly diminished left base Ext: no edema Abd: non tender, non-distended Wounds: clean and dry   Discharge Medications:   Allergies as of 09/06/2022   No Known Allergies      Medication List     TAKE these medications    acetaminophen 160 MG/5ML solution Commonly known as: TYLENOL Place 20.3 mLs (650 mg total) into feeding tube every 8 (eight) hours as needed for mild pain.   amoxicillin-clavulanate 400-57 MG/5ML suspension Commonly known as: AUGMENTIN Place 10.9 mLs (875 mg total) into feeding tube every 12 (twelve) hours.   feeding supplement (OSMOLITE 1.5 CAL) Liqd Place 90 mL/hr into feeding tube daily. For 18 hours each day.   feeding supplement (PROSource TF20) liquid Place 60 mLs into feeding tube daily.   fluconazole 40 MG/ML suspension Commonly known as: DIFLUCAN Place 10 mLs (400 mg total) into feeding tube daily. Start taking on: September 07, 2022   HYDROcodone-acetaminophen 7.5-325 mg/15 ml solution Commonly known as: HYCET Place 10 mLs into feeding tube every 6 (six) hours as needed for  severe pain.   sodium chloride flush 0.9 % Soln Commonly known as: NS Place 30 mLs into feeding tube every 8 (eight) hours.               Durable Medical Equipment  (From admission, onward)           Start     Ordered   09/05/22 1239  For home use only DME Tube feeding  Once       Comments: 7 cartons (1680 mL) of Osmolite 1.5 daily   09/05/22 1239   08/26/22 1317  For home use only DME Tube feeding  Once       Comments: Osmolite 1.5 1610 ml over 14 hours daily via pump.   08/26/22 1317            Follow-up Information     [facility] Follow up.   Why: tube feeding supplies Contact information: Home infusion [phone]        Lajuana Matte, MD. Go on 10/04/2022.   Specialty: Cardiothoracic Surgery Why: Appointment time is at 10:40 am Contact information: [address] [place] [address] [phone]  [facility]. Follow up.   Why: Agency will call you to set up apt times Contact information: [place] [address] [phone]                 Signed:  Ellwood Handler, PA-C 09/06/2022, 10:43 AM

## 2022-09-02 NOTE — TOC Progression Note (Addendum)
Transition of Care [facility]) - Progression Note    Patient Details  Name: [name] [name] MRN: [id number] Date of Birth: Jan 26, 1961  Transition of Care [employer]) CM/SW Contact  Zenon Mayo, RN Phone Number: 09/02/2022, 1:37 PM  Clinical Narrative:    Per PA esophagram shows continued leak, patient is on continuous tube feeds. Carolynn Sayers will need new order for continuous tube feeds, per Ledon Snare will also check with Nutritionist. TOC following.      Barriers to Discharge: No Barriers Identified  Expected Discharge Plan and Services           Expected Discharge Date: 08/25/22               DME Arranged: Tube feeding, Tube feeding pump ([facility]/ Carolynn Sayers)   Date DME Agency Contacted: 08/24/22 Time DME Agency Contacted: 1000 Representative spoke with at DME Agency: Carolynn Sayers [facility] Arranged: RN [place] Agency: [place] Date [place]: 08/25/22 Time [place]: 64 Representative spoke with at [place]: Amy   Social Determinants of Health ([place]) Interventions Housing Interventions: Patient Refused  Readmission Risk Interventions     No data to display

## 2022-09-03 ENCOUNTER — Other Ambulatory Visit: Payer: Self-pay

## 2022-09-03 LAB — BASIC METABOLIC PANEL
Anion gap: 8 (ref 5–15)
BUN: 16 mg/dL (ref 8–23)
CO2: 25 mmol/L (ref 22–32)
Calcium: 8.2 mg/dL — ABNORMAL LOW (ref 8.9–10.3)
Chloride: 98 mmol/L (ref 98–111)
Creatinine, Ser: 0.79 mg/dL (ref 0.61–1.24)
GFR, Estimated: >60 mL/min (ref >60)
Glucose, Bld: 131 mg/dL — ABNORMAL HIGH (ref 70–99)
Potassium: 4.1 mmol/L (ref 3.5–5.1)
Sodium: 131 mmol/L — ABNORMAL LOW (ref 135–145)

## 2022-09-03 LAB — GLUCOSE, CAPILLARY
Glucose-Capillary: 147 mg/dL — ABNORMAL HIGH (ref 70–99)
Glucose-Capillary: 142 mg/dL — ABNORMAL HIGH (ref 70–99)
Glucose-Capillary: 133 mg/dL — ABNORMAL HIGH (ref 70–99)
Glucose-Capillary: 128 mg/dL — ABNORMAL HIGH (ref 70–99)
Glucose-Capillary: 153 mg/dL — ABNORMAL HIGH (ref 70–99)

## 2022-09-03 MED ORDER — CALCIUM GLUCONATE-NACL 2-0.675 GM/100ML-% IV SOLN
2.0000 g | Freq: Once | INTRAVENOUS | Status: AC
  Administered 2022-09-03: 2000 mg via INTRAVENOUS
  Filled 2022-09-03: qty 100

## 2022-09-03 NOTE — Progress Notes (Signed)
   09/03/22 1200  Mobility  Activity Refused mobility   Mobility Specialist Progress Note  Pt refused mobility d/t "family coming to see him soon". Will f/u as able.    [name] [name] Mobility Specialist

## 2022-09-03 NOTE — Progress Notes (Signed)
      [address]       [place] [address]             [phone]       5 Days Post-Op Procedure(s) (LRB): ESOPHAGOGASTRODUODENOSCOPY (EGD) (N/A) VIDEO ASSISTED THORACOSCOPY (Right)  Subjective: Patient   Objective: Vital signs in last 24 hours: Temp:  [98.4 F (36.9 C)-100 F (37.8 C)] 98.6 F (37 C) (11/18 0732) Pulse Rate:  [94-118] 108 (11/18 0732) Cardiac Rhythm: (P) Normal sinus rhythm (11/18 0700) Resp:  [15-21] 20 (11/18 0732) BP: (99-119)/(65-71) 115/69 (11/18 0732) SpO2:  [92 %-94 %] 92 % (11/18 0732)     Intake/Output from previous day: 11/17 0701 - 11/18 0700 In: 3069.5 [P.O.:240; NG/GT:1600.1; IV Piggyback:1229.5] Out: 2368 [Urine:2305; Drains:63]   Physical Exam:  Cardiovascular: Slightly tachycardic Pulmonary: Clear to auscultation on left and diminished at right base Abdomen: Soft, non tender, bowel sounds present Extremities: No lower extremity edema. Wounds: Clean and dry.  J tube with light yellowish drainage around fixator. JP drains: One bulb has minimal milky drainage and bloody drainage in other bulb  Lab Results: CBC: No results for input(s): "WBC", "HGB", "HCT", "PLT" in the last 72 hours.  BMET:  Recent Labs    09/01/22 0805 09/03/22 0000  NA 129* 131*  K 4.8 4.1  CL 97* 98  CO2 25 25  GLUCOSE 128* 131*  BUN 18 16  CREATININE 0.74 0.79  CALCIUM 8.6* 8.2*     PT/INR: No results for input(s): "LABPROT", "INR" in the last 72 hours. ABG:  INR: Will add last result for INR, ABG once components are confirmed Will add last 4 CBG results once components are confirmed  Assessment/Plan:  1. CV - Tachycardic again this am. 2.  Pulmonary - On room air. 1 JP drain with bloody drainage and the other with milky drainage (scant output). 63 recorded last 24 hours. Encourage incentive spirometer and flutter valve.  3. GI-  Esophageal leak.  Tube feedings. CBGs 131/147/142. Esophagram done 11/16 showed persistent  esophageal leak, with contrast entering the stomach and then appearing to migrate proximally along the outer stent wall and through the previously identified anastomotic defect into the right hemithorax. Patient to remain NPO and Dr. Kipp Brood to discuss with GI 4. Anemia-Last H and H decreased to 8 and 23.5 5.Lovenox for DVT prophylaxis 6. ID-continue Zosyn, Diflucan 7. Severe protein malnutrition-nutrition's recommendations being followed. Will check Albumin over weekend 8. Hypocalcemia-calcium  8.2 11/16. Give Ca gluconate   Nedim Oki M ZimmermanPA-C 09/03/2022,10:00 AM

## 2022-09-04 ENCOUNTER — Inpatient Hospital Stay (HOSPITAL_COMMUNITY): Payer: No Typology Code available for payment source

## 2022-09-04 LAB — CBC WITH DIFFERENTIAL/PLATELET
Abs Immature Granulocytes: 0.09 10*3/uL — ABNORMAL HIGH (ref 0.00–0.07)
Basophils Absolute: 0 10*3/uL (ref 0.0–0.1)
Basophils Relative: 0 %
Eosinophils Absolute: 0.1 10*3/uL (ref 0.0–0.5)
Eosinophils Relative: 1 %
HCT: 25.5 % — ABNORMAL LOW (ref 39.0–52.0)
Hemoglobin: 8.4 g/dL — ABNORMAL LOW (ref 13.0–17.0)
Immature Granulocytes: 1 %
Lymphocytes Relative: 3 %
Lymphs Abs: 0.4 10*3/uL — ABNORMAL LOW (ref 0.7–4.0)
MCH: 32.9 pg (ref 26.0–34.0)
MCHC: 32.9 g/dL (ref 30.0–36.0)
MCV: 100 fL (ref 80.0–100.0)
Monocytes Absolute: 0.9 10*3/uL (ref 0.1–1.0)
Monocytes Relative: 6 %
Neutro Abs: 12.4 10*3/uL — ABNORMAL HIGH (ref 1.7–7.7)
Neutrophils Relative %: 89 %
Platelets: 278 10*3/uL (ref 150–400)
RBC: 2.55 MIL/uL — ABNORMAL LOW (ref 4.22–5.81)
RDW: 13.3 % (ref 11.5–15.5)
WBC: 14 10*3/uL — ABNORMAL HIGH (ref 4.0–10.5)
nRBC: 0 % (ref 0.0–0.2)

## 2022-09-04 LAB — ALBUMIN: Albumin: 1.7 g/dL — ABNORMAL LOW (ref 3.5–5.0)

## 2022-09-04 LAB — GLUCOSE, CAPILLARY
Glucose-Capillary: 104 mg/dL — ABNORMAL HIGH (ref 70–99)
Glucose-Capillary: 119 mg/dL — ABNORMAL HIGH (ref 70–99)
Glucose-Capillary: 123 mg/dL — ABNORMAL HIGH (ref 70–99)
Glucose-Capillary: 124 mg/dL — ABNORMAL HIGH (ref 70–99)
Glucose-Capillary: 128 mg/dL — ABNORMAL HIGH (ref 70–99)
Glucose-Capillary: 149 mg/dL — ABNORMAL HIGH (ref 70–99)

## 2022-09-04 NOTE — Progress Notes (Signed)
   09/04/22 1100  Mobility  Activity Ambulated with assistance in hallway  Level of Assistance Minimal assist, patient does 75% or more  Assistive Device Front wheel walker  Distance Ambulated (ft) 120 ft  Activity Response Tolerated well  Mobility Referral Yes  $Mobility charge 1 Mobility   Mobility Specialist Progress Note  Pt in bed and agreeable. Had no c/o pain throughout ambulation. Required multiple cues to not pull pull on IV. Returned to bed w/ all needs met and call bell in reach.   Lucious Groves Mobility Specialist

## 2022-09-04 NOTE — Discharge Instructions (Signed)
Diet: NPO and tube feedings continuous

## 2022-09-04 NOTE — Progress Notes (Addendum)
      [address]       [place] [address]             [phone]       6 Days Post-Op Procedure(s) (LRB): ESOPHAGOGASTRODUODENOSCOPY (EGD) (N/A) VIDEO ASSISTED THORACOSCOPY (Right)  Subjective: Patient without complaints.  Objective: Vital signs in last 24 hours: Temp:  [98.5 F (36.9 C)-101.2 F (38.4 C)] 98.5 F (36.9 C) (11/19 0749) Pulse Rate:  [95-111] 96 (11/19 0749) Cardiac Rhythm: Normal sinus rhythm (11/18 2000) Resp:  [17-20] 20 (11/19 0749) BP: (102-128)/(66-71) 107/71 (11/19 0749) SpO2:  [94 %-96 %] 96 % (11/19 0749)     Intake/Output from previous day: 11/18 0701 - 11/19 0700 In: 1506.7 [P.O.:120; NG/GT:1386.7] Out: 1345 [Urine:1300; Drains:45]   Physical Exam:  Cardiovascular: RRR Pulmonary: Clear to auscultation on left and diminished at right base Abdomen: Soft, non tender, bowel sounds present Extremities: No lower extremity edema. Wounds: Clean and dry.  J tube with light yellowish drainage around fixator. JP drains: One bulb has minimal milky drainage and bloody drainage in other bulb  Lab Results: CBC: Recent Labs    09/04/22 0014  WBC 14.0*  HGB 8.4*  HCT 25.5*  PLT 278    BMET:  Recent Labs    09/03/22 0000  NA 131*  K 4.1  CL 98  CO2 25  GLUCOSE 131*  BUN 16  CREATININE 0.79  CALCIUM 8.2*     PT/INR: No results for input(s): "LABPROT", "INR" in the last 72 hours. ABG:  INR: Will add last result for INR, ABG once components are confirmed Will add last 4 CBG results once components are confirmed  Assessment/Plan:  1. CV - RRR. 2.  Pulmonary - On room air. 1 JP drain with bloody drainage and the other with milky drainage (scant output). 45 recorded last 24 hours. Encourage incentive spirometer and flutter valve.  3. GI-  Esophageal leak.  Tube feedings. CBGs 124/149/123. Esophagram done 11/16 showed persistent esophageal leak, with contrast entering the stomach and then appearing to migrate  proximally along the outer stent wall and through the previously identified anastomotic defect into the right hemithorax.  4. Anemia-Last H and H decreased to 8 and 23.5 5.Lovenox for DVT prophylaxis 6. ID-continue Zosyn, Diflucan 7. Severe protein malnutrition-nutrition's recommendations being followed. Will check pre albumin and CMP in am 8. As discussed with Dr. Kipp Brood, discharge planning for am  [name] [name] 09/04/2022,10:08 AM    Agree with above Continue tube feeds Will plan for D/C tomorrow  [name] [name]

## 2022-09-04 NOTE — Plan of Care (Signed)
  Problem: Education: Goal: Knowledge of General Education information will improve Description: Including pain rating scale, medication(s)/side effects and non-pharmacologic comfort measures Outcome: Progressing   Problem: Health Behavior/Discharge Planning: Goal: Ability to manage health-related needs will improve Outcome: Progressing   Problem: Clinical Measurements: Goal: Ability to maintain clinical measurements within normal limits will improve Outcome: Progressing Goal: Will remain free from infection Outcome: Progressing Goal: Diagnostic test results will improve Outcome: Progressing Goal: Respiratory complications will improve Outcome: Progressing Goal: Cardiovascular complication will be avoided Outcome: Progressing   Problem: Activity: Goal: Risk for activity intolerance will decrease Outcome: Progressing   Problem: Nutrition: Goal: Adequate nutrition will be maintained Outcome: Progressing   Problem: Coping: Goal: Level of anxiety will decrease Outcome: Progressing   Problem: Elimination: Goal: Will not experience complications related to bowel motility Outcome: Progressing Goal: Will not experience complications related to urinary retention Outcome: Progressing   Problem: Pain Managment: Goal: General experience of comfort will improve Outcome: Progressing   Problem: Safety: Goal: Ability to remain free from injury will improve Outcome: Progressing   Problem: Skin Integrity: Goal: Risk for impaired skin integrity will decrease Outcome: Progressing   Problem: Education: Goal: Knowledge of the prescribed therapeutic regimen will improve Outcome: Progressing   Problem: Bowel/Gastric: Goal: Gastrointestinal status for postoperative course will improve Outcome: Progressing   Problem: Nutritional: Goal: Ability to achieve adequate nutritional intake will improve Outcome: Progressing   Problem: Clinical Measurements: Goal: Postoperative  complications will be avoided or minimized Outcome: Progressing   Problem: Respiratory: Goal: Ability to maintain a clear airway will improve Outcome: Progressing   Problem: Skin Integrity: Goal: Demonstration of wound healing without infection will improve Outcome: Progressing   Problem: Education: Goal: Knowledge of disease or condition will improve Outcome: Progressing Goal: Knowledge of the prescribed therapeutic regimen will improve Outcome: Progressing   Problem: Activity: Goal: Risk for activity intolerance will decrease Outcome: Progressing   Problem: Cardiac: Goal: Will achieve and/or maintain hemodynamic stability Outcome: Progressing   Problem: Clinical Measurements: Goal: Postoperative complications will be avoided or minimized Outcome: Progressing   Problem: Respiratory: Goal: Respiratory status will improve Outcome: Progressing   Problem: Pain Management: Goal: Pain level will decrease Outcome: Progressing   Problem: Skin Integrity: Goal: Wound healing without signs and symptoms infection will improve Outcome: Progressing

## 2022-09-05 ENCOUNTER — Inpatient Hospital Stay: Payer: Self-pay

## 2022-09-05 DIAGNOSIS — J869 Pyothorax without fistula: Secondary | ICD-10-CM

## 2022-09-05 LAB — GLUCOSE, CAPILLARY
Glucose-Capillary: 106 mg/dL — ABNORMAL HIGH (ref 70–99)
Glucose-Capillary: 108 mg/dL — ABNORMAL HIGH (ref 70–99)
Glucose-Capillary: 121 mg/dL — ABNORMAL HIGH (ref 70–99)
Glucose-Capillary: 138 mg/dL — ABNORMAL HIGH (ref 70–99)
Glucose-Capillary: 147 mg/dL — ABNORMAL HIGH (ref 70–99)
Glucose-Capillary: 153 mg/dL — ABNORMAL HIGH (ref 70–99)
Glucose-Capillary: 156 mg/dL — ABNORMAL HIGH (ref 70–99)

## 2022-09-05 LAB — COMPREHENSIVE METABOLIC PANEL
ALT: 46 U/L — ABNORMAL HIGH (ref 0–44)
AST: 36 U/L (ref 15–41)
Albumin: 1.7 g/dL — ABNORMAL LOW (ref 3.5–5.0)
Alkaline Phosphatase: 110 U/L (ref 38–126)
Anion gap: 9 (ref 5–15)
BUN: 14 mg/dL (ref 8–23)
CO2: 24 mmol/L (ref 22–32)
Calcium: 8.6 mg/dL — ABNORMAL LOW (ref 8.9–10.3)
Chloride: 97 mmol/L — ABNORMAL LOW (ref 98–111)
Creatinine, Ser: 0.82 mg/dL (ref 0.61–1.24)
GFR, Estimated: >60 mL/min (ref >60)
Glucose, Bld: 122 mg/dL — ABNORMAL HIGH (ref 70–99)
Potassium: 4.5 mmol/L (ref 3.5–5.1)
Sodium: 130 mmol/L — ABNORMAL LOW (ref 135–145)
Total Bilirubin: 0.5 mg/dL (ref 0.3–1.2)
Total Protein: 5.9 g/dL — ABNORMAL LOW (ref 6.5–8.1)

## 2022-09-05 LAB — PREALBUMIN: Prealbumin: 10 mg/dL — ABNORMAL LOW (ref 18–38)

## 2022-09-05 MED ORDER — SODIUM CHLORIDE 0.9% FLUSH
10.0000 mL | Freq: Two times a day (BID) | INTRAVENOUS | Status: DC
  Administered 2022-09-05 – 2022-09-06 (×2): 10 mL

## 2022-09-05 MED ORDER — OSMOLITE 1.5 CAL PO LIQD: 90.0000 mL/h | ORAL | 30 refills | Status: DC

## 2022-09-05 MED ORDER — OSMOLITE 1.5 CAL PO LIQD
1000.0000 mL | ORAL | Status: DC
  Administered 2022-09-05 – 2022-09-06 (×2): 1000 mL
  Filled 2022-09-05: qty 1000

## 2022-09-05 MED ORDER — OSMOLITE 1.5 CAL PO LIQD: 1000.0000 mL | ORAL | 30 refills | Status: DC

## 2022-09-05 MED ORDER — SODIUM CHLORIDE 0.9% FLUSH: 10.0000 mL | INTRAVENOUS | Status: DC | PRN

## 2022-09-05 MED ORDER — FLUCONAZOLE IN SODIUM CHLORIDE 400-0.9 MG/200ML-% IV SOLN
400.0000 mg | INTRAVENOUS | Status: DC
  Filled 2022-09-05: qty 200

## 2022-09-05 MED ORDER — AMOXICILLIN-POT CLAVULANATE 400-57 MG/5ML PO SUSR
875.0000 mg | Freq: Two times a day (BID) | ORAL | Status: DC
  Administered 2022-09-05 – 2022-09-06 (×2): 875 mg
  Filled 2022-09-05 (×3): qty 10.9

## 2022-09-05 MED ORDER — FLUCONAZOLE 40 MG/ML PO SUSR
400.0000 mg | Freq: Every day | ORAL | Status: DC
  Administered 2022-09-05 – 2022-09-06 (×2): 400 mg
  Filled 2022-09-05 (×2): qty 10

## 2022-09-05 MED ORDER — CHLORHEXIDINE GLUCONATE CLOTH 2 % EX PADS
6.0000 | MEDICATED_PAD | Freq: Every day | CUTANEOUS | Status: DC
  Administered 2022-09-05 – 2022-09-06 (×2): 6 via TOPICAL

## 2022-09-05 NOTE — Progress Notes (Signed)
As per pt dr. Kipp Brood changed the 2 JP bulb this am. No output noted

## 2022-09-05 NOTE — Progress Notes (Addendum)
EKG done to check QTc (last done at end of October and on Diflucan and Zosyn, both potential QTc prolongation medications). QTc is 432. I am requesting an infectious disease consult for confirmation of antibiotics and doses (for esophageal leak). Nutrition has been consulted as well as per Dr. Kipp Brood, requests tube feedings be done nocturnally. I discussed with IV team and patient with a port so IV team will access so it is ready when patient is discharged. Patient, wife, and nurse all informed it may be tomorrow before everything is arranged.

## 2022-09-05 NOTE — Progress Notes (Signed)
Spoke with Lars Pinks PA in regards to PICC line placement. Patient has implanted port in which antibiotics can be given through. Plan is to access port  for this treatment.

## 2022-09-05 NOTE — Progress Notes (Addendum)
      [address]       [place] [address]             [phone]       7 Days Post-Op Procedure(s) (LRB): ESOPHAGOGASTRODUODENOSCOPY (EGD) (N/A) VIDEO ASSISTED THORACOSCOPY (Right)  Subjective: Patient without complaints and states he wants to go home. Tube feedings on gown and bed as apparatus became disconnected  Objective: Vital signs in last 24 hours: Temp:  [98.5 F (36.9 C)-100.1 F (37.8 C)] 98.9 F (37.2 C) (11/20 0414) Pulse Rate:  [94-109] 94 (11/20 0414) Cardiac Rhythm: Normal sinus rhythm (11/19 2020) Resp:  [20] 20 (11/20 0012) BP: (107-116)/(62-72) 108/65 (11/20 0414) SpO2:  [92 %-96 %] 92 % (11/20 0414)     Intake/Output from previous day: 11/19 0701 - 11/20 0700 In: 2106.3 [NG/GT:1425; IV Piggyback:681.3] Out: 7628 [Urine:1500; Drains:84]   Physical Exam:  Cardiovascular: RRR Pulmonary: Clear to auscultation on left and diminished at right base Abdomen: Soft, non tender, bowel sounds present Extremities: No lower extremity edema. Wounds: Clean and dry.  J tube with light yellowish drainage around fixator. JP drains: One bulb has minimal milky drainage and bloody drainage in other bulb  Lab Results: CBC: Recent Labs    09/04/22 0014  WBC 14.0*  HGB 8.4*  HCT 25.5*  PLT 278    BMET:  Recent Labs    09/03/22 0000 09/05/22 0510  NA 131* 130*  K 4.1 4.5  CL 98 97*  CO2 25 24  GLUCOSE 131* 122*  BUN 16 14  CREATININE 0.79 0.82  CALCIUM 8.2* 8.6*     PT/INR: No results for input(s): "LABPROT", "INR" in the last 72 hours. ABG:  INR: Will add last result for INR, ABG once components are confirmed Will add last 4 CBG results once components are confirmed  Assessment/Plan:  1. CV - RRR 2.  Pulmonary - On room air. 1 JP drain with bloody drainage and the other with milky drainage (scant output). 84 recorded last 24 hours. Encourage incentive spirometer and flutter valve.  3. GI-  Esophageal leak.  NPO (NO ice  chips), tube feedings. CBGs 104/147/106. Esophagram done 11/16 showed persistent esophageal leak, with contrast entering the stomach and then appearing to migrate proximally along the outer stent wall and through the previously identified anastomotic defect into the right hemithorax.  4. Anemia-Last H and H decreased to 8 and 23.5 5.Lovenox for DVT prophylaxis 6. ID-continue Zosyn, Diflucan. Per Dr. Kipp Brood, will have PICC placed 7. Severe protein malnutrition-nutrition's recommendations being followed. Prealbumin 10 and albumin remains low at 1.7 8. Patient wants to go home;possible discharge later today  Donielle M ZimmermanPA-C 09/05/2022,7:14 AM    Agree with above Nutritional status very poor Pt would like to be discharged Will keep stent in for 6 weeks  [place]

## 2022-09-05 NOTE — Progress Notes (Signed)
Nutrition Follow-up:  Noted possible discharge today. If not today, plan for tomorrow.   Pt will be discharged STRICT NPO as pt with persistent esophageal leak. No ice, no liquids, no meds. Reinforced importance of strict adherence to this with pt and his wife. Reminded them that MD will be the one to determine when pt is safe for diet advancement  Currently tolerating continuous feedings of Osmolite 1.5 at 65 ml/hr. After reassessing nutritional needs knowing that pt is at high nutritional risk and TF will be sole nutrition for now, plan to increase to Osmolite at 70 ml/hr (total of 1680 mL or 7 cartons of Osmolite 1.5 per day)  Plan to trial transition of intermittent feeding per pt and MD request. Pt and wife both agree that starting at a very high hourly rate on a 12 hour feeding may not be well tolerated. This did not work previously but this was also when pt had leak post esophagectomy; some of his "tolerance" may have been Both agreeable to starting with 18 hour feedings. Pt will have 6 hour break during the day when he will not be connected to feeding pump.  RD is aware that pt's goal is to be connected to feeding pump as little as possible. RD also reinforced the importance of adequate nutrition during this time.   Plan: Transition to 18 hour feedings Start: Osmolite 1.5 at 90 ml/hr over 18 hours per day. Family to select times for infusion (suggested time initially was TF to infuse from 2pm until 8am daily).   If pt tolerates 18 hour feedings at increased rate and would like to trial decreasing the time connected to the pump, RD provided pt and his wife infusion rate for  16, 14 or 12 hour infusions. RD also noted that if pt does tolerate intermittent feeding, ok for pt to resume 24 hour feedings at rate of 70 ml/hr. GOAL no matter what is for a total of 1680 mL which is equivalent to 7 cartons of Osmolite 1.5  Pt will require additional free water per tube and recommendations given to pt  and his wife.   RD provided pt and wife education with regards to J-tube feeding via pump. Provided individualized handout from the [employer] from the [employer] entitled "Pump Tube Feeding Instructions" which was tailored to patient's nutrition prescription.   RD to route note to outpatient oncology dietitian for follow-up post discharge as able.    Kerman Passey MS, RDN, LDN, CNSC Registered Dietitian 3 Clinical Nutrition RD Pager and On-Call Pager Number Located in [place]

## 2022-09-05 NOTE — Progress Notes (Signed)
Mobility Specialist Progress Note    09/05/22 0912  Mobility  Activity Refused mobility   Pt stated he is "going to pass today because I am going home".  [name] [name] Mobility Specialist  Please Psychologist, sport and exercise or Rehab Office at [phone]

## 2022-09-05 NOTE — TOC Progression Note (Addendum)
Transition of Care [facility]) - Progression Note    Patient Details  Name: [name] [name] MRN: [id number] Date of Birth: 10-12-1961  [facility]) CM/SW Contact  Zenon Mayo, RN Phone Number: 09/05/2022, 12:10 PM  Clinical Narrative:    Patient is on continuous tube feeds, has two jp drains, on iv abx.  If he goes home with iv abx Carolynn Sayers states he needs to have an ID consult in order to have an OPAT order for home, ID will need to be consulted.  The new order from Nutrition is 7 cartons (1680 mL) of Osmolite 1.5 daily if he goes home.  Pam notified of this new order.  Patient will be on po abx now .       Barriers to Discharge: No Barriers Identified  Expected Discharge Plan and Services           Expected Discharge Date: 08/25/22               DME Arranged: Tube feeding, Tube feeding pump ([facility]/ Carolynn Sayers)   Date DME Agency Contacted: 08/24/22 Time DME Agency Contacted: 1000 Representative spoke with at DME Agency: Carolynn Sayers [facility] Arranged: RN [place] Agency: [place] Date [place]: 08/25/22 Time [place]: 80 Representative spoke with at [place]: Amy   Social Determinants of Health ([place]) Interventions Housing Interventions: Patient Refused  Readmission Risk Interventions     No data to display

## 2022-09-05 NOTE — Consult Note (Signed)
[facility] for Infectious Disease    Date of Admission:  08/18/2022     Reason for Consult: empyema; perforated esophageo-gastric anastomosis    Referring Provider: Kipp Brood     Lines:  Right chest drain Right chest port  Abx: 11/10-c piptazo 11/11-c fluconazole  11/02 periop cefoxitin        Assessment: Esophageal cancer s/p neoadjuvant chemo with recent robotic assisted esophagectomy and primary anastomosis, complicated by anastomotic leak with right sided 2nd empyema, s/p stent 11/13 with drain and esophagogram with ongoing leak and placement of 2nd drain on 11/16  Serial cxr shows stability. Discussed with cts, ongoing leakage and anticipated around 6 weeks for seal  On empiric fluc/piptazo  Has j-tube and can do oral abx. Will keep abx for the 6 weeks  Plan: Stop iv abx Start amox-clav 875 mg po bid and continue fluconazole in PO form 400 mg daily Duration 6 weeks starting 11/16 until 12/28 Id clinic follow up 12/27 with me Discussed with primary team   Clinic Follow Up Appt:  @  [facility] [address], [place], [place] [address] Phone: [phone]   I spent 75 minute reviewing data/chart, and coordinating care and >50% direct face to face time providing counseling/discussing diagnostics/treatment plan with patient    ------------------------------------------------ Principal Problem:   Esophageal cancer ([place]) Active Problems:   History of esophagectomy   Malnutrition of moderate degree    HPI: [name] [name] is a 61 y.o. male dxed 01/2022 esophageal cancer s/p neoadjuvant chemoxrt with recent robotic assisted esophagectomy and primary anastomosis, complicated by anastomotic leak with right sided 2nd empyema, s/p stent 11/13 with drain and esophagogram with ongoing leak and placement of 2nd drain on 11/16  Brief time line: 11/02 esophagoscopy with robotic assisted laparoscopy/thoracoscopy with ivor-lewis esophagectomy,  pyloromyotomy, laparoscopic jejunostomy 11/13 right sided vats & esophagogastroscopy; 125 mm covered esophageal stent placement 11/16 esophagram showed persistent leak with contrast entering stomach then migrating proximally along stent wall into right hemithorax  There has been no culture this admission  11/19 xray chest showed resolved pneumothorax and stable pleural effusion  Patient's last fever was 11/18 with slight borderline temperature 11/19 with slight elevation of wbc  He is feeling well  He has been on piptazo and fluconazole empirically since the leak was found and sepsis onset around 11/09-11/10  There is stable dry cough  Jtube being used He has a port that was placed for chemo previously     Family History  Problem Relation Age of Onset   Cancer Maternal Grandmother        Breast cancer   Cancer Other        Maternal Cousin - breast cancer    Social History   Tobacco Use   Smoking status: Never    Passive exposure: Past   Smokeless tobacco: Never  Vaping Use   Vaping Use: Never used  Substance Use Topics   Alcohol use: Yes    Comment: 2-3 per week   Drug use: Never    No Known Allergies  Review of Systems: ROS All Other ROS was negative, except mentioned above   Past Medical History:  Diagnosis Date   Cancer ([place])    Esophageal Cancer   History of radiation therapy    Esophagus- 05/12/22-06/22/22- Dr. Gery Pray   Port-A-Cath in place 05/11/2022       Scheduled Meds:  Chlorhexidine Gluconate Cloth  6 each Topical Daily  enoxaparin (LOVENOX) injection  40 mg Subcutaneous Daily   feeding supplement (PROSource TF20)  60 mL Per Tube Daily   free water  150 mL Per Tube Q4H   insulin aspart  0-24 Units Subcutaneous Q4H   pantoprazole (PROTONIX) IV  40 mg Intravenous Q12H   sodium chloride flush  10-40 mL Intracatheter Q12H   Continuous Infusions:  feeding supplement (OSMOLITE 1.5 CAL) 1,000 mL (09/05/22 1340)   fluconazole  (DIFLUCAN) IV     piperacillin-tazobactam (ZOSYN)  IV 3.375 g (09/05/22 0523)   PRN Meds:.acetaminophen (TYLENOL) oral liquid 160 mg/5 mL, HYDROcodone-acetaminophen, metoprolol tartrate, morphine injection, ondansetron (ZOFRAN) IV, mouth rinse, phenol, sodium chloride flush   OBJECTIVE: Blood pressure 120/67, pulse 94, temperature 98.7 F (37.1 C), temperature source Oral, resp. rate (!) 23, height '5\' 10"'$  (1.778 m), weight 89.7 kg, SpO2 96 %.  Physical Exam  General/constitutional: no distress, pleasant HEENT: Normocephalic, PER, Conj Clear, EOMI, Oropharynx clear Neck supple CV: rrr no mrg Lungs: clear to auscultation, normal respiratory effort Abd: Soft, Nontender - jtube hooked up to tube feeding Ext: no edema Skin: abdomen port incision healed; dressign around chest drain clean/dry Neuro: nonfocal MSK: no peripheral joint swelling/tenderness/warmth; back spines nontender   Central line presence: right chest port site no erythema/tenderness   Lab Results Lab Results  Component Value Date   WBC 14.0 (H) 09/04/2022   HGB 8.4 (L) 09/04/2022   HCT 25.5 (L) 09/04/2022   MCV 100.0 09/04/2022   PLT 278 09/04/2022    Lab Results  Component Value Date   CREATININE 0.82 09/05/2022   BUN 14 09/05/2022   NA 130 (L) 09/05/2022   K 4.5 09/05/2022   CL 97 (L) 09/05/2022   CO2 24 09/05/2022    Lab Results  Component Value Date   ALT 46 (H) 09/05/2022   AST 36 09/05/2022   ALKPHOS 110 09/05/2022   BILITOT 0.5 09/05/2022      Microbiology: No results found for this or any previous visit (from the past 240 hour(s)).   Serology:    Imaging: If present, new imagings (plain films, ct scans, and mri) have been personally visualized and interpreted; radiology reports have been reviewed. Decision making incorporated into the Impression / Recommendations.  11/12 ct chest abd pelv 1. Status post partial esophagectomy and gastric pull-through. Defect at the site of the  esophageal-gastric anastomosis, at the craniocaudal level of the carina. This communicates with the right pleural space. 2. Secondary moderate volume, multifocal right-sided loculated hydropneumothorax. Right-sided airspace disease, primarily felt to represent compressive atelectasis. 3. Perihepatic gas and fluid, favored to be extension from the right hemithorax. No more caudal extraluminal gas or fluid identified. 4. Small volume pneumomediastinum. 5. New high mediastinal adenopathy, favored to be reactive. Recommend attention on follow-up. 6. Small left pleural effusion and compressive atelectasis. 7. Coronary artery atherosclerosis. Aortic Atherosclerosis  11/19 cxr 1. No visible pneumothorax today. 2. Right pleural fluid appears similar to the most recent prior study. 3. Streaky opacities in the hypoexpanded right lower lung field could be atelectasis or infiltrates. 4. Unchanged small left pleural effusion.  [name] [name], [facility] for Infectious [place] [phone] pager    09/05/2022, 2:12 PM

## 2022-09-06 ENCOUNTER — Other Ambulatory Visit (HOSPITAL_COMMUNITY): Payer: Self-pay

## 2022-09-06 ENCOUNTER — Encounter: Payer: Self-pay | Admitting: Hematology

## 2022-09-06 ENCOUNTER — Other Ambulatory Visit: Payer: Self-pay

## 2022-09-06 ENCOUNTER — Encounter: Payer: Self-pay | Admitting: Internal Medicine

## 2022-09-06 LAB — GLUCOSE, CAPILLARY
Glucose-Capillary: 159 mg/dL — ABNORMAL HIGH (ref 70–99)
Glucose-Capillary: 163 mg/dL — ABNORMAL HIGH (ref 70–99)
Glucose-Capillary: 104 mg/dL — ABNORMAL HIGH (ref 70–99)

## 2022-09-06 MED ORDER — FLUCONAZOLE 40 MG/ML PO SUSR: 400.0000 mg | Freq: Every day | ORAL | 0 refills | Status: AC

## 2022-09-06 MED ORDER — HYDROCODONE-ACETAMINOPHEN 7.5-325 MG/15ML PO SOLN: 10.0000 mL | Freq: Four times a day (QID) | ORAL | 0 refills | Status: DC | PRN

## 2022-09-06 MED ORDER — AMOXICILLIN-POT CLAVULANATE 400-57 MG/5ML PO SUSR: 875.0000 mg | Freq: Two times a day (BID) | ORAL | 0 refills | Status: DC

## 2022-09-06 MED ORDER — SODIUM CHLORIDE 0.9% FLUSH
30.0000 mL | Freq: Three times a day (TID) | INTRAVENOUS | 3 refills | Status: AC
  Filled 2022-09-06: qty 1200, 14d supply, fill #0

## 2022-09-06 MED ORDER — OSMOLITE 1.5 CAL PO LIQD: 90.0000 mL/h | ORAL | 30 refills | Status: DC

## 2022-09-06 MED ORDER — SODIUM CHLORIDE 0.9% FLUSH: 30.0000 mL | Freq: Three times a day (TID) | INTRAVENOUS | 3 refills | Status: DC

## 2022-09-06 MED ORDER — HEPARIN SOD (PORK) LOCK FLUSH 100 UNIT/ML IV SOLN
500.0000 [IU] | INTRAVENOUS | Status: AC | PRN
  Administered 2022-09-06: 500 [IU]

## 2022-09-06 NOTE — TOC Transition Note (Signed)
Transition of Care [facility]) - CM/SW Discharge Note   Patient Details  Name: [name] [name] MRN: [id number] Date of Birth: [birth year]  Transition of Care [facility]) CM/SW Contact:  Zenon Mayo, RN Phone Number: 09/06/2022, 11:23 AM   Clinical Narrative:    Patient is for dc today, his wife will be transporting him home today.  He is set up with [facility] with [facility], this NCM notified Amy with [facility].  Carolynn Sayers is aware also with [facility] she states she has everything she needs, they supply the tube feeding for patient and the pump.  The [place] PA will send the j tube flushes to TOC to be filled.  Wife has been educated on tube feeds and flushes.     Final next level of care: [place] Barriers to Discharge: No Barriers Identified   Patient Goals and CMS Choice Patient states their goals for this hospitalization and ongoing recovery are:: return home CMS Medicare.gov Compare Post Acute Care list provided to:: Patient Represenative (must comment) Choice offered to / list presented to : Spouse  Discharge Placement                       Discharge Plan and Services                DME Arranged: Tube feeding, Tube feeding pump ([facility]/ Carolynn Sayers)   Date DME Agency Contacted: 08/24/22 Time DME Agency Contacted: 1000 Representative spoke with at DME Agency: Carolynn Sayers [facility] Arranged: RN [place] Agency: [place] Date [place]: 08/25/22 Time [place]: 30 Representative spoke with at [place]: Amy  Social Determinants of Health ([place]) Interventions Housing Interventions: Patient Refused   Readmission Risk Interventions     No data to display

## 2022-09-06 NOTE — Progress Notes (Signed)
RN removed PIV and went over d/c summary w/ pt and pt's wife. Belongings w/ them. NT will transport pt to private vehicle where pt's wife will transport pt home.

## 2022-09-07 ENCOUNTER — Other Ambulatory Visit: Payer: Self-pay | Admitting: Physician Assistant

## 2022-09-07 ENCOUNTER — Other Ambulatory Visit: Payer: Self-pay

## 2022-09-09 ENCOUNTER — Other Ambulatory Visit: Payer: Self-pay | Admitting: Physician Assistant

## 2022-09-09 MED ORDER — AMOXICILLIN-POT CLAVULANATE 400-57 MG/5ML PO SUSR: 875.0000 mg | Freq: Two times a day (BID) | ORAL | 0 refills | Status: DC

## 2022-09-09 NOTE — Progress Notes (Signed)
Patient's wife called stating he did not receive enough antibiotics.  The patient does not have enough antibiotics to complete course.  She is also very upset about the hospital discharge.  She is stating that she did not have any supplies that she needed including feeding pole, pump, nor tube feedings.  She also states she has no syringes that she can use to give medications or flush her tubing with.  She states there are multiple miscommunications about care and discharge.  She is very upset and plans to contact hospital administration.   Antibiotics were called into [facility] at [facility].  The prescription was ordered for 748 ml which will cover the patient until 12/28.  She was instructed to contact our office with further questions, concerns.  Ellwood Handler, PA-C

## 2022-09-14 ENCOUNTER — Inpatient Hospital Stay (HOSPITAL_BASED_OUTPATIENT_CLINIC_OR_DEPARTMENT_OTHER): Payer: No Typology Code available for payment source | Admitting: Hematology

## 2022-09-14 ENCOUNTER — Inpatient Hospital Stay: Payer: No Typology Code available for payment source

## 2022-09-14 VITALS — BP 98/77 | HR 138 | Temp 100.7°F | Resp 19 | Ht 70.0 in | Wt 180.5 lb

## 2022-09-14 DIAGNOSIS — R911 Solitary pulmonary nodule: Secondary | ICD-10-CM | POA: Insufficient documentation

## 2022-09-14 DIAGNOSIS — C160 Malignant neoplasm of cardia: Secondary | ICD-10-CM | POA: Insufficient documentation

## 2022-09-14 DIAGNOSIS — Z79899 Other long term (current) drug therapy: Secondary | ICD-10-CM | POA: Insufficient documentation

## 2022-09-14 DIAGNOSIS — R42 Dizziness and giddiness: Secondary | ICD-10-CM | POA: Insufficient documentation

## 2022-09-14 DIAGNOSIS — Z803 Family history of malignant neoplasm of breast: Secondary | ICD-10-CM | POA: Insufficient documentation

## 2022-09-14 DIAGNOSIS — J948 Other specified pleural conditions: Secondary | ICD-10-CM | POA: Insufficient documentation

## 2022-09-14 DIAGNOSIS — E871 Hypo-osmolality and hyponatremia: Secondary | ICD-10-CM | POA: Insufficient documentation

## 2022-09-14 DIAGNOSIS — I517 Cardiomegaly: Secondary | ICD-10-CM | POA: Insufficient documentation

## 2022-09-14 DIAGNOSIS — E041 Nontoxic single thyroid nodule: Secondary | ICD-10-CM | POA: Insufficient documentation

## 2022-09-14 DIAGNOSIS — I700 Atherosclerosis of aorta: Secondary | ICD-10-CM | POA: Insufficient documentation

## 2022-09-14 DIAGNOSIS — Z923 Personal history of irradiation: Secondary | ICD-10-CM | POA: Insufficient documentation

## 2022-09-14 DIAGNOSIS — K760 Fatty (change of) liver, not elsewhere classified: Secondary | ICD-10-CM | POA: Insufficient documentation

## 2022-09-14 DIAGNOSIS — D649 Anemia, unspecified: Secondary | ICD-10-CM | POA: Insufficient documentation

## 2022-09-14 DIAGNOSIS — J90 Pleural effusion, not elsewhere classified: Secondary | ICD-10-CM | POA: Insufficient documentation

## 2022-09-14 DIAGNOSIS — I2510 Atherosclerotic heart disease of native coronary artery without angina pectoris: Secondary | ICD-10-CM | POA: Insufficient documentation

## 2022-09-14 DIAGNOSIS — E860 Dehydration: Secondary | ICD-10-CM

## 2022-09-14 LAB — CBC WITH DIFFERENTIAL/PLATELET
Abs Immature Granulocytes: 0.03 10*3/uL (ref 0.00–0.07)
Basophils Absolute: 0 10*3/uL (ref 0.0–0.1)
Basophils Relative: 0 %
Eosinophils Absolute: 0 10*3/uL (ref 0.0–0.5)
Eosinophils Relative: 0 %
HCT: 28.5 % — ABNORMAL LOW (ref 39.0–52.0)
Hemoglobin: 9.6 g/dL — ABNORMAL LOW (ref 13.0–17.0)
Immature Granulocytes: 0 %
Lymphocytes Relative: 3 %
Lymphs Abs: 0.2 10*3/uL — ABNORMAL LOW (ref 0.7–4.0)
MCH: 31.7 pg (ref 26.0–34.0)
MCHC: 33.7 g/dL (ref 30.0–36.0)
MCV: 94.1 fL (ref 80.0–100.0)
Monocytes Absolute: 1 10*3/uL (ref 0.1–1.0)
Monocytes Relative: 11 %
Neutro Abs: 7.9 10*3/uL — ABNORMAL HIGH (ref 1.7–7.7)
Neutrophils Relative %: 86 %
Platelets: 306 10*3/uL (ref 150–400)
RBC: 3.03 MIL/uL — ABNORMAL LOW (ref 4.22–5.81)
RDW: 13.5 % (ref 11.5–15.5)
WBC: 9.2 10*3/uL (ref 4.0–10.5)
nRBC: 0 % (ref 0.0–0.2)

## 2022-09-14 LAB — COMPREHENSIVE METABOLIC PANEL
ALT: 159 U/L — ABNORMAL HIGH (ref 0–44)
AST: 87 U/L — ABNORMAL HIGH (ref 15–41)
Albumin: 2.3 g/dL — ABNORMAL LOW (ref 3.5–5.0)
Alkaline Phosphatase: 194 U/L — ABNORMAL HIGH (ref 38–126)
Anion gap: 10 (ref 5–15)
BUN: 22 mg/dL (ref 8–23)
CO2: 24 mmol/L (ref 22–32)
Calcium: 8.7 mg/dL — ABNORMAL LOW (ref 8.9–10.3)
Chloride: 91 mmol/L — ABNORMAL LOW (ref 98–111)
Creatinine, Ser: 0.62 mg/dL (ref 0.61–1.24)
GFR, Estimated: >60 mL/min (ref >60)
Glucose, Bld: 150 mg/dL — ABNORMAL HIGH (ref 70–99)
Potassium: 4.4 mmol/L (ref 3.5–5.1)
Sodium: 125 mmol/L — ABNORMAL LOW (ref 135–145)
Total Bilirubin: 0.5 mg/dL (ref 0.3–1.2)
Total Protein: 7.8 g/dL (ref 6.5–8.1)

## 2022-09-14 LAB — IRON AND TIBC
Iron: 13 ug/dL — ABNORMAL LOW (ref 45–182)
Saturation Ratios: 5 % — ABNORMAL LOW (ref 17.9–39.5)
TIBC: 277 ug/dL (ref 250–450)
UIBC: 264 ug/dL

## 2022-09-14 LAB — VITAMIN B12: Vitamin B-12: 334 pg/mL (ref 180–914)

## 2022-09-14 LAB — LACTATE DEHYDROGENASE: LDH: 194 U/L — ABNORMAL HIGH (ref 98–192)

## 2022-09-14 LAB — FERRITIN: Ferritin: 382 ng/mL — ABNORMAL HIGH (ref 24–336)

## 2022-09-14 LAB — FOLATE: Folate: 7.9 ng/mL (ref >5.9)

## 2022-09-14 MED ORDER — POTASSIUM CHLORIDE IN NACL 20-0.9 MEQ/L-% IV SOLN
Freq: Once | INTRAVENOUS | Status: AC
  Filled 2022-09-14: qty 1000

## 2022-09-14 MED ORDER — HEPARIN SOD (PORK) LOCK FLUSH 100 UNIT/ML IV SOLN
500.0000 [IU] | Freq: Once | INTRAVENOUS | Status: AC
  Administered 2022-09-14: 500 [IU] via INTRAVENOUS

## 2022-09-14 MED ORDER — SODIUM CHLORIDE 0.9% FLUSH
10.0000 mL | INTRAVENOUS | Status: DC | PRN
  Administered 2022-09-14: 10 mL via INTRAVENOUS

## 2022-09-14 MED ORDER — MAGNESIUM SULFATE 2 GM/50ML IV SOLN
2.0000 g | Freq: Once | INTRAVENOUS | Status: AC
  Administered 2022-09-14: 2 g via INTRAVENOUS
  Filled 2022-09-14: qty 50

## 2022-09-14 NOTE — Patient Instructions (Signed)
[facility]  Discharge Instructions: Thank you for choosing [facility] to provide your oncology and hematology care.  If you have a lab appointment with the Cancer Center, please come in thru the Main Entrance and check in at the main information desk.  Wear comfortable clothing and clothing appropriate for easy access to any Portacath or PICC line.   We strive to give you quality time with your provider. You may need to reschedule your appointment if you arrive late (15 or more minutes).  Arriving late affects you and other patients whose appointments are after yours.  Also, if you miss three or more appointments without notifying the office, you may be dismissed from the clinic at the provider's discretion.      For prescription refill requests, have your pharmacy contact our office and allow 72 hours for refills to be completed.     To help prevent nausea and vomiting after your treatment, we encourage you to take your nausea medication as directed.  BELOW ARE SYMPTOMS THAT SHOULD BE REPORTED IMMEDIATELY: *FEVER GREATER THAN 100.4 F (38 C) OR HIGHER *CHILLS OR SWEATING *NAUSEA AND VOMITING THAT IS NOT CONTROLLED WITH YOUR NAUSEA MEDICATION *UNUSUAL SHORTNESS OF BREATH *UNUSUAL BRUISING OR BLEEDING *URINARY PROBLEMS (pain or burning when urinating, or frequent urination) *BOWEL PROBLEMS (unusual diarrhea, constipation, pain near the anus) TENDERNESS IN MOUTH AND THROAT WITH OR WITHOUT PRESENCE OF ULCERS (sore throat, sores in mouth, or a toothache) UNUSUAL RASH, SWELLING OR PAIN  UNUSUAL VAGINAL DISCHARGE OR ITCHING   Items with * indicate a potential emergency and should be followed up as soon as possible or go to the Emergency Department if any problems should occur.  Please show the CHEMOTHERAPY ALERT CARD or IMMUNOTHERAPY ALERT CARD at check-in to the Emergency Department and triage nurse.  Should you have questions after your visit or need to  cancel or reschedule your appointment, please contact [facility] [phone]  and follow the prompts.  Office hours are 8:00 a.m. to 4:30 p.m. Monday - Friday. Please note that voicemails left after 4:00 p.m. may not be returned until the following business day.  We are closed weekends and major holidays. You have access to a nurse at all times for urgent questions. Please call the main number to the clinic [phone] and follow the prompts.  For any non-urgent questions, you may also contact your provider using MyChart. We now offer e-Visits for anyone 18 and older to request care online for non-urgent symptoms. For details visit [email or web address].   Also download the MyChart app! Go to the app store, search "MyChart", open the app, select [facility], and log in with your MyChart username and password.  Masks are optional in the cancer centers. If you would like for your care team to wear a mask while they are taking care of you, please let them know. You may have one support person who is at least 63 years old accompany you for your appointments.  

## 2022-09-14 NOTE — Progress Notes (Signed)
Patient presents today for IVF.  Patient is in stable condition, but is feeling weak due to recent surgery.  BP is slightly low and HR is elevated.  All other vitals are stable.   Labs reviewed by Dr. Delton Coombes during his office visit.  We will proceed with IVF per MD orders.   Patient tolerated fluids well with no complaints voiced.  Patient left via wheelchair in stable condition.  Vital signs stable at discharge.  Follow up as scheduled.

## 2022-09-14 NOTE — Progress Notes (Signed)
Patient seen by MD today. Orders given for hydration fluids , one liter saline with 23mq of K+, magnesium '2mg'$ . Per Dr. KDebe Coderok to give this over one hour.

## 2022-09-14 NOTE — Patient Instructions (Addendum)
[facility] at [facility] Discharge Instructions   You were seen and examined today by Dr. Delton Coombes.  We will plan to draw lab work today. We will also give you IV fluids today.   He discussed starting on immunotherapy (Opdivo) after the drains are removed.   We will see you back in January after Dr. Kipp Brood removes the drains to plan to start treatment.      Thank you for choosing [place] at [facility] to provide your oncology and hematology care.  To afford each patient quality time with our provider, please arrive at least 15 minutes before your scheduled appointment time.   If you have a lab appointment with the [facility] please come in thru the Main Entrance and check in at the main information desk.  You need to re-schedule your appointment should you arrive 10 or more minutes late.  We strive to give you quality time with our providers, and arriving late affects you and other patients whose appointments are after yours.  Also, if you no show three or more times for appointments you may be dismissed from the clinic at the providers discretion.     Again, thank you for choosing [facility].  Our hope is that these requests will decrease the amount of time that you wait before being seen by our physicians.       _____________________________________________________________  Should you have questions after your visit to [facility], please contact our office at [phone] and follow the prompts.  Our office hours are 8:00 a.m. and 4:30 p.m. Monday - Friday.  Please note that voicemails left after 4:00 p.m. may not be returned until the following business day.  We are closed weekends and major holidays.  You do have access to a nurse 24-7, just call the main number to the clinic [phone] and do not press any options, hold on the line and a nurse will answer the phone.    For prescription refill  requests, have your pharmacy contact our office and allow 72 hours.    Due to Covid, you will need to wear a mask upon entering the hospital. If you do not have a mask, a mask will be given to you at the Main Entrance upon arrival. For doctor visits, patients may have 1 support person age 2 or older with them. For treatment visits, patients can not have anyone with them due to social distancing guidelines and our immunocompromised population.

## 2022-09-14 NOTE — Progress Notes (Signed)
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 Chemoradiation therapy from 05/12/2022 through 06/23/2022 2.  Ivor Lewis esophagectomy on 08/18/2022  NGS Results: not done  CURRENT THERAPY: Adjuvant nivolumab being planned  BRIEF ONCOLOGIC HISTORY:  Oncology History  GE junction carcinoma ([name] [name])  04/12/2022 Initial Diagnosis   GE junction carcinoma ([name] [name])   05/12/2022 -  Chemotherapy   Patient is on Treatment Plan : ESOPHAGUS Carboplatin/PACLitaxel weekly x 6 weeks with XRT         CANCER STAGING:  Cancer Staging  GE junction carcinoma ([name] [name]) Staging form: Esophagus - Adenocarcinoma, AJCC 8th Edition - Clinical stage from 04/12/2022: Stage III (cT3, cN0, cM0, G3) - Unsigned   INTERVAL HISTORY:  [name] [name], a 61 y.o. male, seen for follow-up of for GE junction adenocarcinoma.  He had underwent Ivor Lewis esophagectomy on 08/18/2022.  Unfortunately it was complicated by anastomotic leak and had to undergo right-sided VATS and esophageal stent placement on 08/29/2022.  He was recently released from hospital around 09/06/2022.  He has been maintained on tube feeds.  He is also receiving amoxicillin and fluconazole while tube feeds.  His wife reports temperature of 101 last night but mostly it is around 99-100.  [name] [name] is feeling very weak and spends most of the time in the bed or in the chair.  He is also somewhat depressed.  He reports energy levels as 40%.  REVIEW OF SYSTEMS:  Review of Systems  Respiratory:  Positive for shortness of breath.   Neurological:  Positive for dizziness and numbness (Left toes on and off).  Psychiatric/Behavioral:  Positive for depression. The patient is nervous/anxious.   All other systems reviewed  and are negative.   PAST MEDICAL/SURGICAL HISTORY:  Past Medical History:  Diagnosis Date   Cancer [facility])    Esophageal Cancer   History of radiation therapy    Esophagus- 05/12/22-06/22/22- Dr. Gery Pray   Port-A-Cath in place 05/11/2022   Past Surgical History:  Procedure Laterality Date   BIOPSY  03/25/2022   Procedure: BIOPSY;  Surgeon: Harvel Quale, MD;  Location: [facility];  Service: Gastroenterology;;  GE junction;   BIOPSY  04/25/2022   Procedure: BIOPSY;  Surgeon: Irving Copas., MD;  Location: [facility];  Service: Gastroenterology;;   COLONOSCOPY     COLONOSCOPY WITH PROPOFOL N/A 03/25/2022   Procedure: COLONOSCOPY WITH PROPOFOL;  Surgeon: Harvel Quale, MD;  Location: [facility];  Service: Gastroenterology;  Laterality: N/A;  945 ASA 1   ESOPHAGOGASTRODUODENOSCOPY N/A 08/18/2022   Procedure: ESOPHAGOGASTRODUODENOSCOPY (EGD);  Surgeon: Lajuana Matte, MD;  Location: [facility] OR;  Service: Thoracic;  Laterality: N/A;   ESOPHAGOGASTRODUODENOSCOPY N/A 08/29/2022   Procedure: ESOPHAGOGASTRODUODENOSCOPY (EGD);  Surgeon: Lajuana Matte, MD;  Location: [place];  Service: Thoracic;  Laterality: N/A;  will need C arm   ESOPHAGOGASTRODUODENOSCOPY (EGD) WITH PROPOFOL N/A 03/25/2022   Procedure: ESOPHAGOGASTRODUODENOSCOPY (EGD) WITH PROPOFOL;  Surgeon: Harvel Quale, MD;  Location: [facility];  Service: Gastroenterology;  Laterality: N/A;   ESOPHAGOGASTRODUODENOSCOPY (EGD) WITH PROPOFOL N/A 04/25/2022   Procedure: ESOPHAGOGASTRODUODENOSCOPY (EGD) WITH PROPOFOL;  Surgeon: Rush Landmark Telford Nab., MD;  Location: [facility];  Service: Gastroenterology;  Laterality: N/A;   INTERCOSTAL NERVE BLOCK Right  08/18/2022   Procedure: INTERCOSTAL NERVE BLOCK;  Surgeon: Lajuana Matte, MD;  Location: [place];  Service: Thoracic;  Laterality: Right;   IR IMAGING GUIDED PORT INSERTION  05/04/2022   LIPOMA EXCISION     years ago    UPPER ESOPHAGEAL ENDOSCOPIC ULTRASOUND (EUS) N/A 04/25/2022   Procedure: UPPER ESOPHAGEAL ENDOSCOPIC ULTRASOUND (EUS);  Surgeon: Irving Copas., MD;  Location: [facility];  Service: Gastroenterology;  Laterality: N/A;   VIDEO ASSISTED THORACOSCOPY Right 08/29/2022   Procedure: VIDEO ASSISTED THORACOSCOPY;  Surgeon: Lajuana Matte, MD;  Location: [place];  Service: Thoracic;  Laterality: Right;    SOCIAL HISTORY:  Social History   Socioeconomic History   Marital status: Married    Spouse name: Not on file   Number of children: Not on file   Years of education: Not on file   Highest education level: Not on file  Occupational History   Not on file  Tobacco Use   Smoking status: Never    Passive exposure: Past   Smokeless tobacco: Never  Vaping Use   Vaping Use: Never used  Substance and Sexual Activity   Alcohol use: Yes    Comment: 2-3 per week   Drug use: Never   Sexual activity: Yes  Other Topics Concern   Not on file  Social History Narrative   Not on file   Social Determinants of Health   Financial Resource Strain: Not on file  Food Insecurity: No Food Insecurity (08/21/2022)   Hunger Vital Sign    Worried About Running Out of Food in the Last Year: Never true    Ran Out of Food in the Last Year: Never true  Transportation Needs: No Transportation Needs (08/21/2022)   PRAPARE - Hydrologist (Medical): No    Lack of Transportation (Non-Medical): No  Physical Activity: Not on file  Stress: Not on file  Social Connections: Not on file  Intimate Partner Violence: Not At Risk (08/21/2022)   Humiliation, Afraid, Rape, and Kick questionnaire    Fear of Current or Ex-Partner: No    Emotionally Abused: No    Physically Abused: No    Sexually Abused: No    FAMILY HISTORY:  Family History  Problem Relation Age of Onset   Cancer Maternal Grandmother        Breast cancer   Cancer Other        Maternal Cousin - breast cancer     CURRENT MEDICATIONS:  Current Outpatient Medications  Medication Sig Dispense Refill   acetaminophen (TYLENOL) 160 MG/5ML solution Place 20.3 mLs (650 mg total) into feeding tube every 8 (eight) hours as needed for mild pain. 120 mL 0   amoxicillin-clavulanate (AUGMENTIN) 400-57 MG/5ML suspension Place 10.9 mLs (875 mg total) into feeding tube every 12 (twelve) hours. 748 mL 0   fluconazole (DIFLUCAN) 40 MG/ML suspension Place 10 mLs (400 mg total) into feeding tube daily. 380 mL 0   HYDROcodone-acetaminophen (HYCET) 7.5-325 mg/15 ml solution Place 10 mLs into feeding tube every 6 (six) hours as needed for severe pain. 380 mL 0   Nutritional Supplements (FEEDING SUPPLEMENT, OSMOLITE 1.5 CAL,) LIQD Place 90 mL/hr into feeding tube daily. For 18 hours each day. 1000 mL 30   Protein (FEEDING SUPPLEMENT, PROSOURCE TF20,) liquid Place 60 mLs into feeding tube daily. 60 mL 30   sodium chloride flush (NS) 0.9 % SOLN Place 30 mLs into feeding tube every 8 (eight) hours. 300 mL 3  No current facility-administered medications for this visit.   Facility-Administered Medications Ordered in Other Visits  Medication Dose Route Frequency Provider Last Rate Last Admin   sodium chloride flush (NS) 0.9 % injection 10 mL  10 mL Intravenous PRN Derek Jack, MD   10 mL at 09/14/22 1634    ALLERGIES:  No Known Allergies  PHYSICAL EXAM:  Performance status (ECOG): 0 - Asymptomatic  Vitals:   09/14/22 1439  BP: 98/77  Pulse: (!) 138  Resp: 19  Temp: (!) 100.7 F (38.2 C)  SpO2: 95%   Wt Readings from Last 3 Encounters:  09/14/22 180 lb 8 oz (81.9 kg)  09/01/22 197 lb 12 oz (89.7 kg)  08/15/22 197 lb 12.8 oz (89.7 kg)   Physical Exam Vitals reviewed.  Constitutional:      Appearance: Normal appearance.  Cardiovascular:     Rate and Rhythm: Normal rate and regular rhythm.     Pulses: Normal pulses.     Heart sounds: Normal heart sounds.  Pulmonary:     Effort: Pulmonary effort is  normal.     Breath sounds: Normal breath sounds.     Comments: Decreased breath sounds on the right base. Neurological:     General: No focal deficit present.     Mental Status: He is alert and oriented to person, place, and time.  Psychiatric:        Mood and Affect: Mood normal.        Behavior: Behavior normal.     LABORATORY DATA:  I have reviewed the labs as listed.     Latest Ref Rng & Units 09/14/2022    3:05 PM 09/04/2022   12:14 AM 08/31/2022   12:25 AM  CBC  WBC 4.0 - 10.5 K/uL 9.2  14.0  9.1   Hemoglobin 13.0 - 17.0 g/dL 9.6  8.4  8.0   Hematocrit 39.0 - 52.0 % 28.5  25.5  23.5   Platelets 150 - 400 K/uL 306  278  189       Latest Ref Rng & Units 09/14/2022    3:05 PM 09/05/2022    5:10 AM 09/03/2022   12:00 AM  CMP  Glucose 70 - 99 mg/dL 150  122  131   BUN 8 - 23 mg/dL '22  14  16   '$ Creatinine 0.61 - 1.24 mg/dL 0.62  0.82  0.79   Sodium 135 - 145 mmol/L 125  130  131   Potassium 3.5 - 5.1 mmol/L 4.4  4.5  4.1   Chloride 98 - 111 mmol/L 91  97  98   CO2 22 - 32 mmol/L '24  24  25   '$ Calcium 8.9 - 10.3 mg/dL 8.7  8.6  8.2   Total Protein 6.5 - 8.1 g/dL 7.8  5.9    Total Bilirubin 0.3 - 1.2 mg/dL 0.5  0.5    Alkaline Phos 38 - 126 U/L 194  110    AST 15 - 41 U/L 87  36    ALT 0 - 44 U/L 159  46      DIAGNOSTIC IMAGING:  I have independently reviewed the scans and discussed with the patient. [place] EKG SITE RITE  Result Date: 09/05/2022 If Site Rite image not attached, placement could not be confirmed due to current cardiac rhythm.  DG CHEST PORT 1 VIEW  Result Date: 09/04/2022 CLINICAL DATA:  [id number] with pleural effusion, right chest tubes esophageal stenting. EXAM: PORTABLE CHEST 1 VIEW COMPARISON:  Portable chest  09/01/2022 FINDINGS: 5:49 a.m. right-sided chest tubes are unchanged in positioning. Smooth thickening of the right pleural margins appears to be due to pleural fluid. There is no visible pneumothorax today. There are streaky opacities in the  hypoexpanded right lower lung field which could be atelectasis or infiltrates. Small layering left pleural effusion appears similar with left lung otherwise clear. Stable mediastinal configuration with esophageal stent. The cardiac size is normal. Right IJ port catheter again terminates at about the superior cavoatrial junction. IMPRESSION: 1. No visible pneumothorax today. 2. Right pleural fluid appears similar to the most recent prior study. 3. Streaky opacities in the hypoexpanded right lower lung field could be atelectasis or infiltrates. 4. Unchanged small left pleural effusion. Electronically Signed   By: Telford Nab M.D.   On: 09/04/2022 07:46   DG ESOPHAGUS W SINGLE CM (SOL OR THIN BA)  Result Date: 09/01/2022 CLINICAL DATA:  2 weeks status post Ivor Lewis esophagectomy complicated by postoperative anastomotic leak treated with covered stent placement 3 days ago. EXAM: ESOPHAGRAM TECHNIQUE: Single contrast examination was performed using water soluble contrast. This exam was performed by Pasty Spillers, PA-C, and was supervised and interpreted by Logan Bores, MD. FLUOROSCOPY: Radiation Exposure Index (as provided by the fluoroscopic device): 24.70 mGy Kerma COMPARISON:  Chest CT 08/28/2022. Esophagram 08/22/2022. Chest radiograph 08/29/2022. FINDINGS: An esophageal stent appears unchanged in position compared to the 08/29/2022 chest radiograph. Contrast was swallowed without difficulty and easily traverses the esophageal stent and accumulates in the stomach. Contrast was subsequently seen to surround the distal aspect of the stent and migrate proximally along the left lateral aspect of the stent, presumably between the outer wall of the stent and the gastric pull-through. This extends cephalad to the level of the carina where there is subsequent progressive accumulation of contrast within a cavity lateral to the esophagus on the right, either in the mediastinum or pleural space in the region of the  anastomotic defect shown on the prior CT. IMPRESSION: Persistent esophageal leak, with contrast entering the stomach and then appearing to migrate proximally along the outer stent wall and through the previously identified anastomotic defect into the right hemithorax. No apparent stent migration. Findings communicated to Dr. Kipp Brood on 09/01/2022 at 1318 hours. Electronically Signed   By: Logan Bores M.D.   On: 09/01/2022 14:07   DG CHEST PORT 1 VIEW  Result Date: 09/01/2022 CLINICAL DATA:  [id number] Pleural effusion [id number] EXAM: PORTABLE CHEST 1 VIEW COMPARISON:  August 29, 2022 August 30, 2022 FINDINGS: The cardiomediastinal silhouette is unchanged in contour.Similar positioning of a esophageal stent. RIGHT-sided chest tubes. RIGHT-sided chest port with tip terminating over the superior cavoatrial junction. There is a RIGHT-sided favored hydropneumothorax with a decreased fluid component since November fourteenth, 2023. There is a trace air component. Continued improvement in aeration of the RIGHT lung with scattered linear opacities most consistent with atelectasis. LEFT basilar heterogeneous opacities, likely atelectasis with differential considerations including aspiration. IMPRESSION: 1. RIGHT-sided hydropneumothorax with a decreased fluid component since November fourteenth, 2023. 2. Continued improvement in aeration of the RIGHT lung. Electronically Signed   By: Valentino Saxon M.D.   On: 09/01/2022 07:52   DG Chest Port 1 View  Result Date: 08/30/2022 CLINICAL DATA:  Anastomotic leak following esophagectomy. Chest pain EXAM: PORTABLE CHEST 1 VIEW COMPARISON:  AP chest 08/29/2022, 08/28/2022, 08/26/2022; CT chest abdomen and pelvis 08/28/2022 FINDINGS: Redemonstration of esophageal stent, appearing unchanged in position. Right chest wall porta catheter with tip overlying the central superior vena cava,  unchanged. Right-sided chest tube with tip overlying the superior right hemithorax.  Additional more lateral right-sided chest tube again while the sharp turn overlying the severe right hemithorax with the distal aspect extending inferiorly and the tip overlying the lateral right lower lung. There are again loculated pleural effusions along the lateral and inferior aspect of the right hemithorax. Mild improved aeration of the right lung. The left lung is again relatively clear. No acute skeletal abnormality. IMPRESSION: 1. Mildly improved aeration of the right lung with stable to slightly improved loculated pleural effusions along the lateral and inferior aspect of the right hemithorax. 2. Redemonstration of esophageal stent. Unchanged position of right-sided chest tubes. Electronically Signed   By: Yvonne Kendall M.D.   On: 08/30/2022 08:37   DG Chest Port 1 View  Result Date: 08/29/2022 CLINICAL DATA:  Anastomotic leak following esophagectomy EXAM: PORTABLE CHEST 1 VIEW COMPARISON:  08/28/2022, CT 08/28/2022, radiograph 08/15/2022, 08/18/2022 FINDINGS: Right-sided central venous port tip at the cavoatrial region. Interim placement of a mediastinal/esophageal stent. Right-sided chest tube with tip at apex similar compared to prior. Suspect new right-sided chest tube which appears folded back upon itself in the apical region of the right lung with tip in the right lateral lower lung. Loculated pleural effusion/collections may be slightly increased. Poor aeration of right thorax. Persistent partial consolidation left lung base. IMPRESSION: 1. Slight increased loculated pleural collections/fluid on the right. Suspect additional right chest tube with tubing folded back upon itself at right apex, tip appears to be within the right lower lateral chest. 2. Interval placement of esophageal stent 3. Slightly improved aeration of left lung base but with residual partial consolidation. Electronically Signed   By: Donavan Foil M.D.   On: 08/29/2022 18:44   DG C-Arm 1-60 Min-No Report  Result Date:  08/29/2022 Fluoroscopy was utilized by the requesting physician.  No radiographic interpretation.   CT CHEST ABDOMEN PELVIS W CONTRAST  Result Date: 08/28/2022 CLINICAL DATA:  Status post esophagectomy. Evaluate for anastomotic leak/abscess in chest or abdomen. * Tracking Code: BO * EXAM: CT CHEST, ABDOMEN, AND PELVIS WITH CONTRAST TECHNIQUE: Multidetector CT imaging of the chest, abdomen and pelvis was performed following the standard protocol during bolus administration of intravenous contrast. RADIATION DOSE REDUCTION: This exam was performed according to the departmental dose-optimization program which includes automated exposure control, adjustment of the mA and/or kV according to patient size and/or use of iterative reconstruction technique. CONTRAST:  52m OMNIPAQUE IOHEXOL 350 MG/ML SOLN COMPARISON:  Chest radiographs, most recent earlier today. Esophagram 08/22/2022. PET 07/21/2022. Preoperative CTs of 03/30/2022. FINDINGS: CT CHEST FINDINGS Cardiovascular: Right Port-A-Cath tip mid right atrium. Aortic atherosclerosis. Normal heart size, without pericardial effusion. Lad coronary artery calcification. No central pulmonary embolism, on this non-dedicated study. Mediastinum/Nodes: Calcified right-sided thyroid nodule has been characterized previously including on prior PET. Right paratracheal 1.0 cm node on 09/03 is newly enlarged, favored to be reactive. No hilar adenopathy. Small volume pneumomediastinum. Surgical changes of partial esophagectomy and gastric pull-through. At the level of the gastro/esophageal anastomosis, there is a right-sided defect which communicates with the right pleural space. Example 21/3. There is gas adjacent to more distal surgical sutures, at the site of gastric pull-through at the diaphragmatic hiatus, but no well-defined communication (43/3). Lungs/Pleura: Moderate, multifocal loculated right-sided hydropneumothorax. The largest gas and air pocket is identified  anteriorly including at 10.1 x 7.1 cm on 61/5. Small simple appearing left-sided pleural effusion. A right chest tube terminates within the superior right pleural space posteriorly.  Relatively diffuse, slightly lower lung predominant right-sided airspace disease is primarily felt to represent compressive atelectasis secondary to loculated pleural fluid. Dependent left lower lobe atelectasis. Musculoskeletal: No acute osseous abnormality. CT ABDOMEN PELVIS FINDINGS Hepatobiliary: Normal liver. Normal gallbladder, without biliary ductal dilatation. Pancreas: Normal, without mass or ductal dilatation. Spleen: Normal in size, without focal abnormality. Adrenals/Urinary Tract: Normal adrenal glands. Normal kidneys, without hydronephrosis. Normal urinary bladder. Stomach/Bowel: Normal distal stomach. Left-sided colonic underdistention. Normal terminal ileum and appendix. Percutaneous jejunostomy tube. Otherwise normal small bowel. Vascular/Lymphatic: Aortic atherosclerosis. No abdominopelvic adenopathy. Reproductive: Normal prostate. Other: Right perihepatic small volume fluid and gas anteriorly, including on 50/3. No free air or fluid more caudally. Musculoskeletal: No acute osseous abnormality. IMPRESSION: 1. Status post partial esophagectomy and gastric pull-through. Defect at the site of the esophageal-gastric anastomosis, at the craniocaudal level of the carina. This communicates with the right pleural space. 2. Secondary moderate volume, multifocal right-sided loculated hydropneumothorax. Right-sided airspace disease, primarily felt to represent compressive atelectasis. 3. Perihepatic gas and fluid, favored to be extension from the right hemithorax. No more caudal extraluminal gas or fluid identified. 4. Small volume pneumomediastinum. 5. New high mediastinal adenopathy, favored to be reactive. Recommend attention on follow-up. 6. Small left pleural effusion and compressive atelectasis. 7. Coronary artery  atherosclerosis. Aortic Atherosclerosis (ICD10-I70.0). These results will be called to the ordering clinician or representative by the Radiologist Assistant, and communication documented in the PACS or [employer]. Electronically Signed   By: Abigail Miyamoto M.D.   On: 08/28/2022 14:51   DG CHEST PORT 1 VIEW  Result Date: 08/28/2022 CLINICAL DATA:  History of esophagectomy EXAM: PORTABLE CHEST 1 VIEW COMPARISON:  08/26/2022 FINDINGS: Right Port-A-Cath tip at low SVC. Right-sided chest tube is unchanged in position. Mild cardiomegaly. No pneumothorax. Significantly worsened right-sided aeration, likely due to a combination of pleural fluid and airspace disease. Mild increase in left base opacity. No pneumothorax. IMPRESSION: Significantly worsened right-sided aeration, likely due to a combination of pleural fluid and airspace disease. Right-sided chest tube in place, without pneumothorax. Progressive left base airspace disease, likely atelectasis. Electronically Signed   By: Abigail Miyamoto M.D.   On: 08/28/2022 09:09   DG Chest Port 1 View  Result Date: 08/26/2022 CLINICAL DATA:  Status post esophagectomy EXAM: PORTABLE CHEST 1 VIEW COMPARISON:  08/23/2022 FINDINGS: Cardiomegaly. Right chest port catheter. Right-sided chest tube. Heterogeneous bibasilar airspace opacity increased compared to prior examination with probable small layering pleural effusions. No significant pneumothorax. IMPRESSION: 1. Right-sided chest tube. No significant pneumothorax. 2. Heterogeneous bibasilar airspace opacity increased compared to prior examination with probable small layering pleural effusions. 3. Cardiomegaly. Electronically Signed   By: Delanna Ahmadi M.D.   On: 08/26/2022 11:31   DG CHEST PORT 1 VIEW  Result Date: 08/23/2022 CLINICAL DATA:  Status post esophagectomy 08/18/2022 and chest tube removal EXAM: PORTABLE CHEST 1 VIEW COMPARISON:  Chest radiograph dated 08/21/2022 FINDINGS: Lines/tubes: Interval removal of  1 right-sided chest tube. An additional right-sided chest tube remains. Right chest wall port tip projects over the right atrium. Chest: Low lung volumes with persistent bibasilar atelectasis. Pleura: No definite pneumothorax. Trace right pleural effusion. Persistent pneumoperitoneum. Heart/mediastinum: Enlarged cardiomediastinal silhouette is likely projectional. Decreased pneumopericardium. Bones: No acute osseous abnormality. IMPRESSION: 1. Interval removal of 1 right-sided chest tube. An additional right-sided chest tube remains. 2. No definite pneumothorax.  Trace right pleural effusion. 3. Persistent pneumoperitoneum. 4. Persistent bibasilar atelectasis. Electronically Signed   By: Darrin Nipper M.D.   On: 08/23/2022 10:03  DG ESOPHAGUS W SINGLE CM (SOL OR THIN BA)  Result Date: 08/22/2022 CLINICAL DATA:  Patient with history of distal esophageal cancer s/p Ivor Lewis esophagectomy 08/18/22, request for esophagram to evaluate for post operative leak prior to advancing oral intake. EXAM: ESOPHAGUS/BARIUM SWALLOW/TABLET STUDY TECHNIQUE: Single contrast examination was performed using Omnipaque 300. This exam was performed by Candiss Norse, PA-C, and was supervised and interpreted by Nelson Chimes, MD. FLUOROSCOPY: Radiation Exposure Index (as provided by the fluoroscopic device): 1 minute 42 seconds 798.09 micro gray meter squared. COMPARISON:  None Available. FINDINGS: Swallowing: Appears normal. No vestibular penetration or aspiration seen. Pharynx: Unremarkable. Esophagus: Diffusely dilated esophagus. Post surgical changes of the distal esophagus and stomach. No leak noted in supine LPO, RPO and AP projections. Esophageal motility: Within normal limits for post operative state. Hiatal Hernia: None. Gastroesophageal reflux: None visualized. Ingested 13 mm barium tablet: Not given Other: None. IMPRESSION: Water soluble contrast esophagram without evidence of post operative leak. Electronically Signed   By:  Nelson Chimes M.D.   On: 08/22/2022 10:11   DG Chest Port 1 View  Result Date: 08/21/2022 CLINICAL DATA:  Status post esophagectomy EXAM: PORTABLE CHEST 1 VIEW COMPARISON:  08/19/2022 FINDINGS: Gastric catheter is again noted in satisfactory position. Right sided chest wall port is seen. Two chest tubes are noted on the right stable in appearance from the prior exam. Decreasing free air in the upper abdomen is noted. Basilar atelectasis is noted on the left. No pneumothorax is seen. Stable pneumopericardium is noted as well. IMPRESSION: No evidence of pneumothorax. Decreasing free air in the abdomen. Mild left basilar atelectasis. Electronically Signed   By: Inez Catalina M.D.   On: 08/21/2022 09:16   DG Chest 1 View  Result Date: 08/19/2022 CLINICAL DATA:  Status post esophagectomy EXAM: CHEST  1 VIEW COMPARISON:  Chest radiograph from one day prior. FINDINGS: Enteric tube terminates in the distal body of the stomach. Right internal jugular central venous catheter terminates over the cavoatrial junction. Two right apical chest tubes are in place. Stable cardiomediastinal silhouette with top-normal heart size and with scattered mild left pericardiac pneumomediastinum. No pneumothorax. No pleural effusion. Low lung volumes with increased patchy bibasilar lung opacities, left greater than right. No pulmonary edema. Stable free air under the right hemidiaphragm. Stable mild subcutaneous emphysema in the lateral lower right chest wall. IMPRESSION: 1. No pneumothorax. Right apical chest tubes in place. 2. Low lung volumes with increased patchy bibasilar lung opacities, left greater than right, favor atelectasis. 3. Stable free air under the right hemidiaphragm, presumably due to recent surgery. 4. Stable mild left pericardiac pneumomediastinum. Electronically Signed   By: Ilona Sorrel M.D.   On: 08/19/2022 08:47   DG Chest Port 1 View  Result Date: 08/18/2022 CLINICAL DATA:  Prior esophagectomy EXAM: PORTABLE  CHEST 1 VIEW COMPARISON:  Chest x-ray dated August 15, 2022 FINDINGS: Right chest wall port unchanged in position. Enteric tube partially seen coursing below the diaphragm. Two right-sided chest tubes. Cardiac and mediastinal contours within normal limits for AP technique. Pneumomediastinum pneumoperitoneum. Left-greater-than-right basilar opacities, likely due to atelectasis. No large pleural effusion evidence of pneumothorax. IMPRESSION: 1. Two right-sided chest tubes in place. No evidence of pneumothorax. 2. Pneumomediastinum and pneumoperitoneum, likely postsurgical. Electronically Signed   By: Yetta Glassman M.D.   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  He lost about 60 pounds since September, was trying to lose weight by walking 7 miles a day. - EGD (03/25/2022): Large ulcerating mass with no bleeding found at the GE junction, 40-42 cm from incisors.  Mass appeared to extend 1 cm distal towards the cardia.  Partially obstructing and circumferential.  Normal stomach and duodenum. 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 - CT CAP (03/30/2022): Thickened folds of the GE junction without discrete focal mass.  No upstream dilatation.  No intrathoracic, axillary or abdominal adenopathy.  5 mm right upper lobe solitary nodule.  2.4 cm densely calcified right thyroid nodule.  Mild hepatic steatosis. - PET CT scan (04/07/2022): Hypermetabolic area at the GE junction SUV 6.93.  No signs of adenopathy or suspicious lung nodules.  No sign of other metastatic disease.  Left thyroid lesion with moderately increased metabolic activity, SUV 1.61 - EGD/EUS (04/25/2022): Malignant esophageal tumor at distal esophagus extending into GE junction and into the cardia.  EUS staging T3N0.  No malignant appearing lymph nodes  visualized.  Mass extends from 37 cm to 42 cm (esophageal portion is 37-40 cm).  Lesion was circumferential.  Sonographic evidence suggesting invasion into 1 through the muscularis propria.  Intact interface seen between the mass and adjacent structures. - Concurrent chemoradiation therapy from 05/12/2022 through 06/23/2022. - Ivor Lewis esophagectomy on 08/18/2022.  R VATS and covered esophageal stent placement on 08/29/2022 for anastomotic leak. - Pathology: Poorly differentiated adenocarcinoma with signet ring cells, 3.5 x 2 cm, margins negative, ypT2 pN0, 0/8 lymph nodes positive.    Social/family history: - Lives at home with his wife.  He is a retired Electronics engineer at [employer].  Non-smoker.  Drinks alcohol 5 days/week, moderate. - Maternal grandmother had breast cancer.  Maternal cousin had metastatic breast cancer.  2.  Incidental PET positive left thyroid nodule: - Biopsy of the right thyroid nodule on 0/06/6044, scant follicular epithelium, Bethesda category 1. - He will need work-up for left thyroid nodule after completion of treatments.   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 - I have reviewed hospitalization records. - Based on Checkmate 577 study, adjuvant nivolumab is indicated for a total of 1 year.  This has improved median DFS by 11.5 months.  (22.4 months versus 11 months with placebo). - However he is too weak to receive treatment at this time. - He has 2 drains from the right chest wall, draining less than 10 mL per 24 hours.  He also has a esophageal stent in place due to anastomotic leak. - I have reviewed his labs today which showed hyponatremia with sodium 125.  Creatinine is 0.6.  Albumin is low at 2.3.  AST and ALT are minimally elevated.  Hemoglobin improved to 9.6.  Ferritin is 409, W11 and folic acid are normal. - Normocytic anemia from  chronic inflammation. - He is hypotensive with blood pressure 98/77 and heart rate of 138.  Temperature is 100.7.  I have recommended 1 L of fluid over 60 to 90 minutes. - We will see him back around 4 weeks from now to consider adjuvant nivolumab if his functional status improves. - I have also recommended physical therapy evaluation and treatment.  2.  Nutrition/feeding tube: - He is receiving Osmolite 7 cans/day. - He has reportedly gained 5 pounds since discharge from the hospital. - We will recommend  dietary consultation.  3.  Dizziness: - Blood pressure today is 98/77.  Continue hydration through feeding tube.    Orders placed this encounter:  Orders Placed This Encounter  Procedures   CBC with Differential   Comprehensive metabolic panel   Lactate dehydrogenase   Vitamin B12   Folate   Ferritin   Iron and TIBC ([facility] ONLY)   Methylmalonic acid, serum   Copper, serum      Derek Jack, MD [place] [phone]

## 2022-09-15 ENCOUNTER — Telehealth: Payer: Self-pay

## 2022-09-15 ENCOUNTER — Telehealth: Payer: Self-pay | Admitting: Dietician

## 2022-09-15 ENCOUNTER — Ambulatory Visit: Payer: No Typology Code available for payment source | Admitting: Hematology

## 2022-09-15 ENCOUNTER — Inpatient Hospital Stay: Payer: No Typology Code available for payment source | Admitting: Dietician

## 2022-09-15 ENCOUNTER — Encounter: Payer: Self-pay | Admitting: *Deleted

## 2022-09-15 NOTE — Telephone Encounter (Signed)
Patient's wife, [name] contacted the office concerned about patient's recent temperatures. She states that he has had a low-grade temperature since he was discharged from the hospital but just today he has started to spike higher temperatures. His last temperature was 102.5 and he was given Tylenol to bring it down.  He is s/p esophagectomy with Dr. Kipp Brood 11/2 and was discharged 11/21. Patient is scheduled to come into the office tomorrow for a follow-up appointment. Dr. Kipp Brood made aware of symptoms.

## 2022-09-15 NOTE — Progress Notes (Addendum)
Patient is a current patient with [facility] and has been since discharge from hospital on 09/06/22.  Spoke to [facility] nurse who was at the home today when I touched base with wife and arranged for PT to assess and treat.  [name] [name] is out of [place] and will also be available in the event Palliative Care or Hospice Services are needed.  Telephone number [phone].

## 2022-09-15 NOTE — Progress Notes (Signed)
Nutrition Follow-up:  Patient with cancer of GE junction. He has completed neoadjuvant chemoradiation therapy (final radiation 9/6). Patient underwent Ivor Lewis esophagectomy on 11/2 under the care of Dr. Kipp Brood. Adjuvant nivolumab being planned.    11/2-11/21 - esophagectomy on 26/9 complicated by anastomotic leak s/p right-sided VATS and esophageal stent placement 11/13  Spoke with wife of patient [name]) via telephone. She shares her frustration regarding lack of discharge planning. Patient was discharged from hospital without formula, tube feeding supplies/pump. States that patient went a day without feeding do to this. There was miscommunication about continued need for protein modular at home. She has ordered this off of [place]. They were not provided discharge instructions and states covering providers were rude. [name] reports they have no received any phone calls since returning home to check on patient, other than a robo call. [name] reported patient was not doing well at this call and was informed someone would be contacting them. They have not been contacted. She is planning to discuss this with Dr. Kipp Brood at follow-up as well as hospital administration.   [name] reports patient is depressed. He has not been talking much to anyone. Pt has been tolerating tube feedings at 90 ml/hr x 18 hrs. She advanced to 95 ml/hr yesterday and asking when to increase to 100 ml/hr. This would decrease pump time to 16 hours/day. She is hoping this time being connected to pump will help his mood. [name] was unable to order Prosource TF20 (69m, 80 kcal, 20g) as provided during admission. She was sent Prosource TF  (45 ml, 40 kcal, 11 g). She is giving patient ~1L of water via tube daily instead of 1.2 L. Patient continues to run low fevers, reports 100.5 this morning.   Medications: augmentin, diflucan, hycet  Labs: 11/29 - Na 125, glucose 150, albumin 2.3 (pt received IVF 11/29)  Anthropometrics: Wt 180  lb 8 oz on 11/29  10/30 - 197 lb 12.8 oz 9/19 - 182 lb 12.8 oz  8/31 - 193 lb 8 oz   Estimated Energy Needs  Kcals: [address]854[address]270Protein: 120-139 Fluid: >/= 2 L  NUTRITION DIAGNOSIS: Food and nutrition knowledge related deficit improving   MALNUTRITION DIAGNOSIS: Moderate malnutrition noted per 11/3 inpt RD [facility]   INTERVENTION:  Active listening, support and encouragement provided. KLevada Dyreceptive to having LCSW (Trilby Leaver contact pt. Reports that pt does better in the morning hours. This information relayed to LCSW by RD. Plans to contact pt tomorrow prior to f/u with surgery - wife appreciative Instructed wife to provide 45 ml Prosource TF twice daily with 30 ml water flush before and after Discussed hyponatremia with MD - will have pt give 30 ml chicken broth twice daily Tomorrow (12/1) Wife to increase tube feedings to 100 ml/hr x 16 hrs Educated on increasing water flushes to meet needs, recommend 240 ml water flush 5x/day Continue 7 cartons Osmolite 1.5 via pump. Provide 45 ml Prosoure TF twice daily. Flush tube with 240 ml water 5 times daily. Regimen provides 2565 kcal, 126 g protein, 1267 ml free water (2467 ml total water with flushes) Instructions and contact information provided via email per wife request Will provide additional feeding syringes as able  Continue strict NPO - diet advancement per Dr. LKipp Brood   MONITORING, EVALUATION, GOAL: weight trends, tube feedings   NEXT VISIT: Will schedule f/u with lab appointment

## 2022-09-16 ENCOUNTER — Encounter (HOSPITAL_COMMUNITY): Payer: Self-pay

## 2022-09-16 ENCOUNTER — Inpatient Hospital Stay (HOSPITAL_COMMUNITY): Payer: No Typology Code available for payment source

## 2022-09-16 ENCOUNTER — Inpatient Hospital Stay (HOSPITAL_COMMUNITY)
Admission: AD | Admit: 2022-09-16 | Discharge: 2022-12-16 | DRG: 393 | Disposition: E | Payer: No Typology Code available for payment source | Source: Ambulatory Visit | Attending: Thoracic Surgery (Cardiothoracic Vascular Surgery) | Admitting: Thoracic Surgery (Cardiothoracic Vascular Surgery)

## 2022-09-16 ENCOUNTER — Ambulatory Visit (INDEPENDENT_AMBULATORY_CARE_PROVIDER_SITE_OTHER): Payer: Self-pay | Admitting: Thoracic Surgery (Cardiothoracic Vascular Surgery)

## 2022-09-16 ENCOUNTER — Encounter: Payer: Self-pay | Admitting: Thoracic Surgery (Cardiothoracic Vascular Surgery)

## 2022-09-16 VITALS — BP 96/65 | HR 112 | Temp 98.1°F | Resp 20 | Ht 70.0 in | Wt 180.0 lb

## 2022-09-16 DIAGNOSIS — J9851 Mediastinitis: Secondary | ICD-10-CM

## 2022-09-16 DIAGNOSIS — N170 Acute kidney failure with tubular necrosis: Secondary | ICD-10-CM | POA: Diagnosis not present

## 2022-09-16 DIAGNOSIS — K316 Fistula of stomach and duodenum: Secondary | ICD-10-CM | POA: Diagnosis not present

## 2022-09-16 DIAGNOSIS — A419 Sepsis, unspecified organism: Secondary | ICD-10-CM

## 2022-09-16 DIAGNOSIS — R509 Fever, unspecified: Secondary | ICD-10-CM | POA: Diagnosis present

## 2022-09-16 DIAGNOSIS — E611 Iron deficiency: Secondary | ICD-10-CM | POA: Diagnosis not present

## 2022-09-16 DIAGNOSIS — J860 Pyothorax with fistula: Secondary | ICD-10-CM | POA: Diagnosis not present

## 2022-09-16 DIAGNOSIS — K567 Ileus, unspecified: Secondary | ICD-10-CM | POA: Diagnosis not present

## 2022-09-16 DIAGNOSIS — C155 Malignant neoplasm of lower third of esophagus: Secondary | ICD-10-CM

## 2022-09-16 DIAGNOSIS — T82898A Other specified complication of vascular prosthetic devices, implants and grafts, initial encounter: Secondary | ICD-10-CM | POA: Diagnosis not present

## 2022-09-16 DIAGNOSIS — Z79899 Other long term (current) drug therapy: Secondary | ICD-10-CM

## 2022-09-16 DIAGNOSIS — Z803 Family history of malignant neoplasm of breast: Secondary | ICD-10-CM | POA: Diagnosis not present

## 2022-09-16 DIAGNOSIS — E222 Syndrome of inappropriate secretion of antidiuretic hormone: Secondary | ICD-10-CM | POA: Diagnosis present

## 2022-09-16 DIAGNOSIS — Z6825 Body mass index (BMI) 25.0-25.9, adult: Secondary | ICD-10-CM

## 2022-09-16 DIAGNOSIS — Z95828 Presence of other vascular implants and grafts: Secondary | ICD-10-CM

## 2022-09-16 DIAGNOSIS — Z8501 Personal history of malignant neoplasm of esophagus: Secondary | ICD-10-CM | POA: Diagnosis not present

## 2022-09-16 DIAGNOSIS — E43 Unspecified severe protein-calorie malnutrition: Secondary | ICD-10-CM | POA: Diagnosis present

## 2022-09-16 DIAGNOSIS — K9189 Other postprocedural complications and disorders of digestive system: Principal | ICD-10-CM

## 2022-09-16 DIAGNOSIS — D638 Anemia in other chronic diseases classified elsewhere: Secondary | ICD-10-CM | POA: Diagnosis present

## 2022-09-16 DIAGNOSIS — Z9889 Other specified postprocedural states: Secondary | ICD-10-CM

## 2022-09-16 DIAGNOSIS — J189 Pneumonia, unspecified organism: Secondary | ICD-10-CM | POA: Diagnosis not present

## 2022-09-16 DIAGNOSIS — Y832 Surgical operation with anastomosis, bypass or graft as the cause of abnormal reaction of the patient, or of later complication, without mention of misadventure at the time of the procedure: Secondary | ICD-10-CM | POA: Diagnosis present

## 2022-09-16 DIAGNOSIS — Z4659 Encounter for fitting and adjustment of other gastrointestinal appliance and device: Secondary | ICD-10-CM | POA: Diagnosis not present

## 2022-09-16 DIAGNOSIS — K760 Fatty (change of) liver, not elsewhere classified: Secondary | ICD-10-CM | POA: Diagnosis present

## 2022-09-16 DIAGNOSIS — B9620 Unspecified Escherichia coli [E. coli] as the cause of diseases classified elsewhere: Secondary | ICD-10-CM | POA: Diagnosis present

## 2022-09-16 DIAGNOSIS — J9602 Acute respiratory failure with hypercapnia: Secondary | ICD-10-CM | POA: Diagnosis not present

## 2022-09-16 DIAGNOSIS — J9601 Acute respiratory failure with hypoxia: Secondary | ICD-10-CM

## 2022-09-16 DIAGNOSIS — J690 Pneumonitis due to inhalation of food and vomit: Secondary | ICD-10-CM | POA: Diagnosis not present

## 2022-09-16 DIAGNOSIS — E8770 Fluid overload, unspecified: Secondary | ICD-10-CM | POA: Diagnosis not present

## 2022-09-16 DIAGNOSIS — Z9911 Dependence on respirator [ventilator] status: Secondary | ICD-10-CM | POA: Diagnosis not present

## 2022-09-16 DIAGNOSIS — G9341 Metabolic encephalopathy: Secondary | ICD-10-CM | POA: Diagnosis not present

## 2022-09-16 DIAGNOSIS — C160 Malignant neoplasm of cardia: Secondary | ICD-10-CM | POA: Diagnosis present

## 2022-09-16 DIAGNOSIS — Z923 Personal history of irradiation: Secondary | ICD-10-CM

## 2022-09-16 DIAGNOSIS — I493 Ventricular premature depolarization: Secondary | ICD-10-CM | POA: Diagnosis not present

## 2022-09-16 DIAGNOSIS — R1319 Other dysphagia: Secondary | ICD-10-CM | POA: Diagnosis present

## 2022-09-16 DIAGNOSIS — D649 Anemia, unspecified: Secondary | ICD-10-CM | POA: Diagnosis not present

## 2022-09-16 DIAGNOSIS — R1314 Dysphagia, pharyngoesophageal phase: Secondary | ICD-10-CM | POA: Diagnosis present

## 2022-09-16 DIAGNOSIS — K3189 Other diseases of stomach and duodenum: Secondary | ICD-10-CM | POA: Diagnosis not present

## 2022-09-16 DIAGNOSIS — J948 Other specified pleural conditions: Secondary | ICD-10-CM | POA: Diagnosis present

## 2022-09-16 DIAGNOSIS — R627 Adult failure to thrive: Secondary | ICD-10-CM | POA: Diagnosis present

## 2022-09-16 DIAGNOSIS — Z9049 Acquired absence of other specified parts of digestive tract: Secondary | ICD-10-CM

## 2022-09-16 DIAGNOSIS — K2289 Other specified disease of esophagus: Secondary | ICD-10-CM | POA: Diagnosis not present

## 2022-09-16 DIAGNOSIS — R6521 Severe sepsis with septic shock: Secondary | ICD-10-CM | POA: Diagnosis not present

## 2022-09-16 DIAGNOSIS — Z66 Do not resuscitate: Secondary | ICD-10-CM | POA: Diagnosis not present

## 2022-09-16 DIAGNOSIS — J869 Pyothorax without fistula: Secondary | ICD-10-CM | POA: Diagnosis present

## 2022-09-16 DIAGNOSIS — J80 Acute respiratory distress syndrome: Secondary | ICD-10-CM | POA: Diagnosis not present

## 2022-09-16 DIAGNOSIS — I4891 Unspecified atrial fibrillation: Secondary | ICD-10-CM | POA: Diagnosis not present

## 2022-09-16 DIAGNOSIS — L89156 Pressure-induced deep tissue damage of sacral region: Secondary | ICD-10-CM | POA: Diagnosis not present

## 2022-09-16 DIAGNOSIS — E872 Acidosis, unspecified: Secondary | ICD-10-CM | POA: Diagnosis present

## 2022-09-16 DIAGNOSIS — G9340 Encephalopathy, unspecified: Secondary | ICD-10-CM | POA: Diagnosis not present

## 2022-09-16 DIAGNOSIS — T380X5A Adverse effect of glucocorticoids and synthetic analogues, initial encounter: Secondary | ICD-10-CM | POA: Diagnosis not present

## 2022-09-16 DIAGNOSIS — Z9221 Personal history of antineoplastic chemotherapy: Secondary | ICD-10-CM

## 2022-09-16 DIAGNOSIS — C159 Malignant neoplasm of esophagus, unspecified: Secondary | ICD-10-CM | POA: Diagnosis present

## 2022-09-16 DIAGNOSIS — R739 Hyperglycemia, unspecified: Secondary | ICD-10-CM | POA: Diagnosis not present

## 2022-09-16 DIAGNOSIS — Z515 Encounter for palliative care: Secondary | ICD-10-CM | POA: Diagnosis not present

## 2022-09-16 DIAGNOSIS — E878 Other disorders of electrolyte and fluid balance, not elsewhere classified: Secondary | ICD-10-CM | POA: Diagnosis not present

## 2022-09-16 DIAGNOSIS — E440 Moderate protein-calorie malnutrition: Secondary | ICD-10-CM | POA: Diagnosis present

## 2022-09-16 DIAGNOSIS — R5381 Other malaise: Secondary | ICD-10-CM | POA: Diagnosis present

## 2022-09-16 LAB — COMPREHENSIVE METABOLIC PANEL
ALT: 136 U/L — ABNORMAL HIGH (ref 0–44)
AST: 69 U/L — ABNORMAL HIGH (ref 15–41)
Albumin: 1.9 g/dL — ABNORMAL LOW (ref 3.5–5.0)
Alkaline Phosphatase: 137 U/L — ABNORMAL HIGH (ref 38–126)
Anion gap: 10 (ref 5–15)
BUN: 23 mg/dL (ref 8–23)
CO2: 21 mmol/L — ABNORMAL LOW (ref 22–32)
Calcium: 9 mg/dL (ref 8.9–10.3)
Chloride: 98 mmol/L (ref 98–111)
Creatinine, Ser: 0.76 mg/dL (ref 0.61–1.24)
GFR, Estimated: >60 mL/min (ref >60)
Glucose, Bld: 157 mg/dL — ABNORMAL HIGH (ref 70–99)
Potassium: 4.5 mmol/L (ref 3.5–5.1)
Sodium: 129 mmol/L — ABNORMAL LOW (ref 135–145)
Total Bilirubin: 0.6 mg/dL (ref 0.3–1.2)
Total Protein: 6.9 g/dL (ref 6.5–8.1)

## 2022-09-16 LAB — CBC
HCT: 27.9 % — ABNORMAL LOW (ref 39.0–52.0)
Hemoglobin: 9.3 g/dL — ABNORMAL LOW (ref 13.0–17.0)
MCH: 31.1 pg (ref 26.0–34.0)
MCHC: 33.3 g/dL (ref 30.0–36.0)
MCV: 93.3 fL (ref 80.0–100.0)
Platelets: 255 10*3/uL (ref 150–400)
RBC: 2.99 MIL/uL — ABNORMAL LOW (ref 4.22–5.81)
RDW: 13.5 % (ref 11.5–15.5)
WBC: 7.5 10*3/uL (ref 4.0–10.5)
nRBC: 0 % (ref 0.0–0.2)

## 2022-09-16 LAB — PREALBUMIN: Prealbumin: 7 mg/dL — ABNORMAL LOW (ref 18–38)

## 2022-09-16 LAB — HIV ANTIBODY (ROUTINE TESTING W REFLEX): HIV Screen 4th Generation wRfx: NONREACTIVE

## 2022-09-16 MED ORDER — PIPERACILLIN-TAZOBACTAM 3.375 G IVPB
3.3750 g | Freq: Three times a day (TID) | INTRAVENOUS | Status: DC
  Administered 2022-09-17 – 2022-10-28 (×125): 3.375 g via INTRAVENOUS
  Filled 2022-09-16 (×129): qty 50

## 2022-09-16 MED ORDER — VANCOMYCIN HCL 1500 MG/300ML IV SOLN
1500.0000 mg | Freq: Once | INTRAVENOUS | Status: DC
  Filled 2022-09-16: qty 300

## 2022-09-16 MED ORDER — FAMOTIDINE 20 MG PO TABS: 20.0000 mg | ORAL_TABLET | Freq: Two times a day (BID) | ORAL | Status: DC

## 2022-09-16 MED ORDER — LOPERAMIDE HCL 1 MG/7.5ML PO SUSP
2.0000 mg | ORAL | Status: DC | PRN
  Administered 2022-09-23 – 2022-09-25 (×4): 2 mg
  Filled 2022-09-16 (×6): qty 15

## 2022-09-16 MED ORDER — IOHEXOL 350 MG/ML SOLN
75.0000 mL | Freq: Once | INTRAVENOUS | Status: AC | PRN
  Administered 2022-09-16: 75 mL via INTRAVENOUS

## 2022-09-16 MED ORDER — CHLORHEXIDINE GLUCONATE CLOTH 2 % EX PADS
6.0000 | MEDICATED_PAD | Freq: Every day | CUTANEOUS | Status: DC
  Administered 2022-09-16 – 2022-09-22 (×6): 6 via TOPICAL

## 2022-09-16 MED ORDER — VANCOMYCIN HCL 1500 MG/300ML IV SOLN
1500.0000 mg | Freq: Once | INTRAVENOUS | Status: AC
  Administered 2022-09-16: 1500 mg via INTRAVENOUS
  Filled 2022-09-16: qty 300

## 2022-09-16 MED ORDER — FLUCONAZOLE 40 MG/ML PO SUSR: 400.0000 mg | Freq: Every day | ORAL | Status: DC

## 2022-09-16 MED ORDER — ACETAMINOPHEN 160 MG/5ML PO SOLN
650.0000 mg | Freq: Four times a day (QID) | ORAL | Status: DC | PRN
  Administered 2022-09-17 – 2022-11-08 (×43): 650 mg
  Filled 2022-09-16 (×45): qty 20.3

## 2022-09-16 MED ORDER — CHLORHEXIDINE GLUCONATE CLOTH 2 % EX PADS: 6.0000 | MEDICATED_PAD | Freq: Every day | CUTANEOUS | Status: DC

## 2022-09-16 MED ORDER — HYDROCODONE-ACETAMINOPHEN 7.5-325 MG/15ML PO SOLN
10.0000 mL | ORAL | Status: DC | PRN
  Administered 2022-09-16 – 2022-11-05 (×67): 10 mL
  Filled 2022-09-16 (×78): qty 15

## 2022-09-16 MED ORDER — ONDANSETRON HCL 4 MG/2ML IJ SOLN
4.0000 mg | Freq: Four times a day (QID) | INTRAMUSCULAR | Status: DC | PRN
  Administered 2022-09-28 – 2022-11-03 (×6): 4 mg via INTRAVENOUS
  Filled 2022-09-16 (×6): qty 2

## 2022-09-16 MED ORDER — OSMOLITE 1.5 CAL PO LIQD
90.0000 mL/h | ORAL | Status: DC
  Administered 2022-09-16: 90 mL/h

## 2022-09-16 MED ORDER — ACETAMINOPHEN 160 MG/5ML PO SOLN: 650.0000 mg | Freq: Three times a day (TID) | ORAL | Status: DC | PRN

## 2022-09-16 MED ORDER — SODIUM CHLORIDE 0.9 % IV SOLN: INTRAVENOUS | Status: DC

## 2022-09-16 MED ORDER — PROSOURCE TF20 ENFIT COMPATIBL EN LIQD
60.0000 mL | Freq: Every day | ENTERAL | Status: DC
  Administered 2022-09-17: 60 mL
  Filled 2022-09-16: qty 60

## 2022-09-16 MED ORDER — ENOXAPARIN SODIUM 40 MG/0.4ML IJ SOSY
40.0000 mg | PREFILLED_SYRINGE | INTRAMUSCULAR | Status: DC
  Administered 2022-09-16 – 2022-09-26 (×9): 40 mg via SUBCUTANEOUS
  Filled 2022-09-16 (×10): qty 0.4

## 2022-09-16 MED ORDER — SODIUM CHLORIDE 0.9% FLUSH
30.0000 mL | Freq: Three times a day (TID) | INTRAVENOUS | Status: DC
  Administered 2022-09-16 – 2022-10-10 (×58): 30 mL
  Administered 2022-10-10: 10 mL
  Administered 2022-10-10 – 2022-10-21 (×21): 30 mL

## 2022-09-16 MED ORDER — VANCOMYCIN HCL 1250 MG/250ML IV SOLN
1250.0000 mg | Freq: Two times a day (BID) | INTRAVENOUS | Status: AC
  Administered 2022-09-17 – 2022-09-24 (×15): 1250 mg via INTRAVENOUS
  Filled 2022-09-16 (×17): qty 250

## 2022-09-16 MED ORDER — FLUCONAZOLE IN SODIUM CHLORIDE 400-0.9 MG/200ML-% IV SOLN
800.0000 mg | INTRAVENOUS | Status: DC
  Administered 2022-09-16: 400 mg via INTRAVENOUS
  Administered 2022-09-17 – 2022-09-19 (×3): 800 mg via INTRAVENOUS
  Administered 2022-09-20 – 2022-09-22 (×3): 400 mg via INTRAVENOUS
  Administered 2022-09-23: 800 mg via INTRAVENOUS
  Administered 2022-09-24 – 2022-09-25 (×3): 400 mg via INTRAVENOUS
  Administered 2022-09-26: 800 mg via INTRAVENOUS
  Filled 2022-09-16 (×12): qty 400

## 2022-09-16 MED ORDER — PANTOPRAZOLE SODIUM 40 MG IV SOLR
40.0000 mg | INTRAVENOUS | Status: DC
  Administered 2022-09-16 – 2022-11-18 (×63): 40 mg via INTRAVENOUS
  Filled 2022-09-16 (×64): qty 10

## 2022-09-16 NOTE — H&P (Signed)
[address]       [place] [address]             [phone]                    Calum L Flegal [facility]o Medical Record #[id number] Date of Birth: Nov 13, 1960  Referring: Lajuana Matte, MD Primary Care: Masneri, Adele Barthel, DO Primary Cardiologist: None  Chief Complaint:   No chief complaint on file.   History of Present Illness:    [name] [name] 61 y.o. male is admitted from clinic today with new onset fevers, and purulent drainage from his chest tubes.  He has some worsening shortness of breath, and generalized weakness.      Past Medical History:  Diagnosis Date   Cancer [facility])    Esophageal Cancer   History of radiation therapy    Esophagus- 05/12/22-06/22/22- Dr. Gery Pray   Port-A-Cath in place 05/11/2022    Past Surgical History:  Procedure Laterality Date   BIOPSY  03/25/2022   Procedure: BIOPSY;  Surgeon: Harvel Quale, MD;  [facility]: [facility];  Service: Gastroenterology;;  GE junction;   BIOPSY  04/25/2022   Procedure: BIOPSY;  Surgeon: Irving Copas., MD;  Location: [facility];  Service: Gastroenterology;;   COLONOSCOPY     COLONOSCOPY WITH PROPOFOL N/A 03/25/2022   Procedure: COLONOSCOPY WITH PROPOFOL;  Surgeon: Harvel Quale, MD;  [facility];  Service: Gastroenterology;  Laterality: N/A;  945 ASA 1   ESOPHAGOGASTRODUODENOSCOPY N/A 08/18/2022   Procedure: ESOPHAGOGASTRODUODENOSCOPY (EGD);  Surgeon: Lajuana Matte, MD;  Location: [facility] OR;  Service: Thoracic;  Laterality: N/A;   ESOPHAGOGASTRODUODENOSCOPY N/A 08/29/2022   Procedure: ESOPHAGOGASTRODUODENOSCOPY (EGD);  Surgeon: Lajuana Matte, MD;  Location: [place];  Service: Thoracic;  Laterality: N/A;  will need C arm   ESOPHAGOGASTRODUODENOSCOPY (EGD) WITH PROPOFOL N/A 03/25/2022   Procedure: ESOPHAGOGASTRODUODENOSCOPY (EGD) WITH PROPOFOL;  Surgeon: Harvel Quale, MD;  [facility]: [facility];  Service:  Gastroenterology;  Laterality: N/A;   ESOPHAGOGASTRODUODENOSCOPY (EGD) WITH PROPOFOL N/A 04/25/2022   Procedure: ESOPHAGOGASTRODUODENOSCOPY (EGD) WITH PROPOFOL;  Surgeon: Rush Landmark Telford Nab., MD;  Location: [facility];  Service: Gastroenterology;  Laterality: N/A;   INTERCOSTAL NERVE BLOCK Right 08/18/2022   Procedure: INTERCOSTAL NERVE BLOCK;  Surgeon: Lajuana Matte, MD;  Location: [place];  Service: Thoracic;  Laterality: Right;   IR IMAGING GUIDED PORT INSERTION  05/04/2022   LIPOMA EXCISION     years ago   UPPER ESOPHAGEAL ENDOSCOPIC ULTRASOUND (EUS) N/A 04/25/2022   Procedure: UPPER ESOPHAGEAL ENDOSCOPIC ULTRASOUND (EUS);  Surgeon: Irving Copas., MD;  Location: [facility];  Service: Gastroenterology;  Laterality: N/A;   VIDEO ASSISTED THORACOSCOPY Right 08/29/2022   Procedure: VIDEO ASSISTED THORACOSCOPY;  Surgeon: Lajuana Matte, MD;  Location: [facility] OR;  Service: Thoracic;  Laterality: Right;    Family History  Problem Relation Age of Onset   Cancer Maternal Grandmother        Breast cancer   Cancer Other        Maternal Cousin - breast cancer     Social History   Tobacco Use  Smoking Status Never   Passive exposure: Past  Smokeless Tobacco Never    Social History   Substance and Sexual Activity  Alcohol Use Yes   Comment: 2-3 per week     No Known Allergies  Current Facility-Administered Medications  Medication Dose Route Frequency Provider Last Rate  Last Admin   piperacillin-tazobactam (ZOSYN) IVPB 3.375 g  3.375 g Intravenous [address], [place]       [START ON 09/17/2022] vancomycin (VANCOREADY) IVPB 1250 mg/250 mL  1,250 mg Intravenous Q12H Kechi, Donalynn Furlong, RPH       vancomycin (VANCOREADY) IVPB 1500 mg/300 mL  1,500 mg Intravenous Once [place], [place] [address], [place]        Review of Systems  Constitutional:  Positive for fever and malaise/fatigue. Negative for weight loss.  Respiratory:  Positive for cough and shortness of  breath.   Cardiovascular:  Positive for chest pain.    PHYSICAL EXAMINATION: Temp 98.4 F (36.9 C) (Oral)   Physical Exam Constitutional:      General: He is not in acute distress.    Appearance: He is ill-appearing.  HENT:     Head: Normocephalic and atraumatic.  Cardiovascular:     Rate and Rhythm: Normal rate.  Pulmonary:     Effort: No respiratory distress.     Breath sounds: Wheezing present.  Abdominal:     General: Abdomen is flat. There is distension.  Neurological:     General: No focal deficit present.     Mental Status: He is alert and oriented to person, place, and time.      Diagnostic Studies & Laboratory data:     Recent Radiology Findings:   [place] EKG SITE RITE  Result Date: 09/05/2022 If Site Rite image not attached, placement could not be confirmed due to current cardiac rhythm.  DG CHEST PORT 1 VIEW  Result Date: 09/04/2022 CLINICAL DATA:  [id number] with pleural effusion, right chest tubes esophageal stenting. EXAM: PORTABLE CHEST 1 VIEW COMPARISON:  Portable chest 09/01/2022 FINDINGS: 5:49 a.m. right-sided chest tubes are unchanged in positioning. Smooth thickening of the right pleural margins appears to be due to pleural fluid. There is no visible pneumothorax today. There are streaky opacities in the hypoexpanded right lower lung field which could be atelectasis or infiltrates. Small layering left pleural effusion appears similar with left lung otherwise clear. Stable mediastinal configuration with esophageal stent. The cardiac size is normal. Right IJ port catheter again terminates at about the superior cavoatrial junction. IMPRESSION: 1. No visible pneumothorax today. 2. Right pleural fluid appears similar to the most recent prior study. 3. Streaky opacities in the hypoexpanded right lower lung field could be atelectasis or infiltrates. 4. Unchanged small left pleural effusion. Electronically Signed   By: Telford Nab M.D.   On: 09/04/2022 07:46   DG  ESOPHAGUS W SINGLE CM (SOL OR THIN BA)  Result Date: 09/01/2022 CLINICAL DATA:  2 weeks status post Ivor Lewis esophagectomy complicated by postoperative anastomotic leak treated with covered stent placement 3 days ago. EXAM: ESOPHAGRAM TECHNIQUE: Single contrast examination was performed using water soluble contrast. This exam was performed by Pasty Spillers, PA-C, and was supervised and interpreted by Logan Bores, MD. FLUOROSCOPY: Radiation Exposure Index (as provided by the fluoroscopic device): 24.70 mGy Kerma COMPARISON:  Chest CT 08/28/2022. Esophagram 08/22/2022. Chest radiograph 08/29/2022. FINDINGS: An esophageal stent appears unchanged in position compared to the 08/29/2022 chest radiograph. Contrast was swallowed without difficulty and easily traverses the esophageal stent and accumulates in the stomach. Contrast was subsequently seen to surround the distal aspect of the stent and migrate proximally along the left lateral aspect of the stent, presumably between the outer wall of the stent and the gastric pull-through. This extends cephalad to the level of the carina where there is subsequent progressive accumulation of  contrast within a cavity lateral to the esophagus on the right, either in the mediastinum or pleural space in the region of the anastomotic defect shown on the prior CT. IMPRESSION: Persistent esophageal leak, with contrast entering the stomach and then appearing to migrate proximally along the outer stent wall and through the previously identified anastomotic defect into the right hemithorax. No apparent stent migration. Findings communicated to Dr. Kipp Brood on 09/01/2022 at 1318 hours. Electronically Signed   By: Logan Bores M.D.   On: 09/01/2022 14:07   DG CHEST PORT 1 VIEW  Result Date: 09/01/2022 CLINICAL DATA:  [id number] Pleural effusion [id number] EXAM: PORTABLE CHEST 1 VIEW COMPARISON:  August 29, 2022 August 30, 2022 FINDINGS: The cardiomediastinal silhouette is unchanged  in contour.Similar positioning of a esophageal stent. RIGHT-sided chest tubes. RIGHT-sided chest port with tip terminating over the superior cavoatrial junction. There is a RIGHT-sided favored hydropneumothorax with a decreased fluid component since November fourteenth, 2023. There is a trace air component. Continued improvement in aeration of the RIGHT lung with scattered linear opacities most consistent with atelectasis. LEFT basilar heterogeneous opacities, likely atelectasis with differential considerations including aspiration. IMPRESSION: 1. RIGHT-sided hydropneumothorax with a decreased fluid component since November fourteenth, 2023. 2. Continued improvement in aeration of the RIGHT lung. Electronically Signed   By: Valentino Saxon M.D.   On: 09/01/2022 07:52   DG Chest Port 1 View  Result Date: 08/30/2022 CLINICAL DATA:  Anastomotic leak following esophagectomy. Chest pain EXAM: PORTABLE CHEST 1 VIEW COMPARISON:  AP chest 08/29/2022, 08/28/2022, 08/26/2022; CT chest abdomen and pelvis 08/28/2022 FINDINGS: Redemonstration of esophageal stent, appearing unchanged in position. Right chest wall porta catheter with tip overlying the central superior vena cava, unchanged. Right-sided chest tube with tip overlying the superior right hemithorax. Additional more lateral right-sided chest tube again while the sharp turn overlying the severe right hemithorax with the distal aspect extending inferiorly and the tip overlying the lateral right lower lung. There are again loculated pleural effusions along the lateral and inferior aspect of the right hemithorax. Mild improved aeration of the right lung. The left lung is again relatively clear. No acute skeletal abnormality. IMPRESSION: 1. Mildly improved aeration of the right lung with stable to slightly improved loculated pleural effusions along the lateral and inferior aspect of the right hemithorax. 2. Redemonstration of esophageal stent. Unchanged position of  right-sided chest tubes. Electronically Signed   By: Yvonne Kendall M.D.   On: 08/30/2022 08:37   DG Chest Port 1 View  Result Date: 08/29/2022 CLINICAL DATA:  Anastomotic leak following esophagectomy EXAM: PORTABLE CHEST 1 VIEW COMPARISON:  08/28/2022, CT 08/28/2022, radiograph 08/15/2022, 08/18/2022 FINDINGS: Right-sided central venous port tip at the cavoatrial region. Interim placement of a mediastinal/esophageal stent. Right-sided chest tube with tip at apex similar compared to prior. Suspect new right-sided chest tube which appears folded back upon itself in the apical region of the right lung with tip in the right lateral lower lung. Loculated pleural effusion/collections may be slightly increased. Poor aeration of right thorax. Persistent partial consolidation left lung base. IMPRESSION: 1. Slight increased loculated pleural collections/fluid on the right. Suspect additional right chest tube with tubing folded back upon itself at right apex, tip appears to be within the right lower lateral chest. 2. Interval placement of esophageal stent 3. Slightly improved aeration of left lung base but with residual partial consolidation. Electronically Signed   By: Donavan Foil M.D.   On: 08/29/2022 18:44   DG C-Arm 1-60 Min-No Report  Result  Date: 08/29/2022 Fluoroscopy was utilized by the requesting physician.  No radiographic interpretation.   CT CHEST ABDOMEN PELVIS W CONTRAST  Result Date: 08/28/2022 CLINICAL DATA:  Status post esophagectomy. Evaluate for anastomotic leak/abscess in chest or abdomen. * Tracking Code: BO * EXAM: CT CHEST, ABDOMEN, AND PELVIS WITH CONTRAST TECHNIQUE: Multidetector CT imaging of the chest, abdomen and pelvis was performed following the standard protocol during bolus administration of intravenous contrast. RADIATION DOSE REDUCTION: This exam was performed according to the departmental dose-optimization program which includes automated exposure control, adjustment of the  mA and/or kV according to patient size and/or use of iterative reconstruction technique. CONTRAST:  31m OMNIPAQUE IOHEXOL 350 MG/ML SOLN COMPARISON:  Chest radiographs, most recent earlier today. Esophagram 08/22/2022. PET 07/21/2022. Preoperative CTs of 03/30/2022. FINDINGS: CT CHEST FINDINGS Cardiovascular: Right Port-A-Cath tip mid right atrium. Aortic atherosclerosis. Normal heart size, without pericardial effusion. Lad coronary artery calcification. No central pulmonary embolism, on this non-dedicated study. Mediastinum/Nodes: Calcified right-sided thyroid nodule has been characterized previously including on prior PET. Right paratracheal 1.0 cm node on 09/03 is newly enlarged, favored to be reactive. No hilar adenopathy. Small volume pneumomediastinum. Surgical changes of partial esophagectomy and gastric pull-through. At the level of the gastro/esophageal anastomosis, there is a right-sided defect which communicates with the right pleural space. Example 21/3. There is gas adjacent to more distal surgical sutures, at the site of gastric pull-through at the diaphragmatic hiatus, but no well-defined communication (43/3). Lungs/Pleura: Moderate, multifocal loculated right-sided hydropneumothorax. The largest gas and air pocket is identified anteriorly including at 10.1 x 7.1 cm on 61/5. Small simple appearing left-sided pleural effusion. A right chest tube terminates within the superior right pleural space posteriorly. Relatively diffuse, slightly lower lung predominant right-sided airspace disease is primarily felt to represent compressive atelectasis secondary to loculated pleural fluid. Dependent left lower lobe atelectasis. Musculoskeletal: No acute osseous abnormality. CT ABDOMEN PELVIS FINDINGS Hepatobiliary: Normal liver. Normal gallbladder, without biliary ductal dilatation. Pancreas: Normal, without mass or ductal dilatation. Spleen: Normal in size, without focal abnormality. Adrenals/Urinary Tract:  Normal adrenal glands. Normal kidneys, without hydronephrosis. Normal urinary bladder. Stomach/Bowel: Normal distal stomach. Left-sided colonic underdistention. Normal terminal ileum and appendix. Percutaneous jejunostomy tube. Otherwise normal small bowel. Vascular/Lymphatic: Aortic atherosclerosis. No abdominopelvic adenopathy. Reproductive: Normal prostate. Other: Right perihepatic small volume fluid and gas anteriorly, including on 50/3. No free air or fluid more caudally. Musculoskeletal: No acute osseous abnormality. IMPRESSION: 1. Status post partial esophagectomy and gastric pull-through. Defect at the site of the esophageal-gastric anastomosis, at the craniocaudal level of the carina. This communicates with the right pleural space. 2. Secondary moderate volume, multifocal right-sided loculated hydropneumothorax. Right-sided airspace disease, primarily felt to represent compressive atelectasis. 3. Perihepatic gas and fluid, favored to be extension from the right hemithorax. No more caudal extraluminal gas or fluid identified. 4. Small volume pneumomediastinum. 5. New high mediastinal adenopathy, favored to be reactive. Recommend attention on follow-up. 6. Small left pleural effusion and compressive atelectasis. 7. Coronary artery atherosclerosis. Aortic Atherosclerosis (ICD10-I70.0). These results will be called to the ordering clinician or representative by the Radiologist Assistant, and communication documented in the PACS or [employer] Electronically Signed   By: KAbigail MiyamotoM.D.   On: 08/28/2022 14:51   DG CHEST PORT 1 VIEW  Result Date: 08/28/2022 CLINICAL DATA:  History of esophagectomy EXAM: PORTABLE CHEST 1 VIEW COMPARISON:  08/26/2022 FINDINGS: Right Port-A-Cath tip at low SVC. Right-sided chest tube is unchanged in position. Mild cardiomegaly. No pneumothorax. Significantly worsened right-sided aeration,  likely due to a combination of pleural fluid and airspace disease. Mild increase  in left base opacity. No pneumothorax. IMPRESSION: Significantly worsened right-sided aeration, likely due to a combination of pleural fluid and airspace disease. Right-sided chest tube in place, without pneumothorax. Progressive left base airspace disease, likely atelectasis. Electronically Signed   By: Abigail Miyamoto M.D.   On: 08/28/2022 09:09   DG Chest Port 1 View  Result Date: 08/26/2022 CLINICAL DATA:  Status post esophagectomy EXAM: PORTABLE CHEST 1 VIEW COMPARISON:  08/23/2022 FINDINGS: Cardiomegaly. Right chest port catheter. Right-sided chest tube. Heterogeneous bibasilar airspace opacity increased compared to prior examination with probable small layering pleural effusions. No significant pneumothorax. IMPRESSION: 1. Right-sided chest tube. No significant pneumothorax. 2. Heterogeneous bibasilar airspace opacity increased compared to prior examination with probable small layering pleural effusions. 3. Cardiomegaly. Electronically Signed   By: Delanna Ahmadi M.D.   On: 08/26/2022 11:31   DG CHEST PORT 1 VIEW  Result Date: 08/23/2022 CLINICAL DATA:  Status post esophagectomy 08/18/2022 and chest tube removal EXAM: PORTABLE CHEST 1 VIEW COMPARISON:  Chest radiograph dated 08/21/2022 FINDINGS: Lines/tubes: Interval removal of 1 right-sided chest tube. An additional right-sided chest tube remains. Right chest wall port tip projects over the right atrium. Chest: Low lung volumes with persistent bibasilar atelectasis. Pleura: No definite pneumothorax. Trace right pleural effusion. Persistent pneumoperitoneum. Heart/mediastinum: Enlarged cardiomediastinal silhouette is likely projectional. Decreased pneumopericardium. Bones: No acute osseous abnormality. IMPRESSION: 1. Interval removal of 1 right-sided chest tube. An additional right-sided chest tube remains. 2. No definite pneumothorax.  Trace right pleural effusion. 3. Persistent pneumoperitoneum. 4. Persistent bibasilar atelectasis. Electronically  Signed   By: Darrin Nipper M.D.   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 EXAM: ESOPHAGUS/BARIUM SWALLOW/TABLET STUDY TECHNIQUE: Single contrast examination was performed using Omnipaque 300. This exam was performed by Candiss Norse, PA-C, and was supervised and interpreted by Nelson Chimes, MD. FLUOROSCOPY: Radiation Exposure Index (as provided by the fluoroscopic device): 1 minute 42 seconds 798.09 micro gray meter squared. COMPARISON:  None Available. FINDINGS: Swallowing: Appears normal. No vestibular penetration or aspiration seen. Pharynx: Unremarkable. Esophagus: Diffusely dilated esophagus. Post surgical changes of the distal esophagus and stomach. No leak noted in supine LPO, RPO and AP projections. Esophageal motility: Within normal limits for post operative state. Hiatal Hernia: None. Gastroesophageal reflux: None visualized. Ingested 13 mm barium tablet: Not given Other: None. IMPRESSION: Water soluble contrast esophagram without evidence of post operative leak. Electronically Signed   By: Nelson Chimes M.D.   On: 08/22/2022 10:11   DG Chest Port 1 View  Result Date: 08/21/2022 CLINICAL DATA:  Status post esophagectomy EXAM: PORTABLE CHEST 1 VIEW COMPARISON:  08/19/2022 FINDINGS: Gastric catheter is again noted in satisfactory position. Right sided chest wall port is seen. Two chest tubes are noted on the right stable in appearance from the prior exam. Decreasing free air in the upper abdomen is noted. Basilar atelectasis is noted on the left. No pneumothorax is seen. Stable pneumopericardium is noted as well. IMPRESSION: No evidence of pneumothorax. Decreasing free air in the abdomen. Mild left basilar atelectasis. Electronically Signed   By: Inez Catalina  M.D.   On: 08/21/2022 09:16   DG Chest 1 View  Result Date: 08/19/2022 CLINICAL DATA:  Status  post esophagectomy EXAM: CHEST  1 VIEW COMPARISON:  Chest radiograph from one day prior. FINDINGS: Enteric tube terminates in the distal body of the stomach. Right internal jugular central venous catheter terminates over the cavoatrial junction. Two right apical chest tubes are in place. Stable cardiomediastinal silhouette with top-normal heart size and with scattered mild left pericardiac pneumomediastinum. No pneumothorax. No pleural effusion. Low lung volumes with increased patchy bibasilar lung opacities, left greater than right. No pulmonary edema. Stable free air under the right hemidiaphragm. Stable mild subcutaneous emphysema in the lateral lower right chest wall. IMPRESSION: 1. No pneumothorax. Right apical chest tubes in place. 2. Low lung volumes with increased patchy bibasilar lung opacities, left greater than right, favor atelectasis. 3. Stable free air under the right hemidiaphragm, presumably due to recent surgery. 4. Stable mild left pericardiac pneumomediastinum. Electronically Signed   By: Ilona Sorrel M.D.   On: 08/19/2022 08:47   DG Chest Port 1 View  Result Date: 08/18/2022 CLINICAL DATA:  Prior esophagectomy EXAM: PORTABLE CHEST 1 VIEW COMPARISON:  Chest x-ray dated August 15, 2022 FINDINGS: Right chest wall port unchanged in position. Enteric tube partially seen coursing below the diaphragm. Two right-sided chest tubes. Cardiac and mediastinal contours within normal limits for AP technique. Pneumomediastinum pneumoperitoneum. Left-greater-than-right basilar opacities, likely due to atelectasis. No large pleural effusion evidence of pneumothorax. IMPRESSION: 1. Two right-sided chest tubes in place. No evidence of pneumothorax. 2. Pneumomediastinum and pneumoperitoneum, likely postsurgical. Electronically Signed   By: Yetta Glassman M.D.   On: 08/18/2022 16:00       I have independently  reviewed the above radiology studies  and reviewed the findings with the patient.   Recent Lab Findings: Lab Results  Component Value Date   WBC 9.2 09/14/2022   HGB 9.6 (L) 09/14/2022   HCT 28.5 (L) 09/14/2022   PLT 306 09/14/2022   GLUCOSE 150 (H) 09/14/2022   ALT 159 (H) 09/14/2022   AST 87 (H) 09/14/2022   NA 125 (L) 09/14/2022   K 4.4 09/14/2022   CL 91 (L) 09/14/2022   CREATININE 0.62 09/14/2022   BUN 22 09/14/2022   CO2 24 09/14/2022   INR 1.1 08/15/2022         Assessment / Plan:   61 year old male status post robotic assisted Ivor Lewis esophagectomy complicated by anastomotic leak due to severe protein malnutrition.  He is failing to thrive at home, and has had worsening fevers drainage.  He will be admitted to reassess his clinical status.  He will require CT's chest, broad-spectrum antibiotics and antifungals.  Will resume his tube feeds and check nutritional labs.      Lajuana Matte 09/18/2022 2:07 PM

## 2022-09-16 NOTE — Progress Notes (Signed)
[name]       [name], [name] [name]             [phone]                    [name] [place] Medical Record #[id number] Date of Birth: February 07, 1961  Referring: Lajuana Matte, MD Primary Care: Masneri, Adele Barthel, DO Primary Cardiologist: None  Chief Complaint:   No chief complaint on file.   History of Present Illness:    [name] [name] 61 y.o. male is admitted from clinic today with new onset fevers, and purulent drainage from his chest tubes.  He has some worsening shortness of breath, and generalized weakness.      Past Medical History:  Diagnosis Date   Cancer [facility])    Esophageal Cancer   History of radiation therapy    Esophagus- 05/12/22-06/22/22- Dr. Gery Pray   Port-A-Cath in place 05/11/2022    Past Surgical History:  Procedure Laterality Date   BIOPSY  03/25/2022   Procedure: BIOPSY;  Surgeon: Harvel Quale, MD;  Location: [facility];  Service: Gastroenterology;;  GE junction;   BIOPSY  04/25/2022   Procedure: BIOPSY;  Surgeon: Irving Copas., MD;  Location: [facility];  Service: Gastroenterology;;   COLONOSCOPY     COLONOSCOPY WITH PROPOFOL N/A 03/25/2022   Procedure: COLONOSCOPY WITH PROPOFOL;  Surgeon: Harvel Quale, MD;  [facility];  Service: Gastroenterology;  Laterality: N/A;  945 ASA 1   ESOPHAGOGASTRODUODENOSCOPY N/A 08/18/2022   Procedure: ESOPHAGOGASTRODUODENOSCOPY (EGD);  Surgeon: Lajuana Matte, MD;  Location: [facility] OR;  Service: Thoracic;  Laterality: N/A;   ESOPHAGOGASTRODUODENOSCOPY N/A 08/29/2022   Procedure: ESOPHAGOGASTRODUODENOSCOPY (EGD);  Surgeon: Lajuana Matte, MD;  Location: [place];  Service: Thoracic;  Laterality: N/A;  will need C arm   ESOPHAGOGASTRODUODENOSCOPY (EGD) WITH PROPOFOL N/A 03/25/2022   Procedure: ESOPHAGOGASTRODUODENOSCOPY (EGD) WITH PROPOFOL;  Surgeon: Harvel Quale, MD;  [facility];  Service:  Gastroenterology;  Laterality: N/A;   ESOPHAGOGASTRODUODENOSCOPY (EGD) WITH PROPOFOL N/A 04/25/2022   Procedure: ESOPHAGOGASTRODUODENOSCOPY (EGD) WITH PROPOFOL;  Surgeon: Rush Landmark Telford Nab., MD;  Location: [facility];  Service: Gastroenterology;  Laterality: N/A;   INTERCOSTAL NERVE BLOCK Right 08/18/2022   Procedure: INTERCOSTAL NERVE BLOCK;  Surgeon: Lajuana Matte, MD;  Location: [place];  Service: Thoracic;  Laterality: Right;   IR IMAGING GUIDED PORT INSERTION  05/04/2022   LIPOMA EXCISION     years ago   UPPER ESOPHAGEAL ENDOSCOPIC ULTRASOUND (EUS) N/A 04/25/2022   Procedure: UPPER ESOPHAGEAL ENDOSCOPIC ULTRASOUND (EUS);  Surgeon: Irving Copas., MD;  Location: [facility];  Service: Gastroenterology;  Laterality: N/A;   VIDEO ASSISTED THORACOSCOPY Right 08/29/2022   Procedure: VIDEO ASSISTED THORACOSCOPY;  Surgeon: Lajuana Matte, MD;  Location: [facility] OR;  Service: Thoracic;  Laterality: Right;    Family History  Problem Relation Age of Onset   Cancer Maternal Grandmother        Breast cancer   Cancer Other        Maternal Cousin - breast cancer     Social History   Tobacco Use  Smoking Status Never   Passive exposure: Past  Smokeless Tobacco Never    Social History   Substance and Sexual Activity  Alcohol Use Yes   Comment: 2-3 per week     No Known Allergies  Current Facility-Administered Medications  Medication Dose Route Frequency Provider Last Rate Last  Admin   piperacillin-tazobactam (ZOSYN) IVPB 3.375 g  3.375 g Intravenous [address], [place]       [START ON 09/17/2022] vancomycin (VANCOREADY) IVPB 1250 mg/250 mL  1,250 mg Intravenous Q12H Guys, Donalynn Furlong, RPH       vancomycin (VANCOREADY) IVPB 1500 mg/300 mL  1,500 mg Intravenous Once [place], [place], [place]        Review of Systems  Constitutional:  Positive for fever and malaise/fatigue. Negative for weight loss.  Respiratory:  Positive for cough and shortness of  breath.   Cardiovascular:  Positive for chest pain.    PHYSICAL EXAMINATION: Temp 98.4 F (36.9 C) (Oral)   Physical Exam Constitutional:      General: He is not in acute distress.    Appearance: He is ill-appearing.  HENT:     Head: Normocephalic and atraumatic.  Cardiovascular:     Rate and Rhythm: Normal rate.  Pulmonary:     Effort: No respiratory distress.     Breath sounds: Wheezing present.  Abdominal:     General: Abdomen is flat. There is distension.  Neurological:     General: No focal deficit present.     Mental Status: He is alert and oriented to person, place, and time.      Diagnostic Studies & Laboratory data:     Recent Radiology Findings:   [place] EKG SITE RITE  Result Date: 09/05/2022 If Site Rite image not attached, placement could not be confirmed due to current cardiac rhythm.  DG CHEST PORT 1 VIEW  Result Date: 09/04/2022 CLINICAL DATA:  [id number] with pleural effusion, right chest tubes esophageal stenting. EXAM: PORTABLE CHEST 1 VIEW COMPARISON:  Portable chest 09/01/2022 FINDINGS: 5:49 a.m. right-sided chest tubes are unchanged in positioning. Smooth thickening of the right pleural margins appears to be due to pleural fluid. There is no visible pneumothorax today. There are streaky opacities in the hypoexpanded right lower lung field which could be atelectasis or infiltrates. Small layering left pleural effusion appears similar with left lung otherwise clear. Stable mediastinal configuration with esophageal stent. The cardiac size is normal. Right IJ port catheter again terminates at about the superior cavoatrial junction. IMPRESSION: 1. No visible pneumothorax today. 2. Right pleural fluid appears similar to the most recent prior study. 3. Streaky opacities in the hypoexpanded right lower lung field could be atelectasis or infiltrates. 4. Unchanged small left pleural effusion. Electronically Signed   By: Telford Nab M.D.   On: 09/04/2022 07:46   DG  ESOPHAGUS W SINGLE CM (SOL OR THIN BA)  Result Date: 09/01/2022 CLINICAL DATA:  2 weeks status post Ivor Lewis esophagectomy complicated by postoperative anastomotic leak treated with covered stent placement 3 days ago. EXAM: ESOPHAGRAM TECHNIQUE: Single contrast examination was performed using water soluble contrast. This exam was performed by Pasty Spillers, PA-C, and was supervised and interpreted by Logan Bores, MD. FLUOROSCOPY: Radiation Exposure Index (as provided by the fluoroscopic device): 24.70 mGy Kerma COMPARISON:  Chest CT 08/28/2022. Esophagram 08/22/2022. Chest radiograph 08/29/2022. FINDINGS: An esophageal stent appears unchanged in position compared to the 08/29/2022 chest radiograph. Contrast was swallowed without difficulty and easily traverses the esophageal stent and accumulates in the stomach. Contrast was subsequently seen to surround the distal aspect of the stent and migrate proximally along the left lateral aspect of the stent, presumably between the outer wall of the stent and the gastric pull-through. This extends cephalad to the level of the carina where there is subsequent progressive accumulation of contrast  within a cavity lateral to the esophagus on the right, either in the mediastinum or pleural space in the region of the anastomotic defect shown on the prior CT. IMPRESSION: Persistent esophageal leak, with contrast entering the stomach and then appearing to migrate proximally along the outer stent wall and through the previously identified anastomotic defect into the right hemithorax. No apparent stent migration. Findings communicated to Dr. Kipp Brood on 09/01/2022 at 1318 hours. Electronically Signed   By: Logan Bores M.D.   On: 09/01/2022 14:07   DG CHEST PORT 1 VIEW  Result Date: 09/01/2022 CLINICAL DATA:  [id number] Pleural effusion [id number] EXAM: PORTABLE CHEST 1 VIEW COMPARISON:  August 29, 2022 August 30, 2022 FINDINGS: The cardiomediastinal silhouette is unchanged  in contour.Similar positioning of a esophageal stent. RIGHT-sided chest tubes. RIGHT-sided chest port with tip terminating over the superior cavoatrial junction. There is a RIGHT-sided favored hydropneumothorax with a decreased fluid component since November fourteenth, 2023. There is a trace air component. Continued improvement in aeration of the RIGHT lung with scattered linear opacities most consistent with atelectasis. LEFT basilar heterogeneous opacities, likely atelectasis with differential considerations including aspiration. IMPRESSION: 1. RIGHT-sided hydropneumothorax with a decreased fluid component since November fourteenth, 2023. 2. Continued improvement in aeration of the RIGHT lung. Electronically Signed   By: Valentino Saxon M.D.   On: 09/01/2022 07:52   DG Chest Port 1 View  Result Date: 08/30/2022 CLINICAL DATA:  Anastomotic leak following esophagectomy. Chest pain EXAM: PORTABLE CHEST 1 VIEW COMPARISON:  AP chest 08/29/2022, 08/28/2022, 08/26/2022; CT chest abdomen and pelvis 08/28/2022 FINDINGS: Redemonstration of esophageal stent, appearing unchanged in position. Right chest wall porta catheter with tip overlying the central superior vena cava, unchanged. Right-sided chest tube with tip overlying the superior right hemithorax. Additional more lateral right-sided chest tube again while the sharp turn overlying the severe right hemithorax with the distal aspect extending inferiorly and the tip overlying the lateral right lower lung. There are again loculated pleural effusions along the lateral and inferior aspect of the right hemithorax. Mild improved aeration of the right lung. The left lung is again relatively clear. No acute skeletal abnormality. IMPRESSION: 1. Mildly improved aeration of the right lung with stable to slightly improved loculated pleural effusions along the lateral and inferior aspect of the right hemithorax. 2. Redemonstration of esophageal stent. Unchanged position of  right-sided chest tubes. Electronically Signed   By: Yvonne Kendall M.D.   On: 08/30/2022 08:37   DG Chest Port 1 View  Result Date: 08/29/2022 CLINICAL DATA:  Anastomotic leak following esophagectomy EXAM: PORTABLE CHEST 1 VIEW COMPARISON:  08/28/2022, CT 08/28/2022, radiograph 08/15/2022, 08/18/2022 FINDINGS: Right-sided central venous port tip at the cavoatrial region. Interim placement of a mediastinal/esophageal stent. Right-sided chest tube with tip at apex similar compared to prior. Suspect new right-sided chest tube which appears folded back upon itself in the apical region of the right lung with tip in the right lateral lower lung. Loculated pleural effusion/collections may be slightly increased. Poor aeration of right thorax. Persistent partial consolidation left lung base. IMPRESSION: 1. Slight increased loculated pleural collections/fluid on the right. Suspect additional right chest tube with tubing folded back upon itself at right apex, tip appears to be within the right lower lateral chest. 2. Interval placement of esophageal stent 3. Slightly improved aeration of left lung base but with residual partial consolidation. Electronically Signed   By: Donavan Foil M.D.   On: 08/29/2022 18:44   DG C-Arm 1-60 Min-No Report  Result Date:  08/29/2022 Fluoroscopy was utilized by the requesting physician.  No radiographic interpretation.   CT CHEST ABDOMEN PELVIS W CONTRAST  Result Date: 08/28/2022 CLINICAL DATA:  Status post esophagectomy. Evaluate for anastomotic leak/abscess in chest or abdomen. * Tracking Code: BO * EXAM: CT CHEST, ABDOMEN, AND PELVIS WITH CONTRAST TECHNIQUE: Multidetector CT imaging of the chest, abdomen and pelvis was performed following the standard protocol during bolus administration of intravenous contrast. RADIATION DOSE REDUCTION: This exam was performed according to the departmental dose-optimization program which includes automated exposure control, adjustment of the  mA and/or kV according to patient size and/or use of iterative reconstruction technique. CONTRAST:  109m OMNIPAQUE IOHEXOL 350 MG/ML SOLN COMPARISON:  Chest radiographs, most recent earlier today. Esophagram 08/22/2022. PET 07/21/2022. Preoperative CTs of 03/30/2022. FINDINGS: CT CHEST FINDINGS Cardiovascular: Right Port-A-Cath tip mid right atrium. Aortic atherosclerosis. Normal heart size, without pericardial effusion. Lad coronary artery calcification. No central pulmonary embolism, on this non-dedicated study. Mediastinum/Nodes: Calcified right-sided thyroid nodule has been characterized previously including on prior PET. Right paratracheal 1.0 cm node on 09/03 is newly enlarged, favored to be reactive. No hilar adenopathy. Small volume pneumomediastinum. Surgical changes of partial esophagectomy and gastric pull-through. At the level of the gastro/esophageal anastomosis, there is a right-sided defect which communicates with the right pleural space. Example 21/3. There is gas adjacent to more distal surgical sutures, at the site of gastric pull-through at the diaphragmatic hiatus, but no well-defined communication (43/3). Lungs/Pleura: Moderate, multifocal loculated right-sided hydropneumothorax. The largest gas and air pocket is identified anteriorly including at 10.1 x 7.1 cm on 61/5. Small simple appearing left-sided pleural effusion. A right chest tube terminates within the superior right pleural space posteriorly. Relatively diffuse, slightly lower lung predominant right-sided airspace disease is primarily felt to represent compressive atelectasis secondary to loculated pleural fluid. Dependent left lower lobe atelectasis. Musculoskeletal: No acute osseous abnormality. CT ABDOMEN PELVIS FINDINGS Hepatobiliary: Normal liver. Normal gallbladder, without biliary ductal dilatation. Pancreas: Normal, without mass or ductal dilatation. Spleen: Normal in size, without focal abnormality. Adrenals/Urinary Tract:  Normal adrenal glands. Normal kidneys, without hydronephrosis. Normal urinary bladder. Stomach/Bowel: Normal distal stomach. Left-sided colonic underdistention. Normal terminal ileum and appendix. Percutaneous jejunostomy tube. Otherwise normal small bowel. Vascular/Lymphatic: Aortic atherosclerosis. No abdominopelvic adenopathy. Reproductive: Normal prostate. Other: Right perihepatic small volume fluid and gas anteriorly, including on 50/3. No free air or fluid more caudally. Musculoskeletal: No acute osseous abnormality. IMPRESSION: 1. Status post partial esophagectomy and gastric pull-through. Defect at the site of the esophageal-gastric anastomosis, at the craniocaudal level of the carina. This communicates with the right pleural space. 2. Secondary moderate volume, multifocal right-sided loculated hydropneumothorax. Right-sided airspace disease, primarily felt to represent compressive atelectasis. 3. Perihepatic gas and fluid, favored to be extension from the right hemithorax. No more caudal extraluminal gas or fluid identified. 4. Small volume pneumomediastinum. 5. New high mediastinal adenopathy, favored to be reactive. Recommend attention on follow-up. 6. Small left pleural effusion and compressive atelectasis. 7. Coronary artery atherosclerosis. Aortic Atherosclerosis (ICD10-I70.0). These results will be called to the ordering clinician or representative by the Radiologist Assistant, and communication documented in the PACS or [employer] Electronically Signed   By: KAbigail MiyamotoM.D.   On: 08/28/2022 14:51   DG CHEST PORT 1 VIEW  Result Date: 08/28/2022 CLINICAL DATA:  History of esophagectomy EXAM: PORTABLE CHEST 1 VIEW COMPARISON:  08/26/2022 FINDINGS: Right Port-A-Cath tip at low SVC. Right-sided chest tube is unchanged in position. Mild cardiomegaly. No pneumothorax. Significantly worsened right-sided aeration, likely  due to a combination of pleural fluid and airspace disease. Mild increase  in left base opacity. No pneumothorax. IMPRESSION: Significantly worsened right-sided aeration, likely due to a combination of pleural fluid and airspace disease. Right-sided chest tube in place, without pneumothorax. Progressive left base airspace disease, likely atelectasis. Electronically Signed   By: Abigail Miyamoto M.D.   On: 08/28/2022 09:09   DG Chest Port 1 View  Result Date: 08/26/2022 CLINICAL DATA:  Status post esophagectomy EXAM: PORTABLE CHEST 1 VIEW COMPARISON:  08/23/2022 FINDINGS: Cardiomegaly. Right chest port catheter. Right-sided chest tube. Heterogeneous bibasilar airspace opacity increased compared to prior examination with probable small layering pleural effusions. No significant pneumothorax. IMPRESSION: 1. Right-sided chest tube. No significant pneumothorax. 2. Heterogeneous bibasilar airspace opacity increased compared to prior examination with probable small layering pleural effusions. 3. Cardiomegaly. Electronically Signed   By: Delanna Ahmadi M.D.   On: 08/26/2022 11:31   DG CHEST PORT 1 VIEW  Result Date: 08/23/2022 CLINICAL DATA:  Status post esophagectomy 08/18/2022 and chest tube removal EXAM: PORTABLE CHEST 1 VIEW COMPARISON:  Chest radiograph dated 08/21/2022 FINDINGS: Lines/tubes: Interval removal of 1 right-sided chest tube. An additional right-sided chest tube remains. Right chest wall port tip projects over the right atrium. Chest: Low lung volumes with persistent bibasilar atelectasis. Pleura: No definite pneumothorax. Trace right pleural effusion. Persistent pneumoperitoneum. Heart/mediastinum: Enlarged cardiomediastinal silhouette is likely projectional. Decreased pneumopericardium. Bones: No acute osseous abnormality. IMPRESSION: 1. Interval removal of 1 right-sided chest tube. An additional right-sided chest tube remains. 2. No definite pneumothorax.  Trace right pleural effusion. 3. Persistent pneumoperitoneum. 4. Persistent bibasilar atelectasis. Electronically  Signed   By: Darrin Nipper M.D.   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 EXAM: ESOPHAGUS/BARIUM SWALLOW/TABLET STUDY TECHNIQUE: Single contrast examination was performed using Omnipaque 300. This exam was performed by Candiss Norse, PA-C, and was supervised and interpreted by Nelson Chimes, MD. FLUOROSCOPY: Radiation Exposure Index (as provided by the fluoroscopic device): 1 minute 42 seconds 798.09 micro gray meter squared. COMPARISON:  None Available. FINDINGS: Swallowing: Appears normal. No vestibular penetration or aspiration seen. Pharynx: Unremarkable. Esophagus: Diffusely dilated esophagus. Post surgical changes of the distal esophagus and stomach. No leak noted in supine LPO, RPO and AP projections. Esophageal motility: Within normal limits for post operative state. Hiatal Hernia: None. Gastroesophageal reflux: None visualized. Ingested 13 mm barium tablet: Not given Other: None. IMPRESSION: Water soluble contrast esophagram without evidence of post operative leak. Electronically Signed   By: Nelson Chimes M.D.   On: 08/22/2022 10:11   DG Chest Port 1 View  Result Date: 08/21/2022 CLINICAL DATA:  Status post esophagectomy EXAM: PORTABLE CHEST 1 VIEW COMPARISON:  08/19/2022 FINDINGS: Gastric catheter is again noted in satisfactory position. Right sided chest wall port is seen. Two chest tubes are noted on the right stable in appearance from the prior exam. Decreasing free air in the upper abdomen is noted. Basilar atelectasis is noted on the left. No pneumothorax is seen. Stable pneumopericardium is noted as well. IMPRESSION: No evidence of pneumothorax. Decreasing free air in the abdomen. Mild left basilar atelectasis. Electronically Signed   By: Inez Catalina  M.D.   On: 08/21/2022 09:16   DG Chest 1 View  Result Date: 08/19/2022 CLINICAL DATA:  Status post  esophagectomy EXAM: CHEST  1 VIEW COMPARISON:  Chest radiograph from one day prior. FINDINGS: Enteric tube terminates in the distal body of the stomach. Right internal jugular central venous catheter terminates over the cavoatrial junction. Two right apical chest tubes are in place. Stable cardiomediastinal silhouette with top-normal heart size and with scattered mild left pericardiac pneumomediastinum. No pneumothorax. No pleural effusion. Low lung volumes with increased patchy bibasilar lung opacities, left greater than right. No pulmonary edema. Stable free air under the right hemidiaphragm. Stable mild subcutaneous emphysema in the lateral lower right chest wall. IMPRESSION: 1. No pneumothorax. Right apical chest tubes in place. 2. Low lung volumes with increased patchy bibasilar lung opacities, left greater than right, favor atelectasis. 3. Stable free air under the right hemidiaphragm, presumably due to recent surgery. 4. Stable mild left pericardiac pneumomediastinum. Electronically Signed   By: Ilona Sorrel M.D.   On: 08/19/2022 08:47   DG Chest Port 1 View  Result Date: 08/18/2022 CLINICAL DATA:  Prior esophagectomy EXAM: PORTABLE CHEST 1 VIEW COMPARISON:  Chest x-ray dated August 15, 2022 FINDINGS: Right chest wall port unchanged in position. Enteric tube partially seen coursing below the diaphragm. Two right-sided chest tubes. Cardiac and mediastinal contours within normal limits for AP technique. Pneumomediastinum pneumoperitoneum. Left-greater-than-right basilar opacities, likely due to atelectasis. No large pleural effusion evidence of pneumothorax. IMPRESSION: 1. Two right-sided chest tubes in place. No evidence of pneumothorax. 2. Pneumomediastinum and pneumoperitoneum, likely postsurgical. Electronically Signed   By: Yetta Glassman M.D.   On: 08/18/2022 16:00       I have independently  reviewed the above radiology studies  and reviewed the findings with the patient.   Recent Lab Findings: Lab Results  Component Value Date   WBC 9.2 09/14/2022   HGB 9.6 (L) 09/14/2022   HCT 28.5 (L) 09/14/2022   PLT 306 09/14/2022   GLUCOSE 150 (H) 09/14/2022   ALT 159 (H) 09/14/2022   AST 87 (H) 09/14/2022   NA 125 (L) 09/14/2022   K 4.4 09/14/2022   CL 91 (L) 09/14/2022   CREATININE 0.62 09/14/2022   BUN 22 09/14/2022   CO2 24 09/14/2022   INR 1.1 08/15/2022         Assessment / Plan:   61 year old male status post robotic assisted Ivor Lewis esophagectomy complicated by anastomotic leak due to severe protein malnutrition.  He is failing to thrive at home, and has had worsening fevers drainage.  He will be admitted to reassess his clinical status.  He will require CT's chest, broad-spectrum antibiotics and antifungals.  Will resume his tube feeds and check nutritional labs.      Lajuana Matte 10/12/2022 2:07 PM

## 2022-09-16 NOTE — Progress Notes (Signed)
Myron/PA aware of heart rate 120's since admission today

## 2022-09-16 NOTE — Progress Notes (Signed)
Pharmacy Antibiotic Note  [name] [name] is a 61 y.o. male admitted on 09/26/2022 with  fever s/p esophagectomy for esophageal cancer .  Pharmacy has been consulted for vancomycin and Zosyn dosing. He is s/p esophagectomy on 27/5 complicated by anastomotic leak with stent placed on 11/13.   He was to have 6 weeks of abx/antifungal from 11/16 >> 12/28, initially with IV Zosyn and fluconazole. This was changed to Augmentin and oral fluconazole on discharge per ID.   He is afebrile, WBC were normal on 11/29, and renal function was normal.  Plan: Vancomycin 1500 mg IV load then 1250 mg IV q12h Goal AUC 400-550, Expected AUC: 498.3, SCr used: 0.8 Zosyn 3.375 g IV q8h to be infused over 4 hours Monitor renal function, clinical progress, cultures/sensitivities F/U LOT and de-escalate as able Vancomycin levels as clinically indicated      Temp (24hrs), Avg:98.3 F (36.8 C), Min:98.1 F (36.7 C), Max:98.4 F (36.9 C)  Recent Labs  Lab 09/14/22 1505  WBC 9.2  CREATININE 0.62    Estimated Creatinine Clearance: 97.6 mL/min (by C-G formula based on SCr of 0.62 mg/dL).    No Known Allergies  Antimicrobials this admission: Vancomycin 12/1 >>  Zosyn 12/1 >>   Dose adjustments this admission:   Microbiology results:   Thank you for involving pharmacy in this patient's care.  Renold Genta, PharmD, BCPS Clinical Pharmacist Clinical phone for 09/25/2022 is [phone] 10/15/2022 1:37 PM

## 2022-09-17 LAB — CBC
HCT: 23.4 % — ABNORMAL LOW (ref 39.0–52.0)
Hemoglobin: 7.9 g/dL — ABNORMAL LOW (ref 13.0–17.0)
MCH: 32.1 pg (ref 26.0–34.0)
MCHC: 33.8 g/dL (ref 30.0–36.0)
MCV: 95.1 fL (ref 80.0–100.0)
Platelets: 251 10*3/uL (ref 150–400)
RBC: 2.46 MIL/uL — ABNORMAL LOW (ref 4.22–5.81)
RDW: 14 % (ref 11.5–15.5)
WBC: 4.4 10*3/uL (ref 4.0–10.5)
nRBC: 0 % (ref 0.0–0.2)

## 2022-09-17 LAB — GLUCOSE, CAPILLARY
Glucose-Capillary: 124 mg/dL — ABNORMAL HIGH (ref 70–99)
Glucose-Capillary: 147 mg/dL — ABNORMAL HIGH (ref 70–99)
Glucose-Capillary: 160 mg/dL — ABNORMAL HIGH (ref 70–99)
Glucose-Capillary: 170 mg/dL — ABNORMAL HIGH (ref 70–99)

## 2022-09-17 LAB — BASIC METABOLIC PANEL
Anion gap: 5 (ref 5–15)
BUN: 21 mg/dL (ref 8–23)
CO2: 25 mmol/L (ref 22–32)
Calcium: 8.5 mg/dL — ABNORMAL LOW (ref 8.9–10.3)
Chloride: 100 mmol/L (ref 98–111)
Creatinine, Ser: 0.7 mg/dL (ref 0.61–1.24)
GFR, Estimated: >60 mL/min (ref >60)
Glucose, Bld: 156 mg/dL — ABNORMAL HIGH (ref 70–99)
Potassium: 4.3 mmol/L (ref 3.5–5.1)
Sodium: 130 mmol/L — ABNORMAL LOW (ref 135–145)

## 2022-09-17 LAB — METHYLMALONIC ACID, SERUM: Methylmalonic Acid, Quantitative: 204 nmol/L (ref 0–378)

## 2022-09-17 MED ORDER — OSMOLITE 1.5 CAL PO LIQD
1000.0000 mL | ORAL | Status: AC
  Administered 2022-09-17: 1000 mL

## 2022-09-17 MED ORDER — PROSOURCE TF20 ENFIT COMPATIBL EN LIQD
60.0000 mL | Freq: Three times a day (TID) | ENTERAL | Status: DC
  Administered 2022-09-17 – 2022-09-22 (×11): 60 mL
  Filled 2022-09-17 (×11): qty 60

## 2022-09-17 NOTE — Progress Notes (Signed)
Mobility Specialist Progress Note:   09/17/22 1537  Mobility  Activity Refused mobility   Pt adamantly refused mobility d/t fatigue. Will f/u as able.    Andrey Campanile Mobility Specialist Please contact via SecureChat or  Rehab office at [phone]

## 2022-09-17 NOTE — Progress Notes (Signed)
Initial Nutrition Assessment  DOCUMENTATION CODES:   Not applicable  INTERVENTION:  - Continuous TF of Osmolite 1.5 @ 75 mL/hr over 24 hrs.  -Modify to TF20 Prosource TID.  - This will provide 2940 kcals, 172 gm protein and 1371 mL free water.  - Will hold off on adding FWF for now due to low sodium.   NUTRITION DIAGNOSIS:   Altered GI function related to inability to eat, dysphagia as evidenced by NPO status.  GOAL:   Patient will meet greater than or equal to 90% of their needs  MONITOR:   TF tolerance  REASON FOR ASSESSMENT:   Consult Enteral/tube feeding initiation and management  ASSESSMENT:   61 y.o. male admits related to SOB and generalized weakness. PMH includes esophageal cancer. Pt is currently receiving medical management for fevers and failure to thrive.  Meds reviewed. Labs reviewed: Na low.   MD consult for TF management. Pt with recent admission. TF have been already ordered at the rate that previous inpatient RD recommended. Spoke with RN who states that the TF were running at 90 mL/hr.   Spoke with pt's wife via phone who states that the pt was receiving 7 cartons of Osmolite 1.5 over 90 mL/hr for 18 hrs. She also states that she was giving the pt Prosource that she had ordered from [employer]. We discussed that this was okay but not necessary for pt to meet his caloric and protein requirements. RD gave the pt's wife RD office contact information if she had any questions.   Spoke with MD who states that the pt needs more calories and protein. Per record, pt has experienced a 9% wt loss over the past month. Discussed with MD about running TF over 24 hours vs. 18 hrs for now to provide pt with the upper limit of his calorie needs. RD will modify rate to 75 mL/hr over 24 hrs with addition of Prosource TID.   NUTRITION - FOCUSED PHYSICAL EXAM:  Unable to assess due to remote assessment, attempt at follow up.   Diet Order:   Diet Order             Diet NPO  time specified  Diet effective now                   EDUCATION NEEDS:   Not appropriate for education at this time  Skin:  Skin Assessment: Skin Integrity Issues: Skin Integrity Issues:: Incisions Incisions: right chest  Last BM:  12/1  Height:   Ht Readings from Last 1 Encounters:  10/06/2022 '5\' 10"'$  (1.778 m)    Weight:   Wt Readings from Last 1 Encounters:  10/11/2022 81.6 kg    Ideal Body Weight:     BMI:  There is no height or weight on file to calculate BMI.  Estimated Nutritional Needs:   Kcal:  8466-5993 kcals  Protein:  165-205 gm  Fluid:  >/= 2.4L  Thalia Bloodgood, RD, LDN, CNSC.

## 2022-09-17 NOTE — Plan of Care (Signed)
  Problem: Clinical Measurements: Goal: Respiratory complications will improve Outcome: Progressing Goal: Cardiovascular complication will be avoided Outcome: Progressing   Problem: Nutrition: Goal: Adequate nutrition will be maintained Outcome: Progressing   Problem: Activity: Goal: Risk for activity intolerance will decrease Outcome: Not Progressing   Problem: Pain Managment: Goal: General experience of comfort will improve Outcome: Not Progressing   Problem: Activity: Goal: Risk for activity intolerance will decrease Outcome: Not Progressing

## 2022-09-17 NOTE — Progress Notes (Signed)
     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  Anastomotic leak Concern for staple line perf close to pyloromyotomy Continue abx and antifungals Continue to improve nutrition Will plan for washout out on Monday  [place]

## 2022-09-18 LAB — COMPREHENSIVE METABOLIC PANEL
ALT: 121 U/L — ABNORMAL HIGH (ref 0–44)
AST: 61 U/L — ABNORMAL HIGH (ref 15–41)
Albumin: 1.6 g/dL — ABNORMAL LOW (ref 3.5–5.0)
Alkaline Phosphatase: 131 U/L — ABNORMAL HIGH (ref 38–126)
Anion gap: 9 (ref 5–15)
BUN: 19 mg/dL (ref 8–23)
CO2: 24 mmol/L (ref 22–32)
Calcium: 8.6 mg/dL — ABNORMAL LOW (ref 8.9–10.3)
Chloride: 97 mmol/L — ABNORMAL LOW (ref 98–111)
Creatinine, Ser: 0.7 mg/dL (ref 0.61–1.24)
GFR, Estimated: >60 mL/min (ref >60)
Glucose, Bld: 178 mg/dL — ABNORMAL HIGH (ref 70–99)
Potassium: 4.1 mmol/L (ref 3.5–5.1)
Sodium: 130 mmol/L — ABNORMAL LOW (ref 135–145)
Total Bilirubin: 0.3 mg/dL (ref 0.3–1.2)
Total Protein: 6.1 g/dL — ABNORMAL LOW (ref 6.5–8.1)

## 2022-09-18 LAB — GLUCOSE, CAPILLARY
Glucose-Capillary: 135 mg/dL — ABNORMAL HIGH (ref 70–99)
Glucose-Capillary: 169 mg/dL — ABNORMAL HIGH (ref 70–99)
Glucose-Capillary: 145 mg/dL — ABNORMAL HIGH (ref 70–99)
Glucose-Capillary: 161 mg/dL — ABNORMAL HIGH (ref 70–99)
Glucose-Capillary: 156 mg/dL — ABNORMAL HIGH (ref 70–99)
Glucose-Capillary: 155 mg/dL — ABNORMAL HIGH (ref 70–99)

## 2022-09-18 NOTE — Plan of Care (Signed)
  Problem: Education: Goal: Knowledge of General Education information will improve Description: Including pain rating scale, medication(s)/side effects and non-pharmacologic comfort measures Outcome: Progressing   Problem: Clinical Measurements: Goal: Respiratory complications will improve Outcome: Progressing   Problem: Activity: Goal: Risk for activity intolerance will decrease Outcome: Progressing   Problem: Pain Managment: Goal: General experience of comfort will improve Outcome: Progressing   Problem: Safety: Goal: Ability to remain free from injury will improve Outcome: Progressing

## 2022-09-18 NOTE — Progress Notes (Signed)
Myron/PA notified of more tachy heart rate since 11:00am than usual (now 130-140's), patient's temp 99.69F--rechecked temp at 12:35pm, now reading 100.32F oral, patient wants to wait on taking tylenol just yet, Myron/PA also advised of new temp and patient's request, I will recheck temp in about one hour to see if he wants intervention at that time, wife also in room

## 2022-09-18 NOTE — Progress Notes (Addendum)
      [address]       [place] [address]             [phone]         Subjective: Awake and alert, still having primarily right-side chest discomfort and has been coughing more frequently today.   T-spike to 103 early this morning, low-grade fever now.  Objective: Vital signs in last 24 hours: Temp:  [98.7 F (37.1 C)-103 F (39.4 C)] 100.5 F (38.1 C) (12/03 1235) Pulse Rate:  [92-197] 181 (12/03 1200) Cardiac Rhythm: Sinus tachycardia (12/03 0700) Resp:  [20-30] 28 (12/03 1200) BP: (100-122)/(60-70) 122/70 (12/03 1046) SpO2:  [93 %-100 %] 99 % (12/03 1235) Weight:  [80.5 kg] 80.5 kg (12/03 0351)    Intake/Output from previous day: 12/02 0701 - 12/03 0700 In: 3240.3 [I.V.:30; NG/GT:2525.5; IV Piggyback:684.8] Out: 1435 [Urine:1350; Drains:85] Intake/Output this shift: Total I/O In: 0  Out: 535 [Urine:500; Drains:35]  General appearance: alert, cooperative, and moderate distress Neurologic: intact Heart: S. Tach 130-150. Lungs: coarse breath sounds bilat. Had a total of 56m drainage from the JP's for 24 hours.  Abdomen: soft, no tenderness. The J tube site appears healthy. Tube feeding infusing at 735mhr.  Extremities: no peripheral edema  Lab Results: Recent Labs    10/02/2022 1412 09/17/22 0523  WBC 7.5 4.4  HGB 9.3* 7.9*  HCT 27.9* 23.4*  PLT 255 251   BMET:  Recent Labs    10/12/2022 1412 09/17/22 0523  NA 129* 130*  K 4.5 4.3  CL 98 100  CO2 21* 25  GLUCOSE 157* 156*  BUN 23 21  CREATININE 0.76 0.70  CALCIUM 9.0 8.5*    PT/INR: No results for input(s): "LABPROT", "INR" in the last 72 hours. 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 Planning exploration in the  OR tomorrow. Continue broad-spectrum ABX and anti-fungal therapy along with nutritional support.    LOS: 2 days    MyAntony OdeaPA-C 09/18/2022  Pm rounds Fever being controlled with scheduled tylenol dosing Patient understands he will have surgery tomorrow by Dr LiKipp Broodlood pressure 122/70, pulse 130, temperature 99.5 F (37.5 C), temperature source Oral, resp. rate (!) 28, weight 80.5 kg, SpO2 99 %.   patient examined and medical record reviewed,agree with above note. PeDahlia Byes2/12/2021

## 2022-09-19 ENCOUNTER — Other Ambulatory Visit: Payer: Self-pay

## 2022-09-19 ENCOUNTER — Encounter (HOSPITAL_COMMUNITY)
Admission: AD | Disposition: E | Payer: Self-pay | Source: Ambulatory Visit | Attending: Thoracic Surgery (Cardiothoracic Vascular Surgery)

## 2022-09-19 ENCOUNTER — Inpatient Hospital Stay (HOSPITAL_COMMUNITY): Payer: No Typology Code available for payment source

## 2022-09-19 ENCOUNTER — Inpatient Hospital Stay (HOSPITAL_COMMUNITY): Payer: No Typology Code available for payment source | Admitting: Anesthesiology

## 2022-09-19 ENCOUNTER — Inpatient Hospital Stay: Payer: No Typology Code available for payment source | Admitting: Licensed Clinical Social Worker

## 2022-09-19 ENCOUNTER — Encounter (HOSPITAL_COMMUNITY): Payer: Self-pay | Admitting: Thoracic Surgery (Cardiothoracic Vascular Surgery)

## 2022-09-19 DIAGNOSIS — D649 Anemia, unspecified: Secondary | ICD-10-CM | POA: Diagnosis not present

## 2022-09-19 DIAGNOSIS — J869 Pyothorax without fistula: Secondary | ICD-10-CM

## 2022-09-19 DIAGNOSIS — T82898A Other specified complication of vascular prosthetic devices, implants and grafts, initial encounter: Secondary | ICD-10-CM

## 2022-09-19 HISTORY — PX: ESOPHAGOGASTRODUODENOSCOPY: SHX5428

## 2022-09-19 HISTORY — PX: VIDEO ASSISTED THORACOSCOPY: SHX5073

## 2022-09-19 LAB — CBC
HCT: 24.5 % — ABNORMAL LOW (ref 39.0–52.0)
Hemoglobin: 8.1 g/dL — ABNORMAL LOW (ref 13.0–17.0)
MCH: 31.4 pg (ref 26.0–34.0)
MCHC: 33.1 g/dL (ref 30.0–36.0)
MCV: 95 fL (ref 80.0–100.0)
Platelets: 297 10*3/uL (ref 150–400)
RBC: 2.58 MIL/uL — ABNORMAL LOW (ref 4.22–5.81)
RDW: 14 % (ref 11.5–15.5)
WBC: 5 10*3/uL (ref 4.0–10.5)
nRBC: 0 % (ref 0.0–0.2)

## 2022-09-19 LAB — BASIC METABOLIC PANEL
Anion gap: 9 (ref 5–15)
BUN: 20 mg/dL (ref 8–23)
CO2: 25 mmol/L (ref 22–32)
Calcium: 8.8 mg/dL — ABNORMAL LOW (ref 8.9–10.3)
Chloride: 95 mmol/L — ABNORMAL LOW (ref 98–111)
Creatinine, Ser: 0.68 mg/dL (ref 0.61–1.24)
GFR, Estimated: >60 mL/min (ref >60)
Glucose, Bld: 123 mg/dL — ABNORMAL HIGH (ref 70–99)
Potassium: 3.8 mmol/L (ref 3.5–5.1)
Sodium: 129 mmol/L — ABNORMAL LOW (ref 135–145)

## 2022-09-19 LAB — GLUCOSE, CAPILLARY
Glucose-Capillary: 107 mg/dL — ABNORMAL HIGH (ref 70–99)
Glucose-Capillary: 148 mg/dL — ABNORMAL HIGH (ref 70–99)
Glucose-Capillary: 160 mg/dL — ABNORMAL HIGH (ref 70–99)
Glucose-Capillary: 206 mg/dL — ABNORMAL HIGH (ref 70–99)
Glucose-Capillary: 97 mg/dL (ref 70–99)

## 2022-09-19 LAB — SURGICAL PCR SCREEN
MRSA, PCR: NEGATIVE
Staphylococcus aureus: NEGATIVE

## 2022-09-19 SURGERY — VIDEO ASSISTED THORACOSCOPY
Anesthesia: General | Laterality: Right

## 2022-09-19 MED ORDER — PNEUMOCOCCAL 20-VAL CONJ VACC 0.5 ML IM SUSY: 0.5000 mL | PREFILLED_SYRINGE | INTRAMUSCULAR | Status: DC | PRN

## 2022-09-19 MED ORDER — FREE WATER: 200.0000 mL | Status: DC

## 2022-09-19 MED ORDER — PROPOFOL 10 MG/ML IV BOLUS
INTRAVENOUS | Status: AC
  Filled 2022-09-19: qty 20

## 2022-09-19 MED ORDER — ALBUMIN HUMAN 5 % IV SOLN: INTRAVENOUS | Status: DC | PRN

## 2022-09-19 MED ORDER — PHENYLEPHRINE 80 MCG/ML (10ML) SYRINGE FOR IV PUSH (FOR BLOOD PRESSURE SUPPORT)
PREFILLED_SYRINGE | INTRAVENOUS | Status: DC | PRN
  Administered 2022-09-19 (×2): 80 ug via INTRAVENOUS
  Administered 2022-09-19: 160 ug via INTRAVENOUS
  Administered 2022-09-19: 80 ug via INTRAVENOUS
  Administered 2022-09-19 (×2): 160 ug via INTRAVENOUS
  Administered 2022-09-19: 80 ug via INTRAVENOUS

## 2022-09-19 MED ORDER — MIDAZOLAM HCL 2 MG/2ML IJ SOLN: 0.5000 mg | Freq: Once | INTRAMUSCULAR | Status: DC | PRN

## 2022-09-19 MED ORDER — ONDANSETRON HCL 4 MG/2ML IJ SOLN
INTRAMUSCULAR | Status: AC
  Filled 2022-09-19: qty 2

## 2022-09-19 MED ORDER — OXYCODONE HCL 5 MG PO TABS: 5.0000 mg | ORAL_TABLET | Freq: Once | ORAL | Status: DC | PRN

## 2022-09-19 MED ORDER — FENTANYL CITRATE PF 50 MCG/ML IJ SOSY
25.0000 ug | PREFILLED_SYRINGE | INTRAMUSCULAR | Status: DC | PRN
  Filled 2022-09-19: qty 1

## 2022-09-19 MED ORDER — LIDOCAINE 2% (20 MG/ML) 5 ML SYRINGE
INTRAMUSCULAR | Status: DC | PRN
  Administered 2022-09-19: 40 mg via INTRAVENOUS

## 2022-09-19 MED ORDER — ACETAMINOPHEN 10 MG/ML IV SOLN
INTRAVENOUS | Status: DC | PRN
  Administered 2022-09-19: 1000 mg via INTRAVENOUS

## 2022-09-19 MED ORDER — PHENYLEPHRINE 80 MCG/ML (10ML) SYRINGE FOR IV PUSH (FOR BLOOD PRESSURE SUPPORT)
PREFILLED_SYRINGE | INTRAVENOUS | Status: AC
  Filled 2022-09-19: qty 10

## 2022-09-19 MED ORDER — BUPIVACAINE HCL 0.5 % IJ SOLN
INTRAMUSCULAR | Status: AC
  Filled 2022-09-19: qty 1

## 2022-09-19 MED ORDER — LACTATED RINGERS IV SOLN: INTRAVENOUS | Status: DC

## 2022-09-19 MED ORDER — BUPIVACAINE HCL (PF) 0.5 % IJ SOLN
INTRAMUSCULAR | Status: DC | PRN
  Administered 2022-09-19: 50 mL

## 2022-09-19 MED ORDER — HYDROMORPHONE HCL 1 MG/ML IJ SOLN: 0.2500 mg | INTRAMUSCULAR | Status: DC | PRN

## 2022-09-19 MED ORDER — IOHEXOL 300 MG/ML  SOLN
INTRAMUSCULAR | Status: DC | PRN
  Administered 2022-09-19: 40 mL

## 2022-09-19 MED ORDER — DEXAMETHASONE SODIUM PHOSPHATE 10 MG/ML IJ SOLN
INTRAMUSCULAR | Status: AC
  Filled 2022-09-19: qty 1

## 2022-09-19 MED ORDER — PROPOFOL 10 MG/ML IV BOLUS
INTRAVENOUS | Status: DC | PRN
  Administered 2022-09-19: 100 mg via INTRAVENOUS

## 2022-09-19 MED ORDER — ONDANSETRON HCL 4 MG/2ML IJ SOLN
INTRAMUSCULAR | Status: DC | PRN
  Administered 2022-09-19: 4 mg via INTRAVENOUS

## 2022-09-19 MED ORDER — MIDAZOLAM HCL 2 MG/2ML IJ SOLN
INTRAMUSCULAR | Status: AC
  Filled 2022-09-19: qty 2

## 2022-09-19 MED ORDER — OSMOLITE 1.5 CAL PO LIQD
1000.0000 mL | ORAL | Status: DC
  Administered 2022-09-19 – 2022-10-17 (×31): 1000 mL
  Filled 2022-09-19 (×2): qty 1000

## 2022-09-19 MED ORDER — MIDAZOLAM HCL 2 MG/2ML IJ SOLN
INTRAMUSCULAR | Status: DC | PRN
  Administered 2022-09-19: 2 mg via INTRAVENOUS

## 2022-09-19 MED ORDER — FENTANYL CITRATE (PF) 250 MCG/5ML IJ SOLN
INTRAMUSCULAR | Status: AC
  Filled 2022-09-19: qty 5

## 2022-09-19 MED ORDER — OXYCODONE HCL 5 MG/5ML PO SOLN: 5.0000 mg | Freq: Once | ORAL | Status: DC | PRN

## 2022-09-19 MED ORDER — ROCURONIUM BROMIDE 10 MG/ML (PF) SYRINGE
PREFILLED_SYRINGE | INTRAVENOUS | Status: DC | PRN
  Administered 2022-09-19: 60 mg via INTRAVENOUS
  Administered 2022-09-19: 30 mg via INTRAVENOUS

## 2022-09-19 MED ORDER — MEPERIDINE HCL 25 MG/ML IJ SOLN: 6.2500 mg | INTRAMUSCULAR | Status: DC | PRN

## 2022-09-19 MED ORDER — SODIUM CHLORIDE 0.9 % IV SOLN: INTRAVENOUS | Status: DC | PRN

## 2022-09-19 MED ORDER — ORAL CARE MOUTH RINSE: 15.0000 mL | Freq: Once | OROMUCOSAL | Status: AC

## 2022-09-19 MED ORDER — SUGAMMADEX SODIUM 200 MG/2ML IV SOLN
INTRAVENOUS | Status: DC | PRN
  Administered 2022-09-19: 200 mg via INTRAVENOUS

## 2022-09-19 MED ORDER — PROMETHAZINE HCL 25 MG/ML IJ SOLN: 6.2500 mg | INTRAMUSCULAR | Status: DC | PRN

## 2022-09-19 MED ORDER — CHLORHEXIDINE GLUCONATE 0.12 % MT SOLN
15.0000 mL | Freq: Once | OROMUCOSAL | Status: AC
  Administered 2022-09-19: 15 mL via OROMUCOSAL

## 2022-09-19 MED ORDER — DEXAMETHASONE SODIUM PHOSPHATE 10 MG/ML IJ SOLN
INTRAMUSCULAR | Status: DC | PRN
  Administered 2022-09-19: 4 mg via INTRAVENOUS

## 2022-09-19 MED ORDER — 0.9 % SODIUM CHLORIDE (POUR BTL) OPTIME
TOPICAL | Status: DC | PRN
  Administered 2022-09-19: 3000 mL

## 2022-09-19 MED ORDER — FENTANYL CITRATE (PF) 250 MCG/5ML IJ SOLN
INTRAMUSCULAR | Status: DC | PRN
  Administered 2022-09-19: 50 ug via INTRAVENOUS
  Administered 2022-09-19: 100 ug via INTRAVENOUS

## 2022-09-19 MED ORDER — PHENYLEPHRINE HCL-NACL 20-0.9 MG/250ML-% IV SOLN
INTRAVENOUS | Status: DC | PRN
  Administered 2022-09-19: 40 ug/min via INTRAVENOUS

## 2022-09-19 MED ORDER — INFLUENZA VAC SPLIT QUAD 0.5 ML IM SUSY: 0.5000 mL | PREFILLED_SYRINGE | INTRAMUSCULAR | Status: DC | PRN

## 2022-09-19 SURGICAL SUPPLY — 101 items
APPLICATOR TIP COSEAL (VASCULAR PRODUCTS) IMPLANT
APPLICATOR TIP EXT COSEAL (VASCULAR PRODUCTS) IMPLANT
BLADE CLIPPER SURG (BLADE) IMPLANT
BLADE SURG 11 STRL SS (BLADE) ×2 IMPLANT
BUTTON OLYMPUS DEFENDO 5 PIECE (MISCELLANEOUS) ×2 IMPLANT
CANISTER SUCT 3000ML PPV (MISCELLANEOUS) ×2 IMPLANT
CANNULA VESSEL 3MM 2 BLNT TIP (CANNULA) IMPLANT
CATH THORACIC 28FR (CATHETERS) IMPLANT
CATH THORACIC 36FR (CATHETERS) IMPLANT
CATH THORACIC 36FR RT ANG (CATHETERS) IMPLANT
CHLORAPREP W/TINT 26 (MISCELLANEOUS) ×2 IMPLANT
CLEANER TIP ELECTROSURG 2X2 (MISCELLANEOUS) IMPLANT
CNTNR URN SCR LID CUP LEK RST (MISCELLANEOUS) ×4 IMPLANT
CONN ST 1/4X3/8  BEN (MISCELLANEOUS)
CONN ST 1/4X3/8 BEN (MISCELLANEOUS) IMPLANT
CONN Y 3/8X3/8X3/8  BEN (MISCELLANEOUS)
CONN Y 3/8X3/8X3/8 BEN (MISCELLANEOUS) IMPLANT
CONT SPEC 4OZ STRL OR WHT (MISCELLANEOUS) ×4
COVER SURGICAL LIGHT HANDLE (MISCELLANEOUS) IMPLANT
DEFOGGER SCOPE WARMER CLEARIFY (MISCELLANEOUS) ×2 IMPLANT
DERMABOND ADVANCED .7 DNX12 (GAUZE/BANDAGES/DRESSINGS) ×2 IMPLANT
DISSECTOR BLUNT TIP ENDO 5MM (MISCELLANEOUS) IMPLANT
DRAIN CHANNEL 28F RND 3/8 FF (WOUND CARE) IMPLANT
DRAIN CONNECTOR BLAKE 1:1 (MISCELLANEOUS) IMPLANT
DRAPE C-ARM 42X120 X-RAY (DRAPES) IMPLANT
DRAPE CHEST BREAST 15X10 FENES (DRAPES) IMPLANT
ELECT BLADE 4.0 EZ CLEAN MEGAD (MISCELLANEOUS) ×2
ELECT REM PT RETURN 9FT ADLT (ELECTROSURGICAL) ×2
ELECTRODE BLDE 4.0 EZ CLN MEGD (MISCELLANEOUS) ×2 IMPLANT
ELECTRODE REM PT RTRN 9FT ADLT (ELECTROSURGICAL) ×2 IMPLANT
FORCEPS GRASP COMBO 8X230 (FORCEP) IMPLANT
GAUZE 4X4 16PLY ~~LOC~~+RFID DBL (SPONGE) ×2 IMPLANT
GAUZE SPONGE 4X4 12PLY STRL (GAUZE/BANDAGES/DRESSINGS) ×2 IMPLANT
GAUZE SPONGE 4X4 12PLY STRL LF (GAUZE/BANDAGES/DRESSINGS) IMPLANT
GLOVE BIO SURGEON STRL SZ7 (GLOVE) ×2 IMPLANT
GLOVE BIO SURGEON STRL SZ7.5 (GLOVE) ×4 IMPLANT
GOWN STRL REUS W/ TWL LRG LVL3 (GOWN DISPOSABLE) ×6 IMPLANT
GOWN STRL REUS W/ TWL XL LVL3 (GOWN DISPOSABLE) ×2 IMPLANT
GOWN STRL REUS W/TWL LRG LVL3 (GOWN DISPOSABLE) ×6
GOWN STRL REUS W/TWL XL LVL3 (GOWN DISPOSABLE) ×2
KIT BASIN OR (CUSTOM PROCEDURE TRAY) ×2 IMPLANT
KIT SUCTION CATH 14FR (SUCTIONS) ×2 IMPLANT
KIT TURNOVER KIT B (KITS) ×2 IMPLANT
MARKER SKIN DUAL TIP RULER LAB (MISCELLANEOUS) ×2 IMPLANT
NDL 18GX1X1/2 (RX/OR ONLY) (NEEDLE) ×2 IMPLANT
NEEDLE 18GX1X1/2 (RX/OR ONLY) (NEEDLE) ×2 IMPLANT
NS IRRIG 1000ML POUR BTL (IV SOLUTION) ×4 IMPLANT
OIL SILICONE PENTAX (PARTS (SERVICE/REPAIRS)) IMPLANT
PACK CHEST (CUSTOM PROCEDURE TRAY) ×2 IMPLANT
PACK UNIVERSAL I (CUSTOM PROCEDURE TRAY) ×2 IMPLANT
PAD ARMBOARD 7.5X6 YLW CONV (MISCELLANEOUS) ×4 IMPLANT
PASSER SUT SWANSON 36MM LOOP (INSTRUMENTS) IMPLANT
SCISSORS LAP 5X35 DISP (ENDOMECHANICALS) IMPLANT
SEALANT SURG COSEAL 4ML (VASCULAR PRODUCTS) IMPLANT
SEALANT SURG COSEAL 8ML (VASCULAR PRODUCTS) IMPLANT
SPONGE T-LAP 18X18 ~~LOC~~+RFID (SPONGE) ×8 IMPLANT
STOPCOCK 4 WAY LG BORE MALE ST (IV SETS) IMPLANT
SUT ETHILON 2 0 PSLX (SUTURE) IMPLANT
SUT PROLENE 3 0 SH DA (SUTURE) IMPLANT
SUT PROLENE 4 0 RB 1 (SUTURE)
SUT PROLENE 4-0 RB1 .5 CRCL 36 (SUTURE) IMPLANT
SUT SILK  1 MH (SUTURE) ×2
SUT SILK 1 MH (SUTURE) ×2 IMPLANT
SUT SILK 1 TIES 10X30 (SUTURE) IMPLANT
SUT SILK 2 0SH CR/8 30 (SUTURE) IMPLANT
SUT SILK 3 0SH CR/8 30 (SUTURE) IMPLANT
SUT VIC AB 1 CTX 18 (SUTURE) IMPLANT
SUT VIC AB 1 CTX 36 (SUTURE)
SUT VIC AB 1 CTX36XBRD ANBCTR (SUTURE) IMPLANT
SUT VIC AB 2-0 CT1 27 (SUTURE) ×2
SUT VIC AB 2-0 CT1 TAPERPNT 27 (SUTURE) ×2 IMPLANT
SUT VIC AB 2-0 CTX 36 (SUTURE) IMPLANT
SUT VIC AB 2-0 UR6 27 (SUTURE) IMPLANT
SUT VIC AB 3-0 SH 27 (SUTURE) ×4
SUT VIC AB 3-0 SH 27X BRD (SUTURE) ×2 IMPLANT
SUT VIC AB 3-0 X1 27 (SUTURE) IMPLANT
SUT VICRYL 0 UR6 27IN ABS (SUTURE) ×2 IMPLANT
SUT VICRYL 2 TP 1 (SUTURE) IMPLANT
SWAB COLLECTION DEVICE MRSA (MISCELLANEOUS) IMPLANT
SWAB CULTURE ESWAB REG 1ML (MISCELLANEOUS) IMPLANT
SYR 10ML LL (SYRINGE) IMPLANT
SYR 20ML ECCENTRIC (SYRINGE) ×2 IMPLANT
SYR 20ML LL LF (SYRINGE) ×2 IMPLANT
SYR 50ML LL SCALE MARK (SYRINGE) IMPLANT
SYSTEM SAHARA CHEST DRAIN ATS (WOUND CARE) ×2 IMPLANT
TAPE CLOTH 4X10 WHT NS (GAUZE/BANDAGES/DRESSINGS) ×2 IMPLANT
TAPE CLOTH SURG 4X10 WHT LF (GAUZE/BANDAGES/DRESSINGS) IMPLANT
TAPE UMBILICAL COTTON 1/8X30 (MISCELLANEOUS) ×2 IMPLANT
TIP APPLICATOR SPRAY EXTEND 16 (VASCULAR PRODUCTS) IMPLANT
TOWEL GREEN STERILE (TOWEL DISPOSABLE) ×4 IMPLANT
TOWEL GREEN STERILE FF (TOWEL DISPOSABLE) ×2 IMPLANT
TRAP SPECIMEN MUCUS 40CC (MISCELLANEOUS) IMPLANT
TRAY FOLEY SLVR 16FR LF STAT (SET/KITS/TRAYS/PACK) ×2 IMPLANT
TROCAR XCEL 12X100 BLDLESS (ENDOMECHANICALS) IMPLANT
TROCAR XCEL BLADELESS 5X75MML (TROCAR) IMPLANT
TUBE CONNECTING 20X1/4 (TUBING) ×2 IMPLANT
TUBING ENDO SMARTCAP (MISCELLANEOUS) ×2 IMPLANT
TUBING EXTENTION W/L.L. (IV SETS) IMPLANT
TUBING LAP HI FLOW INSUFFLATIO (TUBING) IMPLANT
UNDERPAD 30X36 HEAVY ABSORB (UNDERPADS AND DIAPERS) ×2 IMPLANT
WATER STERILE IRR 1000ML POUR (IV SOLUTION) ×2 IMPLANT

## 2022-09-19 NOTE — Progress Notes (Signed)
Pharmacy Antibiotic Note  [name] [name] is a 61 y.o. male admitted on 09/17/2022 with  fever s/p esophagectomy for esophageal cancer .  Pharmacy has been consulted for vancomycin and Zosyn dosing. He is s/p esophagectomy on 04/5 complicated by anastomotic leak with stent placed on 11/13.   He was to have 6 weeks of abx/antifungal from 11/16 >> 12/28, initially with IV Zosyn and fluconazole. This was changed to Augmentin and oral fluconazole on discharge per ID.   He is febrile with Tm 102, WBC are normal, and renal function is stable with good UOP.  Plan: Continue vancomycin 1250 mg IV q12h Goal AUC 400-550, Expected AUC: 498.3, SCr used: 0.8 Continue Zosyn 3.375 g IV q8h to be infused over 4 hours Monitor renal function, clinical progress, cultures/sensitivities F/U LOT and de-escalate as able Vancomycin levels as clinically indicated  Height: '5\' 10"'$  (177.8 cm) Weight: 80 kg (176 lb 5.9 oz) IBW/kg (Calculated) : 73  Temp (24hrs), Avg:99.5 F (37.5 C), Min:98.5 F (36.9 C), Max:102 F (38.9 C)  Recent Labs  Lab 09/14/22 1505 10/14/2022 1412 09/17/22 0523 09/18/22 1408 09/25/2022 0540  WBC 9.2 7.5 4.4  --  5.0  CREATININE 0.62 0.76 0.70 0.70 0.68     Estimated Creatinine Clearance: 97.6 mL/min (by C-G formula based on SCr of 0.68 mg/dL).    No Known Allergies  Antimicrobials this admission: Vancomycin 12/1 >>  Zosyn 12/1 >>  Fluconazole 12/1 >>  Dose adjustments this admission:   Microbiology results: 12/4 MRSA PCR neg 12/4 body fluid:   Thank you for involving pharmacy in this patient's care.  Renold Genta, PharmD, BCPS Clinical Pharmacist Clinical phone for 10/15/2022 is [phone] 10/06/2022 1:46 PM

## 2022-09-19 NOTE — Transfer of Care (Signed)
Immediate Anesthesia Transfer of Care Note  Patient: [name] [name]  Procedure(s) Performed: VIDEO ASSISTED THORACOSCOPY (Right) ESOPHAGOGASTRODUODENOSCOPY (EGD)  Patient Location: PACU  Anesthesia Type:General  Level of Consciousness: drowsy, patient cooperative, and responds to stimulation  Airway & Oxygen Therapy: Patient Spontanous Breathing  Post-op Assessment: Report given to RN and Post -op Vital signs reviewed and stable  Post vital signs: Reviewed and stable  Last Vitals:  Vitals Value Taken Time  BP 108/61 10/02/2022 1130  Temp    Pulse 118 09/24/2022 1132  Resp 31 10/10/2022 1132  SpO2 92 % 10/08/2022 1132  Vitals shown include unvalidated device data.  Last Pain:  Vitals:   10/11/2022 0842  TempSrc: Oral  PainSc: 4       Patients Stated Pain Goal: 0 (50/75/73 2256)  Complications: No notable events documented.

## 2022-09-19 NOTE — Op Note (Signed)
      [address]       [place] [address]             [phone]        09/20/2022  Patient:  [name] [name] Pre-Op Dx: Bernerd Limbo esophagectomy Anastomotic leak   Post-op Dx:  same Procedure: - Esophagogastroscopy - Repositioning of 125 mm covered esophageal stent placement - Esophagram - R VATS     Surgeon and Role:      * Nieves Barberi, Lucile Crater, MD - Primary Anesthesia  general EBL:  10 ml Blood Administration: none Specimen:  none     Counts: correct     Indications: 61yo male s/p Ivor Lewis esophagectomy developed an anastomotic leak that was previously treated with an esophageal stent.  He continue to have fevers, and evidence of leakage into the right chest.    He was taken to the OR for stent repositioning and wide drainage of his pleural space.   Findings: The stent was pulled back 2cm to the level of the clavicle.  No evidence of leak on the on-table angiogram.  Organized empyema in the right chest.     Operative Technique: After the risks, benefits and alternatives were thoroughly discussed, the patient was brought to the operative theatre.  Anesthesia was induced. The patient was prepped and draped in normal sterile fashion.  An appropriate surgical pause was performed, and pre-operative antibiotics were dosed accordingly.   The gastroscope was advanced through the oropharynx into the cervical esophagus under direct visualization.  The stent was open, and the stomach appeared healthy.  An on-table esophagram was performed which showed contrast passing around the proximal portion of the stent.  The stent was grasped, and pulled back 2cm.  Another esophagram was performed which showed good coverage.    The patient was then placed in a left lateral decubitus position, and prepped and draped in normal sterile fashion.  The previous access incision was opened.  The scope was advanced, and lung was mobilized off of the chest wall.  All  loculations were broken up, and the chest was copiously irrigated.  The lung was re-expanded.  The incision was then closed with several layers of absorbable suture.    The patient tolerated the procedure without any immediate complications, and was transferred to the PACU in stable condition. Tannis Burstein Bary Leriche

## 2022-09-19 NOTE — Plan of Care (Signed)
  Problem: Education: Goal: Knowledge of General Education information will improve Description: Including pain rating scale, medication(s)/side effects and non-pharmacologic comfort measures Outcome: Progressing   Problem: Health Behavior/Discharge Planning: Goal: Ability to manage health-related needs will improve Outcome: Progressing   Problem: Clinical Measurements: Goal: Respiratory complications will improve Outcome: Progressing   Problem: Nutrition: Goal: Adequate nutrition will be maintained Outcome: Progressing   Problem: Skin Integrity: Goal: Risk for impaired skin integrity will decrease Outcome: Progressing

## 2022-09-19 NOTE — Progress Notes (Signed)
No report received from [facility] prior to pt being sent for surgery.

## 2022-09-19 NOTE — Hospital Course (Addendum)
HPI: This is a 61 y.o. male is admitted from clinic t12/10/2021 with new onset fevers and purulent drainage from his chest tubes.  He has some worsening shortness of breath, and generalized weakness. Patient has undergone an esophagoscopy, robotic assisted laparoscopy, robotic assisted thoracoscopy, Ivor Lewis  Esophagectomy, pyloromyotomy, lap J tube, and intercostal nerve block by Dr. Kipp Brood on 08/18/2022. He then underwent an esophagogastroscopy, re positioning of esophageal stent, and right VATS on 08/29/2025 for an anastomotic esophageal leak. He was admitted for further treatment regarding fever, failure to thrive.    Hospital Course: He was continued on Zosyn and Diflucan. Tube feedings were resumed and nutrition recommendations were followed accordingly. He underwent an esophagogastroscopy, repositioning of esophageal stent, esophagram, and right thoracoscopy on 09/24/2022. H and H on 09/21/2022 was 7.5 and 24.2. He had dizziness and did not ambulate much. He was given a unit of blood. Right chest flushes were stopped on 12/06.  Pre albumin was checked on 12/08 and was up to 15. Vancomycin was added to antibiotic regimen as right pleural fluid showed E. Coli. PT and OT consults were obtained to assist with ambulation as patient severely deconditioned.Patient developed a fever to 101.3 on 12/08. CXR showed increase in right hydropneumothorax.  His chest tube was placed to 40 cm suction.  He had persistent hyponatremia, despite stopping scheduled free water. He also developed superficial dehiscence (about the size of a marble) of the skin edges from right lateral chest wound.  The patient developed leukocytosis with worsening of CXR. GI was consulted on 12/11. Barium swallow study that was ordered showed right greater than left contrast leak within the upper mediastinum. GI recommended upper GI endoscopy and this was done on 12/13.  Portion of the previously placed esophageal stent was removed. 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 Going distally, there was a 40 mm by 15 mm fistula found in the proximal esophagus and there was evidence of necrosis which was debrided. A 20 mm fistula was found just distal to the esophago-gastric anastomosis. GI and Dr. Kipp Brood had a discussion regarding next steps of management. It was decided to transfer to [facility] for possible endoscopic suturing. Lab results today showed H and H to be increased to 9.9 and 30.7, WBC 9,100, and sodium 131. Pre albumin was increased to 18 on 12/19. Right chest wound dehisced further. Wound was packed with normal saline wet to dry. Purulent drainage eventually stopped.  The patient developed nausea with vomiting in the evenings.  Scheduled Zofran was ordered and this did not help.  Adjustment was made to timing of supplements placed into his J Tube and this relieved his symptoms.  ID consult was obtained for IV antibiotics and they agree with continuing IV Zosyn and Diflucan.  The patient's tube feeds were adjusted to run 18 hrs. The patient tolerated these without significant difficulty.  The patient will require further treatment at [facility].  If transfer can be arranged this would be the preferred option.  If not possible we will arrange outpatient clinic follow up.  The patient will be ready for discharge once transfer vs. outpatient appointment can be arranged.  Dr. Donneta Romberg office documented a new referral and records were sent to the [facility] service on 10/20/2022.  Supportive care continued with tube feeding and antibiotics.  The right pleural drain was transitioned to a bulb collection system in favor of the Pleur-evac since it was draining less than 20 mL/day.  Mr. Mariea Clonts did develop some maceration of  the skin beneath the J-tube collar.  This was addressed with daily cleansing and application of a barrier cream. No purulent drainage was noted at the J Tube exit site. He continued to experience minor nausea and vomiting at  night, nightly Zofran was continued. His tube feeds were transitioned to 20 hrs due to fullness, nausea and vomiting. His nausea improved. He was felt medically stable for discharge but due to the complexity of his case and home health needs he remained an inpatient until [facility] outpatient follow up was arranged. His bulb drain dressing was saturated with brown drainage and the pocket around the exit site had increased in size. Wound care was consulted but stated they could not give any recommendations due to the tightness of the sutures on the J tube face plate. Sutures were unable to be removed due to risk of J tube dislodging. Dressing changes with Gerhardts barrier cream were continued. [facility] scheduled an outpatient appointment with Mr. Beaubrun for November 24, 2022. Per Dr. Riley Churches and Diflucan were discontinued. He became anemic and required 1 unit of packed red blood cells and his anemia improved. Occult blood test was ordered but did not result. Lovenox was held. Anemia panel was also ordered and showed low iron level. He was treated with IV Ferric gluconate. He began to experience nausea and vomiting again, he was started on Reglan and fiber supplement was decreased to once per day. His sinus tachycardia worsened, CTA was obtained and pulmonary embolism was ruled out. He developed an increasingly productive cough and fevers, he was restarted on empiric antibiotics. He developed respiratory distress with increasing cough and tachycardia. He was transferred to the ICU for further monitoring.  He spiked a temperature of 103 with worsening respiratory distress. He went into septic shock and ultimately required intubation. Bronchoscopy was performed and revealed bile in his RUL, RML, RLL bronchus. Cultures were obtained and his ABX regimen was broadened for aspiration pneumonia which included IV Diflucan, Vancomycin and Zosyn. Blood cultures were obtained. Critical care consult was obtained for  assistance with management. His propofol was switched to Precedex due to hypotension. He remained on Levophed 68mg/min and Vasopressin 0.03 on 11/07/22. He was started on stress dose steroids. He had an ileus, tube feeds were slowed to trickle feeds. Blood cultures began growing gram negative rods, Vancomycin was discontinued, Zosyn was switched to Meropenem and Diflucan was transitioned to Micafungin. He was started on Epinephrine for continued hypotension. He had an AKI and creatinine rose to 1.4. He became acidotic with a pH of 7.172 and lactic acid of 5.7 on 11/08/22. His hemodynamics and acidosis improved, Epinephrine and Vasopressin were weaned as hemodynamics tolerated. He converted to atrial fibrillation with RVR, he was given an IV Amiodarone bolus and started on Amiodarone gtt. Levophed was weaned as hemodynamics tolerated. Tube feeds were slowly increased. He converted to sinus tachycardia, amiodarone was discontinued. Critical care proceeded with left sided thoracentesis due to pleural effusion. 400cc of fluid was obtained but there was an unexpected aspiration of air during thoracentesis so chest tube was placed to suction. Creatinine began trending down. TPN was started to ensure nutrition until he could tolerate tube feeds and ileus had resolved. Stress steroids were discontinued. Ventilator support was weaned as tolerated. There was no air leak from the left chest tube and no well-defined pneumothorax. Left sided chest tube was removed without complication.  On 11/12/2022, he was noted to have increased work of breathing and also had required increase in  vasopressor support. The CT scan of the chest abdomen and pelvis was repeated to evaluate the cause of pneumothorax and rule out bowel obstruction. CT showed large-volume pneumoperitoneum in upper abdomen, increased body wall edema and mesenteric edema, possible ileus in colon, and moderate retained stool as well as worsening dense consolidation at  the left base. He remained afebrile. Nutrition support with TPN continued as well as IV antibiotics. Meropenem and Micafungin would be continued until November 25, 2022 per ID. He was started back on pressors for hemodynamic support. Milrinone was started and levophed was weaned as hemodynamics tolerated. Arterial line was placed for further monitoring. He passed a small amount of stool on 11/14/22 for the first time since 11/01/22. His anemia again worsened and he was transfused with packed red blood cells. He had no signs or symptoms of active bleeding. He had anasarca and was volume overload, he was aggressively diuresed. He was making stool regularly, tube feeds were restarted. He became comatose with brainstem reflexes intact, sedation was weaned off. Ventilator support was unable to be weaned due to the severity of lung disease, tracheostomy was to be considered. Dr. Kipp Brood and Dr. Tamala Julian discussed goals of care with Mr. Laplant's wife and she believed Mr. Brindle had enough suffering and would rather pass peacefully. No escalation in care would be made and he would be transitioned to comfort care the following week after his children had visited.

## 2022-09-19 NOTE — Progress Notes (Addendum)
      [address]       [place] [address]             [phone]           Subjective: Patient just waking up  Objective: Vital signs in last 24 hours: Temp:  [98.7 F (37.1 C)-102 F (38.9 C)] 102 F (38.9 C) (12/04 0345) Pulse Rate:  [24-197] 128 (12/04 0345) Cardiac Rhythm: Sinus tachycardia (12/04 0713) Resp:  [21-30] 26 (12/04 0345) BP: (98-128)/(58-76) 128/64 (12/04 0345) SpO2:  [92 %-100 %] 92 % (12/04 0345) Weight:  [80 kg] 80 kg (12/04 0215)     Intake/Output from previous day: 12/03 0701 - 12/04 0700 In: 2572.8 [NG/GT:1487.5; IV Piggyback:1025.3] Out: 8768 [Urine:1700; Drains:97]   Physical Exam:  Cardiovascular: Sinus tachycardia Pulmonary: Clear to auscultation on left and diminished on right base Abdomen: Soft, non tender, bowel sounds present. Extremities: Mild bilateral lower extremity edema. Wounds: Clean and dry.  No erythema or signs of infection. JP Drains: 1 with milky drainage and 1 with dark bloody drainage  Lab Results: CBC: Recent Labs    09/17/22 0523 10/11/2022 0540  WBC 4.4 5.0  HGB 7.9* 8.1*  HCT 23.4* 24.5*  PLT 251 297   BMET:  Recent Labs    09/18/22 1408 09/17/2022 0540  NA 130* 129*  K 4.1 3.8  CL 97* 95*  CO2 24 25  GLUCOSE 178* 123*  BUN 19 20  CREATININE 0.70 0.68  CALCIUM 8.6* 8.8*    PT/INR: No results for input(s): "LABPROT", "INR" in the last 72 hours. ABG:  INR: Will add last result for INR, ABG once components are confirmed Will add last 4 CBG results once components are confirmed  Assessment/Plan:  1. CV - Tachycardic. 2.  Pulmonary - On room air. JP drains:1 with milky drainage and 1 with dark bloody drainageEncourage incentive spirometer 3. CBGs 156/155/107.  No history of DM but on tube feedings. 4. GI-NPO. Tube feedings at 75 ml/hr but on hold as patient likely. having VATS today 5. Hyponatremia-Sodium this am 129. He has had hyponatremia since admission. Will discuss with  Dr. Kipp Brood if should give IVF 6. Anemia of chronic disease-H and H this am stable at 8.1 and 24.5 7. ID-on Zosyn and Diflucan for esophageal anastomotic leak 8. On Lovenox for DVT prophylaxis  Donielle M ZimmermanPA-C 09/17/2022,7:33 AM   Agree with above. OR today for EGD, and right thoracoscopy.  Jayonna Meyering Bary Leriche

## 2022-09-19 NOTE — Anesthesia Preprocedure Evaluation (Addendum)
Anesthesia Evaluation  Patient identified by MRN, date of birth, ID band Patient awake    Reviewed: Allergy & Precautions, NPO status , Patient's Chart, lab work & pertinent test results  History of Anesthesia Complications Negative for: history of anesthetic complications  Airway Mallampati: II  TM Distance: >3 FB Neck ROM: Full    Dental  (+) Dental Advisory Given   Pulmonary  R empyema   breath sounds clear to auscultation       Cardiovascular (-) angina  Rhythm:Regular Rate:Normal  07/2022 stress:  The study is normal. The study is low risk.   No ST deviation was noted.   LV perfusion is normal. There is no evidence of ischemia, no evidence of infarction.   Left ventricular function is normal. Nuclear stress EF: 60 %    Neuro/Psych negative neurological ROS     GI/Hepatic Elevated LFTs Esophageal cancer (GE junction): s/p esophagogastrectomy   Endo/Other  negative endocrine ROS    Renal/GU negative Renal ROS     Musculoskeletal   Abdominal   Peds  Hematology  (+) Blood dyscrasia (Hb 8.1), anemia   Anesthesia Other Findings   Reproductive/Obstetrics                             Anesthesia Physical Anesthesia Plan  ASA: 4  Anesthesia Plan: General   Post-op Pain Management: Tylenol PO (pre-op)*   Induction: Intravenous  PONV Risk Score and Plan: 2 and Ondansetron and Dexamethasone  Airway Management Planned: Oral ETT  Additional Equipment: ClearSight  Intra-op Plan:   Post-operative Plan: Extubation in OR  Informed Consent: I have reviewed the patients History and Physical, chart, labs and discussed the procedure including the risks, benefits and alternatives for the proposed anesthesia with the patient or authorized representative who has indicated his/her understanding and acceptance.     Dental advisory given  Plan Discussed with: CRNA and  Surgeon  Anesthesia Plan Comments:         Anesthesia Quick Evaluation

## 2022-09-19 NOTE — Anesthesia Postprocedure Evaluation (Addendum)
Anesthesia Post Note  Patient: [name] [name]  Procedure(s) Performed: VIDEO ASSISTED THORACOSCOPY (Right) ESOPHAGOGASTRODUODENOSCOPY (EGD)     Patient location during evaluation: PACU Anesthesia Type: General Level of consciousness: awake and alert, patient cooperative and oriented Pain management: pain level controlled Vital Signs Assessment: post-procedure vital signs reviewed and stable Respiratory status: spontaneous breathing, nonlabored ventilation, respiratory function stable and patient connected to nasal cannula oxygen Cardiovascular status: blood pressure returned to baseline and stable Postop Assessment: no apparent nausea or vomiting Anesthetic complications: no   No notable events documented.  Last Vitals:  Vitals:   10/07/2022 1200 09/30/2022 1215  BP: 100/60 102/62  Pulse: (!) 112 (!) 109  Resp: (!) 24 (!) 21  Temp:    SpO2: 96% 95%    Last Pain:  Vitals:   10/13/2022 1215  TempSrc:   PainSc: 0-No pain                 Chelci Wintermute,E. Kambre Messner

## 2022-09-19 NOTE — Anesthesia Procedure Notes (Signed)
Procedure Name: Intubation Date/Time: 10/07/2022 9:45 AM  Performed by: Janace Litten, CRNAPre-anesthesia Checklist: Patient identified, Emergency Drugs available, Suction available and Patient being monitored Patient Re-evaluated:Patient Re-evaluated prior to induction Oxygen Delivery Method: Circle System Utilized Preoxygenation: Pre-oxygenation with 100% oxygen Induction Type: IV induction Ventilation: Mask ventilation without difficulty Laryngoscope Size: Mac and 4 Grade View: Grade I Tube type: Oral Tube size: 7.5 mm Number of attempts: 1 Airway Equipment and Method: Stylet Placement Confirmation: ETT inserted through vocal cords under direct vision, positive ETCO2 and breath sounds checked- equal and bilateral Secured at: 23 cm Tube secured with: Tape Dental Injury: Teeth and Oropharynx as per pre-operative assessment

## 2022-09-20 ENCOUNTER — Encounter (HOSPITAL_COMMUNITY): Payer: Self-pay | Admitting: Thoracic Surgery (Cardiothoracic Vascular Surgery)

## 2022-09-20 LAB — GLUCOSE, CAPILLARY
Glucose-Capillary: 134 mg/dL — ABNORMAL HIGH (ref 70–99)
Glucose-Capillary: 143 mg/dL — ABNORMAL HIGH (ref 70–99)
Glucose-Capillary: 146 mg/dL — ABNORMAL HIGH (ref 70–99)
Glucose-Capillary: 146 mg/dL — ABNORMAL HIGH (ref 70–99)
Glucose-Capillary: 233 mg/dL — ABNORMAL HIGH (ref 70–99)
Glucose-Capillary: 234 mg/dL — ABNORMAL HIGH (ref 70–99)

## 2022-09-20 NOTE — Discharge Summary (Incomplete Revision)
[address]       [place] [address]             [phone]    Physician Discharge Summary  Patient ID: LUIAN SCHUMPERT MRN: [id number] DOB/AGE: 18-Jun-1961 61 y.o.  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   Esophageal anastomotic leak 09/26/2022   History of esophageal cancer 09/26/2022   Fever 10/16/2022   Empyema ([place]) 09/05/2022   Malnutrition of moderate degree 08/19/2022   Esophageal cancer ([place]) 08/18/2022   History of esophagectomy 08/18/2022   Chemotherapy induced neutropenia ([place]) 07/05/2022   Port-A-Cath in place 05/11/2022   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   Esophageal dysphagia 02/10/2022   History of colonic polyps 02/10/2022     Discharge Diagnoses:  Patient Active Problem List   Diagnosis Date Noted   Acute respiratory failure with hypoxia ([place]) 11/06/2022   Septic shock ([place]) 11/06/2022   Protein-calorie malnutrition, severe 09/30/2022   Fibrous mediastinitis 09/27/2022   Esophageal fistula 09/26/2022   Esophageal anastomotic leak 09/26/2022   History of esophageal cancer 09/26/2022   Fever 10/03/2022   Empyema ([place]) 09/05/2022   Malnutrition of moderate degree 08/19/2022   Esophageal cancer ([place]) 08/18/2022   History of esophagectomy 08/18/2022   Chemotherapy induced neutropenia ([place]) 07/05/2022   Port-A-Cath in place 05/11/2022   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 male is admitted from clinic t12/10/2021 with new onset fevers and purulent drainage from his chest tubes.  He has some worsening  shortness of breath, and generalized weakness. Patient has undergone an esophagoscopy, robotic assisted laparoscopy, robotic assisted thoracoscopy, Ivor Lewis  Esophagectomy, pyloromyotomy, lap J tube, and intercostal nerve block by Dr. Kipp Brood on 08/18/2022. He then underwent an esophagogastroscopy, re positioning of esophageal stent, and right VATS on 08/29/2025 for an anastomotic esophageal leak. He was admitted for further treatment regarding fever, failure to thrive.    Hospital Course: He was continued on Zosyn and Diflucan. Tube feedings were resumed and nutrition recommendations were followed accordingly. He underwent an esophagogastroscopy, repositioning of esophageal stent, esophagram, and right thoracoscopy on 09/29/2022. H and H on 09/21/2022 was 7.5 and 24.2. He had dizziness and did not ambulate much. He was given a unit of blood. Right chest flushes were stopped on 12/06.  Pre albumin was checked on 12/08 and was up to 15. Vancomycin was added to antibiotic regimen as right pleural fluid showed E. Coli. PT and OT consults were obtained to assist with ambulation as patient severely deconditioned.Patient developed a fever to 101.3 on 12/08. CXR showed increase in right hydropneumothorax.  His chest tube was placed to 40 cm suction.  He had persistent hyponatremia, despite stopping scheduled free water. He also developed superficial dehiscence (about the size of a marble) of the skin edges from right lateral chest wound.  The patient developed leukocytosis with worsening of CXR. GI was consulted on 12/11. Barium swallow study that was ordered showed right greater than left contrast leak within the upper mediastinum. GI recommended upper GI endoscopy and this was done on 12/13.  Portion of the previously  placed esophageal stent was removed. There was a small fistula found. Going distally, there was a 40 mm by 15 mm fistula found in the proximal esophagus and there was evidence of necrosis which was  debrided. A 20 mm fistula was found just distal to the esophago-gastric anastomosis. GI and Dr. Kipp Brood had a discussion regarding next steps of management. It was decided to transfer to [facility] for possible endoscopic suturing. Lab results today showed H and H to be increased to 9.9 and 30.7, WBC 9,100, and sodium 131. Pre albumin was increased to 18 on 12/19. Right chest wound dehisced further. Wound was packed with normal saline wet to dry. Purulent drainage eventually stopped.  The patient developed nausea with vomiting in the evenings.  Scheduled Zofran was ordered and this did not help.  Adjustment was made to timing of supplements placed into his J Tube and this relieved his symptoms.  ID consult was obtained for IV antibiotics and they agree with continuing IV Zosyn and Diflucan.  The patient's tube feeds were adjusted to run 18 hrs. The patient tolerated these without significant difficulty.  The patient will require further treatment at [facility].  If transfer can be arranged this would be the preferred option.  If not possible we will arrange outpatient clinic follow up.  The patient will be ready for discharge once transfer vs. outpatient appointment can be arranged.  Dr. Donneta Romberg office documented a new referral and records were sent to the [facility] service on 10/20/2022.  Supportive care continued with tube feeding and antibiotics.  The right pleural drain was transitioned to a bulb collection system in favor of the Pleur-evac since it was draining less than 20 mL/day.  Mr. Mariea Clonts did develop some maceration of the skin beneath the J-tube collar.  This was addressed with daily cleansing and application of a barrier cream. No purulent drainage was noted at the J Tube exit site. He continued to experience minor nausea and vomiting at night, nightly Zofran was continued. His tube feeds were transitioned to 20 hrs due to fullness, nausea and vomiting. His nausea improved. He was felt  medically stable for discharge but due to the complexity of his case and home health needs he remained an inpatient until [facility] outpatient follow up was arranged. His bulb drain dressing was saturated with brown drainage and the pocket around the exit site had increased in size. Wound care was consulted but stated they could not give any recommendations due to the tightness of the sutures on the J tube face plate. Sutures were unable to be removed due to risk of J tube dislodging. Dressing changes with Gerhardts barrier cream were continued. [facility] scheduled an outpatient appointment with Mr. Cashman for November 24, 2022. Per Dr. Riley Churches and Diflucan were discontinued. He became anemic and required 1 unit of packed red blood cells and his anemia improved. Occult blood test was ordered but did not result. Lovenox was held. Anemia panel was also ordered and showed low iron level. He was treated with IV Ferric gluconate. He began to experience nausea and vomiting again, he was started on Reglan and fiber supplement was decreased to once per day. His sinus tachycardia worsened, CTA was obtained and pulmonary embolism was ruled out. He developed an increasingly productive cough and fevers, he was restarted on empiric antibiotics. He developed respiratory distress with increasing cough and tachycardia. He was transferred to the ICU for further monitoring.  He spiked a temperature of 103 with  worsening respiratory distress. He went into septic shock and ultimately required intubation. Bronchoscopy was performed and revealed bile in his RUL, RML, RLL bronchus. Cultures were obtained and his ABX regimen was broadened for aspiration pneumonia which included IV Diflucan, Vancomycin and Zosyn. Blood cultures were obtained. Critical care consult was obtained for assistance with management. His propofol was switched to Precedex due to hypotension. He remained on Levophed 5mg/min and Vasopressin 0.03 on 11/07/22.  He was started on stress dose steroids. He had an ileus, tube feeds were slowed to trickle feeds. Blood cultures began growing gram negative rods, Vancomycin was discontinued, Zosyn was switched to Meropenem and Diflucan was transitioned to Micafungin. He was started on Epinephrine for continued hypotension. He had an AKI and creatinine rose to 1.4. He became acidotic with a pH of 7.172 and lactic acid of 5.7 on 11/08/22. His hemodynamics and acidosis improved, Epinephrine and Vasopressin were weaned as hemodynamics tolerated. He converted to atrial fibrillation with RVR, he was given an IV Amiodarone bolus and started on Amiodarone gtt. Levophed was weaned as hemodynamics tolerated. Tube feeds were slowly increased. He converted to sinus tachycardia, amiodarone was discontinued. Critical care proceeded with left sided thoracentesis due to pleural effusion. 400cc of fluid was obtained but there was an unexpected aspiration of air during thoracentesis so chest tube was placed to suction. Creatinine began trending down. TPN was started to ensure nutrition until he could tolerate tube feeds and ileus had resolved. Stress steroids were discontinued. Ventilator support was weaned as tolerated. There was no air leak from the left chest tube and no well-defined pneumothorax. Left sided chest tube was removed without complication.  On 11/12/2022, he was noted to have increased work of breathing and also had required increase in vasopressor support. The CT scan of the chest abdomen and pelvis was repeated to evaluate the cause of pneumothorax and rule out bowel obstruction. CT showed large-volume pneumoperitoneum in upper abdomen, increased body wall edema and mesenteric edema, possible ileus in colon, and moderate retained stool as well as worsening dense consolidation at the left base. He remained afebrile. Nutrition support with TPN continued as well as IV antibiotics. Meropenem and Micafungin would be continued until  November 25, 2022 per ID. He was started back on pressors for hemodynamic support. Milrinone was started and levophed was weaned as hemodynamics tolerated. Arterial line was placed for further monitoring. He passed a small amount of stool on 11/14/22 for the first time since 11/01/22. His anemia again worsened and he was transfused with packed red blood cells. He had no signs or symptoms of active bleeding. He had anasarca and was volume overload, he was aggressively diuresed. He was making stool regularly, tube feeds were restarted. He became comatose with brainstem reflexes intact, sedation was weaned off. Ventilator support was unable to be weaned due to the severity of lung disease, tracheostomy was to be considered. Dr. LKipp Broodand Dr. STamala Juliandiscussed goals of care with Mr. Shuffler's wife and she believed Mr. RHofmanhad enough suffering and would rather pass peacefully. No escalation in care would be made and he would be transitioned to comfort care the following week after his children had visited.   Consults: None  Treatments: surgery:   Esophagogastroscopy - Repositioning of 125 mm covered esophageal stent placement - Esophagram -R VATS by Dr. LKipp Broodon 10/04/2022  Upper GI Endoscopy on 10/11/2022    [facility] HospitalPatient Name: DKenaz OlafsonProcedure Date : 09/23/2022 MRN: [id number] MD: GJustice Britain,  MD, [id number] Date of Birth: 1961-08-24 CSN: [id number] Age: 21 Admit Type: Inpatient Procedure:                Upper GI endoscopy Indications:              Therapeutic procedure, Abnormal UGI series Providers:                Justice Britain, MD, Fanny Skates RN, RN, Benay Pillow, RN, William Dalton, Technician Referring MD:             Lajuana Matte Medicines:                General Anesthesia, Zosyn 7.939 g IV Complications:            No immediate complications. Estimated Blood Loss:     Estimated blood loss  was minimal. Procedure:                Pre-Anesthesia Assessment:                           - Prior to the procedure, a History and Physical                            was performed, and patient medications and                            allergies were reviewed. The patient's tolerance of                            previous anesthesia was also reviewed. The risks                            and benefits of the procedure and the sedation                            options and risks were discussed with the patient.                            All questions were answered, and informed consent                            was obtained. Prior Anticoagulants: The patient has                            taken no anticoagulant or antiplatelet agents. ASA                            Grade Assessment: III - A patient with severe                            systemic disease. After reviewing the risks and                            benefits, the patient was  deemed in satisfactory                            condition to undergo the procedure.                           After obtaining informed consent, the endoscope was                            passed under direct vision. Throughout the                            procedure, the patient's blood pressure, pulse, and                            oxygen saturations were monitored continuously. The                            [id number] ([id number]) Olympus endoscope was                            introduced through the mouth, and advanced to the                            second part of duodenum. The upper GI endoscopy was                            accomplished without difficulty. The patient                            tolerated the procedure. Scope In: Scope Out: Findings:      An esophageal stent was found in the esophagus (proximal aspect at about       23 cm). There was some concern that the proximal aspect of the stent was       not covering a potential defect  in the left area. Stent removal was       accomplished with a Raptor grasping device.      It was clear after this, that there was a smaller 18 mm fistula found       form 20-22 cm.      Going distally, a greater than 40 mm by 15 mm fistula was found in the       proximal esophagus found from 26-30 cm into the anastomosis. This had       evidence of necrosis and this was cleaned out with       washing/suctioning/debridement with raptor forceps. I was able to       visualize the chest tube in the right region.      An esophago-gastric anastomosis was found at 26-28 cm (within the area       noted above).      A 20 mm fistula was found just distal to this area of the anastomosis       approximately 34 cm from the incisors.      Patchy moderately erythematous mucosa without bleeding was found in the       entire examined stomach.      No gross  lesions were noted in the duodenal bulb, in the first portion       of the duodenum and in the second portion of the duodenum.      At the end of the procedure, I replaced an esophageal fully covered       stent after using a 0.035 inch x 450 cm straight Hydra Jagwire was       attempted. This passed successfully. This was stented with a 23 mm x       12.5 cm WallFlex covered stent under fluoroscopic guidance (15 cm stent       would likely still be too short but could be considered for upsizing). Impression:               - Pre-existing esophageal stent, removed.                           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                           - Larger fistula just above and including the                            anastomosis that is 4 cm by 1.5 cm in size. This                            had been covered by the previous stent however.                           - An esophago-gastric anastomosis was found.                           - Below the  anastomosis is a potential third                            fistulous connection.                           - Erythematous mucosa in the stomach.                           - No gross lesions in the duodenal bulb, in the                            first portion of the duodenum and in the second                            portion of the duodenum.                           - At end of procedure, esophageal prosthesis placed. Recommendation:           - The patient will be observed post-procedure,  until all discharge criteria are met.                           - Return patient to hospital ward for observation.                           - NPO.                           - Observe patient's clinical course.                           - WIll need further discussions with Thoracic                            Surgery in regards to possible other interventions                            that could be required or the potential of                            endoscopic suturing or endoscopic sponge/vaccuum                            therapy. These interventions will likely need to be                            considered at a quaternary center. Consideration of                            OVESCO clipping the proximal fistulous connection                            and the distal connection while still dealing with                            the larger middle fistulous connection may be                            possible, but not clear this will solve his larger                            issue unfortunately, since biggest disease is in                            the right mediastinum.                           - The findings and recommendations were discussed                            with the patient.                           - The findings and recommendations were discussed  with the referring physician. Procedure Code(s):        --- Professional  ---                           [phone], Esophagogastroduodenoscopy, flexible,                            transoral; with placement of endoscopic stent                            (includes pre- and post-dilation and guide wire                            passage, when performed)                           [id number], Esophagogastroduodenoscopy, flexible,                            transoral; with removal of foreign body(s) Diagnosis Code(s):        --- Professional ---                           Z97.8, Presence of other specified devices                           K22.89, Other specified disease of esophagus                           Z98.890, Other specified postprocedural states                           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 All rights reserved. The codes documented in this report are preliminary and upon coder review may  be revised to meet current compliance requirements. Justice Britain, MD 10/10/2022 4:29:47 PM Number of Addenda: 0    Significant Diagnostic Studies:  Narrative & Impression  CLINICAL DATA:  Fever with empyema.   EXAM: CT CHEST WITH CONTRAST   TECHNIQUE: Multidetector CT imaging of the chest was performed during intravenous contrast administration.   RADIATION DOSE REDUCTION: This exam was performed according to the departmental dose-optimization program which includes automated exposure control, adjustment of the mA and/or kV according to patient size and/or use of iterative reconstruction technique.   CONTRAST:  3m OMNIPAQUE IOHEXOL 350 MG/ML SOLN   COMPARISON:  Most recent portable chest 09/04/2022, portable chest 09/01/2022, chest, abdomen and pelvis CT with contrast 08/28/2022 and PET-CT  07/21/2022.   FINDINGS: Cardiovascular: The cardiac size is normal.  Calcification noted left main and proximal LAD coronary artery. No significant pericardial effusion. There is mild aortic atherosclerosis. The great vessels are clear.   There is no aortic aneurysm, stenosis or dissection. The pulmonary trunk is upper-normal caliber without evidence of central embolus. The pulmonary veins are decompressed.   Right chest port with IJ approach catheter is redemonstrated with the tip about the superior cavoatrial junction.   Mediastinum/Nodes: There are right and left paratracheal lymph nodes both measuring 1 cm in short axis, stable. There is a similar sized subcarinal mediastinal node. No hilar, axillary or supraclavicular adenopathy is seen. There is no new or worsening adenopathy.   Stable densely rim calcified 2 cm nodule right lobe of thyroid gland. This underwent ultrasound-guided biopsy 02/17/2022.   Esophageal stent again noted from T1-2 through T9-10 with surgical changes redemonstrated of mid to distal esophagectomy and gastric pull-through, with a staple line extending inferiorly.   An open defect in the EG anastomosis is again noted at the level of the carina in communication with pleural space on series 3 axial 47-59, and the defect appears larger than previously with minimal adjacent pneumomediastinum.   Previously there was prevascular pneumomediastinum which has resolved in the interval.   There is a fluid level in the esophageal stent up to the level of the top of the aortic arch.   There is suspected a second defect in the pull-through to the right along the staple line at the level of the esophageal hiatus on 3:106 and also is in communication with the pleural space and was not seen previously.   The thoracic trachea, main airways appear patent.   Lungs/Pleura: A right chest tube is again noted coursing across the posteroinferior pleural space then  cephalad in the paraspinal area with the tip in the posterior right apex, unchanged.   The second chest tube, seen on the prior chest x-rays but not on November 12 CT, enters the lateral lower right seventh intercostal space coursing anteriorly and cephalad then over the apex and inferiorly again, with the tip in the posterior lower right pleural space within a portion of a sizable loculated right chest effusion/hemothorax or empyema.   There is mild thickening and enhancement of the pleural margins alongside the fluid. There are scattered air pockets in the fluid and moderate-density debris which could be clotted blood at least in part.   The collection extends from the lateral right apical area inferiorly to the lateral chest base as well as anteriorly with only a mild improvement in the overall volume since the placement of a second chest tube.   An air-fluid pocket anteriorly is smaller than previously, on 3:64 measuring 8.1 x 5.1 cm, was previously 10.1 x 7.1 cm.   There is adjacent compressive atelectasis in the right upper lobe. In the right lower lobe base there is patchy consolidation or atelectasis which is less confluent than previously.   There previously was a small layering left pleural effusion which has cleared in the interval. Mild posterior atelectasis in the left lung is seen without further opacity.   Upper Abdomen: No acute abnormality.  Mild hepatic steatosis.   Musculoskeletal: No chest wall abnormality. No acute or significant osseous findings. There is mild dextroscoliosis and degenerative change of the spine.   IMPRESSION: 1. Sizable loculated right chest hydropneumothorax/hemothorax or empyema. There is mild thickening and enhancement of the pleural margins alongside the fluid and moderate debris with scattered air pockets in the fluid. Two chest tubes are in  place with only a mild improvement in the overall volume of the collection since  November 12. 2. Air-fluid pocket anteriorly in the right chest is smaller than previously, 8.1 x 5.1 cm, previously 10.1 x 7.1 cm. 3. Again noted defect in the EG anastomosis at the carinal level communicating with the pleural space and appears larger than on November 12, with a second smaller defect suspected to the right along the staple line at the level of the esophageal hiatus, and also appears to communicate with pleural space. 4. Fluid level in the esophageal stent up to the level of the top of the aortic arch. Aspiration precautions recommended. 5. Aortic and coronary artery atherosclerosis. 6. Stable slightly prominent mediastinal lymph nodes. 7. Interval resolution of previous CT finding of prevascular pneumomediastinum. 8. Interval resolution of the prior perihepatic pneumoperitoneum. 9. Patchy consolidation or atelectasis in the right lower lobe base, less confluent than previously. 10. Mild hepatic steatosis. 11. Stable densely rim calcified 2 cm right thyroid nodule. This underwent ultrasound-guided biopsy 02/17/2022.     Electronically Signed   By: Telford Nab M.D.   On: 09/26/2022 20:47   Narrative & Impression  CLINICAL DATA:  Patient history of Ivor Lewis esophagectomy on August 18, 2022 EGD. 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 Patient presents for single water-soluble contrast esophagram for further evaluation an anastomic leak   EXAM: ESOPHAGUS/BARIUM SWALLOW/TABLET STUDY   TECHNIQUE: Single contrast examination was performed using Omnipague 300. This exam was performed by Rushie Nyhan NP, and was supervised and interpreted by Dr. Abigail Miyamoto.   FLUOROSCOPY: Radiation Exposure Index (as provided by the fluoroscopic device): 9.0 mGy Kerma   COMPARISON:  Esophagram dated September 01, 2022 and CT chest abdomen pelvis dated August 28, 2022.    FINDINGS: Focused, single-contrast exam performed with patient supine and in various obliquities. Initial swallows demonstrated the esophageal stent originates at the level of the clavicles. There is immediate contrast outpouching on the right side of the esophagus or gastric pull-through at the level of the carina including on image 37 of series 1. More subtle area of left-sided contrast outpouching also identified.   At the level of the distal portion of the stent, contrast surrounds the stent and extends tracks minimally cephalad.   IMPRESSION: Right greater than left contrast leak within the upper mediastinum, within either the esophagus or gastric pull-through.   Contrast surrounding the distal stent is most likely within the gastric pull-through. No contrast extravasation at this site.     Electronically Signed   By: Abigail Miyamoto M.D.   On: 09/26/2022 16:20       Discharge Exam: Blood pressure 114/84, pulse 78, temperature 98 F (36.7 C), temperature source Axillary, resp. rate (!) 29, height 5' 10"  (1.778 m), weight 83 kg, SpO2 97 %.  Cardiovascular: RRR Pulmonary: Clear to auscultation on left and diminished on right base Abdomen: Soft, non tender, bowel sounds present. Extremities: No LE edema Wounds: J tube site clean and dry. Chest tube wound mostly clean and dry. Right lateral wound with purulent drainage distally with superficial dehiscence. Dressing is fairly dry this am (has not been changed since last shift) as opposed to previous days it was saturated. Right chest tube and JP drain: Chest tube to suction    Discharge Medications: Discharge Instructions     [facility] pharmacist to adjust dose for Vancomycin, Aminoglycosides and other anti-infective  therapies as requested by physician.   Complete by: As directed    [facility] to provide Cath Flo 46m   Complete by: As directed    Administer for PICC line occlusion and as ordered  by physician for other access device issues.   Anaphylaxis Kit: Provided to treat any anaphylactic reaction to the medication being provided to the patient if First Dose or when requested by physician   Complete by: As directed    Epinephrine 149mml vial / amp: Administer 0.52m69m0.52ml35mubcutaneously once for moderate to severe anaphylaxis, nurse to call physician and pharmacy when reaction occurs and call 911 if needed for immediate care   Diphenhydramine 50mg61mIV vial: Administer 25-50mg 47mM PRN for first dose reaction, rash, itching, mild reaction, nurse to call physician and pharmacy when reaction occurs   Sodium Chloride 0.9% NS 500ml I7mdminister if needed for hypovolemic blood pressure drop or as ordered by physician after call to physician with anaphylactic reaction   Change dressing on IV access line weekly and PRN   Complete by: As directed    Flush IV access with Sodium Chloride 0.9% and Heparin 10 units/ml or 100 units/ml   Complete by: As directed    Home infusion instructions - [facility]   Complete by: As directed    Instructions: Flush IV access with Sodium Chloride 0.9% and Heparin 10units/ml or 100units/ml   Change dressing on IV access line: Weekly and PRN   Instructions Cath Flo 2mg: Ad28mister for PICC Line occlusion and as ordered by physician for other access device   [facility] pharmacist to adjust dose for: Vancomycin, Aminoglycosides and other anti-infective therapies as requested by physician   Method of administration may be changed at the discretion of home infusion pharmacist based upon assessment of the patient and/or caregiver's ability to self-administer the medication ordered   Complete by: As directed       Allergies as of 11/18/2022   No Known Allergies   Med Rec must be completed [facility]   Discharge Care Instructions  (From admission, onward)           Start     Ordered   10/14/22  0000  Change dressing on IV access line weekly and PRN  (Home infusion instructions - [facility] )        10/14/22 1228            Follow-up Information     [facility].   Specialty: Cardiothoracic Surgery Contact information: [address] [place] [address] [phone]          Signed:  Kiernan Atkerson CMagdalene River/11/2022, 1:53 PM

## 2022-09-20 NOTE — Progress Notes (Signed)
   09/27/2022 2300  Provider Notification  Provider Name/Title Dr. Roxan Hockey  Date Provider Notified 10/01/2022  Time Provider Notified 2300  Method of Notification Page  Notification Reason Critical Result (gram stain cultures positive results)  Provider response No new orders  Date of Provider Response  (no response)  Time of Provider Response  (no response)

## 2022-09-20 NOTE — Progress Notes (Signed)
Mobility Specialist Progress Note    09/20/22 1124  Mobility  Activity Refused mobility   Pt stated "there's not a chance". Pt concerned about moving chest tube and did not agree to the solutions of carrying it or hanging it on a RW. Will f/u as schedule permits.   Hildred Alamin Mobility Specialist  Please Psychologist, sport and exercise or Rehab Office at [phone]

## 2022-09-20 NOTE — Progress Notes (Addendum)
      [address]       [place] [address]             [phone]       1 Day Post-Op Procedure(s) (LRB): VIDEO ASSISTED THORACOSCOPY (Right) ESOPHAGOGASTRODUODENOSCOPY (EGD) (N/A)  Subjective: Patient sleeping and awakened.   Objective: Vital signs in last 24 hours: Temp:  [98.4 F (36.9 C)-100.1 F (37.8 C)] 98.5 F (36.9 C) (12/05 0344) Pulse Rate:  [80-127] 95 (12/05 0344) Cardiac Rhythm: Normal sinus rhythm (12/04 1900) Resp:  [15-29] 22 (12/05 0344) BP: (91-131)/(50-74) 108/64 (12/05 0344) SpO2:  [92 %-98 %] 93 % (12/05 0344)     Intake/Output from previous day: 12/04 0701 - 12/05 0700 In: 1750 [I.V.:1000; IV Piggyback:750] Out: 130 [Chest Tube:130]   Physical Exam:  Cardiovascular: RRR Pulmonary: Clear to auscultation on left and diminished on right base Abdomen: Soft, non tender, bowel sounds present. Extremities: Mild bilateral lower extremity edema. Wounds: Clean and dry.  No erythema or signs of infection. JP Drains: 1 with milky drainage and 1 with dark bloody drainage  Lab Results: CBC: Recent Labs    09/21/2022 0540  WBC 5.0  HGB 8.1*  HCT 24.5*  PLT 297    BMET:  Recent Labs    09/18/22 1408 10/16/2022 0540  NA 130* 129*  K 4.1 3.8  CL 97* 95*  CO2 24 25  GLUCOSE 178* 123*  BUN 19 20  CREATININE 0.70 0.68  CALCIUM 8.6* 8.8*     PT/INR: No results for input(s): "LABPROT", "INR" in the last 72 hours. ABG:  INR: Will add last result for INR, ABG once components are confirmed Will add last 4 CBG results once components are confirmed  Assessment/Plan:  1. CV - SR 2.  Pulmonary - On room air. Chest tube with 130 cc recorded for 12 hours. Pleura VAC at 850 but that is with flushes. Encourage incentive spirometer 3. CBGs 160/206/233.  No history of DM but on tube feedings. 4. GI-NPO. Tube feedings at 75 ml/hr 5. Hyponatremia-Last sodium 129. He has had hyponatremia since admission.  6. Anemia of chronic  disease-H and H this am stable at 8.1 and 24.5 7. ID-on Zosyn and Diflucan for esophageal anastomotic leak. Gram stain from right pleural fluid shows few gram negative rods and gram positive cocci. Await culture 8. On Lovenox for DVT prophylaxis 9. Regarding pain control, has Tylenol and Hycet via tube. Fentanyl PRN. If has further pain, can consider Toradol  [name] M ZimmermanPA-C 09/20/2022,6:55 AM   Feels better today Continue irrigation today Continue tubefeeds Continue abx  [name]

## 2022-09-20 NOTE — Progress Notes (Signed)
Received critical results for gram stain cultures. On call cardiothoracic MD paged. No response was given.  Pt care ongoing

## 2022-09-20 NOTE — TOC CM/SW Note (Signed)
Recent discharge to home with wife , [facility] and tube feedings through [facility].   Notified Amy of [facility] and Pam with [facility] of patient's admission.    Transition of Care Department [facility]) will continue to monitor patient advancement through interdisciplinary progression rounds.

## 2022-09-21 ENCOUNTER — Inpatient Hospital Stay (HOSPITAL_COMMUNITY): Payer: No Typology Code available for payment source

## 2022-09-21 LAB — BASIC METABOLIC PANEL
Anion gap: 8 (ref 5–15)
BUN: 26 mg/dL — ABNORMAL HIGH (ref 8–23)
CO2: 24 mmol/L (ref 22–32)
Calcium: 8.3 mg/dL — ABNORMAL LOW (ref 8.9–10.3)
Chloride: 101 mmol/L (ref 98–111)
Creatinine, Ser: 0.62 mg/dL (ref 0.61–1.24)
GFR, Estimated: >60 mL/min (ref >60)
Glucose, Bld: 185 mg/dL — ABNORMAL HIGH (ref 70–99)
Potassium: 3.3 mmol/L — ABNORMAL LOW (ref 3.5–5.1)
Sodium: 133 mmol/L — ABNORMAL LOW (ref 135–145)

## 2022-09-21 LAB — GLUCOSE, CAPILLARY
Glucose-Capillary: 132 mg/dL — ABNORMAL HIGH (ref 70–99)
Glucose-Capillary: 149 mg/dL — ABNORMAL HIGH (ref 70–99)
Glucose-Capillary: 164 mg/dL — ABNORMAL HIGH (ref 70–99)
Glucose-Capillary: 167 mg/dL — ABNORMAL HIGH (ref 70–99)
Glucose-Capillary: 173 mg/dL — ABNORMAL HIGH (ref 70–99)
Glucose-Capillary: 176 mg/dL — ABNORMAL HIGH (ref 70–99)

## 2022-09-21 LAB — CBC
HCT: 24.2 % — ABNORMAL LOW (ref 39.0–52.0)
Hemoglobin: 7.5 g/dL — ABNORMAL LOW (ref 13.0–17.0)
MCH: 30.6 pg (ref 26.0–34.0)
MCHC: 31 g/dL (ref 30.0–36.0)
MCV: 98.8 fL (ref 80.0–100.0)
Platelets: 325 10*3/uL (ref 150–400)
RBC: 2.45 MIL/uL — ABNORMAL LOW (ref 4.22–5.81)
RDW: 14.1 % (ref 11.5–15.5)
WBC: 5.3 10*3/uL (ref 4.0–10.5)
nRBC: 0 % (ref 0.0–0.2)

## 2022-09-21 LAB — ACID FAST SMEAR (AFB, MYCOBACTERIA): Acid Fast Smear: NEGATIVE

## 2022-09-21 MED ORDER — POTASSIUM CHLORIDE 10 MEQ/100ML IV SOLN
10.0000 meq | INTRAVENOUS | Status: AC
  Administered 2022-09-21 (×4): 10 meq via INTRAVENOUS
  Filled 2022-09-21 (×4): qty 100

## 2022-09-21 NOTE — Progress Notes (Addendum)
      [address]       [place] [address]             [phone]       2 Days Post-Op Procedure(s) (LRB): VIDEO ASSISTED THORACOSCOPY (Right) ESOPHAGOGASTRODUODENOSCOPY (EGD) (N/A)  Subjective: Patient just waking up. He had a good day yesterday, for the most part.  Objective: Vital signs in last 24 hours: Temp:  [98 F (36.7 C)-98.5 F (36.9 C)] 98.3 F (36.8 C) (12/06 0347) Pulse Rate:  [73-91] 85 (12/06 0347) Cardiac Rhythm: Normal sinus rhythm (12/05 1905) Resp:  [17-23] 20 (12/06 0347) BP: (104-114)/(66-75) 104/75 (12/06 0347) SpO2:  [93 %-98 %] 96 % (12/05 2357) Weight:  [80.3 kg-81.3 kg] 80.3 kg (12/06 0347)     Intake/Output from previous day: 12/05 0701 - 12/06 0700 In: 1542.6 [IV Piggyback:1542.6] Out: 2260 [Urine:1200; Chest Tube:1060]   Physical Exam:  Cardiovascular: RRR Pulmonary: Clear to auscultation on left and diminished on right base Abdomen: Soft, non tender, bowel sounds present. Extremities: No LE edema Wounds: Clean and dry.  No erythema or signs of infection.   Lab Results: CBC: Recent Labs    10/13/2022 0540 09/21/22 0358  WBC 5.0 5.3  HGB 8.1* 7.5*  HCT 24.5* 24.2*  PLT 297 325    BMET:  Recent Labs    10/16/2022 0540 09/21/22 0358  NA 129* 133*  K 3.8 3.3*  CL 95* 101  CO2 25 24  GLUCOSE 123* 185*  BUN 20 26*  CREATININE 0.68 0.62  CALCIUM 8.8* 8.3*     PT/INR: No results for input(s): "LABPROT", "INR" in the last 72 hours. ABG:  INR: Will add last result for INR, ABG once components are confirmed Will add last 4 CBG results once components are confirmed  Assessment/Plan:  1. CV - SR 2.  Pulmonary - On room air. Chest tube output with 1060 cc recorded for 24 hours (with flushes).CXR this am appears to show right pleural effusion .Encourage incentive spirometer 3. CBGs 134/146/173.  No history of DM but on tube feedings. 4. GI-NPO. Tube feedings at 75 ml/hr 5. Mild hyponatremia-Sodium 133  this am. He has had hyponatremia since admission.  6. Anemia of chronic disease-H and H this am 7.5 and 24.2 7. ID-on Zosyn and Diflucan for esophageal anastomotic leak. Remains afebrile. WBC this am 5300. Gram stain from right pleural fluid shows few gram negative rods and gram positive cocci. Culture results show abundant gram negative rods;await sensitivities 8. On Lovenox for DVT prophylaxis 9. Regarding pain control, has Tylenol and Hycet via tube. Fentanyl PRN. If has further pain, can consider Toradol 10. Supplement potassium 11. Hopefully, flushes for right drain will stop today so that patient can ambulate more  [name] M ZimmermanPA-C 09/21/2022,6:57 AM   Agree with above Will stop irrigation Continue abx Ambulation, IS  [name] [name]

## 2022-09-21 NOTE — Progress Notes (Signed)
Mobility Specialist Progress Note    09/21/22 1047  Mobility  Activity Transferred to/from [facility];Ambulated with assistance in room  Level of Assistance Contact guard assist, steadying assist  Assistive Device Front wheel walker  Distance Ambulated (ft) 4 ft  Activity Response Tolerated fair  Mobility Referral Yes  $Mobility charge 1 Mobility   During Mobility: 135 HR Post-Mobility: 103 HR, 105/80 (89) BP  Pt received in bed requesting to use BR. C/o lightheadedness when standing from bed and from [facility]. Had liquid BM. Left in chair with call bell in reach.   Hildred Alamin Mobility Specialist  Please Psychologist, sport and exercise or Rehab Office at [phone]

## 2022-09-21 NOTE — Progress Notes (Signed)
Mobility Specialist Progress Note    09/21/22 1218  Mobility  Activity Ambulated with assistance in room  Level of Assistance Minimal assist, patient does 75% or more  Assistive Device Front wheel walker  Distance Ambulated (ft) 4 ft  Activity Response Tolerated well  Mobility Referral Yes  $Mobility charge 1 Mobility   Post-Mobility: 98 HR  Pt received requesting to transition back to bed. C/o pain, RN notified. Left with call bell in reach and son present.   Hildred Alamin Mobility Specialist  Please Psychologist, sport and exercise or Rehab Office at [phone]

## 2022-09-22 LAB — GLUCOSE, CAPILLARY
Glucose-Capillary: 149 mg/dL — ABNORMAL HIGH (ref 70–99)
Glucose-Capillary: 171 mg/dL — ABNORMAL HIGH (ref 70–99)
Glucose-Capillary: 132 mg/dL — ABNORMAL HIGH (ref 70–99)
Glucose-Capillary: 160 mg/dL — ABNORMAL HIGH (ref 70–99)
Glucose-Capillary: 147 mg/dL — ABNORMAL HIGH (ref 70–99)

## 2022-09-22 LAB — COPPER, SERUM: Copper: 145 ug/dL — ABNORMAL HIGH (ref 69–132)

## 2022-09-22 LAB — PREPARE RBC (CROSSMATCH)

## 2022-09-22 MED ORDER — FREE WATER
150.0000 mL | Status: DC
  Administered 2022-09-22 – 2022-09-26 (×22): 150 mL

## 2022-09-22 MED ORDER — PROSOURCE TF20 ENFIT COMPATIBL EN LIQD
60.0000 mL | Freq: Every day | ENTERAL | Status: DC
  Administered 2022-09-23 – 2022-10-05 (×9): 60 mL
  Filled 2022-09-22 (×9): qty 60

## 2022-09-22 NOTE — Progress Notes (Signed)
Pharmacy Antibiotic Note  [name] [name] is a 61 y.o. male admitted on 10/05/2022 with  fever s/p esophagectomy for esophageal cancer .  Pharmacy has been consulted for vancomycin and Zosyn dosing. He is s/p esophagectomy on 63/0 complicated by anastomotic leak with stent placed on 11/13.   He was to have 6 weeks of abx/antifungal from 11/16 >> 12/28, initially with IV Zosyn and fluconazole. This was changed to Augmentin and oral fluconazole on discharge per ID (outpt course may need to change with new culture data).  Afebrile, WBC improved Pleural Cx with E coli - Zosyn S, Unasyn R  Plan: Continue vancomycin 1250 mg IV q12h - added stop date to complete 7 days Goal AUC 400-550, Expected AUC: 498.3, SCr used: 0.8  Continue Zosyn 3.375 g IV q8h to be infused over 4 hours Monitor renal function, clinical progress, cultures/sensitivities  Continue Fluconazole 800 mg iv Q 24 hours   Height: '5\' 10"'$  (177.8 cm) Weight: 80 kg (176 lb 5.9 oz) IBW/kg (Calculated) : 73  Temp (24hrs), Avg:98.2 F (36.8 C), Min:98.1 F (36.7 C), Max:98.5 F (36.9 C)  Recent Labs  Lab 10/10/2022 1412 09/17/22 0523 09/18/22 1408 09/29/2022 0540 09/21/22 0358  WBC 7.5 4.4  --  5.0 5.3  CREATININE 0.76 0.70 0.70 0.68 0.62     Estimated Creatinine Clearance: 97.6 mL/min (by C-G formula based on SCr of 0.62 mg/dL).    No Known Allergies  Antimicrobials this admission: Vancomycin 12/1 >> 7 days Zosyn 12/1 >>  Fluconazole 12/1 >>  Dose adjustments this admission:   Microbiology results: 12/4 MRSA PCR neg 12/4 body fluid: E Coli - Zosyn S  Thank you for involving pharmacy in this patient's care. Anette Guarneri, PharmD 09/22/2022 8:16 AM

## 2022-09-22 NOTE — Progress Notes (Addendum)
      [name]       [name] [address]             [phone]       3 Days Post-Op Procedure(s) (LRB): VIDEO ASSISTED THORACOSCOPY (Right) ESOPHAGOGASTRODUODENOSCOPY (EGD) (N/A)  Subjective: Patient just waking up. He sat in the chair yesterday for 2 hours. Did not ambulate but a few feet, had dizziness. He had loose stool earlier this am  Objective: Vital signs in last 24 hours: Temp:  [98.1 F (36.7 C)-98.5 F (36.9 C)] 98.5 F (36.9 C) (12/07 0349) Pulse Rate:  [88-104] 104 (12/07 0349) Cardiac Rhythm: Normal sinus rhythm (12/06 1900) Resp:  [19-20] 20 (12/07 0402) BP: (101-112)/(69-75) 111/72 (12/07 0349) SpO2:  [95 %-99 %] 95 % (12/07 0349) Weight:  [80 kg] 80 kg (12/07 0515)     Intake/Output from previous day: 12/06 0701 - 12/07 0700 In: 4426.8 [NG/GT:3188.8; IV Piggyback:1158] Out: 3299 [Urine:1150; Drains:25; Chest Tube:160]   Physical Exam:  Cardiovascular: RRR Pulmonary: Clear to auscultation on left and diminished on right base Abdomen: Soft, non tender, bowel sounds present. Extremities: No LE edema Wounds: Clean and dry.  No erythema or signs of infection. Right chest tube and JP drain: Chest tube to water seal.  Lab Results: CBC: Recent Labs    09/21/22 0358  WBC 5.3  HGB 7.5*  HCT 24.2*  PLT 325    BMET:  Recent Labs    09/21/22 0358  NA 133*  K 3.3*  CL 101  CO2 24  GLUCOSE 185*  BUN 26*  CREATININE 0.62  CALCIUM 8.3*     PT/INR: No results for input(s): "LABPROT", "INR" in the last 72 hours. ABG:  INR: Will add last result for INR, ABG once components are confirmed Will add last 4 CBG results once components are confirmed  Assessment/Plan:  1. CV - SR 2.  Pulmonary - On room air. Chest tube output with 160 cc recorded for 24 hours Pleura VAC at 124 cc this am). Light milky like color from chest tube. JP drain with scant output this am. Encourage incentive spirometer 3. CBGs 149/132/149.  No  history of DM but on tube feedings. 4. GI-NPO. Tube feedings at 75 ml/hr. Imodium PRN diarrhea 5. Mild hyponatremia-Last sodium 133 . He has had hyponatremia since admission.  6. Anemia of chronic disease-Last H and H  7.5 and 24.2 7. ID-on Zosyn and Diflucan for esophageal anastomotic leak. Remains afebrile. Last WBC 5300. Gram stain from right pleural fluid shows few gram negative rods and gram positive cocci. Culture results show E. Coli. Continue Zosyn as is sensitive 8. On Lovenox for DVT prophylaxis 9. Deconditioned-PT/OT  [name] M ZimmermanPA-C 09/22/2022,7:00 AM  Agree with above Low CT output Will transfuse 1 unit of pRBCs PT/OT  [name] [name]

## 2022-09-22 NOTE — Progress Notes (Addendum)
Nutrition Follow-up  DOCUMENTATION CODES:   Not applicable  INTERVENTION:   Tube Feeding via J-tube: Continue Osmolite 1.5 at 75 ml/hr Reduce Pro-Source TF20 60 mL to daily This provides 2780 kcals, 133 g of protein and 1368 mL of free water  Monitor hydration status, pt currently only receiving 1.4 L of fluid from TF in addition to flushes with meds and tube maintenance. No additional free water ordered at this time. Discussed with Dr. Kipp Brood and ok to start free water flushes for hydration at this time. Plan to start with 150 mL q 4 hours for now (TF plus current FWF provides 2268 mL of free water)   NUTRITION DIAGNOSIS:   Altered GI function related to inability to eat, dysphagia as evidenced by NPO status.  Being addressed via TF  GOAL:   Patient will meet greater than or equal to 90% of their needs  Met via TF   MONITOR:   TF tolerance  REASON FOR ASSESSMENT:   Consult Enteral/tube feeding initiation and management  ASSESSMENT:   61 y.o. male admits related to SOB and generalized weakness. PMH includes esophageal cancer. Pt is currently receiving medical management for fevers and failure to thrive.  12/04 Esophagram, Repositioning of 125 mm covered esophageal stent placement, R VATS  Remains NPO Tolerating Osmolite 1.5 at 75 ml/hr  via J-tube with Pro-Source TF20 60 mL TID  +loose stool, imodium started prn  Noted PT/OT consulted today; pt very deconditioned  Chest tube with 160 mL mL, light milky color drainage  Labs: reviewed, last sodium 133 (no BMP today) Meds: KCl, zosyn, vancomycin, protonix, imodium, diflucan   Diet Order:   Diet Order             Diet NPO time specified  Diet effective now                   EDUCATION NEEDS:   Not appropriate for education at this time  Skin:  Skin Assessment: Skin Integrity Issues: Skin Integrity Issues:: Incisions Incisions: right chest  Last BM:  12/7  Height:   Ht Readings from Last 1  Encounters:  10/06/2022 _0  (1.778 m)    Weight:   Wt Readings from Last 1 Encounters:  09/22/22 80 kg    Ideal Body Weight:     BMI:  Body mass index is 25.31 kg/m.  Estimated Nutritional Needs:   Kcal:  5830-9407 kcals  Protein:  165-205 gm  Fluid:  >/= 2.4L  Kerman Passey MS, RDN, LDN, CNSC Registered Dietitian 3 Clinical Nutrition RD Pager and On-Call Pager Number Located in [place]

## 2022-09-22 NOTE — Progress Notes (Signed)
Mobility Specialist Progress Note    09/22/22 1557  Mobility  Activity Contraindicated/medical hold   Attempted to see pt x1 in am and pt requested to wait until after blood transfusion. Attempted x2 in pm and pt still receiving blood and sleeping. Will f/u as schedule permits.   Hildred Alamin Mobility Specialist  Please Psychologist, sport and exercise or Rehab Office at [phone]

## 2022-09-23 ENCOUNTER — Inpatient Hospital Stay (HOSPITAL_COMMUNITY): Payer: No Typology Code available for payment source

## 2022-09-23 LAB — BPAM RBC
Blood Product Expiration Date: 202312122359
ISSUE DATE / TIME: 202312071256
Unit Type and Rh: 9500

## 2022-09-23 LAB — GLUCOSE, CAPILLARY
Glucose-Capillary: 137 mg/dL — ABNORMAL HIGH (ref 70–99)
Glucose-Capillary: 142 mg/dL — ABNORMAL HIGH (ref 70–99)
Glucose-Capillary: 144 mg/dL — ABNORMAL HIGH (ref 70–99)
Glucose-Capillary: 145 mg/dL — ABNORMAL HIGH (ref 70–99)
Glucose-Capillary: 156 mg/dL — ABNORMAL HIGH (ref 70–99)
Glucose-Capillary: 162 mg/dL — ABNORMAL HIGH (ref 70–99)
Glucose-Capillary: 164 mg/dL — ABNORMAL HIGH (ref 70–99)

## 2022-09-23 LAB — TYPE AND SCREEN
ABO/RH(D): O NEG
Antibody Screen: NEGATIVE
Unit division: 0

## 2022-09-23 LAB — BASIC METABOLIC PANEL
Anion gap: 9 (ref 5–15)
BUN: 14 mg/dL (ref 8–23)
CO2: 26 mmol/L (ref 22–32)
Calcium: 8.5 mg/dL — ABNORMAL LOW (ref 8.9–10.3)
Chloride: 92 mmol/L — ABNORMAL LOW (ref 98–111)
Creatinine, Ser: 0.49 mg/dL — ABNORMAL LOW (ref 0.61–1.24)
GFR, Estimated: >60 mL/min (ref >60)
Glucose, Bld: 155 mg/dL — ABNORMAL HIGH (ref 70–99)
Potassium: 3.9 mmol/L (ref 3.5–5.1)
Sodium: 127 mmol/L — ABNORMAL LOW (ref 135–145)

## 2022-09-23 LAB — PREALBUMIN: Prealbumin: 15 mg/dL — ABNORMAL LOW (ref 18–38)

## 2022-09-23 MED ORDER — ORAL CARE MOUTH RINSE: 15.0000 mL | OROMUCOSAL | Status: DC | PRN

## 2022-09-23 MED ORDER — ORAL CARE MOUTH RINSE
15.0000 mL | OROMUCOSAL | Status: DC
  Administered 2022-09-23 – 2022-09-27 (×7): 15 mL via OROMUCOSAL

## 2022-09-23 NOTE — Progress Notes (Signed)
Mobility Specialist Progress Note    09/23/22 1501  Mobility  Activity Contraindicated/medical hold   RN advised. Pt has fever. Will f/u as schedule permits.   Hildred Alamin Mobility Specialist  Please Psychologist, sport and exercise or Rehab Office at [phone]

## 2022-09-23 NOTE — Evaluation (Signed)
Physical Therapy Evaluation Patient Details Name: [name] [name] MRN: [id number] DOB: Sep 19, 1961 Today's Date: 09/23/2022  History of Present Illness  Pt is a 61 y.o. 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 Workup for anastomotic leak, to OR for stent repositioning and pleural space drainage 12/4. Other PMH includes radiation therapy (04/2022-06/2022).   Clinical Impression  Pt presents with an overall decrease in functional mobility secondary to above. 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 Today, pt able to ambulate multiple bouts of short distance with RW and min guard, prolonged seated rest breaks to recover fatigue and SOB. Pt would benefit from continued acute PT services to maximize functional mobility and independence prior to d/c home.     HR up to 140s with ambulation; SpO2 >/94% on RA    Recommendations for follow up therapy are one component of a multi-disciplinary discharge planning process, led by the attending physician.  Recommendations may be updated based on patient status, additional functional criteria and insurance authorization.  Follow Up Recommendations Home health PT      Assistance Recommended at Discharge Intermittent Supervision/Assistance  Patient can return home with the following  A little help with bathing/dressing/bathroom;Assistance with cooking/housework;Direct supervision/assist for medications management;Direct supervision/assist for financial management;Assist for transportation;Help with stairs or ramp for entrance    Equipment Recommendations  (pt declines any DME)  Recommendations for Other Services    Occupational Therapy; Mobility Specialist    Functional Status Assessment Patient has had a recent decline in their functional status and demonstrates the ability to make significant improvements in function in a reasonable and predictable amount of time.     Precautions / Restrictions Precautions Precautions: Fall;Other (comment) Precaution Comments: R-side chest tube and JP drain, peg; watch HR Restrictions Weight Bearing Restrictions: No      Mobility  Bed Mobility Overal bed mobility: Modified Independent                  Transfers Overall transfer level: Needs assistance Equipment used: Rolling walker (2 wheels) Transfers: Sit to/from Stand, Bed to chair/wheelchair/BSC Sit to Stand: Min guard   Step pivot transfers: Min guard       General transfer comment: step pivot from bed<>BSC with min guard for balance/lines; additional multiple sit<>stands from chair to RW with supervision    Ambulation/Gait Ambulation/Gait assistance: Min guard Gait Distance (Feet): 16 Feet (+ 32' + 28' + 24') Assistive device: Rolling walker (2 wheels) Gait Pattern/deviations: Step-through pattern, Decreased stride length, Trunk flexed Gait velocity: Decreased     General Gait Details: quick but fatigued gait with RW and intermittent min guard for balance; chair follow as pt frequently requesting seated rest break secondary to fatigue. DOE 3/4 with cues for pursed lip breathing during seated rest  Stairs            Wheelchair Mobility    Modified Rankin (Stroke Patients Only)       Balance Overall balance assessment: Needs assistance   Sitting balance-Leahy Scale: Good     Standing balance support: Single extremity supported, During functional activity Standing balance-Leahy Scale: Poor Standing balance comment: pt able to perform standing posterior pericare with single UE support on RW  Pertinent Vitals/Pain Pain  Assessment Pain Assessment: Faces Faces Pain Scale: Hurts little more Pain Location: generalized Pain Descriptors / Indicators: Tiring Pain Intervention(s): Monitored during session, Limited activity within patient's tolerance    Home Living Family/patient expects to be discharged to:: Private residence Living Arrangements: Spouse/significant other Available Help at Discharge: Family;Available 24 hours/day Type of Home: House Home Access: Stairs to enter   [employer] of Steps: 1   Home Layout: Two level;Able to live on main level with bedroom/bathroom Home Equipment: Kasandra Knudsen - single point      Prior Function Prior Level of Function : Independent/Modified Independent             Mobility Comments: typically independent without DME before getting sick, 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 Since most recent admission, pt home for 1 wk limited household ambulator with [facility] (has chairs for rest breaks around house), not leaving house ADLs Comments: Since most recent [place], has [place] aide assist with bathing; wife assists with other ADL/iADLs     Hand Dominance        Extremity/Trunk Assessment   Upper Extremity Assessment Upper Extremity Assessment: Generalized weakness    Lower Extremity Assessment Lower Extremity Assessment: Generalized weakness       Communication   Communication: No difficulties  Cognition Arousal/Alertness: Awake/alert Behavior During Therapy: Flat affect Overall Cognitive Status: Within Functional Limits for tasks assessed                                 General Comments: WFL for simple tasks, not formally assessed. Not forthcoming with information, seems anxious regarding mobility and fatigue ("just tell me what the plan is"); following simple commands appropriately        General Comments General comments (skin integrity, edema, etc.): HR up to 140s with ambulation; SpO2 >/94% on RA. educ on activity  recommendations, importance of increased mobility, pulmonary toileting (pt reports not interested in doing FV/IS more than what he already is)    Exercises Other Exercises Other Exercises: flutter valve x5 with good technique (non-productive cough); incentive spirometer x8 (pt pulling ~1000 mL with min cues for technique)   Assessment/Plan    PT Assessment Patient needs continued PT services  PT Problem List Decreased strength;Decreased activity tolerance;Decreased balance;Decreased mobility;Decreased knowledge of use of DME;Cardiopulmonary status limiting activity;Pain       PT Treatment Interventions DME instruction;Gait training;Stair training;Functional mobility training;Therapeutic activities;Therapeutic exercise;Patient/family education;Balance training    PT Goals (Current goals can be found in the Care Plan section)  Acute Rehab PT Goals Patient Stated Goal: "I want to know what the plan is"; return home PT Goal Formulation: With patient Time For Goal Achievement: 10/07/22 Potential to Achieve Goals: Good    Frequency Min 3X/week     Co-evaluation               AM-PAC PT "6 Clicks" Mobility  Outcome Measure Help needed turning from your back to your side while in a flat bed without using bedrails?: A Little Help needed moving from lying on your back to sitting on the side of a flat bed without using bedrails?: A Little Help needed moving to and from a bed to a chair (including a wheelchair)?: A Little Help needed standing up from a chair using your arms (e.g., wheelchair or bedside chair)?: A Little Help needed to walk in hospital room?: A Little Help needed climbing 3-5 steps with  a railing? : A Lot 6 Click Score: 17    End of Session Equipment Utilized During Treatment: Gait belt Activity Tolerance: Patient tolerated treatment well;Patient limited by fatigue Patient left: in chair;with call bell/phone within reach Nurse Communication: Mobility status PT  Visit Diagnosis: Other abnormalities of gait and mobility (R26.89);Muscle weakness (generalized) (M62.81)    Time: 1030-1058 PT Time Calculation (min) (ACUTE ONLY): 28 min   Charges:   PT Evaluation $PT Eval Moderate Complexity: 1 Mod PT Treatments $Therapeutic Exercise: 8-22 mins       [name], PT, DPT Acute Rehabilitation Services  Personal: [facility] Office: [place] 09/23/2022, 11:35 AM

## 2022-09-23 NOTE — Progress Notes (Addendum)
      [address]       [place] [address]             [phone]       4 Days Post-Op Procedure(s) (LRB): VIDEO ASSISTED THORACOSCOPY (Right) ESOPHAGOGASTRODUODENOSCOPY (EGD) (N/A)  Subjective: Patient just waking up. He did not walk yesterday.  Objective: Vital signs in last 24 hours: Temp:  [98.4 F (36.9 C)-99.4 F (37.4 C)] 98.6 F (37 C) (12/08 0330) Pulse Rate:  [92-100] 100 (12/08 0001) Cardiac Rhythm: Sinus tachycardia (12/08 0730) Resp:  [16-40] 23 (12/07 1940) BP: (103-122)/(72-76) 122/75 (12/07 1940) SpO2:  [96 %-97 %] 96 % (12/08 0001) Weight:  [81 kg] 81 kg (12/08 0330)     Intake/Output from previous day: 12/07 0701 - 12/08 0700 In: 37 [Blood:345; IV Piggyback:270] Out: 0938 [Urine:1500; Chest Tube:28]   Physical Exam:  Cardiovascular: RRR Pulmonary: Clear to auscultation on left and diminished on right base Abdomen: Soft, non tender, bowel sounds present. Extremities: No LE edema Wounds: Clean and dry.  No erythema or signs of infection. Right chest tube and JP drain: Chest tube to water seal.  Lab Results: CBC: Recent Labs    09/21/22 0358  WBC 5.3  HGB 7.5*  HCT 24.2*  PLT 325    BMET:  Recent Labs    09/21/22 0358 09/23/22 0431  NA 133* 127*  K 3.3* 3.9  CL 101 92*  CO2 24 26  GLUCOSE 185* 155*  BUN 26* 14  CREATININE 0.62 0.49*  CALCIUM 8.3* 8.5*     PT/INR: No results for input(s): "LABPROT", "INR" in the last 72 hours. ABG:  INR: Will add last result for INR, ABG once components are confirmed Will add last 4 CBG results once components are confirmed  Assessment/Plan:  1. CV - SR, ST at times 2.  Pulmonary - On room air. Chest tube output with 28 cc recorded for 12 hours. Light milky like color from chest tube. JP drain with scant output this am. Encourage incentive spirometer 3. CBGs 147/144/156.  No history of DM but on tube feedings. 4. GI-NPO. Tube feedings at 60 ml/hr. Imodium PRN  diarrhea. Pre albumin 15 this am (improved) 5. Mild hyponatremia-Last sodium 133 . He has had hyponatremia since admission.  6. Anemia of chronic disease-Last H and H  7.5 and 24.2 7. ID-on Zosyn, Diflucan and Vancomycin for esophageal anastomotic leak., E. Coli from right pleural fluid. Remains afebrile. Last WBC 5300. Culture results show E. Coli.  8. On Lovenox for DVT prophylaxis 9. Hyponatremia-sodium this am 127. Will discuss with Dr. Masyn Fullam/nutrition 9. Deconditioned-PT/OT. Must ambulate  [name] M ZimmermanPA-C 09/23/2022,7:39 AM    Agree with above. Previous albumin has increased to 15. Minimal chest tube output. Continue conservative management for now.  [name] [name]

## 2022-09-23 NOTE — Progress Notes (Signed)
   09/23/22 1440  Assess: MEWS Score  Temp (!) 101.3 F (38.5 C)  BP 120/76  MAP (mmHg) 89  Pulse Rate (!) 132  Level of Consciousness Alert  SpO2 96 %  O2 Device Room Air  Patient Activity (if Appropriate) In bed  Assess: MEWS Score  MEWS Temp 1  MEWS Systolic 0  MEWS Pulse 3  MEWS RR 2  MEWS LOC 0  MEWS Score 6  MEWS Score Color Red  Assess: if the MEWS score is Yellow or Red  Were vital signs taken at a resting state? Yes  Focused Assessment No change from prior assessment  Does the patient meet 2 or more of the SIRS criteria? Yes  Does the patient have a confirmed or suspected source of infection? Yes  Provider and Rapid Response Notified? Yes  MEWS guidelines implemented *See Row Information* Yes  Treat  MEWS Interventions Administered prn meds/treatments  Pain Scale 0-10  Pain Score 5  Pain Type Surgical pain  Pain Location Chest  Pain Orientation Right  Pain Descriptors / Indicators Aching  Pain Frequency Intermittent  Pain Onset Gradual  Pain Intervention(s) Medication (See eMAR)  Take Vital Signs  Increase Vital Sign Frequency  Red: Q 1hr X 4 then Q 4hr X 4, if remains red, continue Q 4hrs  Escalate  MEWS: Escalate Red: discuss with charge nurse/RN and provider, consider discussing with RRT  Notify: Charge Nurse/RN  Name of Charge Nurse/RN Notified Ben, RN  Date Charge Nurse/RN Notified 09/23/22  Time Charge Nurse/RN Notified 1457  Provider Notification  Provider Name/Title Tacy Dura, [place]  Date Provider Notified 09/23/22  Time Provider Notified 1455  Method of Notification Page  Notification Reason Change in status (Red mews, fever 101.3, HR 120s RR 22-24)  Provider response Other (Comment) (will be coming to bedside to see patient soon.)  Notify: Rapid Response  Name of Rapid Response RN Notified Jarrett Soho, Rn  Date Rapid Response Notified 09/23/22  Time Rapid Response Notified 1504  Assess: SIRS CRITERIA  SIRS Temperature  1  SIRS Pulse 1  SIRS  Respirations  1  SIRS WBC 1  SIRS Score Sum  4   Patient's heart rate continued to be increased. When in room, noted patient's face red with sweat glistening. Patient temp 101.3, HR 110-120s, and RR 22-24. Tylenol given. Rapid called, and put patient on radar. Lungs sound clear, no difference from this morning. MD paged and PA to bedside 30 mins to see patient. Continue to monitor.

## 2022-09-23 NOTE — Evaluation (Signed)
Occupational Therapy Evaluation Patient Details Name: [name] [name] MRN: [id number] DOB: [birth year] Today's Date: 09/23/2022   History of Present Illness Pt is a 61 y.o. 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 Workup for anastomotic leak, to OR for stent repositioning and pleural space drainage 12/4. Other PMH includes chemotherapy and radiation therapy (04/2022-06/2022).   Clinical Impression   Prior to illness, pt was independent with ADL and mobility. After last hospitalization, pt was set up with [facility] services and a [place]. Pt limited this session by fatigue and requires min A with mobility and ADL tasks due to below listed deficits. HR into the high 120s with step step pivot as he felt he was unable to walk again after PT session. Pt completing incentive spirometer exercises on his own - will benefit from T -band exercises. Encouraged pt to spend more time OOB in recliner. Given level of fatigue, will assess mobility with rollator. Acute OT to follow to facilitate safe DC home with HHOT.      Recommendations for follow up therapy are one component of a multi-disciplinary discharge planning process, led by the attending physician.  Recommendations may be updated based on patient status, additional functional criteria and insurance authorization.   Follow Up Recommendations  Home health OT     Assistance Recommended at Discharge Frequent or constant Supervision/Assistance  Patient can return home with the following A little help with walking and/or transfers;A little help with bathing/dressing/bathroom;Assistance with cooking/housework;Assist for transportation;Help with stairs or ramp for entrance    Functional Status Assessment  Patient has had a recent decline in their functional status and demonstrates the ability to  make significant improvements in function in a reasonable and predictable amount of time.  Equipment Recommendations  Other (comment) (rollator - will further assess)    Recommendations for Other Services       Precautions / Restrictions Precautions Precautions: Fall;Other (comment) Precaution Comments: R-side chest tube and JP drain, peg; watch HR Restrictions Weight Bearing Restrictions: No      Mobility Bed Mobility Overal bed mobility: Modified Independent                  Transfers Overall transfer level: Needs assistance Equipment used: Rolling walker (2 wheels) Transfers: Sit to/from Stand, Bed to chair/wheelchair/BSC Sit to Stand: Min guard     Step pivot transfers: Min assist     General transfer comment: min A stand pivot without RW      Balance Overall balance assessment: Needs assistance   Sitting balance-Leahy Scale: Good     Standing balance support: Single extremity supported, During functional activity Standing balance-Leahy Scale: Poor Standing balance comment: pt able to perform standing posterior pericare with single UE support on RW                           ADL either performed or assessed with clinical judgement   ADL Overall ADL's : Needs assistance/impaired Eating/Feeding: NPO   Grooming: Set up   Upper Body Bathing: Set up   Lower Body Bathing: Minimal assistance;Sit to/from stand   Upper Body Dressing : Set up   Lower Body Dressing: Minimal assistance;Sit to/from stand Lower Body Dressing Details (indicate cue type and reason): able to complete figure four positioning Toilet Transfer: Minimal assistance;Stand-pivot   Toileting- Clothing Manipulation and Hygiene:  Minimal assistance       Functional mobility during ADLs: Minimal assistance General ADL Comments: easily fatigues with activity     Vision Baseline Vision/History: 1 Wears glasses       Perception     Praxis      Pertinent Vitals/Pain  Pain Assessment Pain Assessment: Faces Faces Pain Scale: Hurts even more Breathing: normal Negative Vocalization: none Facial Expression: smiling or inexpressive Body Language: relaxed Consolability: no need to console PAINAD Score: 0 Pain Location: chest Pain Descriptors / Indicators: Tiring, Sharp Pain Intervention(s): Limited activity within patient's tolerance     Hand Dominance Right   Extremity/Trunk Assessment Upper Extremity Assessment Upper Extremity Assessment: Generalized weakness   Lower Extremity Assessment Lower Extremity Assessment: Defer to PT evaluation   Cervical / Trunk Assessment Cervical / Trunk Assessment: Other exceptions (mulitple tubes/uncomfortable)   Communication Communication Communication: No difficulties   Cognition Arousal/Alertness: Awake/alert Behavior During Therapy: Flat affect Overall Cognitive Status: Within Functional Limits for tasks assessed                                 General Comments: likes to know "the plan"     General Comments  HR high 120s with stand pivot step transfer    Exercises Exercises: Other exercises Other Exercises Other Exercises: encouraged bed general  level exercises   Shoulder Instructions      Home Living Family/patient expects to be discharged to:: Private residence Living Arrangements: Spouse/significant other Available Help at Discharge: Family;Available 24 hours/day Type of Home: House Home Access: Stairs to enter [employer] of Steps: 1   Home Layout: Two level;Able to live on main level with bedroom/bathroom     Bathroom Shower/Tub: Occupational psychologist: Standard Bathroom Accessibility: Yes How Accessible: Accessible via walker Home Equipment: [place] - single point;Shower seat - built in;Hand held shower head          Prior Functioning/Environment Prior Level of Function : Independent/Modified Independent             Mobility Comments:  typically independent without DME before getting sick, 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 Since most recent admission, pt home for 1 wk limited household ambulator with [facility] (has chairs for rest breaks around house), not leaving house; has HHOT/PT set up ADLs Comments: Since most recent [place], has [place] aide assist with bathing; wife assists with other ADL/iADLs        OT Problem List: Decreased strength;Decreased activity tolerance;Impaired balance (sitting and/or standing);Decreased safety awareness;Decreased knowledge of use of DME or AE;Cardiopulmonary status limiting activity;Pain      OT Treatment/Interventions: Self-care/ADL training;Therapeutic exercise;Energy conservation;DME and/or AE instruction;Therapeutic activities;Patient/family education;Balance training    OT Goals(Current goals can be found in the care plan section) Acute Rehab OT Goals Patient Stated Goal: to get better OT Goal Formulation: With patient Time For Goal Achievement: 10/07/22 Potential to Achieve Goals: Good  OT Frequency: Min 2X/week    Co-evaluation              AM-PAC OT "6 Clicks" Daily Activity     Outcome Measure Help from another person eating meals?: Total (NPO) Help from another person taking care of personal grooming?: A Little Help from another person toileting, which includes using toliet, bedpan, or urinal?: A Little Help from another person bathing (including washing, rinsing, drying)?: A Little Help from another person to put on and taking off regular  upper body clothing?: A Little Help from another person to put on and taking off regular lower body clothing?: A Little 6 Click Score: 16   End of Session Nurse Communication: Mobility status  Activity Tolerance: Patient limited by fatigue Patient left: in bed;with call bell/phone within reach  OT Visit Diagnosis: Unsteadiness on feet (R26.81);Other abnormalities of gait and mobility (R26.89);Muscle weakness (generalized)  (M62.81);Pain Pain - part of body:  (chest)                Time: 6468-0321 OT Time Calculation (min): 23 min Charges:  OT General Charges $OT Visit: 1 Visit OT Evaluation $OT Eval Moderate Complexity: 1 Mod OT Treatments $Self Care/Home Management : 8-22 mins  Maurie Boettcher, OT/L   Acute OT Clinical Specialist [place] Pager [phone] Office [phone]   [facility] 09/23/2022, 12:00 PM

## 2022-09-24 LAB — AEROBIC/ANAEROBIC CULTURE W GRAM STAIN (SURGICAL/DEEP WOUND)

## 2022-09-24 LAB — BASIC METABOLIC PANEL
Anion gap: 10 (ref 5–15)
BUN: 13 mg/dL (ref 8–23)
CO2: 25 mmol/L (ref 22–32)
Calcium: 8.4 mg/dL — ABNORMAL LOW (ref 8.9–10.3)
Chloride: 94 mmol/L — ABNORMAL LOW (ref 98–111)
Creatinine, Ser: 0.58 mg/dL — ABNORMAL LOW (ref 0.61–1.24)
GFR, Estimated: >60 mL/min (ref >60)
Glucose, Bld: 181 mg/dL — ABNORMAL HIGH (ref 70–99)
Potassium: 4 mmol/L (ref 3.5–5.1)
Sodium: 129 mmol/L — ABNORMAL LOW (ref 135–145)

## 2022-09-24 LAB — GLUCOSE, CAPILLARY
Glucose-Capillary: 148 mg/dL — ABNORMAL HIGH (ref 70–99)
Glucose-Capillary: 152 mg/dL — ABNORMAL HIGH (ref 70–99)
Glucose-Capillary: 154 mg/dL — ABNORMAL HIGH (ref 70–99)
Glucose-Capillary: 162 mg/dL — ABNORMAL HIGH (ref 70–99)
Glucose-Capillary: 166 mg/dL — ABNORMAL HIGH (ref 70–99)
Glucose-Capillary: 168 mg/dL — ABNORMAL HIGH (ref 70–99)

## 2022-09-24 MED ORDER — NUTRISOURCE FIBER PO PACK
1.0000 | PACK | Freq: Two times a day (BID) | ORAL | Status: DC
  Administered 2022-09-24 – 2022-11-01 (×52): 1
  Filled 2022-09-24 (×79): qty 1

## 2022-09-24 MED ORDER — CHLORHEXIDINE GLUCONATE CLOTH 2 % EX PADS
6.0000 | MEDICATED_PAD | Freq: Every day | CUTANEOUS | Status: DC
  Administered 2022-09-24 – 2022-11-19 (×55): 6 via TOPICAL

## 2022-09-24 NOTE — Progress Notes (Addendum)
      [address]       [place] [address]             [phone]      5 Days Post-Op Procedure(s) (LRB): VIDEO ASSISTED THORACOSCOPY (Right) ESOPHAGOGASTRODUODENOSCOPY (EGD) (N/A)  Subjective:  Patient with persistent watery stools overnight.  Immodium provided no relief  Objective: Vital signs in last 24 hours: Temp:  [98.3 F (36.8 C)-101.3 F (38.5 C)] 99.3 F (37.4 C) (12/09 0741) Pulse Rate:  [111-132] 115 (12/09 0741) Cardiac Rhythm: Sinus tachycardia (12/09 0700) Resp:  [18-27] 25 (12/09 0741) BP: (104-120)/(66-81) 114/67 (12/09 0741) SpO2:  [95 %-96 %] 96 % (12/09 0258) Weight:  [80.4 kg] 80.4 kg (12/09 0258)  Intake/Output from previous day: 12/08 0701 - 12/09 0700 In: -  Out: 1110 [TDVVO:1607; Chest Tube:40]  General appearance: alert, cooperative, and no distress Heart: regular rate and rhythm and tachy Lungs: diminished on right Abdomen: soft, non-tender; bowel sounds normal; no masses,  no organomegaly Extremities: extremities normal, atraumatic, no cyanosis or edema Wound: clean, surgical incision skin edges have dehisced with minor drainage, no evidence of wound infection  Lab Results: No results for input(s): "WBC", "HGB", "HCT", "PLT" in the last 72 hours. BMET:  Recent Labs    09/23/22 0431 09/24/22 0326  NA 127* 129*  K 3.9 4.0  CL 92* 94*  CO2 26 25  GLUCOSE 155* 181*  BUN 14 13  CREATININE 0.49* 0.58*  CALCIUM 8.5* 8.4*    PT/INR: No results for input(s): "LABPROT", "INR" in the last 72 hours. ABG No results found for: "PHART", "HCO3", "TCO2", "ACIDBASEDEF", "O2SAT" CBG (last 3)  Recent Labs    09/23/22 2321 09/24/22 0315 09/24/22 0740  GLUCAP 137* 162* 152*    Assessment/Plan: S/P Procedure(s) (LRB): VIDEO ASSISTED THORACOSCOPY (Right) ESOPHAGOGASTRODUODENOSCOPY (EGD) (N/A)  CV- ST Pulm- on RA, CXR with worsening right sided hydropneumothorax, CT was ordered to 40 cm suction, however this was not  done.. nurse today will hook up to suction, CT/JP drain output remain low.. leave all drains in place GI- NPO due to anastomotic leak S/P Esophagectomy, stent in place.. on tube feedings, persistent diarrhea, no relief with imodium, will add Fiber supplement per tube Hyponatremia- due to free water intake, improved to 129 monitor ID- spiked temp yesterday, low grade this morning, no leukocytosis.. E Coli from OR cultures... on Vanc, Zosyn, Diflucan.. may need to re-consult ID Malnutrition- improving Dispo- patient with new onset fever yesterday, CXR with increase in right hydropneumothorax, CT to be on 40 cm of suction, nurse will execute today.Marland Kitchen add fiber supplement for loose stools, continue IV ABX coverage, hyponatremia improved   LOS: 8 days    Erin Barrett, PA-C 09/24/2022  DR Sharonda Llamas ADDENDUM AGREE TUBE HAS PUS LOOKING MATERIAL INSIDE TUBE IS AT NEG 40  CBC ORDERED FOR TOMORROW AM XR IS ALREADY ORDERED FOR TOMORROW AM

## 2022-09-24 NOTE — Progress Notes (Signed)
   09/24/22 1000  Mobility  Activity Contraindicated/medical hold   Mobility Specialist Progress Note  Nurses asking to hold off on walking patient for the weekend.   Lucious Groves Mobility Specialist  Please contact via SecureChat or Rehab office at [phone]

## 2022-09-25 ENCOUNTER — Inpatient Hospital Stay (HOSPITAL_COMMUNITY): Payer: No Typology Code available for payment source

## 2022-09-25 LAB — GLUCOSE, CAPILLARY
Glucose-Capillary: 131 mg/dL — ABNORMAL HIGH (ref 70–99)
Glucose-Capillary: 142 mg/dL — ABNORMAL HIGH (ref 70–99)
Glucose-Capillary: 144 mg/dL — ABNORMAL HIGH (ref 70–99)
Glucose-Capillary: 144 mg/dL — ABNORMAL HIGH (ref 70–99)
Glucose-Capillary: 158 mg/dL — ABNORMAL HIGH (ref 70–99)
Glucose-Capillary: 173 mg/dL — ABNORMAL HIGH (ref 70–99)

## 2022-09-25 LAB — CBC
HCT: 25.3 % — ABNORMAL LOW (ref 39.0–52.0)
Hemoglobin: 8.5 g/dL — ABNORMAL LOW (ref 13.0–17.0)
MCH: 31.5 pg (ref 26.0–34.0)
MCHC: 33.6 g/dL (ref 30.0–36.0)
MCV: 93.7 fL (ref 80.0–100.0)
Platelets: 320 10*3/uL (ref 150–400)
RBC: 2.7 MIL/uL — ABNORMAL LOW (ref 4.22–5.81)
RDW: 15 % (ref 11.5–15.5)
WBC: 13.2 10*3/uL — ABNORMAL HIGH (ref 4.0–10.5)
nRBC: 0 % (ref 0.0–0.2)

## 2022-09-25 NOTE — Progress Notes (Signed)
Pharmacy Antibiotic Note  [name] [name] is a 61 y.o. male admitted on 10/04/2022 with  fever s/p esophagectomy for esophageal cancer .  Pharmacy has been consulted for vancomycin and Zosyn dosing. He is s/p esophagectomy on 47/4 complicated by anastomotic leak with stent placed on 11/13.   He was to have 6 weeks of abx/antifungal from 11/16 >> 12/28, initially with IV Zosyn and fluconazole. This was changed to Augmentin and oral fluconazole on discharge per ID (outpt course may need to change with new culture data).  Afebrile, WBC up today to 13.2 Pleural Cx with E coli - Zosyn S, Unasyn R  MD notes say consider ID consult.  Plan: Continue Zosyn 3.375 g IV q8h to be infused over 4 hours Monitor renal function, clinical progress, cultures/sensitivities Continue Fluconazole 800 mg IV Q 24 hours  F/u ID consult if obtained  Height: '5\' 10"'$  (177.8 cm) Weight: 80.4 kg (177 lb 4 oz) IBW/kg (Calculated) : 73  Temp (24hrs), Avg:99.4 F (37.4 C), Min:98.7 F (37.1 C), Max:101.4 F (38.6 C)  Recent Labs  Lab 10/10/2022 0540 09/21/22 0358 09/23/22 0431 09/24/22 0326 09/25/22 0540  WBC 5.0 5.3  --   --  13.2*  CREATININE 0.68 0.62 0.49* 0.58*  --      Estimated Creatinine Clearance: 97.6 mL/min (A) (by C-G formula based on SCr of 0.58 mg/dL (L)).    No Known Allergies  Antimicrobials this admission: Vancomycin 12/1 >>12/9 Zosyn 12/1 >>  Fluconazole 12/1 >>  Microbiology results: 12/4 MRSA PCR neg 12/4 body fluid: E Coli - Zosyn Joni Fears, PharmD, BCPS Please see amion for complete clinical pharmacist phone list 09/25/2022 3:08 PM

## 2022-09-25 NOTE — Progress Notes (Signed)
      [address]       [place] [address]             [phone]      6 Days Post-Op Procedure(s) (LRB): VIDEO ASSISTED THORACOSCOPY (Right) ESOPHAGOGASTRODUODENOSCOPY (EGD) (N/A)  Subjective:  Patient has no new complaints.  Objective: Vital signs in last 24 hours: Temp:  [98.7 F (37.1 C)-101.4 F (38.6 C)] 98.8 F (37.1 C) (12/10 1103) Pulse Rate:  [100-117] 100 (12/10 1103) Cardiac Rhythm: Sinus tachycardia (12/10 0700) Resp:  [22-28] 22 (12/10 1103) BP: (104-124)/(63-77) 104/66 (12/10 1103)  Intake/Output from previous day: 12/09 0701 - 12/10 0700 In: -  Out: 2500 [Urine:2450; Chest Tube:50] Intake/Output this shift: Total I/O In: -  Out: 400 [Urine:400]  General appearance: alert, cooperative, and no distress Heart: regular rate and rhythm Lungs: diminished breath sounds bibasilar Abdomen: soft, non-tender; bowel sounds normal; no masses,  no organomegaly Wound: clean and dry  Lab Results: Recent Labs    09/25/22 0540  WBC 13.2*  HGB 8.5*  HCT 25.3*  PLT 320   BMET:  Recent Labs    09/23/22 0431 09/24/22 0326  NA 127* 129*  K 3.9 4.0  CL 92* 94*  CO2 26 25  GLUCOSE 155* 181*  BUN 14 13  CREATININE 0.49* 0.58*  CALCIUM 8.5* 8.4*    PT/INR: No results for input(s): "LABPROT", "INR" in the last 72 hours. ABG No results found for: "PHART", "HCO3", "TCO2", "ACIDBASEDEF", "O2SAT" CBG (last 3)  Recent Labs    09/25/22 0308 09/25/22 0837 09/25/22 1103  GLUCAP 131* 173* 142*    Assessment/Plan: S/P Procedure(s) (LRB): VIDEO ASSISTED THORACOSCOPY (Right) ESOPHAGOGASTRODUODENOSCOPY (EGD) (N/A)  CV- ST Pulm- on RA, CXR with continued hydropneumothorax, looks slightly worse-- CT on 40 cm of suction GI- NPO due to anastomotic leak, stent in place on tube feedings, diarrhea improved after fiber Hyponatremia ID- leukocytosis trending up to 13K, E. Coli from OR cultures,on Vanc, Zosyn, Diflucan.... patient would benefit  from ID consult  Malnutrition improving Dispo- patient with worsening leukocytosis, surgical wound now draining, continue tube feedings and remain NPO for esophageal leak.... ? CT chest with oral contrast to evaluate stent placement and right chest process... does patient need another washout?   LOS: 9 days    Ellwood Handler, PA-C 09/25/2022

## 2022-09-26 ENCOUNTER — Inpatient Hospital Stay (HOSPITAL_COMMUNITY): Payer: No Typology Code available for payment source

## 2022-09-26 DIAGNOSIS — Z8501 Personal history of malignant neoplasm of esophagus: Secondary | ICD-10-CM | POA: Diagnosis not present

## 2022-09-26 DIAGNOSIS — K9189 Other postprocedural complications and disorders of digestive system: Secondary | ICD-10-CM

## 2022-09-26 LAB — BASIC METABOLIC PANEL
Anion gap: 10 (ref 5–15)
BUN: 15 mg/dL (ref 8–23)
CO2: 26 mmol/L (ref 22–32)
Calcium: 8.5 mg/dL — ABNORMAL LOW (ref 8.9–10.3)
Chloride: 91 mmol/L — ABNORMAL LOW (ref 98–111)
Creatinine, Ser: 0.58 mg/dL — ABNORMAL LOW (ref 0.61–1.24)
GFR, Estimated: >60 mL/min (ref >60)
Glucose, Bld: 154 mg/dL — ABNORMAL HIGH (ref 70–99)
Potassium: 4.2 mmol/L (ref 3.5–5.1)
Sodium: 127 mmol/L — ABNORMAL LOW (ref 135–145)

## 2022-09-26 LAB — GLUCOSE, CAPILLARY
Glucose-Capillary: 142 mg/dL — ABNORMAL HIGH (ref 70–99)
Glucose-Capillary: 171 mg/dL — ABNORMAL HIGH (ref 70–99)
Glucose-Capillary: 160 mg/dL — ABNORMAL HIGH (ref 70–99)
Glucose-Capillary: 112 mg/dL — ABNORMAL HIGH (ref 70–99)
Glucose-Capillary: 168 mg/dL — ABNORMAL HIGH (ref 70–99)

## 2022-09-26 LAB — CBC
HCT: 25.4 % — ABNORMAL LOW (ref 39.0–52.0)
Hemoglobin: 8.3 g/dL — ABNORMAL LOW (ref 13.0–17.0)
MCH: 30.4 pg (ref 26.0–34.0)
MCHC: 32.7 g/dL (ref 30.0–36.0)
MCV: 93 fL (ref 80.0–100.0)
Platelets: 327 10*3/uL (ref 150–400)
RBC: 2.73 MIL/uL — ABNORMAL LOW (ref 4.22–5.81)
RDW: 14.7 % (ref 11.5–15.5)
WBC: 14.4 10*3/uL — ABNORMAL HIGH (ref 4.0–10.5)
nRBC: 0 % (ref 0.0–0.2)

## 2022-09-26 MED ORDER — IOHEXOL 300 MG/ML  SOLN: 60.0000 mL | Freq: Once | INTRAMUSCULAR | Status: DC | PRN

## 2022-09-26 NOTE — Progress Notes (Signed)
PT Cancellation Note  Patient Details Name: [name] [name] MRN: [id number] DOB: Feb 03, 1961   Cancelled Treatment:    Reason Eval/Treat Not Completed: Patient declined any mobility this afternoon, stating "I just got too much new information." Encouragement also given by family, pt still declines, reports he will do something tomorrow. Will follow up tomorrow.  [name], PT, DPT Acute Rehabilitation Services  Personal: [place] [facility] Office: [place] 09/26/2022, 2:33 PM

## 2022-09-26 NOTE — Progress Notes (Signed)
OT Cancellation Note  Patient Details Name: [name] [name] MRN: [id number] DOB: Jan 28, 1961   Cancelled Treatment:    Reason Eval/Treat Not Completed: Patient at procedure or test/ unavailable (Off of floor upon arrival. OT to f/u as appropriate.)  Elliot Cousin 09/26/2022, 3:21 PM

## 2022-09-26 NOTE — Plan of Care (Signed)
  Problem: Clinical Measurements: Goal: Diagnostic test results will improve Outcome: Progressing Goal: Respiratory complications will improve Outcome: Progressing Goal: Cardiovascular complication will be avoided Outcome: Progressing   Problem: Activity: Goal: Risk for activity intolerance will decrease Outcome: Progressing   Problem: Nutrition: Goal: Adequate nutrition will be maintained Outcome: Progressing   Problem: Coping: Goal: Level of anxiety will decrease Outcome: Progressing   Problem: Pain Managment: Goal: General experience of comfort will improve Outcome: Progressing   Problem: Safety: Goal: Ability to remain free from injury will improve Outcome: Progressing   

## 2022-09-26 NOTE — Progress Notes (Addendum)
      [address]       [place] [address]             [phone]       7 Days Post-Op Procedure(s) (LRB): VIDEO ASSISTED THORACOSCOPY (Right) ESOPHAGOGASTRODUODENOSCOPY (EGD) (N/A)  Subjective: Patient without specific complaint this am  Objective: Vital signs in last 24 hours: Temp:  [98.7 F (37.1 C)-100.6 F (38.1 C)] 99 F (37.2 C) (12/11 0340) Pulse Rate:  [100-114] 108 (12/10 2000) Cardiac Rhythm: Sinus tachycardia (12/10 1921) Resp:  [22-25] 25 (12/11 0340) BP: (97-113)/(64-68) 106/64 (12/11 0340) SpO2:  [97 %-99 %] 97 % (12/11 0340) Weight:  [80.3 kg] 80.3 kg (12/11 0340)     Intake/Output from previous day: 12/10 0701 - 12/11 0700 In: 5015.1 [NG/GT:3095.3; IV Piggyback:1919.9] Out: 2470 [Urine:2375; Drains:65; Chest Tube:30]   Physical Exam:  Cardiovascular: Tachycardic this am Pulmonary: Clear to auscultation on left and diminished on right base Abdomen: Soft, non tender, bowel sounds present. Extremities: No LE edema Wounds: Clean and dry.  No erythema or signs of infection. Right chest tube and JP drain: Chest tube to suction  Lab Results: CBC: Recent Labs    09/25/22 0540 09/26/22 0445  WBC 13.2* 14.4*  HGB 8.5* 8.3*  HCT 25.3* 25.4*  PLT 320 327    BMET:  Recent Labs    09/24/22 0326 09/26/22 0445  NA 129* 127*  K 4.0 4.2  CL 94* 91*  CO2 25 26  GLUCOSE 181* 154*  BUN 13 15  CREATININE 0.58* 0.58*  CALCIUM 8.4* 8.5*     PT/INR: No results for input(s): "LABPROT", "INR" in the last 72 hours. ABG:  INR: Will add last result for INR, ABG once components are confirmed Will add last 4 CBG results once components are confirmed  Assessment/Plan:  1. CV - SR, ST at times 2.  Pulmonary - On room air. Chest tube output with 65 cc recorded for 24 hours. Light purulent like color from chest tube. JP drain with 65 cc last 24 hours and is darkish (bloody like) in color. CXR this am appears to show limited aeration  on right, right loculated pleural effusion. Encourage incentive spirometer 3. CBGs 144/144/142.  No history of DM but on tube feedings. 4. GI-NPO. Tube feedings at 60 ml/hr. Imodium PRN diarrhea. Pre albumin 12/08 improved to 15  5. Mild hyponatremia-Last sodium 133 . He has had hyponatremia since admission. Per Dr. Kipp Brood, will stop scheduled free water for now. Re check in am 6. Anemia of chronic disease- H and H this am stable at 8.3 and 25.4 7. ID-on Zosyn and Diflucan for esophageal anastomotic leak., E. Coli from right pleural fluid. Fever to 100.6 12/10WBC this am slightly increased from 13,200 to 14,400.  8. On Lovenox for DVT prophylaxis 9. Hyponatremia-sodium this am 127. Will discuss with Dr. Marcell Pfeifer/nutrition 9. Deconditioned-PT/OT. Must ambulate  Donielle M ZimmermanPA-C 09/26/2022,7:01 AM  Agree with above Worsening CXR, and leukocytosis Will discuss plans for definitive closure with GI now that nutritional status is improving  Amando Chaput O Jamae Tison

## 2022-09-26 NOTE — TOC Progression Note (Signed)
Transition of Care [facility]) - Progression Note    Patient Details  Name: [name] [name] MRN: [id number] Date of Birth: [birth year]  Transition of Care [facility] CM/SW Contact  Zenon Mayo, RN Phone Number: 09/26/2022, 2:39 PM  Clinical Narrative:    Patient conts with jp drain and tube feedings and per MD note plan is Barium swallow today with water-soluble contrast ,review images, and tentative plan is for EGD with replacement of current stent with covered stent versus potential clipping. He may still require repeat VATS and drainage of pleural space.  TOC following.        Expected Discharge Plan and Services                                                 Social Determinants of Health (SDOH) Interventions    Readmission Risk Interventions     No data to display

## 2022-09-26 NOTE — Consult Note (Signed)
Consultation  Referring Provider: Lajuana Matte, MD Primary Care Physician:  Lyman Bishop, DO Primary Gastroenterologist:  Dr. Bobbie Stack  Reason for Consultation: Esophageal anastomotic leak  HPI: [name] [name] is a 61 y.o. 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  He completed a course of radiation through 06/22/2022 and then underwent robotic assisted Ivor Lewis esophagectomy, pyloromyotomy and J-tube placement on 08/18/2022.  Postop course complicated by anastomotic leak, and malnutrition. He underwent EGD and esophageal stent placement per Dr. Kipp Brood on 08/29/2022 with placement of a 125 mm covered esophageal stent and also underwent right VATS at that same time.  Esophagram on 1116 showed persistent esophageal leak with contrast entering the stomach and then migrating proximally along the outer stent wall and through the previously identified anastomotic defect into the right hemithorax.  He was able to be discharged home for a short period of time but then readmitted on 10/14/2022 from the clinic with purulent drainage from his chest tubes, worsening shortness of breath generalized weakness and fevers. He has been placed on Zosyn and Diflucan He underwent EGD and repositioning of the previously placed covered esophageal stent on 09/22/2022, there is no evidence of leak with the on table images, organized empyema in the right chest and underwent drainage of the pleural space. He has continued to have intermittent low-grade fevers, and purulent drainage from the chest tube and JP.  His white count has been trending up, growing E. coli from the OR cultures and now on Vanco Zosyn and Diflucan. He is receiving tube feeds through the jejunal tube.  After discussion between Dr. Kipp Brood and Dr. Rush Landmark today and with chest x-ray continuing to show continued aeration of the right lung, right loculated effusion decision made to proceed with  another water-soluble esophagram, then possible EGD with Dr. Rush Landmark. for potential stent clipping and/or removal of the previous esophageal stent and replacement with new covered stent.   Labs today show WBC up to 14.4/hemoglobin 8.3/hematocrit 25.4 Sodium 127/potassium 4.2/glucose 154/BUN 15/creatinine 0.58  Patient denies any significant pain, uncomfortable, breathing comfortably currently, no abdominal pain, feels very weak.   Past Medical History:  Diagnosis Date   Cancer [facility])    Esophageal Cancer   History of radiation therapy    Esophagus- 05/12/22-06/22/22- Dr. Gery Pray   Port-A-Cath in place 05/11/2022    Past Surgical History:  Procedure Laterality Date   BIOPSY  03/25/2022   Procedure: BIOPSY;  Surgeon: Harvel Quale, MD;  [facility];  Service: Gastroenterology;;  GE junction;   BIOPSY  04/25/2022   Procedure: BIOPSY;  Surgeon: Irving Copas., MD;  [facility];  Service: Gastroenterology;;   COLONOSCOPY     COLONOSCOPY WITH PROPOFOL N/A 03/25/2022   Procedure: COLONOSCOPY WITH PROPOFOL;  Surgeon: Harvel Quale, MD;  [facility];  Service: Gastroenterology;  Laterality: N/A;  945 ASA 1   ESOPHAGOGASTRODUODENOSCOPY N/A 08/18/2022   Procedure: ESOPHAGOGASTRODUODENOSCOPY (EGD);  Surgeon: Lajuana Matte, MD;  Location: [facility] OR;  Service: Thoracic;  Laterality: N/A;   ESOPHAGOGASTRODUODENOSCOPY N/A 08/29/2022   Procedure: ESOPHAGOGASTRODUODENOSCOPY (EGD);  Surgeon: Lajuana Matte, MD;  Location: [facility] OR;  Service: Thoracic;  Laterality: N/A;  will need C arm   ESOPHAGOGASTRODUODENOSCOPY N/A 09/17/2022   Procedure: ESOPHAGOGASTRODUODENOSCOPY (EGD);  Surgeon: Lajuana Matte, MD;  Location: [place];  Service: Thoracic;  Laterality: N/A;  will need C arm   ESOPHAGOGASTRODUODENOSCOPY (EGD) WITH PROPOFOL N/A 03/25/2022   Procedure: ESOPHAGOGASTRODUODENOSCOPY (EGD)  WITH PROPOFOL;  Surgeon: Montez Morita, Quillian Quince, MD;  [facility];  Service: Gastroenterology;  Laterality: N/A;   ESOPHAGOGASTRODUODENOSCOPY (EGD) WITH PROPOFOL N/A 04/25/2022   Procedure: ESOPHAGOGASTRODUODENOSCOPY (EGD) WITH PROPOFOL;  Surgeon: Rush Landmark Telford Nab., MD;  Location: [facility];  Service: Gastroenterology;  Laterality: N/A;   INTERCOSTAL NERVE BLOCK Right 08/18/2022   Procedure: INTERCOSTAL NERVE BLOCK;  Surgeon: Lajuana Matte, MD;  Location: [place];  Service: Thoracic;  Laterality: Right;   IR IMAGING GUIDED PORT INSERTION  05/04/2022   LIPOMA EXCISION     years ago   UPPER ESOPHAGEAL ENDOSCOPIC ULTRASOUND (EUS) N/A 04/25/2022   Procedure: UPPER ESOPHAGEAL ENDOSCOPIC ULTRASOUND (EUS);  Surgeon: Irving Copas., MD;  Location: [facility];  Service: Gastroenterology;  Laterality: N/A;   VIDEO ASSISTED THORACOSCOPY Right 08/29/2022   Procedure: VIDEO ASSISTED THORACOSCOPY;  Surgeon: Lajuana Matte, MD;  Location: [place];  Service: Thoracic;  Laterality: Right;   VIDEO ASSISTED THORACOSCOPY Right 09/24/2022   Procedure: VIDEO ASSISTED THORACOSCOPY;  Surgeon: Lajuana Matte, MD;  Location: [place];  Service: Thoracic;  Laterality: Right;    Prior to Admission medications   Medication Sig Start Date End Date Taking? Authorizing Provider  acetaminophen (TYLENOL) 160 MG/5ML solution Place 20.3 mLs (650 mg total) into feeding tube every 8 (eight) hours as needed for mild pain. 08/25/22  Yes Lars Pinks M, PA-C  amoxicillin-clavulanate (AUGMENTIN) 400-57 MG/5ML suspension Place 10.9 mLs (875 mg total) into feeding tube every 12 (twelve) hours. 09/09/22 10/13/22 Yes Barrett, Erin R, PA-C  fluconazole (DIFLUCAN) 40 MG/ML suspension Place 10 mLs (400 mg total) into feeding tube daily. 09/07/22  Yes Barrett, Erin R, PA-C  Nutritional Supplements (FEEDING SUPPLEMENT, OSMOLITE 1.5 CAL,) LIQD Place 90 mL/hr into feeding tube daily. For 18 hours each day. 09/06/22  Yes Barrett,  Erin R, PA-C  Protein (FEEDING SUPPLEMENT, PROSOURCE TF20,) liquid Place 60 mLs into feeding tube daily. Patient taking differently: Place 60 mLs into feeding tube daily. Comes in 45 ml packets, givng one packet daily for now per wife, between tube feeds 08/25/22  Yes Lars Pinks M, PA-C  sodium chloride flush (NS) 0.9 % SOLN Place 30 mLs into feeding tube every 8 (eight) hours. Patient taking differently: Place 30 mLs into feeding tube every 8 (eight) hours. Also doing 1200 ml purified water d/t dehydration per wife. 09/06/22  Yes Barrett, Erin R, PA-C    Current Facility-Administered Medications  Medication Dose Route Frequency Provider Last Rate Last Admin   acetaminophen (TYLENOL) 160 MG/5ML solution 650 mg  650 mg Per Tube Q6H PRN Antony Odea, PA-C   650 mg at 09/25/22 2345   Chlorhexidine Gluconate Cloth 2 % PADS 6 each  6 each Topical Daily Lajuana Matte, MD   6 each at 09/26/22 1006   enoxaparin (LOVENOX) injection 40 mg  40 mg Subcutaneous Q24H Roddenberry, Myron G, PA-C   40 mg at 09/24/22 2255   feeding supplement (OSMOLITE 1.5 CAL) liquid 1,000 mL  1,000 mL Per Tube Continuous Lajuana Matte, MD 75 mL/hr at 09/26/22 1200 Infusion Verify at 09/26/22 1200   feeding supplement (PROSource TF20) liquid 60 mL  60 mL Per Tube Daily Lajuana Matte, MD   60 mL at 09/25/22 0840   fentaNYL (SUBLIMAZE) injection 25 mcg  25 mcg Intravenous Q3H PRN Lars Pinks M, PA-C       fiber (NUTRISOURCE FIBER) 1 packet  1 packet Per Tube BID Barrett, Erin R, PA-C  1 packet at 09/25/22 2235   fluconazole (DIFLUCAN) IVPB 800 mg  800 mg Intravenous Q24H Lajuana Matte, MD 200 mL/hr at 09/25/22 1755 400 mg at 09/25/22 1755   HYDROcodone-acetaminophen (HYCET) 7.5-325 mg/15 ml solution 10 mL  10 mL Per Tube Q4H PRN Lajuana Matte, MD   10 mL at 09/26/22 1058   influenza vac split quadrivalent PF (FLUARIX) injection 0.5 mL  0.5 mL Intramuscular Prior to discharge  Lajuana Matte, MD       loperamide HCl (IMODIUM) 1 MG/7.5ML suspension 2 mg  2 mg Per Tube PRN Roddenberry, Myron G, PA-C   2 mg at 09/25/22 0840   ondansetron (ZOFRAN) injection 4 mg  4 mg Intravenous Q6H PRN Roddenberry, Myron G, PA-C       Oral care mouth rinse  15 mL Mouth Rinse 4 times per day Lajuana Matte, MD   15 mL at 09/26/22 1155   Oral care mouth rinse  15 mL Mouth Rinse PRN Lajuana Matte, MD       pantoprazole (PROTONIX) injection 40 mg  40 mg Intravenous Q24H Roddenberry, Myron G, PA-C   40 mg at 09/25/22 2223   piperacillin-tazobactam (ZOSYN) IVPB 3.375 g  3.375 g Intravenous Q8H Alvira Philips, RPH 12.5 mL/hr at 09/26/22 1414 3.375 g at 09/26/22 1414   pneumococcal 20-valent conjugate vaccine (PREVNAR 20) injection 0.5 mL  0.5 mL Intramuscular Prior to discharge Lightfoot, Lucile Crater, MD       sodium chloride flush (NS) 0.9 % injection 30 mL  30 mL Per Tube Q8H Roddenberry, Myron G, PA-C   30 mL at 09/24/22 2300    Allergies as of 10/12/2022   (No Known Allergies)    Family History  Problem Relation Age of Onset   Cancer Maternal Grandmother        Breast cancer   Cancer Other        Maternal Cousin - breast cancer    Social History   Socioeconomic History   Marital status: Married    Spouse name: Not on file   Number of children: Not on file   Years of education: Not on file   Highest education level: Not on file  Occupational History   Not on file  Tobacco Use   Smoking status: Never    Passive exposure: Past   Smokeless tobacco: Never  Vaping Use   Vaping Use: Never used  Substance and Sexual Activity   Alcohol use: Yes    Comment: 2-3 per week   Drug use: Never   Sexual activity: Yes  Other Topics Concern   Not on file  Social History Narrative   Not on file   Social Determinants of Health   Financial Resource Strain: Not on file  Food Insecurity: No Food Insecurity (09/20/2022)   Hunger Vital Sign    Worried About  Running Out of Food in the Last Year: Never true    Ran Out of Food in the Last Year: Never true  Transportation Needs: No Transportation Needs (10/01/2022)   PRAPARE - Hydrologist (Medical): No    Lack of Transportation (Non-Medical): No  Physical Activity: Not on file  Stress: Not on file  Social Connections: Not on file  Intimate Partner Violence: Not At Risk (09/27/2022)   Humiliation, Afraid, Rape, and Kick questionnaire    Fear of Current or Ex-Partner: No    Emotionally Abused: No    Physically Abused: No  Sexually Abused: No    Review of Systems:Pertinent positive and negative review of systems were noted in the above HPI section.  All other review of systems was otherwise negative.    Physical Exam: Vital signs in last 24 hours: Temp:  [98.7 F (37.1 C)-100.6 F (38.1 C)] 99.7 F (37.6 C) (12/11 1100) Pulse Rate:  [108-114] 110 (12/11 1100) Resp:  [22-25] 24 (12/11 0753) BP: (97-119)/(64-71) 119/65 (12/11 1100) SpO2:  [97 %-99 %] 98 % (12/11 1100) Weight:  [80.3 kg] 80.3 kg (12/11 0340) Last BM Date : 09/24/22 General:   Alert,  Well-developed, well-appearing older white male pleasant and cooperative in NAD, family at bedside Head:  Normocephalic and atraumatic. Eyes:  Sclera clear, no icterus.   Conjunctiva pale Nose:  No deformity, discharge,  or lesions. Mouth:  No deformity or lesions.   Neck:  Supple; no masses or thyromegaly. Lungs: Chest tube present on the right, decreased breath sounds right lung , JP drain in the right chest  heart: tachy Regular rate and rhythm; no murmurs, clicks, rubs,  or gallops. Abdomen:  Soft,nontender, BS active,nonpalp mass or hsm.  J-tube in place Rectal: Not done Msk:  Symmetrical without gross deformities. . Pulses:  Normal pulses noted. Extremities:  Without clubbing or edema. Neurologic:  Alert and  oriented x4;  grossly normal neurologically. Skin:  Intact without significant lesions or  rashes.. Psych:  Alert and cooperative. Normal mood and affect.  Intake/Output from previous day: 12/10 0701 - 12/11 0700 In: 5015.1 [NG/GT:3095.3; IV Piggyback:1919.9] Out: 2470 [Urine:2375; Drains:65; Chest Tube:30] Intake/Output this shift: Total I/O In: 0  Out: 325 [Urine:300; Drains:25]  Lab Results: Recent Labs    09/25/22 0540 09/26/22 0445  WBC 13.2* 14.4*  HGB 8.5* 8.3*  HCT 25.3* 25.4*  PLT 320 327   BMET Recent Labs    09/24/22 0326 09/26/22 0445  NA 129* 127*  K 4.0 4.2  CL 94* 91*  CO2 25 26  GLUCOSE 181* 154*  BUN 13 15  CREATININE 0.58* 0.58*  CALCIUM 8.4* 8.5*   LFT No results for input(s): "PROT", "ALBUMIN", "AST", "ALT", "ALKPHOS", "BILITOT", "BILIDIR", "IBILI" in the last 72 hours. PT/INR No results for input(s): "LABPROT", "INR" in the last 72 hours. Hepatitis Panel No results for input(s): "HEPBSAG", "HCVAB", "HEPAIGM", "HEPBIGM" in the last 72 hours.    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 Course complicated by anastomotic leak, patient underwent EGD and esophageal stent placement with covered stent per CVTS on 08/29/2022 as well as VATS. Readmitted 09/17/2022 with failure to thrive, intermittent fevers shortness of breath and purulent drainage from the JP and chest tube  Repeat EGD with repositioning of the 125 mm covered stent on 09/21/2022 and right VATS  Now despite Zosyn, Diflucan and vancomycin, and cultures growing E. coli, patient continues to have purulent chest tube and JP drainage, rising WBC, and concern for persistent esophageal anastomotic leak  #2 anemia secondary to above  PLAN: Barium swallow today with water-soluble contrast Dr. Stefani Dama Roddy will review images, and tentative plan is for EGD with replacement of current stent with  covered stent versus potential clipping. He may still require repeat VATS and drainage of pleural space   Shermika Balthaser PA-C 09/26/2022, 2:14 PM

## 2022-09-27 DIAGNOSIS — K9189 Other postprocedural complications and disorders of digestive system: Secondary | ICD-10-CM | POA: Diagnosis not present

## 2022-09-27 LAB — CBC
HCT: 24.5 % — ABNORMAL LOW (ref 39.0–52.0)
Hemoglobin: 8.1 g/dL — ABNORMAL LOW (ref 13.0–17.0)
MCH: 30.7 pg (ref 26.0–34.0)
MCHC: 33.1 g/dL (ref 30.0–36.0)
MCV: 92.8 fL (ref 80.0–100.0)
Platelets: 287 10*3/uL (ref 150–400)
RBC: 2.64 MIL/uL — ABNORMAL LOW (ref 4.22–5.81)
RDW: 14.7 % (ref 11.5–15.5)
WBC: 10.9 10*3/uL — ABNORMAL HIGH (ref 4.0–10.5)
nRBC: 0 % (ref 0.0–0.2)

## 2022-09-27 LAB — GLUCOSE, CAPILLARY
Glucose-Capillary: 152 mg/dL — ABNORMAL HIGH (ref 70–99)
Glucose-Capillary: 139 mg/dL — ABNORMAL HIGH (ref 70–99)
Glucose-Capillary: 154 mg/dL — ABNORMAL HIGH (ref 70–99)
Glucose-Capillary: 130 mg/dL — ABNORMAL HIGH (ref 70–99)
Glucose-Capillary: 168 mg/dL — ABNORMAL HIGH (ref 70–99)
Glucose-Capillary: 157 mg/dL — ABNORMAL HIGH (ref 70–99)

## 2022-09-27 LAB — BASIC METABOLIC PANEL
Anion gap: 8 (ref 5–15)
BUN: 18 mg/dL (ref 8–23)
CO2: 27 mmol/L (ref 22–32)
Calcium: 8.6 mg/dL — ABNORMAL LOW (ref 8.9–10.3)
Chloride: 92 mmol/L — ABNORMAL LOW (ref 98–111)
Creatinine, Ser: 0.55 mg/dL — ABNORMAL LOW (ref 0.61–1.24)
GFR, Estimated: >60 mL/min (ref >60)
Glucose, Bld: 152 mg/dL — ABNORMAL HIGH (ref 70–99)
Potassium: 4.7 mmol/L (ref 3.5–5.1)
Sodium: 127 mmol/L — ABNORMAL LOW (ref 135–145)

## 2022-09-27 MED ORDER — ENOXAPARIN SODIUM 40 MG/0.4ML IJ SOSY
40.0000 mg | PREFILLED_SYRINGE | INTRAMUSCULAR | Status: DC
  Administered 2022-09-28 – 2022-10-30 (×32): 40 mg via SUBCUTANEOUS
  Filled 2022-09-27 (×31): qty 0.4

## 2022-09-27 MED ORDER — FLUCONAZOLE IN SODIUM CHLORIDE 400-0.9 MG/200ML-% IV SOLN
400.0000 mg | INTRAVENOUS | Status: DC
  Administered 2022-09-27 – 2022-10-27 (×30): 400 mg via INTRAVENOUS
  Filled 2022-09-27 (×33): qty 200

## 2022-09-27 NOTE — H&P (View-Only) (Signed)
Progress Note   Subjective  Patient feels about the same. No acute issues overnight   Objective   Vital signs in last 24 hours: Temp:  [98.5 F (36.9 C)-100.2 F (37.9 C)] 98.8 F (37.1 C) (12/12 1100) Pulse Rate:  [103-130] 103 (12/12 1100) Resp:  [21-25] 21 (12/12 1100) BP: (96-123)/(64-84) 108/68 (12/12 1100) SpO2:  [94 %-98 %] 94 % (12/12 1100) Last BM Date : 09/24/22 General:    white male in NAD Neurologic:  Alert and oriented,  grossly normal neurologically. Psych:  Cooperative. Normal mood and affect.  Intake/Output from previous day: 12/11 0701 - 12/12 0700 In: 314.4 [NG/GT:225; IV Piggyback:89.4] Out: 365 [Urine:300; Drains:30; Chest Tube:35] Intake/Output this shift: Total I/O In: -  Out: 750 [Urine:750]  Lab Results: Recent Labs    09/25/22 0540 09/26/22 0445 09/27/22 0505  WBC 13.2* 14.4* 10.9*  HGB 8.5* 8.3* 8.1*  HCT 25.3* 25.4* 24.5*  PLT 320 327 287   BMET Recent Labs    09/26/22 0445 09/27/22 0505  NA 127* 127*  K 4.2 4.7  CL 91* 92*  CO2 26 27  GLUCOSE 154* 152*  BUN 15 18  CREATININE 0.58* 0.55*  CALCIUM 8.5* 8.6*   LFT No results for input(s): "PROT", "ALBUMIN", "AST", "ALT", "ALKPHOS", "BILITOT", "BILIDIR", "IBILI" in the last 72 hours. PT/INR No results for input(s): "LABPROT", "INR" in the last 72 hours.  Studies/Results: DG ESOPHAGUS W SINGLE CM (SOL OR THIN BA)  Result Date: 09/26/2022 CLINICAL DATA:  Patient history of Ivor Lewis esophagectomy on August 18, 2022 EGD. 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 Patient presents for single water-soluble contrast esophagram for further evaluation an anastomic leak EXAM: ESOPHAGUS/BARIUM SWALLOW/TABLET STUDY TECHNIQUE: Single contrast examination was performed using Omnipague 300. This exam was performed by Rushie Nyhan NP, and was supervised and interpreted by Dr. Abigail Miyamoto. FLUOROSCOPY: Radiation Exposure Index (as provided by the fluoroscopic device): 9.0 mGy Kerma COMPARISON:  Esophagram dated September 01, 2022 and CT chest abdomen pelvis dated August 28, 2022. FINDINGS: Focused, single-contrast exam performed with patient supine and in various obliquities. Initial swallows demonstrated the esophageal stent originates at the level of the clavicles. There is immediate contrast outpouching on the right side of the esophagus or gastric pull-through at the level of the carina including on image 37 of series 1. More subtle area of left-sided contrast outpouching also identified. At the level of the distal portion of the stent, contrast surrounds the stent and extends tracks minimally cephalad. IMPRESSION: Right greater than left contrast leak within the upper mediastinum, within either the esophagus or gastric pull-through. Contrast surrounding the distal stent is most likely within the gastric pull-through. No contrast extravasation at this site. Electronically Signed   By: Abigail Miyamoto M.D.   On: 09/26/2022 16:20   DG CHEST PORT 1 VIEW  Result Date: 09/26/2022 CLINICAL DATA:  Chest tube present.  Recent esophagectomy. EXAM: PORTABLE CHEST 1 VIEW COMPARISON:  09/25/22 CXR FINDINGS: Esophageal stent in place, unchanged in positioning and appearance compared to prior chest radiograph. Right-sided chest port with the tip at the cavoatrial junction. Right-sided thoracostomy tubes in place. No pneumothorax. No focal opacity seen in the left lung. Unchanged appearance of the right hemithorax with a persistent likely loculated right-sided pleural effusion. No new focal airspace opacity. IMPRESSION: 1.   Support apparatus as above. 2. No significant change from prior  exam with a persistent loculated right sided pleural effusion. Electronically Signed   By: Marin Roberts M.D.   On: 09/26/2022 08:12       Assessment / Plan:    61 y/o male with history of esophageal cancer, s/p  surgery June, XRT, complicated course postoperatively with an anastomotic leak. He has had EGD with stent placement per surgery on 08/29/22 and then VATS. Repeat EGD with stent repositioning on 09/21/2022 and another VATS. He continues to have purulent drainage, elevated WBC. Swallow study yesterday confirms he has a persistent leak. Discussed the findings with the patient and his son. Discussed his case with Dr. Rush Landmark of advanced endoscopy. Have offered the patient an EGD with Dr. Rush Landmark tomorrow afternoon to see if this can be sealed endoscopically, pending findings. Have discussed risks / benefits, he wishes to proceed. Please hold tube feeds at MN. Further recommendations pending the results.  Jolly Mango, MD [facility]

## 2022-09-27 NOTE — Progress Notes (Signed)
Physical Therapy Treatment Patient Details Name: [name] [name] MRN: [id number] DOB: 10/26/1960 Today's Date: 09/27/2022   History of Present Illness Pt is a 61 y.o. 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 Workup for anastomotic leak, to OR for stent repositioning and pleural space drainage 12/4. Pt with persistent purulent drainage, concern for persistent leak; barium swallow 12/11 showed R>L leak within upper mediastinum. Other PMH includes radiation therapy (04/2022-06/2022).   PT Comments    Pt making incremental progress with mobility. Pt remains difficult to motivate; continued education efforts on importance of more frequent OOB mobility and activity progression. Pt only agreeable to perform bed<>recliner and recliner<>BSC transfers, able to do so with RW and min guard for balance. RN present to change saturated gauze at chest tube site. Pt remains limited by generalized weakness, decreased activity tolerance, and impaired balance strategies/postural reactions. Will continue to follow acutely to address established goals.    Recommendations for follow up therapy are one component of a multi-disciplinary discharge planning process, led by the attending physician.  Recommendations may be updated based on patient status, additional functional criteria and insurance authorization.  Follow Up Recommendations  Home health PT     Assistance Recommended at Discharge Intermittent Supervision/Assistance  Patient can return home with the following A little help with bathing/dressing/bathroom;Assistance with cooking/housework;Direct supervision/assist for medications management;Direct supervision/assist for financial management;Assist for transportation;Help with stairs or ramp for entrance;A little help with walking and/or  transfers   Equipment Recommendations  Other (comment) (TBD)    Recommendations for Other Services  Mobility Specialist     Precautions / Restrictions Precautions Precautions: Fall;Other (comment) Precaution Comments: R-side chest tube (drainage, RN present to replaced gauze) and JP drain, peg Restrictions Weight Bearing Restrictions: No     Mobility  Bed Mobility Overal bed mobility: Modified Independent                  Transfers Overall transfer level: Needs assistance Equipment used: Rolling walker (2 wheels) Transfers: Sit to/from Stand, Bed to chair/wheelchair/BSC Sit to Stand: Min guard   Step pivot transfers: Min guard       General transfer comment: pt reports does not need RW but suggested use due to first time up in a few days; able to stand and take a few pivotal steps from bed to recliner with RW and min guard; upon sitting, pt requesting BSC use, step pivot from recliner<>BSC leaving RW behind, close min guard due to instability    Ambulation/Gait               General Gait Details:  (pt declined)   Marine scientist Rankin (Stroke Patients Only)       Balance Overall balance assessment: Needs assistance Sitting-balance support: No upper extremity supported Sitting balance-Leahy Scale: Good Sitting balance - Comments: prolonged static sitting EOB while nurse changed saturated gauze at chest tube/drain site   Standing balance support: Single extremity supported, During functional activity Standing balance-Leahy Scale: Poor Standing balance comment: pt able to perform standing posterior pericare with single UE support on RW                            Cognition Arousal/Alertness: Awake/alert Behavior During Therapy: Flat affect  Overall Cognitive Status: Within Functional Limits for tasks assessed                                 General Comments: likes to know "the  plan." very flat affect (depressed?) requiring significant encouragement for minimal activity        Exercises      General Comments General comments (skin integrity, edema, etc.): pt's son [name] [name]) present and supportive. continued education efforts on importance of more frequent OOB mobility and activity progression; pt remains difficult to motivate      Pertinent Vitals/Pain Pain Assessment Pain Assessment: Faces Faces Pain Scale: Hurts little more Pain Location: generalized Pain Descriptors / Indicators: Tiring, Discomfort Pain Intervention(s): Monitored during session    Home Living                          Prior Function            PT Goals (current goals can now be found in the care plan section) Progress towards PT goals: Progressing toward goals    Frequency    Min 3X/week      PT Plan Current plan remains appropriate    Co-evaluation              AM-PAC PT "6 Clicks" Mobility   Outcome Measure  Help needed turning from your back to your side while in a flat bed without using bedrails?: A Little Help needed moving from lying on your back to sitting on the side of a flat bed without using bedrails?: A Little Help needed moving to and from a bed to a chair (including a wheelchair)?: A Little Help needed standing up from a chair using your arms (e.g., wheelchair or bedside chair)?: A Little Help needed to walk in hospital room?: A Lot Help needed climbing 3-5 steps with a railing? : A Lot 6 Click Score: 16    End of Session   Activity Tolerance: Patient limited by fatigue Patient left: in chair;with call bell/phone within reach;with family/visitor present Nurse Communication: Mobility status PT Visit Diagnosis: Other abnormalities of gait and mobility (R26.89);Muscle weakness (generalized) (M62.81)     Time: 6283-1517 PT Time Calculation (min) (ACUTE ONLY): 25 min  Charges:  $Therapeutic Activity: 23-37 mins                      [name] [name], PT, DPT Acute Rehabilitation Services  Personal: [facility] Office: [place] 09/27/2022, 1:22 PM

## 2022-09-27 NOTE — Progress Notes (Signed)
OT Cancellation Note  Patient Details Name: [name] [name] MRN: [id number] DOB: 11-11-1960   Cancelled Treatment:    Reason Eval/Treat Not Completed: Patient declined, no reason specified (asking OT to return tomorrow AM before procedure.)  Renaye Rakers, OTD, OTR/L SecureChat [facility]erred Acute Rehab [phone] - [place] 09/27/2022, 2:55 PM

## 2022-09-27 NOTE — Progress Notes (Signed)
Pharmacy Antibiotic Note  [name] [name] is a 61 y.o. male admitted on 09/23/2022 with  fever s/p esophagectomy for esophageal cancer .  Pharmacy has been consulted for vancomycin and Zosyn dosing. He is s/p esophagectomy on 57/8 complicated by anastomotic leak with stent placed on 11/13.   He was to have 6 weeks of abx/antifungal from 11/16 >> 12/28, initially with IV Zosyn and fluconazole. This was changed to Augmentin and oral fluconazole on discharge per ID (outpt course may need to change with new culture data). Pleural culture with E coli growing again, now resistant to Unasyn so will need alternative antibiotics at discharge. Cr stable, will continue Zosyn and adjust fluconazole to '400mg'$  IV daily (maintenance dose for intraabdominal and esophageal infections).  Plan: Continue Zosyn 3.375 g IV q8h to be infused over 4 hours Adjust fluconazole to '400mg'$  IV q24h  Height: '5\' 10"'$  (177.8 cm) Weight: 80.3 kg (177 lb 0.5 oz) IBW/kg (Calculated) : 73  Temp (24hrs), Avg:99.4 F (37.4 C), Min:98.5 F (36.9 C), Max:100.2 F (37.9 C)  Recent Labs  Lab 09/21/22 0358 09/23/22 0431 09/24/22 0326 09/25/22 0540 09/26/22 0445 09/27/22 0505  WBC 5.3  --   --  13.2* 14.4* 10.9*  CREATININE 0.62 0.49* 0.58*  --  0.58* 0.55*     Estimated Creatinine Clearance: 97.6 mL/min (A) (by C-G formula based on SCr of 0.55 mg/dL (L)).    No Known Allergies  Antimicrobials this admission: Vancomycin 12/1 >>12/9 Zosyn 12/1 >>  Fluconazole 12/1 >>  Microbiology results: 12/4 MRSA PCR neg 12/4 body fluid: E Coli - Zosyn Janeann Merl, PharmD, [place], [facility] Clinical Pharmacist [phone] Please check AMION for all [place] numbers 09/27/2022

## 2022-09-27 NOTE — Progress Notes (Addendum)
      [address]       [place] [address]             [phone]       8 Days Post-Op Procedure(s) (LRB): VIDEO ASSISTED THORACOSCOPY (Right) ESOPHAGOGASTRODUODENOSCOPY (EGD) (N/A)  Subjective: Patient resting this am. He is not in any pain, just feels weak  Objective: Vital signs in last 24 hours: Temp:  [98.5 F (36.9 C)-100.2 F (37.9 C)] 99.8 F (37.7 C) (12/12 0720) Pulse Rate:  [105-130] 111 (12/12 0720) Cardiac Rhythm: Sinus tachycardia (12/12 0712) Resp:  [22-25] 24 (12/12 0720) BP: (96-123)/(64-84) 107/67 (12/12 0720) SpO2:  [94 %-98 %] 95 % (12/12 0720)     Intake/Output from previous day: 12/11 0701 - 12/12 0700 In: 314.4 [NG/GT:225; IV Piggyback:89.4] Out: 365 [Urine:300; Drains:30; Chest Tube:35]   Physical Exam:  Cardiovascular: Tachycardic this am Pulmonary: Clear to auscultation on left and diminished on right base Abdomen: Soft, non tender, bowel sounds present. Extremities: No LE edema Wounds: J tube site clean and dry. Purulent like drainage around chest wound Right chest tube and JP drain: Chest tube to suction  Lab Results: CBC: Recent Labs    09/26/22 0445 09/27/22 0505  WBC 14.4* 10.9*  HGB 8.3* 8.1*  HCT 25.4* 24.5*  PLT 327 287    BMET:  Recent Labs    09/26/22 0445 09/27/22 0505  NA 127* 127*  K 4.2 4.7  CL 91* 92*  CO2 26 27  GLUCOSE 154* 152*  BUN 15 18  CREATININE 0.58* 0.55*  CALCIUM 8.5* 8.6*     PT/INR: No results for input(s): "LABPROT", "INR" in the last 72 hours. ABG:  INR: Will add last result for INR, ABG once components are confirmed Will add last 4 CBG results once components are confirmed  Assessment/Plan:  1. CV - SR, ST at times 2.  Pulmonary - On room air. Chest tube output with 35 cc recorded for 24 hours. Light purulent like color from chest tube. JP drain with 30 cc last 24 hours and is darkish (bloody like) in color.  Encourage incentive spirometer 3. CBGs 168/152/139.   No history of DM but on tube feedings. 4. GI-NPO. Tube feedings at 75 ml/hr. Imodium PRN diarrhea. Pre albumin 12/08 improved to 15. GI evaluated 12/11. Barium swallow study done yesterday showed right greater than left contrast leak within the upper mediastinum. Await GI recommendation this am within either the esophagus or gastric pull-through 5. Mild hyponatremia- He has had hyponatremia since admission. Stopped scheduled free water 12/11 but sodium remains 127 this am 6. Anemia of chronic disease- H and H this am stable at 8.1 and 24.5 7. ID-on Zosyn and Diflucan for esophageal anastomotic leak., E. Coli from right pleural fluid. WBC this am decreased from 14,400 to 10,900.  8. On Lovenox for DVT prophylaxis 9. Hyponatremia-sodium this am 127. Will discuss with Dr. Trichelle Lehan/nutrition 9. Deconditioned-PT/OT. Must ambulate  Donielle M ZimmermanPA-C 09/27/2022,7:37 AM     Agree with above Appreciate GI recs Continue abx, and chest tube management  Maloree Uplinger O Gavin Telford

## 2022-09-27 NOTE — Progress Notes (Signed)
Nutrition Follow-up  DOCUMENTATION CODES:   Severe malnutrition in context of chronic illness  INTERVENTION:   Continue tube feeds via J-tube: - Osmolite 1.5 @ 75 ml/hr (1800 ml/day) - PROSource TF20 60 ml daily  Tube feeding regimen provides 2780 kcal, 133 grams of protein, and 1372 ml of H2O.  Monitor hydration status. Pt currently only receiving 1.4 of free water from TF in addition to flushes with meds and tube maintenance. No additional free water flushes are ordered at this time. Suspect ongoing hyponatremia is related to GI losses rather than overhydration. Recommend considering sodium chloride tablets per tube in addition to resuming free water flushes.  NUTRITION DIAGNOSIS:   Severe Malnutrition related to chronic illness (esophageal cancer) as evidenced by moderate fat depletion, severe muscle depletion, percent weight loss (25.9% weight loss in less than 1 year).  New diagnosis after completion of NFPE  GOAL:   Patient will meet greater than or equal to 90% of their needs  Met via TF  MONITOR:   Labs, Weight trends, TF tolerance, Skin  REASON FOR ASSESSMENT:   Consult Enteral/tube feeding initiation and management  ASSESSMENT:   61 y.o. male admits related to SOB and generalized weakness. PMH includes esophageal cancer. Pt is currently receiving medical management for fevers and failure to thrive.  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 Per Dr. Abran Duke note, plan is for transfer to [facility] for endoscopic suturing.  Discussed pt with RN who reports pt tolerating tube feeds well.  No issues noted by RN.  Spoke with pt at bedside. Pt presents with depressed affect and is frustrated with prolonged hospitalization. Pt states that he is tolerating his tube feeds without issue. He notes some nausea a few days ago but reports that this has since resolved. Pt reports some bloating when tube feeds were resumed last night after EGD.  Pt with a 28 kg weight loss since 02/10/22. This is a 25.9% weight loss in less than 1 year which is severe and significant for timeframe. RD able to complete NFPE. Pt meets criteria for severe malnutrition.  Admit weight: 80.5 kg Most recent weight: 80.3 kg on 09/26/22  Current TF: Osmolite 1.5 @ 75 ml/hr, PROSource TF20 60 ml daily  Medications reviewed and include: nutrisource fiber BID, IV protonix, IV diflucan, IV abx  Labs from 09/27/22 reviewed: sodium 127, WBC 10.9, hemoglobin 8.1 CBG's: 130-263 x 24 hours  UOP: 1250 ml x 24 hours 19 Fr R chest JP drain 1: 0 ml x 24 hours 19 Fr R chest JP drain 2: 30 ml x 24 hours Chest tube: 50 ml x 24 hours I/O's: +1.6 L since admit  NUTRITION - FOCUSED PHYSICAL EXAM:  Flowsheet Row Most Recent Value  Orbital Region Moderate depletion  Upper Arm Region No depletion  Thoracic and Lumbar Region Severe depletion  Buccal Region Moderate depletion  Temple Region Moderate depletion  Clavicle Bone Region Severe depletion  Clavicle and Acromion Bone Region Severe depletion  Scapular Bone Region Moderate depletion  Dorsal Hand Moderate depletion  Patellar Region Moderate depletion  Anterior Thigh Region Severe depletion  Posterior Calf Region Moderate depletion  Edema (RD Assessment) None  Hair Reviewed  Eyes Reviewed  Mouth Reviewed  Skin Reviewed  Nails Reviewed       Diet Order:   Diet Order             Diet NPO time specified  Diet effective now                   EDUCATION NEEDS:   No education needs have been identified at this time  Skin:  Skin Assessment: Skin  Integrity Issues: Incisions: right chest x 3  Last BM:  09/27/22  Height:   Ht Readings from Last 1 Encounters:  10/03/2022 _0  (1.778 m)    Weight:   Wt Readings from Last 1 Encounters:  09/26/22 80.3 kg    BMI:  Body mass index is 25.4 kg/m.  Estimated Nutritional Needs:   Kcal:  1194-1740 kcals  Protein:  125-150 g  Fluid:  >/= 2 L    Gustavus Bryant, MS, RD, LDN Inpatient Clinical Dietitian Please see AMiON for contact information.

## 2022-09-27 NOTE — Progress Notes (Signed)
Progress Note   Subjective  Patient feels about the same. No acute issues overnight   Objective   Vital signs in last 24 hours: Temp:  [98.5 F (36.9 C)-100.2 F (37.9 C)] 98.8 F (37.1 C) (12/12 1100) Pulse Rate:  [103-130] 103 (12/12 1100) Resp:  [21-25] 21 (12/12 1100) BP: (96-123)/(64-84) 108/68 (12/12 1100) SpO2:  [94 %-98 %] 94 % (12/12 1100) Last BM Date : 09/24/22 General:    white male in NAD Neurologic:  Alert and oriented,  grossly normal neurologically. Psych:  Cooperative. Normal mood and affect.  Intake/Output from previous day: 12/11 0701 - 12/12 0700 In: 314.4 [NG/GT:225; IV Piggyback:89.4] Out: 365 [Urine:300; Drains:30; Chest Tube:35] Intake/Output this shift: Total I/O In: -  Out: 750 [Urine:750]  Lab Results: Recent Labs    09/25/22 0540 09/26/22 0445 09/27/22 0505  WBC 13.2* 14.4* 10.9*  HGB 8.5* 8.3* 8.1*  HCT 25.3* 25.4* 24.5*  PLT 320 327 287   BMET Recent Labs    09/26/22 0445 09/27/22 0505  NA 127* 127*  K 4.2 4.7  CL 91* 92*  CO2 26 27  GLUCOSE 154* 152*  BUN 15 18  CREATININE 0.58* 0.55*  CALCIUM 8.5* 8.6*   LFT No results for input(s): "PROT", "ALBUMIN", "AST", "ALT", "ALKPHOS", "BILITOT", "BILIDIR", "IBILI" in the last 72 hours. PT/INR No results for input(s): "LABPROT", "INR" in the last 72 hours.  Studies/Results: DG ESOPHAGUS W SINGLE CM (SOL OR THIN BA)  Result Date: 09/26/2022 CLINICAL DATA:  Patient history of Ivor Lewis esophagectomy on August 18, 2022 EGD. 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 Patient presents for single water-soluble contrast esophagram for further evaluation an anastomic leak EXAM: ESOPHAGUS/BARIUM SWALLOW/TABLET STUDY TECHNIQUE: Single contrast examination was performed using Omnipague 300. This exam was performed by Rushie Nyhan NP, and was supervised and interpreted by Dr. Abigail Miyamoto. FLUOROSCOPY: Radiation Exposure Index (as provided by the fluoroscopic device): 9.0 mGy Kerma COMPARISON:  Esophagram dated September 01, 2022 and CT chest abdomen pelvis dated August 28, 2022. FINDINGS: Focused, single-contrast exam performed with patient supine and in various obliquities. Initial swallows demonstrated the esophageal stent originates at the level of the clavicles. There is immediate contrast outpouching on the right side of the esophagus or gastric pull-through at the level of the carina including on image 37 of series 1. More subtle area of left-sided contrast outpouching also identified. At the level of the distal portion of the stent, contrast surrounds the stent and extends tracks minimally cephalad. IMPRESSION: Right greater than left contrast leak within the upper mediastinum, within either the esophagus or gastric pull-through. Contrast surrounding the distal stent is most likely within the gastric pull-through. No contrast extravasation at this site. Electronically Signed   By: Abigail Miyamoto M.D.   On: 09/26/2022 16:20   DG CHEST PORT 1 VIEW  Result Date: 09/26/2022 CLINICAL DATA:  Chest tube present.  Recent esophagectomy. EXAM: PORTABLE CHEST 1 VIEW COMPARISON:  09/25/22 CXR FINDINGS: Esophageal stent in place, unchanged in positioning and appearance compared to prior chest radiograph. Right-sided chest port with the tip at the cavoatrial junction. Right-sided thoracostomy tubes in place. No pneumothorax. No focal opacity seen in the left lung. Unchanged appearance of the right hemithorax with a persistent likely loculated right-sided pleural effusion. No new focal airspace opacity. IMPRESSION: 1.   Support apparatus as above. 2. No significant change from prior  exam with a persistent loculated right sided pleural effusion. Electronically Signed   By: Marin Roberts M.D.   On: 09/26/2022 08:12       Assessment / Plan:    61 y/o male with history of esophageal cancer, s/p  surgery June, XRT, complicated course postoperatively with an anastomotic leak. He has had EGD with stent placement per surgery on 08/29/22 and then VATS. Repeat EGD with stent repositioning on 10/03/2022 and another VATS. He continues to have purulent drainage, elevated WBC. Swallow study yesterday confirms he has a persistent leak. Discussed the findings with the patient and his son. Discussed his case with Dr. Rush Landmark of advanced endoscopy. Have offered the patient an EGD with Dr. Rush Landmark tomorrow afternoon to see if this can be sealed endoscopically, pending findings. Have discussed risks / benefits, he wishes to proceed. Please hold tube feeds at [place]. Further recommendations pending the results.  Jolly Mango, MD [facility]

## 2022-09-28 ENCOUNTER — Inpatient Hospital Stay (HOSPITAL_COMMUNITY): Payer: No Typology Code available for payment source | Admitting: Certified Registered Nurse Anesthetist

## 2022-09-28 ENCOUNTER — Inpatient Hospital Stay (HOSPITAL_COMMUNITY): Payer: No Typology Code available for payment source

## 2022-09-28 ENCOUNTER — Encounter (HOSPITAL_COMMUNITY)
Admission: AD | Disposition: E | Payer: Self-pay | Source: Ambulatory Visit | Attending: Thoracic Surgery (Cardiothoracic Vascular Surgery)

## 2022-09-28 ENCOUNTER — Encounter (HOSPITAL_COMMUNITY): Payer: Self-pay | Admitting: Thoracic Surgery (Cardiothoracic Vascular Surgery)

## 2022-09-28 DIAGNOSIS — Z4659 Encounter for fitting and adjustment of other gastrointestinal appliance and device: Secondary | ICD-10-CM | POA: Diagnosis not present

## 2022-09-28 DIAGNOSIS — K2289 Other specified disease of esophagus: Secondary | ICD-10-CM

## 2022-09-28 DIAGNOSIS — K316 Fistula of stomach and duodenum: Secondary | ICD-10-CM

## 2022-09-28 DIAGNOSIS — K3189 Other diseases of stomach and duodenum: Secondary | ICD-10-CM | POA: Diagnosis not present

## 2022-09-28 DIAGNOSIS — J9851 Mediastinitis: Secondary | ICD-10-CM

## 2022-09-28 HISTORY — PX: ESOPHAGOGASTRODUODENOSCOPY (EGD) WITH PROPOFOL: SHX5813

## 2022-09-28 HISTORY — PX: STENT REMOVAL: SHX6421

## 2022-09-28 HISTORY — PX: ESOPHAGEAL STENT PLACEMENT: SHX5540

## 2022-09-28 LAB — GLUCOSE, CAPILLARY
Glucose-Capillary: 110 mg/dL — ABNORMAL HIGH (ref 70–99)
Glucose-Capillary: 118 mg/dL — ABNORMAL HIGH (ref 70–99)
Glucose-Capillary: 130 mg/dL — ABNORMAL HIGH (ref 70–99)
Glucose-Capillary: 143 mg/dL — ABNORMAL HIGH (ref 70–99)
Glucose-Capillary: 146 mg/dL — ABNORMAL HIGH (ref 70–99)
Glucose-Capillary: 225 mg/dL — ABNORMAL HIGH (ref 70–99)

## 2022-09-28 SURGERY — ESOPHAGOGASTRODUODENOSCOPY (EGD) WITH PROPOFOL
Anesthesia: Monitor Anesthesia Care

## 2022-09-28 MED ORDER — PHENYLEPHRINE 80 MCG/ML (10ML) SYRINGE FOR IV PUSH (FOR BLOOD PRESSURE SUPPORT)
PREFILLED_SYRINGE | INTRAVENOUS | Status: DC | PRN
  Administered 2022-09-28: 160 ug via INTRAVENOUS
  Administered 2022-09-28: 240 ug via INTRAVENOUS

## 2022-09-28 MED ORDER — PROPOFOL 10 MG/ML IV BOLUS
INTRAVENOUS | Status: DC | PRN
  Administered 2022-09-28: 100 mg via INTRAVENOUS

## 2022-09-28 MED ORDER — DEXAMETHASONE SODIUM PHOSPHATE 10 MG/ML IJ SOLN
INTRAMUSCULAR | Status: DC | PRN
  Administered 2022-09-28: 5 mg via INTRAVENOUS

## 2022-09-28 MED ORDER — PROPOFOL 500 MG/50ML IV EMUL: INTRAVENOUS | Status: DC | PRN

## 2022-09-28 MED ORDER — SODIUM CHLORIDE 0.9 % IV SOLN
INTRAVENOUS | Status: DC | PRN
  Administered 2022-09-28: 11 mL

## 2022-09-28 MED ORDER — FENTANYL CITRATE (PF) 100 MCG/2ML IJ SOLN
INTRAMUSCULAR | Status: DC | PRN
  Administered 2022-09-28: 100 ug via INTRAVENOUS

## 2022-09-28 MED ORDER — PHENYLEPHRINE HCL-NACL 20-0.9 MG/250ML-% IV SOLN
INTRAVENOUS | Status: DC | PRN
  Administered 2022-09-28: 50 ug/min via INTRAVENOUS

## 2022-09-28 MED ORDER — EPHEDRINE SULFATE-NACL 50-0.9 MG/10ML-% IV SOSY
PREFILLED_SYRINGE | INTRAVENOUS | Status: DC | PRN
  Administered 2022-09-28: 5 mg via INTRAVENOUS

## 2022-09-28 MED ORDER — LACTATED RINGERS IV SOLN
INTRAVENOUS | Status: AC | PRN
  Administered 2022-09-28: 20 mL/h via INTRAVENOUS

## 2022-09-28 MED ORDER — ALBUMIN HUMAN 5 % IV SOLN: INTRAVENOUS | Status: DC | PRN

## 2022-09-28 MED ORDER — LIDOCAINE 2% (20 MG/ML) 5 ML SYRINGE
INTRAMUSCULAR | Status: DC | PRN
  Administered 2022-09-28: 60 mg via INTRAVENOUS

## 2022-09-28 MED ORDER — FENTANYL CITRATE (PF) 100 MCG/2ML IJ SOLN
INTRAMUSCULAR | Status: AC
  Filled 2022-09-28: qty 2

## 2022-09-28 MED ORDER — ROCURONIUM BROMIDE 10 MG/ML (PF) SYRINGE
PREFILLED_SYRINGE | INTRAVENOUS | Status: DC | PRN
  Administered 2022-09-28: 20 mg via INTRAVENOUS
  Administered 2022-09-28: 50 mg via INTRAVENOUS
  Administered 2022-09-28: 30 mg via INTRAVENOUS

## 2022-09-28 MED ORDER — SUGAMMADEX SODIUM 200 MG/2ML IV SOLN
INTRAVENOUS | Status: DC | PRN
  Administered 2022-09-28: 400 mg via INTRAVENOUS

## 2022-09-28 MED ORDER — ONDANSETRON HCL 4 MG/2ML IJ SOLN
INTRAMUSCULAR | Status: DC | PRN
  Administered 2022-09-28: 4 mg via INTRAVENOUS

## 2022-09-28 SURGICAL SUPPLY — 15 items

## 2022-09-28 NOTE — Op Note (Signed)
[facility] Patient Name: [name] [name] Procedure Date : 10/12/2022 MRN: [id number] Attending MD: Justice Britain , MD, [id number] Date of Birth: [birth year] CSN: [id number] Age: 61 Admit Type: Inpatient Procedure:                Upper GI endoscopy Indications:              Therapeutic procedure, Abnormal UGI series Providers:                Justice Britain, MD, Fanny Skates RN, RN, Benay Pillow, RN, William Dalton, Technician Referring MD:             Lajuana Matte Medicines:                General Anesthesia, Zosyn 8.546 g IV Complications:            No immediate complications. Estimated Blood Loss:     Estimated blood loss was minimal. Procedure:                Pre-Anesthesia Assessment:                           - Prior to the procedure, a History and Physical                            was performed, and patient medications and                            allergies were reviewed. The patient's tolerance of                            previous anesthesia was also reviewed. The risks                            and benefits of the procedure and the sedation                            options and risks were discussed with the patient.                            All questions were answered, and informed consent                            was obtained. Prior Anticoagulants: The patient has                            taken no anticoagulant or antiplatelet agents. ASA                            Grade Assessment: III - A patient with severe                            systemic disease. After reviewing the risks and  benefits, the patient was deemed in satisfactory                            condition to undergo the procedure.                           After obtaining informed consent, the endoscope was                            passed under direct vision. Throughout the                            procedure,  the patient's blood pressure, pulse, and                            oxygen saturations were monitored continuously. The                            [id number] ([id number]) Olympus endoscope was                            introduced through the mouth, and advanced to the                            second part of duodenum. The upper GI endoscopy was                            accomplished without difficulty. The patient                            tolerated the procedure. Scope In: Scope Out: Findings:      An esophageal stent was found in the esophagus (proximal aspect at about       23 cm). There was some concern that the proximal aspect of the stent was       not covering a potential defect in the left area. Stent removal was       accomplished with a Raptor grasping device.      It was clear after this, that there was a smaller 18 mm fistula found       form 20-22 cm.      Going distally, a greater than 40 mm by 15 mm fistula was found in the       proximal esophagus found from 26-30 cm into the anastomosis. This had       evidence of necrosis and this was cleaned out with       washing/suctioning/debridement with raptor forceps. I was able to       visualize the chest tube in the right region.      An esophago-gastric anastomosis was found at 26-28 cm (within the area       noted above).      A 20 mm fistula was found just distal to this area of the anastomosis       approximately 34 cm from the incisors.      Patchy moderately erythematous mucosa without bleeding was found in the       entire examined stomach.  No gross lesions were noted in the duodenal bulb, in the first portion       of the duodenum and in the second portion of the duodenum.      At the end of the procedure, I replaced an esophageal fully covered       stent after using a 0.035 inch x 450 cm straight Hydra Jagwire was       attempted. This passed successfully. This was stented with a 23 mm x       12.5 cm  WallFlex covered stent under fluoroscopic guidance (15 cm stent       would likely still be too short but could be considered for upsizing). Impression:               - Pre-existing esophageal stent, removed.                           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                           - Larger fistula just above and including the                            anastomosis that is 4 cm by 1.5 cm in size. This                            had been covered by the previous stent however.                           - An esophago-gastric anastomosis was found.                           - Below the anastomosis is a potential third                            fistulous connection.                           - Erythematous mucosa in the stomach.                           - No gross lesions in the duodenal bulb, in the                            first portion of the duodenum and in the second                            portion of the duodenum.                           - At end of procedure, esophageal prosthesis placed. Recommendation:           - The patient will be observed post-procedure,  until all discharge criteria are met.                           - Return patient to hospital ward for observation.                           - NPO.                           - Observe patient's clinical course.                           - WIll need further discussions with Thoracic                            Surgery in regards to possible other interventions                            that could be required or the potential of                            endoscopic suturing or endoscopic sponge/vaccuum                            therapy. These interventions will likely need to be                            considered at a quaternary center. Consideration of                            OVESCO  clipping the proximal fistulous connection                            and the distal connection while still dealing with                            the larger middle fistulous connection may be                            possible, but not clear this will solve his larger                            issue unfortunately, since biggest disease is in                            the right mediastinum.                           - The findings and recommendations were discussed                            with the patient.                           - The findings and recommendations were  discussed                            with the referring physician. Procedure Code(s):        --- Professional ---                           [phone], Esophagogastroduodenoscopy, flexible,                            transoral; with placement of endoscopic stent                            (includes pre- and post-dilation and guide wire                            passage, when performed)                           [id number], Esophagogastroduodenoscopy, flexible,                            transoral; with removal of foreign body(s) Diagnosis Code(s):        --- Professional ---                           Z97.8, Presence of other specified devices                           K22.89, Other specified disease of esophagus                           Z98.890, Other specified postprocedural states                           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 All rights reserved. The codes documented in this report are preliminary and upon coder review may  be revised to meet current compliance requirements. Justice Britain, MD 10/01/2022 4:29:47 PM Number of Addenda: 0

## 2022-09-28 NOTE — Anesthesia Procedure Notes (Signed)
Procedure Name: Intubation Date/Time: 10/04/2022 2:13 PM  Performed by: Dorthea Cove, CRNAPre-anesthesia Checklist: Patient identified, Emergency Drugs available, Suction available and Patient being monitored Patient Re-evaluated:Patient Re-evaluated prior to induction Oxygen Delivery Method: Circle system utilized Preoxygenation: Pre-oxygenation with 100% oxygen Induction Type: IV induction Ventilation: Mask ventilation without difficulty Laryngoscope Size: Mac and 4 Grade View: Grade I Tube type: Oral Tube size: 7.5 mm Number of attempts: 1 Airway Equipment and Method: Stylet and Oral airway Placement Confirmation: ETT inserted through vocal cords under direct vision, positive ETCO2 and breath sounds checked- equal and bilateral Secured at: 23 cm Tube secured with: Tape Dental Injury: Teeth and Oropharynx as per pre-operative assessment

## 2022-09-28 NOTE — Progress Notes (Signed)
Mobility Specialist Progress Note   10/09/2022 0930  Mobility  Activity Ambulated with assistance in room  Level of Assistance Contact guard assist, steadying assist  Assistive Device Front wheel walker  Distance Ambulated (ft) 15 ft  Range of Motion/Exercises Active;All extremities  Activity Response Tolerated well   Pre Ambulation:  HR 119, BP 98/68,  SpO2 99% During Ambulation: HR 145, SpO2 92% Post Ambulation: HR 117, SpO2 93%  Patient received in supine, asleep and easily aroused. Reluctant to participate but eventually agreed with education and max encouragement. Was independent for bed mobility and required light min A to stand. Ambulated short distance in room at min guard with slow gait and reduced stride length. Needed cueing to take longer steps to increase stride length but was not able to. Distance limited secondary to fatigue, SOB and elevated HR. HR peaked at 145 and quickly drops with rest. Returned to bed without complaint or incident. Was left in supine with all needs met, call bell in reach.   [name] [name], BS EXP Mobility Specialist Please contact via SecureChat or Rehab office at [phone]

## 2022-09-28 NOTE — Progress Notes (Signed)
Occupational Therapy Treatment Patient Details Name: [name] [name] MRN: [id number] DOB: 1961-04-20 Today's Date: 10/04/2022   History of present illness Pt is a 61 y.o. 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 Workup for anastomotic leak, to OR for stent repositioning and pleural space drainage 12/4. Pt with persistent purulent drainage, concern for persistent leak; barium swallow 12/11 showed R>L leak within upper mediastinum. Other PMH includes radiation therapy (04/2022-06/2022).   OT comments  Jourdyn is making incremental progress but remains limited by general malaise, poor activity tolerance and chest tube drainage. He requires maximal encouragement to participate but agreeable to mobilize OOB. Pt anxious because his "pulse went too high" after walking this morning. Overall he is min G for transfers and functional mobility with RW. OT to continue to follow acutely. D/c recommendation is appropriate.    Recommendations for follow up therapy are one component of a multi-disciplinary discharge planning process, led by the attending physician.  Recommendations may be updated based on patient status, additional functional criteria and insurance authorization.    Follow Up Recommendations  Home health OT     Assistance Recommended at Discharge Frequent or constant Supervision/Assistance  Patient can return home with the following  A little help with walking and/or transfers;A little help with bathing/dressing/bathroom;Assistance with cooking/housework;Assist for transportation;Help with stairs or ramp for entrance   Equipment Recommendations  Other (comment)       Precautions / Restrictions Precautions Precautions: Fall;Other (comment) Precaution Comments: R-side chest tube (drainage, RN present to replaced gauze) and JP  drain, peg Restrictions Weight Bearing Restrictions: No       Mobility Bed Mobility Overal bed mobility: Modified Independent             General bed mobility comments: encouraged family not to assist, allow pt to complete himself    Transfers Overall transfer level: Needs assistance Equipment used: Rolling walker (2 wheels) Transfers: Sit to/from Stand, Bed to chair/wheelchair/BSC Sit to Stand: Min guard     Step pivot transfers: Min guard           Balance Overall balance assessment: Needs assistance Sitting-balance support: No upper extremity supported Sitting balance-Leahy Scale: Good     Standing balance support: Single extremity supported, During functional activity Standing balance-Leahy Scale: Poor                             ADL either performed or assessed with clinical judgement   ADL Overall ADL's : Needs assistance/impaired Eating/Feeding: NPO                       Toilet Transfer: Min guard;Stand-pivot;Rolling walker (2 wheels);BSC/3in1   Toileting- Clothing Manipulation and Hygiene: Min guard;Sitting/lateral lean       Functional mobility during ADLs: Min guard;Rolling walker (2 wheels) General ADL Comments: self-limiting to short in room mobility.    Extremity/Trunk Assessment Upper Extremity Assessment Upper Extremity Assessment: Generalized weakness   Lower Extremity Assessment Lower Extremity Assessment: Generalized weakness        Vision   Vision Assessment?: No apparent visual deficits   Perception Perception Perception: Within Functional Limits   Praxis Praxis Praxis: Intact    Cognition Arousal/Alertness: Awake/alert Behavior During Therapy: Flat affect Overall Cognitive Status: Within Functional Limits for tasks assessed  General Comments: continues to present with flat affect and requried cues for encouragement.              General Comments  pt's son arrived at the end of the session - continue to encourage frequent mobility    Pertinent Vitals/ Pain       Pain Assessment Pain Assessment: Faces Faces Pain Scale: Hurts a little bit Pain Location: generalized Pain Descriptors / Indicators: Tiring, Discomfort Pain Intervention(s): Limited activity within patient's tolerance, Monitored during session   Frequency  Min 2X/week        Progress Toward Goals  OT Goals(current goals can now be found in the care plan section)  Progress towards OT goals: Progressing toward goals  Acute Rehab OT Goals Patient Stated Goal: to feel better OT Goal Formulation: With patient Time For Goal Achievement: 10/07/22 Potential to Achieve Goals: Good ADL Goals Pt Will Perform Lower Body Bathing: with supervision;sit to/from stand Pt Will Perform Lower Body Dressing: with supervision;sit to/from stand Pt Will Transfer to Toilet: with supervision;ambulating Pt/caregiver will Perform Home Exercise Program: Both right and left upper extremity;With theraband;With written HEP provided;Independently Additional ADL Goal #1: Pt will verbalize 3 strateiges for Energy conservation Additional ADL Goal #2: Pt will verbalize 3 strategies to reduce risk of falls  Plan Discharge plan remains appropriate       AM-PAC OT "6 Clicks" Daily Activity     Outcome Measure   Help from another person eating meals?: Total Help from another person taking care of personal grooming?: A Little Help from another person toileting, which includes using toliet, bedpan, or urinal?: A Little Help from another person bathing (including washing, rinsing, drying)?: A Little Help from another person to put on and taking off regular upper body clothing?: A Little Help from another person to put on and taking off regular lower body clothing?: A Little 6 Click Score: 16    End of Session Equipment Utilized During Treatment: Rolling walker (2 wheels)  OT Visit Diagnosis:  Unsteadiness on feet (R26.81);Other abnormalities of gait and mobility (R26.89);Muscle weakness (generalized) (M62.81);Pain   Activity Tolerance Patient limited by fatigue   Patient Left in bed;with call bell/phone within reach   Nurse Communication Mobility status        Time: 0349-1791 OT Time Calculation (min): 18 min  Charges: OT General Charges $OT Visit: 1 Visit OT Treatments $Therapeutic Activity: 8-22 mins    Elliot Cousin 09/25/2022, 1:36 PM

## 2022-09-28 NOTE — Progress Notes (Addendum)
      [address]       [place] [address]             [phone]       9 Days Post-Op Procedure(s) (LRB): VIDEO ASSISTED THORACOSCOPY (Right) ESOPHAGOGASTRODUODENOSCOPY (EGD) (N/A)  Subjective: Patient awaiting this am's GI procedure  Objective: Vital signs in last 24 hours: Temp:  [98.8 F (37.1 C)-100.9 F (38.3 C)] 100 F (37.8 C) (12/13 0308) Pulse Rate:  [103-121] 113 (12/13 0308) Cardiac Rhythm: Sinus tachycardia (12/13 0308) Resp:  [19-27] 24 (12/13 0308) BP: (102-108)/(65-71) 102/67 (12/13 0308) SpO2:  [94 %-96 %] 95 % (12/13 0308)     Intake/Output from previous day: 12/12 0701 - 12/13 0700 In: 200 [IV Piggyback:200] Out: 0034 [Urine:1050; Drains:2; Chest Tube:75]   Physical Exam:  Cardiovascular: Tachycardic this am Pulmonary: Clear to auscultation on left and diminished on right base Abdomen: Soft, non tender, bowel sounds present. Extremities: No LE edema Wounds: J tube site clean and dry. Chest tube wound mostly clean and dry. Right lateral wound with purulent drainage distally with superficial dehiscence. Dressing changed this am Right chest tube and JP drain: Chest tube to suction  Lab Results: CBC: Recent Labs    09/26/22 0445 09/27/22 0505  WBC 14.4* 10.9*  HGB 8.3* 8.1*  HCT 25.4* 24.5*  PLT 327 287    BMET:  Recent Labs    09/26/22 0445 09/27/22 0505  NA 127* 127*  K 4.2 4.7  CL 91* 92*  CO2 26 27  GLUCOSE 154* 152*  BUN 15 18  CREATININE 0.58* 0.55*  CALCIUM 8.5* 8.6*     PT/INR: No results for input(s): "LABPROT", "INR" in the last 72 hours. ABG:  INR: Will add last result for INR, ABG once components are confirmed Will add last 4 CBG results once components are confirmed  Assessment/Plan:  1. CV - Mostly ST 2.  Pulmonary - On room air. Chest tube output with 75 cc recorded for 24 hours. Light purulent like color from chest tube. JP drain with scant output last 24 hours and is darkish (bloody  like) in color.  Encourage incentive spirometer 3. CBGs 168/157/118.  No history of DM but on tube feedings. 4. GI-NPO. Tube feedings at 75 ml/hr. Imodium PRN diarrhea. Pre albumin 12/08 improved to 15. GI evaluated 12/11. GI to do EGD within either the esophagus or gastric pull-through 5. Mild hyponatremia- He has had hyponatremia since admission. Stopped scheduled free water 12/11 but sodium remains 127 this am 6. Anemia of chronic disease- Last H and H stable at 8.1 and 24.5 7. ID-on Zosyn and Diflucan for esophageal anastomotic leak., E. Coli from right pleural fluid. Last WBC  decreased from 14,400 to 10,900.  8. On Lovenox for DVT prophylaxis 9. Hyponatremia-sodium this am 127. Will discuss with Dr. Tenise Stetler/nutrition 9. Deconditioned-PT/OT. Must ambulate  [name] M ZimmermanPA-C 10/13/2022,7:02 AM     Agree with above EGD today  Lajuana Matte

## 2022-09-28 NOTE — Anesthesia Preprocedure Evaluation (Addendum)
Anesthesia Evaluation  Patient identified by MRN, date of birth, ID band Patient awake    Reviewed: Allergy & Precautions, NPO status , Patient's Chart, lab work & pertinent test results  History of Anesthesia Complications Negative for: history of anesthetic complications  Airway Mallampati: II  TM Distance: >3 FB Neck ROM: Full    Dental no notable dental hx.    Pulmonary neg pulmonary ROS   Pulmonary exam normal        Cardiovascular negative cardio ROS Normal cardiovascular exam     Neuro/Psych negative neurological ROS  negative psych ROS   GI/Hepatic Neg liver ROS,,,Esophageal ca s/p esophagectomy, now with anastomotic leak   Endo/Other  negative endocrine ROS    Renal/GU negative Renal ROS  negative genitourinary   Musculoskeletal negative musculoskeletal ROS (+)    Abdominal   Peds  Hematology negative hematology ROS (+)   Anesthesia Other Findings Day of surgery medications reviewed with patient.  Reproductive/Obstetrics negative OB ROS                             Anesthesia Physical Anesthesia Plan  ASA: 3  Anesthesia Plan: General   Post-op Pain Management: Minimal or no pain anticipated   Induction: Intravenous  PONV Risk Score and Plan: 2 and Treatment may vary due to age or medical condition, Ondansetron, Dexamethasone and Midazolam  Airway Management Planned: Oral ETT  Additional Equipment: None  Intra-op Plan:   Post-operative Plan: Extubation in OR  Informed Consent: I have reviewed the patients History and Physical, chart, labs and discussed the procedure including the risks, benefits and alternatives for the proposed anesthesia with the patient or authorized representative who has indicated his/her understanding and acceptance.     Dental advisory given  Plan Discussed with: CRNA  Anesthesia Plan Comments:        Anesthesia Quick  Evaluation

## 2022-09-28 NOTE — Anesthesia Postprocedure Evaluation (Signed)
Anesthesia Post Note  Patient: Calven Gilkes Marcell  Procedure(s) Performed: ESOPHAGOGASTRODUODENOSCOPY (EGD) WITH PROPOFOL ESOPHAGEAL STENT PLACEMENT STENT REMOVAL     Patient location during evaluation: PACU Anesthesia Type: General Level of consciousness: awake and alert Pain management: pain level controlled Vital Signs Assessment: post-procedure vital signs reviewed and stable Respiratory status: spontaneous breathing, nonlabored ventilation, respiratory function stable and patient connected to nasal cannula oxygen Cardiovascular status: blood pressure returned to baseline and stable Postop Assessment: no apparent nausea or vomiting Anesthetic complications: no   No notable events documented.  Last Vitals:  Vitals:   09/29/2022 1640 10/09/2022 1700  BP: 101/66 102/66  Pulse: 98   Resp: (!) 21 (!) 21  Temp:    SpO2: 96% 96%    Last Pain:  Vitals:   10/08/2022 1700  TempSrc:   PainSc: 0-No pain                 Belenda Cruise P Tashara Suder

## 2022-09-28 NOTE — Progress Notes (Signed)
On [facility] MD  Called by Mr. Baldree's bedside nurse because she did not receive any orders following the patient's EGD with esophageal stent exchange. Dr. Donneta Romberg procedure note reviewed.   Recommend: - Continue NPO status - Hold enteral feeds until tomorrow and plan is clarified - If pain medications are needed, would recommend that these be converted to IV - Resume IVF until enteral feeds are resumed   I am available for any additional questions or concerns tonight. The inpatient GI team will see Mr. Band in the morning.

## 2022-09-28 NOTE — Transfer of Care (Signed)
Immediate Anesthesia Transfer of Care Note  Patient: [name] [name]  Procedure(s) Performed: ESOPHAGOGASTRODUODENOSCOPY (EGD) WITH PROPOFOL ESOPHAGEAL STENT PLACEMENT STENT REMOVAL  Patient Location: Endoscopy Unit  Anesthesia Type:General  Level of Consciousness: awake, alert , and oriented  Airway & Oxygen Therapy: Patient Spontanous Breathing  Post-op Assessment: Report given to RN and Post -op Vital signs reviewed and stable  Post vital signs: Reviewed and stable  Last Vitals:  Vitals Value Taken Time  BP 99/62 10/05/2022 1604  Temp 36.1 C 10/15/2022 1602  Pulse 98 10/10/2022 1606  Resp 23 09/22/2022 1606  SpO2 94 % 10/08/2022 1606  Vitals shown include unvalidated device data.  Last Pain:  Vitals:   09/18/2022 1602  TempSrc: Temporal  PainSc: 0-No pain      Patients Stated Pain Goal: 0 (45/62/56 3893)  Complications: No notable events documented.

## 2022-09-28 NOTE — Progress Notes (Signed)
Per Dr Kipp Brood, ok to resume all Peg Tube meds and tube feed at currently ordered doses.

## 2022-09-28 NOTE — Interval H&P Note (Signed)
History and Physical Interval Note:  09/22/2022 2:04 PM  [name] [name]  has presented today for surgery, with the diagnosis of s/p esophagectomy with anastomotic leak.  The various methods of treatment have been discussed with the patient and family. After consideration of risks, benefits and other options for treatment, the patient has consented to  Procedure(s) with comments: ESOPHAGOGASTRODUODENOSCOPY (EGD) WITH PROPOFOL (N/A) - with probable esophageal stent removal and replacement with covered esophageal stent ESOPHAGEAL STENT PLACEMENT (N/A) as a surgical intervention.  The patient's history has been reviewed, patient examined, no change in status, stable for surgery.  I have reviewed the patient's chart and labs.  Questions were answered to the patient's satisfaction.     [employer]

## 2022-09-29 ENCOUNTER — Inpatient Hospital Stay (HOSPITAL_COMMUNITY): Payer: No Typology Code available for payment source

## 2022-09-29 LAB — GLUCOSE, CAPILLARY
Glucose-Capillary: 180 mg/dL — ABNORMAL HIGH (ref 70–99)
Glucose-Capillary: 225 mg/dL — ABNORMAL HIGH (ref 70–99)
Glucose-Capillary: 236 mg/dL — ABNORMAL HIGH (ref 70–99)
Glucose-Capillary: 241 mg/dL — ABNORMAL HIGH (ref 70–99)
Glucose-Capillary: 241 mg/dL — ABNORMAL HIGH (ref 70–99)
Glucose-Capillary: 263 mg/dL — ABNORMAL HIGH (ref 70–99)

## 2022-09-29 MED ORDER — ORAL CARE MOUTH RINSE
15.0000 mL | OROMUCOSAL | Status: DC
  Administered 2022-09-30 (×3): 15 mL via OROMUCOSAL

## 2022-09-29 MED ORDER — ORAL CARE MOUTH RINSE: 15.0000 mL | OROMUCOSAL | Status: DC | PRN

## 2022-09-29 NOTE — Progress Notes (Addendum)
      [address]       [place] [address]             [phone]       1 Day Post-Op Procedure(s) (LRB): ESOPHAGOGASTRODUODENOSCOPY (EGD) WITH PROPOFOL (N/A) ESOPHAGEAL STENT PLACEMENT (N/A) STENT REMOVAL  Subjective: Patient awaiting to talk with GI and Dr. Kipp Brood as to the next steps for his care  Objective: Vital signs in last 24 hours: Temp:  [97 F (36.1 C)-99.8 F (37.7 C)] 97.8 F (36.6 C) (12/14 0317) Pulse Rate:  [79-120] 79 (12/14 0317) Cardiac Rhythm: Normal sinus rhythm (12/13 1905) Resp:  [16-28] 19 (12/14 0317) BP: (93-112)/(62-74) 93/63 (12/14 0317) SpO2:  [93 %-98 %] 98 % (12/14 0317)     Intake/Output from previous day: 12/13 0701 - 12/14 0700 In: 950 [I.V.:700; IV Piggyback:250] Out: 1330 [Urine:1250; Drains:30; Chest Tube:50]   Physical Exam:  Cardiovascular: RRR Pulmonary: Clear to auscultation on left and diminished on right base Abdomen: Soft, non tender, bowel sounds present. Extremities: No LE edema Wounds: J tube site clean and dry. Chest tube wound mostly clean and dry. Right lateral wound with purulent drainage distally with superficial dehiscence.  Right chest tube and JP drain: Chest tube to suction  Lab Results: CBC: Recent Labs    09/27/22 0505  WBC 10.9*  HGB 8.1*  HCT 24.5*  PLT 287    BMET:  Recent Labs    09/27/22 0505  NA 127*  K 4.7  CL 92*  CO2 27  GLUCOSE 152*  BUN 18  CREATININE 0.55*  CALCIUM 8.6*     PT/INR: No results for input(s): "LABPROT", "INR" in the last 72 hours. ABG:  INR: Will add last result for INR, ABG once components are confirmed Will add last 4 CBG results once components are confirmed  Assessment/Plan:  1. CV - Previously, ST. SR this am 2.  Pulmonary - On room air. Chest tube output with 50 cc recorded for 24 hours. Light purulent like color from chest tube. JP drain with 30 cc output last 12 hours and is mostly darkish (bloody like) in color.  Encourage  incentive spirometer 3. CBGs 143/225/263.  No history of DM but on tube feedings. 4. GI-NPO. Tube feedings at 75 ml/hr. Imodium PRN diarrhea. Pre albumin 12/08 improved to 15. S/p upper GI endoscopy. Pre existing esophageal stent removed, per report, multiple fistulas. Esophageal prosthesis placed. GI to discuss with Dr. Kipp Brood regarding further interventions. 5. Mild hyponatremia- He has had hyponatremia since admission. Stopped scheduled free water 12/11 but sodium remains 127 this am 6. Anemia of chronic disease- Last H and H stable at 8.1 and 24.5 7. ID-on Zosyn and Diflucan for esophageal anastomotic leak., E. Coli from right pleural fluid. Last WBC decreased to 10,900.  8. On Lovenox for DVT prophylaxis 9. Hyponatremia-Last sodium 127.  9. Deconditioned-PT/OT. Must ambulate  Donielle M ZimmermanPA-C 09/29/2022,6:55 AM   Agree with above Will plan for transfer to [facility] for endoscopic suturing Will continue chest tube drainage, and abx for now  [employer]

## 2022-09-29 NOTE — Progress Notes (Signed)
PT Cancellation Note  Patient Details Name: [name] [name] MRN: [id number] DOB: 03/22/1961   Cancelled Treatment:    Reason Eval/Treat Not Completed: Patient declined, no reason specified. PT introduced self upon arrival and pt immediately stating "we are not doing anything today". Asked pt to explain with him reporting he is "just not in the right head space for it today" as he got some unsettling news that he may be in the hospital "until mid January". Educated pt on his limited mobility so far per chart review and the risk of further deconditioning with prolonged immobility. Pt stating "I will get by" when encouraged to mobilize more to prepare him for desired d/c home once medically ready. Pt crying in regards to situation and not desiring to mobilize with therapy today, even though already verbally confirmed we could hold off on therapy today. Educated pt to try to find motivation to participate in future sessions to ensure progress and physical readiness upon d/c. Pt agreeable to therapy tomorrow. Will plan to follow-up tomorrow per pt request.   Moishe Spice, PT, DPT Acute Rehabilitation Services  Office: [place] 09/29/2022, 2:01 PM

## 2022-09-30 ENCOUNTER — Encounter (HOSPITAL_COMMUNITY): Payer: Self-pay

## 2022-09-30 ENCOUNTER — Ambulatory Visit: Payer: No Typology Code available for payment source | Admitting: Thoracic Surgery (Cardiothoracic Vascular Surgery)

## 2022-09-30 DIAGNOSIS — K9189 Other postprocedural complications and disorders of digestive system: Secondary | ICD-10-CM | POA: Diagnosis not present

## 2022-09-30 DIAGNOSIS — E43 Unspecified severe protein-calorie malnutrition: Secondary | ICD-10-CM | POA: Insufficient documentation

## 2022-09-30 LAB — GLUCOSE, CAPILLARY
Glucose-Capillary: 153 mg/dL — ABNORMAL HIGH (ref 70–99)
Glucose-Capillary: 167 mg/dL — ABNORMAL HIGH (ref 70–99)
Glucose-Capillary: 167 mg/dL — ABNORMAL HIGH (ref 70–99)
Glucose-Capillary: 177 mg/dL — ABNORMAL HIGH (ref 70–99)
Glucose-Capillary: 202 mg/dL — ABNORMAL HIGH (ref 70–99)
Glucose-Capillary: 205 mg/dL — ABNORMAL HIGH (ref 70–99)

## 2022-09-30 LAB — CREATININE, SERUM
Creatinine, Ser: 0.54 mg/dL — ABNORMAL LOW (ref 0.61–1.24)
GFR, Estimated: >60 mL/min (ref >60)

## 2022-09-30 MED ORDER — HYDROCODONE-ACETAMINOPHEN 7.5-325 MG/15ML PO SOLN: 10.0000 mL | ORAL | 0 refills | Status: AC | PRN

## 2022-09-30 MED ORDER — LOPERAMIDE HCL 1 MG/7.5ML PO SUSP: 2.0000 mg | ORAL | 0 refills | Status: AC | PRN

## 2022-09-30 MED ORDER — NUTRISOURCE FIBER PO PACK: 1.0000 | PACK | Freq: Two times a day (BID) | ORAL | Status: AC

## 2022-09-30 MED ORDER — FREE WATER
200.0000 mL | Freq: Four times a day (QID) | Status: DC
  Administered 2022-09-30 – 2022-10-04 (×17): 200 mL

## 2022-09-30 MED ORDER — ONDANSETRON HCL 4 MG/2ML IJ SOLN: 4.0000 mg | Freq: Four times a day (QID) | INTRAMUSCULAR | 0 refills | Status: AC | PRN

## 2022-09-30 MED ORDER — FREE WATER: 200.0000 mL | Freq: Four times a day (QID) | Status: DC

## 2022-09-30 MED ORDER — OSMOLITE 1.5 CAL PO LIQD: 1000.0000 mL | ORAL | 0 refills | Status: AC

## 2022-09-30 MED ORDER — ORAL CARE MOUTH RINSE
15.0000 mL | OROMUCOSAL | Status: DC
  Administered 2022-10-01 – 2022-11-06 (×101): 15 mL via OROMUCOSAL

## 2022-09-30 MED ORDER — SODIUM CHLORIDE 1 G PO TABS
1.0000 g | ORAL_TABLET | Freq: Two times a day (BID) | ORAL | Status: DC
  Filled 2022-09-30: qty 1

## 2022-09-30 MED ORDER — ORAL CARE MOUTH RINSE: 15.0000 mL | OROMUCOSAL | Status: DC | PRN

## 2022-09-30 MED ORDER — PANTOPRAZOLE SODIUM 40 MG IV SOLR: 40.0000 mg | INTRAVENOUS | Status: AC

## 2022-09-30 MED ORDER — PIPERACILLIN-TAZOBACTAM 3.375 G IVPB: 3.3750 g | Freq: Three times a day (TID) | INTRAVENOUS | Status: DC

## 2022-09-30 NOTE — Progress Notes (Addendum)
      [address]       [place] [address]             [phone]       2 Days Post-Op Procedure(s) (LRB): ESOPHAGOGASTRODUODENOSCOPY (EGD) WITH PROPOFOL (N/A) ESOPHAGEAL STENT PLACEMENT (N/A) STENT REMOVAL  Subjective: Patient resting and briefly awakened  Objective: Vital signs in last 24 hours: Temp:  [97.6 F (36.4 C)-97.9 F (36.6 C)] 97.7 F (36.5 C) (12/15 0502) Pulse Rate:  [73-89] 73 (12/15 0502) Cardiac Rhythm: Normal sinus rhythm (12/14 1911) Resp:  [17-20] 20 (12/15 0502) BP: (96-107)/(62-69) 99/69 (12/15 0502) SpO2:  [96 %-99 %] 99 % (12/15 0502) Weight:  [79.1 kg] 79.1 kg (12/15 0649)     Intake/Output from previous day: 12/14 0701 - 12/15 0700 In: 3635.6 [NG/GT:2675; IV Piggyback:840.6] Out: 150 [Chest Tube:150]   Physical Exam:  Cardiovascular: RRR Pulmonary: Clear to auscultation on left and diminished on right base Abdomen: Soft, non tender, bowel sounds present. Extremities: No LE edema Wounds: J tube site clean and dry. Chest tube wound mostly clean and dry. Right lateral wound with purulent drainage distally with superficial dehiscence. Dressing is fairly dry this am (has not been changed since last shift) as opposed to previous days it was saturated. Right chest tube and JP drain: Chest tube to suction  Lab Results: CBC: No results for input(s): "WBC", "HGB", "HCT", "PLT" in the last 72 hours.  BMET:  Recent Labs    09/30/22 0535  CREATININE 0.54*     PT/INR: No results for input(s): "LABPROT", "INR" in the last 72 hours. ABG:  INR: Will add last result for INR, ABG once components are confirmed Will add last 4 CBG results once components are confirmed  Assessment/Plan:  1. CV - Previously, ST. SR this am 2.  Pulmonary - On room air. Chest tube output with 150 cc recorded for 24 hours. Light purulent like color from chest tube. JP drain with scant output last 24 hours.  Encourage incentive spirometer 3. CBGs  241/225/205.  No history of DM but on tube feedings. 4. GI-NPO. Tube feedings at 75 ml/hr. Imodium PRN diarrhea. Pre albumin 12/08 improved to 15. S/p upper GI endoscopy. Pre existing esophageal stent removed, per report, multiple fistulas. Esophageal prosthesis placed.  5. Mild hyponatremia- He has had hyponatremia since admission. Stopped scheduled free water 12/11 but sodium remains elevated. Appreciate nutrition's assistance. Will resume free water but not give sodium chloride tablets per tube (may clog tube). Re check BMET in am 6. Anemia of chronic disease- Last H and H stable at 8.1 and 24.5 7. ID-on Zosyn and Diflucan for esophageal anastomotic leak., E. Coli from right pleural fluid. Last WBC decreased to 10,900.  8. On Lovenox for DVT prophylaxis 9. Deconditioned-PT/OT. Must ambulate 10. Awaiting transfer to [facility];possibly today  Donielle M ZimmermanPA-C 09/30/2022,7:09 AM   Agree with above Correcting hyponatremia Clinically looks better today. Will plan for transfer to [place] for advanced endoscopic therapy Continue chest tube drainage for now.  Jadea Shiffer Bary Leriche

## 2022-09-30 NOTE — Progress Notes (Signed)
Patient ID: [name] [name], male   DOB: [birth year], 61 y.o.   MRN: [id number]    Progress Note   Subjective   Day # 14 CC; esophageal cancer, status post Ivor Lewis esophagectomy, pyloromyotomy and J-tube placement 94/04/961.,  Course complicated by persistent esophageal leak IV Zosyn/Diflucan  EGD 09/18/2022 with removal of pre-existing esophageal stent, finding of fistula in the proximal esophagus not covered by the previous stent, larger fistula just above the anastomosis for 4 cm x 1.5 cm which had been covered by the prior stent. Placement of new fully covered esophageal stent 23 mm x 12.5 cm wall flex  Chest tube-output 150 cc past 24 hours JP with scant output x 24 hours J-tube feedings at 75 cc an hour  Patient says he feels okay, no changes, he is aware of sensing the stent in his esophagus which is uncomfortable but not causing any severe pain.  Trying to get up with PT this morning Hoping for transfer to [facility] today, asking about ability to return to care here at [facility] after procedure.    Objective   Vital signs in last 24 hours: Temp:  [97.6 F (36.4 C)-98 F (36.7 C)] 98 F (36.7 C) (12/15 0828) Pulse Rate:  [73-89] 73 (12/15 0502) Resp:  [19-25] 25 (12/15 0828) BP: (99-107)/(65-69) 106/65 (12/15 0828) SpO2:  [96 %-99 %] 99 % (12/15 0828) Weight:  [79.1 kg] 79.1 kg (12/15 0649) Last BM Date : 09/27/22 General:   older WM in NAD Heart:  Regular rate and rhythm; no murmurs Lungs: Respirations even and unlabored, decreased breath sounds on the right, chest tube in place right lateral chest, JP right lateral chest, purulent drainage from the right lateral wound Abdomen:  Soft, nontender and nondistended. Normal bowel sounds. Extremities:  Without edema. Neurologic:  Alert and oriented,  grossly normal neurologically. Psych:  Cooperative. Normal mood and affect.  Intake/Output from previous day: 12/14 0701 - 12/15 0700 In: 4197.3 [NG/GT:3200; IV Piggyback:877.3] Out:  150 [Chest Tube:150] Intake/Output this shift: Total I/O In: -  Out: 650 [Urine:650]  Lab Results: No results for input(s): "WBC", "HGB", "HCT", "PLT" in the last 72 hours. BMET Recent Labs    09/30/22 0535  CREATININE 0.54*   LFT No results for input(s): "PROT", "ALBUMIN", "AST", "ALT", "ALKPHOS", "BILITOT", "BILIDIR", "IBILI" in the last 72 hours. PT/INR No results for input(s): "LABPROT", "INR" in the last 72 hours.  Studies/Results: DG CHEST PORT 1 VIEW  Result Date: 09/29/2022 CLINICAL DATA:  Esophageal cancer. EXAM: PORTABLE CHEST 1 VIEW COMPARISON:  Multiple previous chest x-rays. The most recent is 09/27/2019 through FINDINGS: Stable right-sided chest 2, right IJ Port-A-Cath and esophageal stent. Stable loss of volume in the right hemithorax with persistent loculated pleural fluid collections. The left lung remains relatively clear. IMPRESSION: 1. Stable support apparatus. 2. Stable loss of volume in the right hemithorax with persistent loculated pleural fluid collections. Electronically Signed   By: Marijo Sanes M.D.   On: 09/29/2022 14:53   DG C-Arm 1-60 Min  Result Date: 10/07/2022 CLINICAL DATA:  Intraoperative fluoroscopy EXAM: DG C-ARM 1-60 MIN; DG C-ARM 1-60 MIN-NO REPORT COMPARISON:  09/26/2022, 10/11/2022 FINDINGS: Eighteen C-arm fluoroscopic images were obtained intraoperatively and submitted for post operative interpretation. Images obtained during esophageal stent removal and replacement. 4 minutes 4 seconds fluoroscopy time utilized. Radiation dose: 30.65 mGy. Please see the performing provider's procedural report for further detail. IMPRESSION: Intraoperative fluoroscopy for esophageal stent replacement. Electronically Signed   By: Davina Poke D.O.  On: 10/01/2022 16:19   DG C-Arm 1-60 Min-No Report  Result Date: 10/06/2022 Fluoroscopy was utilized by the requesting physician.  No radiographic interpretation.   DG C-Arm 1-60 Min-No Report  Result  Date: 10/14/2022 Fluoroscopy was utilized by the requesting physician.  No radiographic interpretation.       Assessment / Plan:    #80 61 year old white male with esophageal adenocarcinoma, completed radiation therapy been status post Ivor Lewis esophagectomy 62/04/349 Was complicated by anastomotic leak, and has undergone EGD with esophageal stent placement per CVTS 08/29/2022 also had bouts at that time. Readmitted 12 1 with failure to thrive intermittent fevers and purulent drainage from the JP and chest tube.  Repeat EGD with repositioning of the 125 mm covered stent 09/27/2022 and right VATS  Persistent leak Status post EGD with removal of the previously placed esophageal stent on 09/21/2022, finding of 3 fistulous openings,, the largest is 40 mm x 15 mm in the proximal esophagus about 26 to 30 cm into the anastomosis there is some evidence of necrosis.  Stent was replaced with a 23 mm x 12.5 cm wall flex covered stent.  Has been stable postprocedure, but continues to have purulent drainage from the JP, the chest tube and his chest wound Cultures positive for E. coli, covering with Zosyn and Diflucan  Plan; Patient is to be transferred to [facility] for endoscopic suturing No changes from GI perspective today  Patient is asking if he can be transferred back here once he is stable to complete hospitalization.       Principal Problem:   Fever Active Problems:   Esophageal dysphagia   GE junction carcinoma (HCC)   Port-A-Cath in place   Esophageal cancer ([place])   History of esophagectomy   Malnutrition of moderate degree   Empyema (HCC)   Esophageal anastomotic leak   History of esophageal cancer   Fibrous mediastinitis   Esophageal fistula   Protein-calorie malnutrition, severe     LOS: 14 days   Nickole Adamek PA-C 09/30/2022, 10:51 AM

## 2022-09-30 NOTE — Progress Notes (Signed)
Occupational Therapy Treatment Patient Details Name: [name] [name] MRN: [id number] DOB: [birth year] Today's Date: 09/30/2022   History of present illness Pt is a 61 y.o. 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 Workup for anastomotic leak, to OR for stent repositioning and pleural space drainage 12/4. Pt with persistent purulent drainage, concern for persistent leak; barium swallow 12/11 showed R>L leak within upper mediastinum. Other PMH includes radiation therapy (04/2022-06/2022).   OT comments  Pt readily willing to work with therapies. Mobilizing with min guard assist and RW. Completed grooming in sitting with set up. Toileted with assist for pericare. Pt with HR to 136 with ambulation. Pt initiates seated rest breaks. Continues to have excessive drainage, RN reinforced dressing.    Recommendations for follow up therapy are one component of a multi-disciplinary discharge planning process, led by the attending physician.  Recommendations may be updated based on patient status, additional functional criteria and insurance authorization.    Follow Up Recommendations  Home health OT     Assistance Recommended at Discharge Frequent or constant Supervision/Assistance  Patient can return home with the following  A little help with walking and/or transfers;A little help with bathing/dressing/bathroom;Assistance with cooking/housework;Assist for transportation;Help with stairs or ramp for entrance   Equipment Recommendations       Recommendations for Other Services      Precautions / Restrictions Precautions Precautions: Fall;Other (comment) Precaution Comments: R-side chest tube (drainage, RN present to replaced gauze) and JP drain, peg Restrictions Weight Bearing Restrictions: No       Mobility Bed Mobility Overal  bed mobility: Needs Assistance Bed Mobility: Supine to Sit     Supine to sit: Supervision     General bed mobility comments: HOB up, assist for multiple lines    Transfers Overall transfer level: Needs assistance Equipment used: Rolling walker (2 wheels) Transfers: Sit to/from Stand Sit to Stand: Min guard           General transfer comment: cues for hand placement, good control of descent     Balance Overall balance assessment: Needs assistance   Sitting balance-Leahy Scale: Good       Standing balance-Leahy Scale: Poor                             ADL either performed or assessed with clinical judgement   ADL Overall ADL's : Needs assistance/impaired     Grooming: Brushing hair;Oral care;Sitting;Set up Grooming Details (indicate cue type and reason): shampoo cap seated at sink         Upper Body Dressing : Minimal assistance;Sitting       Toilet Transfer: Min guard;Ambulation;Rolling walker (2 wheels);BSC/3in1   Toileting- Clothing Manipulation and Hygiene: Total assistance;Sit to/from stand       Functional mobility during ADLs: Min guard;Rolling walker (2 wheels)      Extremity/Trunk Assessment              Vision       Perception     Praxis      Cognition Arousal/Alertness: Awake/alert Behavior During Therapy: WFL for tasks assessed/performed Overall Cognitive Status: Within Functional Limits for tasks assessed  General Comments: pt plans ahead before mobilizing, some self limiting behavior        Exercises      Shoulder Instructions       General Comments      Pertinent Vitals/ Pain       Pain Assessment Pain Assessment: Faces Faces Pain Scale: Hurts a little bit Pain Location: R chest Pain Descriptors / Indicators: Radiating Pain Intervention(s): Monitored during session  Home Living                                          Prior  Functioning/Environment              Frequency  Min 2X/week        Progress Toward Goals  OT Goals(current goals can now be found in the care plan section)  Progress towards OT goals: Progressing toward goals  Acute Rehab OT Goals OT Goal Formulation: With patient Time For Goal Achievement: 10/07/22 Potential to Achieve Goals: Good ADL Goals Pt Will Perform Lower Body Bathing: with supervision;sit to/from stand Pt Will Perform Lower Body Dressing: with supervision;sit to/from stand Pt Will Transfer to Toilet: with supervision;ambulating Pt/caregiver will Perform Home Exercise Program: Both right and left upper extremity;With theraband;With written HEP provided;Independently Additional ADL Goal #1: Pt will verbalize 3 strateiges for Energy conservation Additional ADL Goal #2: Pt will verbalize 3 strategies to reduce risk of falls  Plan Discharge plan remains appropriate    Co-evaluation    PT/OT/SLP Co-Evaluation/Treatment: Yes Reason for Co-Treatment: Complexity of the patient's impairments (multi-system involvement);For patient/therapist safety   OT goals addressed during session: ADL's and self-care      AM-PAC OT "6 Clicks" Daily Activity     Outcome Measure   Help from another person eating meals?: Total Help from another person taking care of personal grooming?: A Little Help from another person toileting, which includes using toliet, bedpan, or urinal?: A Lot Help from another person bathing (including washing, rinsing, drying)?: A Little Help from another person to put on and taking off regular upper body clothing?: A Little Help from another person to put on and taking off regular lower body clothing?: A Little 6 Click Score: 15    End of Session Equipment Utilized During Treatment: Rolling walker (2 wheels)  OT Visit Diagnosis: Unsteadiness on feet (R26.81);Other abnormalities of gait and mobility (R26.89);Muscle weakness (generalized) (M62.81);Pain    Activity Tolerance Patient tolerated treatment well   Patient Left in chair;with call bell/phone within reach   Nurse Communication Other (comment) (aware of drainage, changed dressing)        Time: 2440-1027 OT Time Calculation (min): 41 min  Charges: OT General Charges $OT Visit: 1 Visit OT Treatments $Self Care/Home Management : 23-37 mins  Cleta Alberts, OTR/L Acute Rehabilitation Services Office: [phone]   Malka So 09/30/2022, 12:14 PM

## 2022-09-30 NOTE — Progress Notes (Signed)
Physical Therapy Treatment Patient Details Name: [name] [name] MRN: [id number] DOB: 1961/10/10 Today's Date: 09/30/2022   History of Present Illness Pt is a 61 y.o. 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 Workup for anastomotic leak, to OR for stent repositioning and pleural space drainage 12/4. Pt with persistent purulent drainage, concern for persistent leak; barium swallow 12/11 showed R>L leak within upper mediastinum. Other PMH includes radiation therapy (04/2022-06/2022).    PT Comments    Pt was in a much better mood and willing to participate without max encouragement today. Pt was able to perform all functional mobility without physical assistance, but was reliant on a RW for standing mobility balance. He demonstrated knee instability initially upon standing, but this improved with each standing bout. He is limited in gait distance due to fatigue and his HR increasing up to 141 bpm, often needing to sit to rest after gait bouts of only up to ~20 ft at a time. Encouraged use of IS, progressing mobility frequency, and sitting up in chair vs getting back in bed. Pt agreeable to sit in chair end of session. Will continue to follow acutely. Current recommendations remain appropriate. Added a rollator and 3in1 for equipment recommendations upon d/c based on his limited mobility tolerance and need for an AD for balance at this time.     Recommendations for follow up therapy are one component of a multi-disciplinary discharge planning process, led by the attending physician.  Recommendations may be updated based on patient status, additional functional criteria and insurance authorization.  Follow Up Recommendations  Home health PT     Assistance Recommended at Discharge Intermittent Supervision/Assistance  Patient can  return home with the following A little help with bathing/dressing/bathroom;Assistance with cooking/housework;Direct supervision/assist for medications management;Direct supervision/assist for financial management;Assist for transportation;Help with stairs or ramp for entrance;A little help with walking and/or transfers   Equipment Recommendations  Rollator (4 wheels);BSC/3in1    Recommendations for Other Services       Precautions / Restrictions Precautions Precautions: Fall;Other (comment) Precaution Comments: R-side chest tube (drainage, RN present to replace gauze) and JP drain, peg, watch HR Restrictions Weight Bearing Restrictions: No     Mobility  Bed Mobility Overal bed mobility: Needs Assistance Bed Mobility: Supine to Sit     Supine to sit: Supervision, HOB elevated     General bed mobility comments: HOB up, assist for multiple lines, supervision for safety    Transfers Overall transfer level: Needs assistance Equipment used: Rolling walker (2 wheels) Transfers: Sit to/from Stand Sit to Stand: Min guard, +2 safety/equipment           General transfer comment: cues for hand placement, good control of descent, min guard for safety, +2 for line management    Ambulation/Gait Ambulation/Gait assistance: Min guard Gait Distance (Feet): 20 Feet (x5 bouts of ~8 ft > ~20 ft > ~18 ft > ~6 ft > ~20 ft) Assistive device: Rolling walker (2 wheels) Gait Pattern/deviations: Step-through pattern, Decreased stride length, Trunk flexed, Knee flexed in stance - right, Knee flexed in stance - left Gait velocity: Decreased Gait velocity interpretation: <1.31 ft/sec, indicative of household ambulator   General Gait Details: Pt initially with noted knee instability when ambulating, but this improved by the second bout. No LOB, but pt slow and guarded, requesting chair as he fatigued often. Min guard for safety, +2  for chair follow and line management   Stairs              Wheelchair Mobility    Modified Rankin (Stroke Patients Only)       Balance Overall balance assessment: Needs assistance Sitting-balance support: No upper extremity supported Sitting balance-Leahy Scale: Good Sitting balance - Comments: reaches off BOS to perform tasks sitting at sink   Standing balance support: Bilateral upper extremity supported, During functional activity, Reliant on assistive device for balance Standing balance-Leahy Scale: Poor Standing balance comment: Reliant on RW for standing mobility                            Cognition Arousal/Alertness: Awake/alert Behavior During Therapy: WFL for tasks assessed/performed Overall Cognitive Status: Within Functional Limits for tasks assessed                                 General Comments: pt plans ahead before mobilizing, some self limiting behavior but much improved participation with min encouragement needed today        Exercises      General Comments General comments (skin integrity, edema, etc.): encouraged pt to sit up in chair and further progress mobility frequency; encouraged use of IS      Pertinent Vitals/Pain Pain Assessment Pain Assessment: Faces Faces Pain Scale: Hurts a little bit Pain Location: R chest Pain Descriptors / Indicators: Radiating Pain Intervention(s): Limited activity within patient's tolerance, Monitored during session, Repositioned, Other (comment) (RN replacing dressing)    Home Living                          Prior Function            PT Goals (current goals can now be found in the care plan section) Acute Rehab PT Goals Patient Stated Goal: to go to [facility] and come back PT Goal Formulation: With patient Time For Goal Achievement: 10/07/22 Potential to Achieve Goals: Good Progress towards PT goals: Progressing toward goals    Frequency    Min 3X/week      PT Plan Equipment recommendations need to be updated     Co-evaluation PT/OT/SLP Co-Evaluation/Treatment: Yes Reason for Co-Treatment: Complexity of the patient's impairments (multi-system involvement);For patient/therapist safety;To address functional/ADL transfers PT goals addressed during session: Mobility/safety with mobility;Balance;Proper use of DME OT goals addressed during session: ADL's and self-care      AM-PAC PT "6 Clicks" Mobility   Outcome Measure  Help needed turning from your back to your side while in a flat bed without using bedrails?: A Little Help needed moving from lying on your back to sitting on the side of a flat bed without using bedrails?: A Little Help needed moving to and from a bed to a chair (including a wheelchair)?: A Little Help needed standing up from a chair using your arms (e.g., wheelchair or bedside chair)?: A Little Help needed to walk in hospital room?: A Little Help needed climbing 3-5 steps with a railing? : A Lot 6 Click Score: 17    End of Session   Activity Tolerance: Patient tolerated treatment well Patient left: in chair;with call bell/phone within reach Nurse Communication: Mobility status;Other (comment) (drainage around chest tube/JP drain) PT Visit Diagnosis: Other abnormalities of gait and mobility (R26.89);Muscle weakness (generalized) (M62.81);Unsteadiness on feet (R26.81)     Time: 5361-4431  PT Time Calculation (min) (ACUTE ONLY): 41 min  Charges:  $Gait Training: 8-22 mins                     Moishe Spice, PT, DPT Acute Rehabilitation Services  Office: [place] 09/30/2022, 2:40 PM

## 2022-09-30 NOTE — Progress Notes (Signed)
Pharmacy Antibiotic Note  [name] [name] is a 60 y.o. male admitted on 10/01/2022 with  fever s/p esophagectomy for esophageal cancer .  Pharmacy has been consulted for vancomycin and Zosyn dosing. He is s/p esophagectomy on 01/3 complicated by anastomotic leak with stent placed on 11/13.   He was to have 6 weeks of abx/antifungal from 11/16 >> 12/28, initially with IV Zosyn and fluconazole. This was changed to Augmentin and oral fluconazole on discharge per ID (outpt course may need to change with new culture data). Pleural culture with E coli growing again, now resistant to Unasyn so will need alternative antibiotics at discharge.   Cr remains stable, will continue Zosyn and  fluconazole '400mg'$  IV daily (maintenance dose for intraabdominal and esophageal infections).  Plan: Continue Zosyn 3.375 g IV q8h to be infused over 4 hours Continue fluconazole to '400mg'$  IV q24h Monitor renal function and clinical status Follow-up LOT  Height: '5\' 10"'$  (177.8 cm) Weight: 79.1 kg (174 lb 6.1 oz) IBW/kg (Calculated) : 73  Temp (24hrs), Avg:97.7 F (36.5 C), Min:97.6 F (36.4 C), Max:98 F (36.7 C)  Recent Labs  Lab 09/24/22 0326 09/25/22 0540 09/26/22 0445 09/27/22 0505 09/30/22 0535  WBC  --  13.2* 14.4* 10.9*  --   CREATININE 0.58*  --  0.58* 0.55* 0.54*    Estimated Creatinine Clearance: 97.6 mL/min (A) (by C-G formula based on SCr of 0.54 mg/dL (L)).    No Known Allergies  Antimicrobials this admission: Vancomycin 12/1 >>12/9 Zosyn 12/1 >>  Fluconazole 12/1 >>  Microbiology results: 12/4 MRSA PCR neg 12/4 body fluid: E Coli - Zosyn S  Sloan Leiter, PharmD, BCPS, BCCCP Please refer to [facility] for [place] numbers 09/30/2022

## 2022-10-01 LAB — GLUCOSE, CAPILLARY
Glucose-Capillary: 142 mg/dL — ABNORMAL HIGH (ref 70–99)
Glucose-Capillary: 126 mg/dL — ABNORMAL HIGH (ref 70–99)
Glucose-Capillary: 142 mg/dL — ABNORMAL HIGH (ref 70–99)
Glucose-Capillary: 140 mg/dL — ABNORMAL HIGH (ref 70–99)
Glucose-Capillary: 135 mg/dL — ABNORMAL HIGH (ref 70–99)

## 2022-10-01 LAB — BASIC METABOLIC PANEL
Anion gap: 5 (ref 5–15)
BUN: 18 mg/dL (ref 8–23)
CO2: 31 mmol/L (ref 22–32)
Calcium: 8.9 mg/dL (ref 8.9–10.3)
Chloride: 96 mmol/L — ABNORMAL LOW (ref 98–111)
Creatinine, Ser: 0.58 mg/dL — ABNORMAL LOW (ref 0.61–1.24)
GFR, Estimated: >60 mL/min (ref >60)
Glucose, Bld: 165 mg/dL — ABNORMAL HIGH (ref 70–99)
Potassium: 4.4 mmol/L (ref 3.5–5.1)
Sodium: 132 mmol/L — ABNORMAL LOW (ref 135–145)

## 2022-10-01 NOTE — Progress Notes (Addendum)
[address]       [place] [address]             [phone]     3 Days Post-Op Procedure(s) (LRB): ESOPHAGOGASTRODUODENOSCOPY (EGD) WITH PROPOFOL (N/A) ESOPHAGEAL STENT PLACEMENT (N/A) STENT REMOVAL Subjective: Feels ok, no new c/o  Objective: Vital signs in last 24 hours: Temp:  [97.6 F (36.4 C)-98.4 F (36.9 C)] 98.4 F (36.9 C) (12/16 0400) Pulse Rate:  [87-92] 88 (12/16 0400) Cardiac Rhythm: Normal sinus rhythm (12/16 0700) Resp:  [19-22] 20 (12/16 0400) BP: (94-101)/(64-71) 98/70 (12/16 0400) SpO2:  [97 %-98 %] 97 % (12/16 0400) Weight:  [79.8 kg] 79.8 kg (12/16 0400)  Hemodynamic parameters for last 24 hours:    Intake/Output from previous day: 12/15 0701 - 12/16 0700 In: 2392.1 [I.V.:40; NG/GT:1975; IV Piggyback:317.1] Out: 1560 [Urine:1500; Drains:10; Chest Tube:50] Intake/Output this shift: No intake/output data recorded.  General appearance: alert, cooperative, fatigued, and no distress Heart: regular rate and rhythm Lungs: Diminished on right Abdomen: Soft, nontender, nondistended Extremities: No edema Wound: Incisions are stable  Lab Results: No results for input(s): "WBC", "HGB", "HCT", "PLT" in the last 72 hours. BMET:  Recent Labs    09/30/22 0535 10/01/22 0510  NA  --  132*  K  --  4.4  CL  --  96*  CO2  --  31  GLUCOSE  --  165*  BUN  --  18  CREATININE 0.54* 0.58*  CALCIUM  --  8.9    PT/INR: No results for input(s): "LABPROT", "INR" in the last 72 hours. ABG No results found for: "PHART", "HCO3", "TCO2", "ACIDBASEDEF", "O2SAT" CBG (last 3)  Recent Labs    09/30/22 1952 09/30/22 2338 10/01/22 0402  GLUCAP 167* 167* 142*    Meds Scheduled Meds:  Chlorhexidine Gluconate Cloth  6 each Topical Daily   enoxaparin (LOVENOX) injection  40 mg Subcutaneous Q24H   feeding supplement (PROSource TF20)  60 mL Per Tube Daily   fiber  1 packet Per Tube BID   free water  200 mL Per Tube Q6H   mouth rinse  15 mL  Mouth Rinse 4 times per day   pantoprazole (PROTONIX) IV  40 mg Intravenous Q24H   sodium chloride flush  30 mL Per Tube Q8H   Continuous Infusions:  feeding supplement (OSMOLITE 1.5 CAL) 1,000 mL (10/01/22 0517)   fluconazole (DIFLUCAN) IV Stopped (09/30/22 1916)   piperacillin-tazobactam (ZOSYN)  IV 3.375 g (10/01/22 0514)   PRN Meds:.acetaminophen, fentaNYL (SUBLIMAZE) injection, HYDROcodone-acetaminophen, influenza vac split quadrivalent PF, iohexol, loperamide HCl, ondansetron (ZOFRAN) IV, mouth rinse, pneumococcal 20-valent conjugate vaccine  Xrays DG CHEST PORT 1 VIEW  Result Date: 09/29/2022 CLINICAL DATA:  Esophageal cancer. EXAM: PORTABLE CHEST 1 VIEW COMPARISON:  Multiple previous chest x-rays. The most recent is 09/27/2019 through FINDINGS: Stable right-sided chest 2, right IJ Port-A-Cath and esophageal stent. Stable loss of volume in the right hemithorax with persistent loculated pleural fluid collections. The left lung remains relatively clear. IMPRESSION: 1. Stable support apparatus. 2. Stable loss of volume in the right hemithorax with persistent loculated pleural fluid collections. Electronically Signed   By: Marijo Sanes M.D.   On: 09/29/2022 14:53    Assessment/Plan: S/P Procedure(s) (LRB): ESOPHAGOGASTRODUODENOSCOPY (EGD) WITH PROPOFOL (N/A) ESOPHAGEAL STENT PLACEMENT (N/A) STENT REMOVAL  1 afebrile, vital signs stable, sinus rhythm 2 oxygen saturations are good on room air 3 good urine output 4 chest tube 50 cc/24 H 5 drain 10 cc/24H  6 normal renal function, mild hyponatremia and hypochloremia 7 continues Zosyn and Diflucan 8 Continues n.p.o. with tube feedings 9 Lovenox for DVT PPx 10 blood sugars adequately controlled       LOS: 15 days    Odis Luster Pager [phone] 10/01/2022  Patient seen and examined, agree with above  Remo Lipps C. Roxan Hockey, MD [facility] [phone]

## 2022-10-02 ENCOUNTER — Encounter (HOSPITAL_COMMUNITY): Payer: Self-pay | Admitting: Gastroenterology

## 2022-10-02 LAB — GLUCOSE, CAPILLARY
Glucose-Capillary: 131 mg/dL — ABNORMAL HIGH (ref 70–99)
Glucose-Capillary: 145 mg/dL — ABNORMAL HIGH (ref 70–99)
Glucose-Capillary: 146 mg/dL — ABNORMAL HIGH (ref 70–99)
Glucose-Capillary: 147 mg/dL — ABNORMAL HIGH (ref 70–99)
Glucose-Capillary: 155 mg/dL — ABNORMAL HIGH (ref 70–99)
Glucose-Capillary: 166 mg/dL — ABNORMAL HIGH (ref 70–99)
Glucose-Capillary: 180 mg/dL — ABNORMAL HIGH (ref 70–99)

## 2022-10-02 NOTE — Plan of Care (Signed)
  Problem: Clinical Measurements: Goal: Respiratory complications will improve Outcome: Progressing Goal: Cardiovascular complication will be avoided Outcome: Progressing   Problem: Activity: Goal: Risk for activity intolerance will decrease Outcome: Progressing   Problem: Nutrition: Goal: Adequate nutrition will be maintained Outcome: Progressing   Problem: Elimination: Goal: Will not experience complications related to urinary retention Outcome: Progressing   Problem: Pain Managment: Goal: General experience of comfort will improve Outcome: Not Progressing

## 2022-10-02 NOTE — Progress Notes (Addendum)
      [address]       [place] [address]             [phone]     4 Days Post-Op Procedure(s) (LRB): ESOPHAGOGASTRODUODENOSCOPY (EGD) WITH PROPOFOL (N/A) ESOPHAGEAL STENT PLACEMENT (N/A) STENT REMOVAL Subjective: No specific complaints  Objective: Vital signs in last 24 hours: Temp:  [97.8 F (36.6 C)-98.5 F (36.9 C)] 98.5 F (36.9 C) (12/17 0801) Pulse Rate:  [84-99] 93 (12/17 0801) Cardiac Rhythm: Normal sinus rhythm (12/17 0700) Resp:  [19-28] 22 (12/17 0801) BP: (95-104)/(63-69) 102/66 (12/17 0801) SpO2:  [95 %-100 %] 95 % (12/17 0801) Weight:  [78.5 kg] 78.5 kg (12/17 0521)  Hemodynamic parameters for last 24 hours:    Intake/Output from previous day: 12/16 0701 - 12/17 0700 In: -  Out: 2255 [Urine:2250; Chest Tube:5] Intake/Output this shift: Total I/O In: -  Out: 350 [Urine:350]  General appearance: alert, cooperative, fatigued, and no distress Heart: regular rate and rhythm Lungs: Diminished right greater than left base Abdomen: Soft, nontender Extremities: Warm well-perfused Wound: Healing well  Lab Results: No results for input(s): "WBC", "HGB", "HCT", "PLT" in the last 72 hours. BMET:  Recent Labs    09/30/22 0535 10/01/22 0510  NA  --  132*  K  --  4.4  CL  --  96*  CO2  --  31  GLUCOSE  --  165*  BUN  --  18  CREATININE 0.54* 0.58*  CALCIUM  --  8.9    PT/INR: No results for input(s): "LABPROT", "INR" in the last 72 hours. ABG No results found for: "PHART", "HCO3", "TCO2", "ACIDBASEDEF", "O2SAT" CBG (last 3)  Recent Labs    10/02/22 0030 10/02/22 0356 10/02/22 0803  GLUCAP 147* 145* 155*    Meds Scheduled Meds:  Chlorhexidine Gluconate Cloth  6 each Topical Daily   enoxaparin (LOVENOX) injection  40 mg Subcutaneous Q24H   feeding supplement (PROSource TF20)  60 mL Per Tube Daily   fiber  1 packet Per Tube BID   free water  200 mL Per Tube Q6H   mouth rinse  15 mL Mouth Rinse 4 times per day    pantoprazole (PROTONIX) IV  40 mg Intravenous Q24H   sodium chloride flush  30 mL Per Tube Q8H   Continuous Infusions:  feeding supplement (OSMOLITE 1.5 CAL) 1,000 mL (10/01/22 2042)   fluconazole (DIFLUCAN) IV 100 mL/hr at 10/01/22 2038   piperacillin-tazobactam (ZOSYN)  IV 3.375 g (10/02/22 0624)   PRN Meds:.acetaminophen, fentaNYL (SUBLIMAZE) injection, HYDROcodone-acetaminophen, influenza vac split quadrivalent PF, iohexol, loperamide HCl, ondansetron (ZOFRAN) IV, mouth rinse, pneumococcal 20-valent conjugate vaccine  Xrays No results found.  Assessment/Plan: S/P Procedure(s) (LRB): ESOPHAGOGASTRODUODENOSCOPY (EGD) WITH PROPOFOL (N/A) ESOPHAGEAL STENT PLACEMENT (N/A) STENT REMOVAL  1 afebrile, vital signs stable, sinus rhythm 2 oxygen saturations are good on room air 3 continues tube feeds 4 continue Zosyn and Diflucan 5 chest tube and JP drain with scant drainage recorded 6 excellent urine output 7 blood sugar control is adequate 8 no new labs or x-rays, will order for morning 9 chest tube no airleak, drainage remains feculent 10 potential tx to [facility] soon-no change in current management    LOS: 16 days    Odis Luster Pager [phone] 10/02/2022  Patient seen and examined, agree with above Hopefully can get to [facility] this week  Remo Lipps C. Roxan Hockey, MD [facility] [phone]

## 2022-10-03 ENCOUNTER — Inpatient Hospital Stay (HOSPITAL_COMMUNITY): Payer: No Typology Code available for payment source

## 2022-10-03 LAB — CBC
HCT: 30.7 % — ABNORMAL LOW (ref 39.0–52.0)
Hemoglobin: 9.9 g/dL — ABNORMAL LOW (ref 13.0–17.0)
MCH: 30.7 pg (ref 26.0–34.0)
MCHC: 32.2 g/dL (ref 30.0–36.0)
MCV: 95.3 fL (ref 80.0–100.0)
Platelets: 453 10*3/uL — ABNORMAL HIGH (ref 150–400)
RBC: 3.22 MIL/uL — ABNORMAL LOW (ref 4.22–5.81)
RDW: 16.2 % — ABNORMAL HIGH (ref 11.5–15.5)
WBC: 9.1 10*3/uL (ref 4.0–10.5)
nRBC: 0 % (ref 0.0–0.2)

## 2022-10-03 LAB — GLUCOSE, CAPILLARY
Glucose-Capillary: 134 mg/dL — ABNORMAL HIGH (ref 70–99)
Glucose-Capillary: 186 mg/dL — ABNORMAL HIGH (ref 70–99)
Glucose-Capillary: 152 mg/dL — ABNORMAL HIGH (ref 70–99)
Glucose-Capillary: 131 mg/dL — ABNORMAL HIGH (ref 70–99)
Glucose-Capillary: 126 mg/dL — ABNORMAL HIGH (ref 70–99)
Glucose-Capillary: 131 mg/dL — ABNORMAL HIGH (ref 70–99)

## 2022-10-03 LAB — BASIC METABOLIC PANEL
Anion gap: 11 (ref 5–15)
BUN: 17 mg/dL (ref 8–23)
CO2: 27 mmol/L (ref 22–32)
Calcium: 9.7 mg/dL (ref 8.9–10.3)
Chloride: 93 mmol/L — ABNORMAL LOW (ref 98–111)
Creatinine, Ser: 0.67 mg/dL (ref 0.61–1.24)
GFR, Estimated: >60 mL/min (ref >60)
Glucose, Bld: 143 mg/dL — ABNORMAL HIGH (ref 70–99)
Potassium: 4.6 mmol/L (ref 3.5–5.1)
Sodium: 131 mmol/L — ABNORMAL LOW (ref 135–145)

## 2022-10-03 MED ORDER — KETOROLAC TROMETHAMINE 30 MG/ML IJ SOLN
30.0000 mg | Freq: Four times a day (QID) | INTRAMUSCULAR | Status: AC
  Administered 2022-10-03 – 2022-10-08 (×18): 30 mg via INTRAVENOUS
  Filled 2022-10-03 (×20): qty 1

## 2022-10-03 NOTE — TOC Progression Note (Addendum)
Transition of Care [facility]) - Progression Note    Patient Details  Name: [name] [name] MRN: [id number] Date of Birth: 11-08-1960  [facility] CM/SW Contact  Zenon Mayo, RN Phone Number: 10/03/2022, 12:04 PM  Clinical Narrative:    For transfer to [facility], after [facility] closes the large fistula then he will come back here for the other two fistulas for stents per progression rounds.  TOC following.         Expected Discharge Plan and Services                                                 Social Determinants of Health (SDOH) Interventions    Readmission Risk Interventions     No data to display

## 2022-10-03 NOTE — Progress Notes (Signed)
Pharmacy Antibiotic Note  [name] [name] is a 61 y.o. male admitted on 09/18/2022 with  fever s/p esophagectomy for esophageal cancer .  Pharmacy has been consulted for vancomycin and Zosyn dosing. He is s/p esophagectomy on 94/5 complicated by anastomotic leak with stent placed on 11/13.   He was to have 6 weeks of abx/antifungal from 11/16 >> 12/28, initially with IV Zosyn and fluconazole. This was changed to Augmentin and oral fluconazole on discharge per ID (outpt course may need to change with new culture data). Pleural culture with E coli growing again, now resistant to Unasyn so will need alternative antibiotics at discharge.   Cr remains stable, will continue Zosyn and  fluconazole '400mg'$  IV daily (maintenance dose for intraabdominal and esophageal infections).  Plan: Continue Zosyn 3.375 g IV q8h to be infused over 4 hours Continue fluconazole to '400mg'$  IV q24h Monitor renal function and clinical status Follow-up LOT  Height: '5\' 10"'$  (177.8 cm) Weight: 79.2 kg (174 lb 9.7 oz) IBW/kg (Calculated) : 73  Temp (24hrs), Avg:98.2 F (36.8 C), Min:97.9 F (36.6 C), Max:98.3 F (36.8 C)  Recent Labs  Lab 09/27/22 0505 09/30/22 0535 10/01/22 0510 10/03/22 0555  WBC 10.9*  --   --  9.1  CREATININE 0.55* 0.54* 0.58* 0.67     Estimated Creatinine Clearance: 97.6 mL/min (by C-G formula based on SCr of 0.67 mg/dL).    No Known Allergies  Antimicrobials this admission: Vancomycin 12/1 >>12/9 Zosyn 12/1 >>  Fluconazole 12/1 >>  Microbiology results: 12/4 MRSA PCR neg 12/4 body fluid: E Coli - Zosyn Thomasene Ripple, PharmD, BCCCP Clinical Pharmacist  Phone: [phone] 10/03/2022 2:15 PM  Please check AMION for all [place] phone numbers After 10:00 PM, call Page [phone]

## 2022-10-03 NOTE — Progress Notes (Addendum)
I have asked patient if I could change PIV and abdominal sites dressings, he wants to wait for now--I will try to do later if time permits, patient understands

## 2022-10-03 NOTE — Progress Notes (Signed)
PT Cancellation Note  Patient Details Name: [name] [name] MRN: [id number] DOB: 06/18/1961   Cancelled Treatment:    Reason Eval/Treat Not Completed: Patient declined, no reason specified (Pt adamantly refusing therapy, stating he is awaiting hospital transfer. Highly encouraged participation in mobility, but pt not interested)  Wyona Almas, PT, DPT Acute Rehabilitation Services Office [place] 10/03/2022, 11:25 AM

## 2022-10-03 NOTE — Plan of Care (Signed)
  Problem: Clinical Measurements: Goal: Respiratory complications will improve Outcome: Progressing Goal: Cardiovascular complication will be avoided Outcome: Progressing   Problem: Activity: Goal: Risk for activity intolerance will decrease Outcome: Progressing   Problem: Nutrition: Goal: Adequate nutrition will be maintained Outcome: Progressing   Problem: Coping: Goal: Level of anxiety will decrease Outcome: Progressing   Problem: Elimination: Goal: Will not experience complications related to urinary retention Outcome: Progressing   Problem: Pain Managment: Goal: General experience of comfort will improve Outcome: Not Progressing

## 2022-10-03 NOTE — Progress Notes (Addendum)
      [address]       [place] [address]             [phone]       5 Days Post-Op Procedure(s) (LRB): ESOPHAGOGASTRODUODENOSCOPY (EGD) WITH PROPOFOL (N/A) ESOPHAGEAL STENT PLACEMENT (N/A) STENT REMOVAL  Subjective: Patient resting and briefly awakened  Objective: Vital signs in last 24 hours: Temp:  [98.1 F (36.7 C)-98.5 F (36.9 C)] 98.2 F (36.8 C) (12/18 0328) Pulse Rate:  [91-95] 95 (12/18 0328) Cardiac Rhythm: Normal sinus rhythm (12/17 2010) Resp:  [18-22] 20 (12/18 0328) BP: (94-102)/(60-66) 95/63 (12/18 0328) SpO2:  [95 %-98 %] 97 % (12/18 0328) Weight:  [79.2 kg] 79.2 kg (12/18 0328)     Intake/Output from previous day: 12/17 0701 - 12/18 0700 In: -  Out: 1160 [Urine:1150; Drains:10]   Physical Exam:  Cardiovascular: RRR Pulmonary: Clear to auscultation on left and diminished on right base Abdomen: Soft, non tender, bowel sounds present. Extremities: No LE edema Wounds: J tube site clean and dry. Chest tube wound mostly clean and dry. Right lateral wound with purulent drainage distally with superficial dehiscence. Dressing is just changed this am and has light brown/purulent drainage on it this am. Right chest tube and JP drain: Chest tube to suction  Lab Results: CBC: No results for input(s): "WBC", "HGB", "HCT", "PLT" in the last 72 hours.  BMET:  Recent Labs    10/01/22 0510  NA 132*  K 4.4  CL 96*  CO2 31  GLUCOSE 165*  BUN 18  CREATININE 0.58*  CALCIUM 8.9     PT/INR: No results for input(s): "LABPROT", "INR" in the last 72 hours. ABG:  INR: Will add last result for INR, ABG once components are confirmed Will add last 4 CBG results once components are confirmed  Assessment/Plan:  1. CV - Previously, ST. Continues to be SR . 2.  Pulmonary - On room air. Chest tube output with no recorded for 24 hours. Light purulent like color from chest tube. JP drain with scant output (10 cc) last 24 hours.  Encourage  incentive spirometer 3. CBGs 246/166/134.  No history of DM but on tube feedings. 4. GI-NPO. Tube feedings at 75 ml/hr. Imodium PRN diarrhea. Pre albumin 12/08 improved to 15. S/p upper GI endoscopy. Pre existing esophageal stent removed, per report, multiple fistulas. Esophageal prosthesis placed.  5. Mild hyponatremia- He has had hyponatremia since admission. Stopped scheduled free water 12/11 but sodium remained decreased.Appreciate nutrition's assistance. Will resume free water but not give sodium chloride tablets per tube (may clog tube). Await this am's BMET 6. Anemia of chronic disease- Last H and H stable at 8.1 and 24.5 7. ID-on Zosyn and Diflucan for esophageal anastomotic leak., E. Coli from right pleural fluid. Last WBC decreased to 10,900.  8. On Lovenox for DVT prophylaxis 9. Deconditioned-PT/OT. Must ambulate 10. Awaiting transfer to [place] 10/03/2022,7:06 AM    Agree with above Awaiting Transfer  Lajuana Matte

## 2022-10-03 NOTE — Progress Notes (Signed)
Mobility Specialist Progress Note    10/03/22 1423  Mobility  Activity Refused mobility   No reason given. Will f/u as schedule permits.   Hildred Alamin Mobility Specialist  Please Psychologist, sport and exercise or Rehab Office at [phone]

## 2022-10-04 LAB — COMPREHENSIVE METABOLIC PANEL WITH GFR
ALT: 68 U/L — ABNORMAL HIGH (ref 0–44)
AST: 32 U/L (ref 15–41)
Albumin: 1.9 g/dL — ABNORMAL LOW (ref 3.5–5.0)
Alkaline Phosphatase: 96 U/L (ref 38–126)
Anion gap: 7 (ref 5–15)
BUN: 24 mg/dL — ABNORMAL HIGH (ref 8–23)
CO2: 28 mmol/L (ref 22–32)
Calcium: 9 mg/dL (ref 8.9–10.3)
Chloride: 95 mmol/L — ABNORMAL LOW (ref 98–111)
Creatinine, Ser: 0.7 mg/dL (ref 0.61–1.24)
GFR, Estimated: >60 mL/min
Glucose, Bld: 153 mg/dL — ABNORMAL HIGH (ref 70–99)
Potassium: 4.5 mmol/L (ref 3.5–5.1)
Sodium: 130 mmol/L — ABNORMAL LOW (ref 135–145)
Total Bilirubin: 0.5 mg/dL (ref 0.3–1.2)
Total Protein: 5.8 g/dL — ABNORMAL LOW (ref 6.5–8.1)

## 2022-10-04 LAB — GLUCOSE, CAPILLARY
Glucose-Capillary: 102 mg/dL — ABNORMAL HIGH (ref 70–99)
Glucose-Capillary: 109 mg/dL — ABNORMAL HIGH (ref 70–99)
Glucose-Capillary: 125 mg/dL — ABNORMAL HIGH (ref 70–99)
Glucose-Capillary: 125 mg/dL — ABNORMAL HIGH (ref 70–99)
Glucose-Capillary: 132 mg/dL — ABNORMAL HIGH (ref 70–99)
Glucose-Capillary: 147 mg/dL — ABNORMAL HIGH (ref 70–99)

## 2022-10-04 LAB — PREALBUMIN: Prealbumin: 18 mg/dL (ref 18–38)

## 2022-10-04 MED ORDER — FREE WATER
150.0000 mL | Freq: Four times a day (QID) | Status: DC
  Administered 2022-10-04 – 2022-10-12 (×33): 150 mL

## 2022-10-04 MED ORDER — FREE WATER: 150.0000 mL | Freq: Four times a day (QID) | Status: AC

## 2022-10-04 NOTE — Progress Notes (Addendum)
      [address]       ,[place] [address]             [phone]       6 Days Post-Op Procedure(s) (LRB): ESOPHAGOGASTRODUODENOSCOPY (EGD) WITH PROPOFOL (N/A) ESOPHAGEAL STENT PLACEMENT (N/A) STENT REMOVAL  Subjective: Patient awake. He is anxiously awaiting transfer to [facility]  Objective: Vital signs in last 24 hours: Temp:  [97.7 F (36.5 C)-98.3 F (36.8 C)] 98.3 F (36.8 C) (12/19 0327) Pulse Rate:  [70-94] 83 (12/19 0327) Cardiac Rhythm: Normal sinus rhythm (12/19 0700) Resp:  [16-21] 16 (12/19 0439) BP: (91-103)/(60-70) 92/62 (12/19 0327) SpO2:  [94 %-99 %] 97 % (12/19 0327) Weight:  [77 kg] 77 kg (12/19 0439)     Intake/Output from previous day: 12/18 0701 - 12/19 0700 In: 0  Out: 975 [Urine:825; Chest Tube:150]   Physical Exam:  Cardiovascular: RRR Pulmonary: Clear to auscultation on left and diminished on right base Abdomen: Soft, non tender, bowel sounds present. Extremities: No LE edema Wounds: J tube site clean and dry. Chest tube wound mostly clean and dry. Right lateral wound dehisced, portion of suture visible. Dressing changed earlier this am so no drainage yet.  Right chest tube and JP drain: Chest tube to suction  Lab Results: CBC: Recent Labs    10/03/22 0555  WBC 9.1  HGB 9.9*  HCT 30.7*  PLT 453*    BMET:  Recent Labs    10/03/22 0555 10/04/22 0524  NA 131* 130*  K 4.6 4.5  CL 93* 95*  CO2 27 28  GLUCOSE 143* 153*  BUN 17 24*  CREATININE 0.67 0.70  CALCIUM 9.7 9.0     PT/INR: No results for input(s): "LABPROT", "INR" in the last 72 hours. ABG:  INR: Will add last result for INR, ABG once components are confirmed Will add last 4 CBG results once components are confirmed  Assessment/Plan:  1. CV - Previously, ST. Continues to be SR . 2.  Pulmonary - On room air. Chest tube output with no recorded for 24 hours. Light purulent like color from chest tube. JP drain with scant output (10 cc) last 24  hours.  Encourage incentive spirometer 3. CBGs 126/131/147.  No history of DM but on tube feedings. 4. GI-NPO. Tube feedings at 75 ml/hr. Imodium PRN diarrhea. Pre albumin this am improved to 18. S/p upper GI endoscopy. Pre existing esophageal stent removed, per report, multiple fistulas. Esophageal prosthesis placed.  5. Mild hyponatremia- Sodium this am 130. He has had hyponatremia since admission. Stopped scheduled free water 12/11 but sodium remained decreased.Appreciate nutrition's assistance.   6. Anemia of chronic disease- Last H and H stable at 8.1 and 24.5 7. ID-on Zosyn and Diflucan for esophageal anastomotic leak., E. Coli from right pleural fluid. WBC yesterday decreased to 9,100. 8. On Lovenox for DVT prophylaxis 9. Deconditioned-PT/OT. Must ambulate 10. Awaiting transfer to [place] 10/04/2022,7:37 AM  Agree with above Awaiting transfer Continue CT management  [name] [name]

## 2022-10-04 NOTE — Progress Notes (Signed)
Physical Therapy Treatment Patient Details Name: [name] [name] MRN: [id number] DOB: 05/20/1961 Today's Date: 10/04/2022   History of Present Illness Pt is a 61 y.o. 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 Workup for anastomotic leak, to OR for stent repositioning and pleural space drainage 12/4. Pt with persistent purulent drainage, concern for persistent leak; barium swallow 12/11 showed R>L leak within upper mediastinum. Other PMH includes radiation therapy (04/2022-06/2022).    PT Comments    Pt making incremental progress towards his physical therapy goals; was agreeable to participate today. Pt ambulating 15 ft x 2 with a Rollator and seated rest break in between bouts. HR peak 135 bpm. Pt requesting to return to bed following using bathroom. Instructed on bridging and gentle TA contractions for glute/core strengthening. Pt requesting to walk again this afternoon; mobility specialist notified. Will continue to progress as tolerated.     Recommendations for follow up therapy are one component of a multi-disciplinary discharge planning process, led by the attending physician.  Recommendations may be updated based on patient status, additional functional criteria and insurance authorization.  Follow Up Recommendations  Home health PT     Assistance Recommended at Discharge Intermittent Supervision/Assistance  Patient can return home with the following A little help with bathing/dressing/bathroom;Assistance with cooking/housework;Direct supervision/assist for medications management;Direct supervision/assist for financial management;Assist for transportation;Help with stairs or ramp for entrance;A little help with walking and/or transfers   Equipment Recommendations  Rollator (4 wheels);BSC/3in1    Recommendations for  Other Services       Precautions / Restrictions Precautions Precautions: Fall;Other (comment) Precaution Comments: R-side chest tube (drainage, RN present to replace gauze) and JP drain, peg, watch HR Restrictions Weight Bearing Restrictions: No     Mobility  Bed Mobility Overal bed mobility: Needs Assistance Bed Mobility: Supine to Sit, Sit to Supine     Supine to sit: Supervision Sit to supine: Supervision   General bed mobility comments: incr time    Transfers Overall transfer level: Needs assistance Equipment used: Rollator (4 wheels) Transfers: Sit to/from Stand Sit to Stand: Min guard, +2 safety/equipment           General transfer comment: +2 for safety and activity tolerance    Ambulation/Gait Ambulation/Gait assistance: Min guard, +2 safety/equipment Gait Distance (Feet): 30 Feet (15', 15') Assistive device: Rollator (4 wheels) Gait Pattern/deviations: Step-through pattern, Decreased stride length, Trunk flexed       General Gait Details: Pt requiring one sitting rest break, min guard overall for safety, good awareness of lines/safety   Stairs             Wheelchair Mobility    Modified Rankin (Stroke Patients Only)       Balance Overall balance assessment: Needs assistance Sitting-balance support: No upper extremity supported Sitting balance-Leahy Scale: Good     Standing balance support: During functional activity, No upper extremity supported Standing balance-Leahy Scale: Fair Standing balance comment: close min G for BUE grooming at the sink                            Cognition Arousal/Alertness: Awake/alert Behavior During Therapy: Flat affect Overall Cognitive Status: Within Functional Limits for tasks assessed  General Comments: good awareness of lines. pt reports he mightve been "a little loopy" yesterday        Exercises Other Exercises Other Exercises:  Supine: bridging x 10, TA contractions x 5 (5 s hold)    General Comments General comments (skin integrity, edema, etc.): VSS. HR to 131 with mobility. chest tube removed from suction for session, re-applied at the end. Pt agreeabel to another walk this afternoon      Pertinent Vitals/Pain Pain Assessment Pain Assessment: Faces Faces Pain Scale: No hurt    Home Living                          Prior Function            PT Goals (current goals can now be found in the care plan section) Acute Rehab PT Goals Potential to Achieve Goals: Good Progress towards PT goals: Progressing toward goals    Frequency    Min 3X/week      PT Plan Current plan remains appropriate    Co-evaluation PT/OT/SLP Co-Evaluation/Treatment: Yes Reason for Co-Treatment: For patient/therapist safety;To address functional/ADL transfers PT goals addressed during session: Mobility/safety with mobility OT goals addressed during session: ADL's and self-care      AM-PAC PT "6 Clicks" Mobility   Outcome Measure  Help needed turning from your back to your side while in a flat bed without using bedrails?: A Little Help needed moving from lying on your back to sitting on the side of a flat bed without using bedrails?: A Little Help needed moving to and from a bed to a chair (including a wheelchair)?: A Little Help needed standing up from a chair using your arms (e.g., wheelchair or bedside chair)?: A Little Help needed to walk in hospital room?: A Little Help needed climbing 3-5 steps with a railing? : A Lot 6 Click Score: 17    End of Session   Activity Tolerance: Patient tolerated treatment well Patient left: in bed;with call bell/phone within reach Nurse Communication: Mobility status PT Visit Diagnosis: Other abnormalities of gait and mobility (R26.89);Muscle weakness (generalized) (M62.81);Unsteadiness on feet (R26.81)     Time: 9675-9163 PT Time Calculation (min) (ACUTE ONLY): 26  min  Charges:  $Therapeutic Activity: 8-22 mins                     Wyona Almas, PT, DPT Acute Rehabilitation Services Office [phone]    Deno Etienne 10/04/2022, 2:01 PM

## 2022-10-04 NOTE — Progress Notes (Addendum)
Occupational Therapy Treatment Patient Details Name: [name] [name] MRN: [id number] DOB: [birth year] Today's Date: 10/04/2022   History of present illness Pt is a 61 y.o. 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 Workup for anastomotic leak, to OR for stent repositioning and pleural space drainage 12/4. Pt with persistent purulent drainage, concern for persistent leak; barium swallow 12/11 showed R>L leak within upper mediastinum. Other PMH includes radiation therapy (04/2022-06/2022).   OT comments  Timmothy Sours is making incremental progress, pt seen with PT for activity tolerance. Overall he was close min G for all mobility and transfers, given cues for rollator management. Pt ambulated to the hallway, and to the bathroom for BM. He completed peri care with set up only, and grooming at the sink with close min G. Pt remains limited by activity tolerance and generalized weakness. Pt provided with level 1 theraband and instructed on bed level exercises, with emphasis on higher repetitions for muscular endurance. OT to continue to follow. POC remains appropriate.    Recommendations for follow up therapy are one component of a multi-disciplinary discharge planning process, led by the attending physician.  Recommendations may be updated based on patient status, additional functional criteria and insurance authorization.    Follow Up Recommendations  Home health OT     Assistance Recommended at Discharge Frequent or constant Supervision/Assistance  Patient can return home with the following  A little help with walking and/or transfers;A little help with bathing/dressing/bathroom;Assistance with cooking/housework;Assist for transportation;Help with stairs or ramp for entrance   Equipment Recommendations  Other (comment)       Precautions /  Restrictions Precautions Precautions: Fall;Other (comment) Precaution Comments: R-side chest tube (drainage, RN present to replace gauze) and JP drain, peg, watch HR Restrictions Weight Bearing Restrictions: No       Mobility Bed Mobility Overal bed mobility: Needs Assistance Bed Mobility: Supine to Sit     Supine to sit: Supervision     General bed mobility comments: incr time    Transfers Overall transfer level: Needs assistance Equipment used: Rollator (4 wheels) Transfers: Sit to/from Stand Sit to Stand: Min guard, +2 safety/equipment           General transfer comment: +2 for safety and activity tolerance     Balance Overall balance assessment: Needs assistance Sitting-balance support: No upper extremity supported Sitting balance-Leahy Scale: Good     Standing balance support: During functional activity, No upper extremity supported Standing balance-Leahy Scale: Fair Standing balance comment: close min G for BUE grooming at the sink                           ADL either performed or assessed with clinical judgement   ADL Overall ADL's : Needs assistance/impaired     Grooming: Wash/dry hands;Min guard;Standing Grooming Details (indicate cue type and reason): at the sink                 Toilet Transfer: Min guard;Ambulation;Rollator (4 wheels);Regular Toilet;Grab bars   Toileting- Clothing Manipulation and Hygiene: Set up;Sitting/lateral lean Toileting - Clothing Manipulation Details (indicate cue type and reason): hygiene in sitting     Functional mobility during ADLs: Min guard;Rollator (4 wheels) General ADL Comments: cues for rollator management (new to pt)    Extremity/Trunk Assessment Upper Extremity Assessment Upper Extremity Assessment: Generalized weakness  Lower Extremity Assessment Lower Extremity Assessment: Generalized weakness        Vision   Vision Assessment?: No apparent visual deficits   Perception  Perception Perception: Within Functional Limits   Praxis Praxis Praxis: Intact    Cognition Arousal/Alertness: Awake/alert Behavior During Therapy: Flat affect Overall Cognitive Status: Within Functional Limits for tasks assessed                                 General Comments: good awareness of lines. pt reports he mightve been "a little loopy" yesterday        Exercises Exercises: Other exercises Other Exercises Other Exercises: Gave pt level 1, yellow theraband for BUE exercises - encouraged higher repetitions       General Comments VSS. HR to 131 with mobility. chest tube removed from suction for session, re-applied at the end. Pt agreeabel to another walk this afternoon    Pertinent Vitals/ Pain       Pain Assessment Pain Assessment: Faces Faces Pain Scale: No hurt Pain Intervention(s): Limited activity within patient's tolerance   Frequency  Min 2X/week        Progress Toward Goals  OT Goals(current goals can now be found in the care plan section)  Progress towards OT goals: Progressing toward goals  Acute Rehab OT Goals Patient Stated Goal: to get home OT Goal Formulation: With patient Time For Goal Achievement: 10/07/22 Potential to Achieve Goals: Good ADL Goals Pt Will Perform Lower Body Bathing: with supervision;sit to/from stand Pt Will Perform Lower Body Dressing: with supervision;sit to/from stand Pt Will Transfer to Toilet: with supervision;ambulating Pt/caregiver will Perform Home Exercise Program: Both right and left upper extremity;With theraband;With written HEP provided;Independently Additional ADL Goal #1: Pt will verbalize 3 strateiges for Energy conservation Additional ADL Goal #2: Pt will verbalize 3 strategies to reduce risk of falls  Plan Discharge plan remains appropriate    Co-evaluation    PT/OT/SLP Co-Evaluation/Treatment: Yes Reason for Co-Treatment: Complexity of the patient's impairments (multi-system  involvement);For patient/therapist safety;To address functional/ADL transfers   OT goals addressed during session: ADL's and self-care      AM-PAC OT "6 Clicks" Daily Activity     Outcome Measure   Help from another person eating meals?: Total Help from another person taking care of personal grooming?: A Little Help from another person toileting, which includes using toliet, bedpan, or urinal?: A Little Help from another person bathing (including washing, rinsing, drying)?: A Little Help from another person to put on and taking off regular upper body clothing?: A Little Help from another person to put on and taking off regular lower body clothing?: A Little 6 Click Score: 16    End of Session Equipment Utilized During Treatment: Rollator (4 wheels)  OT Visit Diagnosis: Unsteadiness on feet (R26.81);Other abnormalities of gait and mobility (R26.89);Muscle weakness (generalized) (M62.81);Pain   Activity Tolerance Patient tolerated treatment well   Patient Left in bed;with call bell/phone within reach;with family/visitor present   Nurse Communication Mobility status        Time: 5631-4970 OT Time Calculation (min): 25 min  Charges: OT General Charges $OT Visit: 1 Visit OT Treatments $Self Care/Home Management : 8-22 mins   Elliot Cousin 10/04/2022, 12:54 PM

## 2022-10-04 NOTE — Plan of Care (Signed)
  Problem: Clinical Measurements: Goal: Respiratory complications will improve Outcome: Progressing Goal: Cardiovascular complication will be avoided Outcome: Progressing   Problem: Coping: Goal: Level of anxiety will decrease Outcome: Progressing   Problem: Elimination: Goal: Will not experience complications related to bowel motility Outcome: Progressing Goal: Will not experience complications related to urinary retention Outcome: Progressing   Problem: Activity: Goal: Risk for activity intolerance will decrease Outcome: Not Progressing   Problem: Pain Managment: Goal: General experience of comfort will improve Outcome: Not Progressing

## 2022-10-04 NOTE — Progress Notes (Signed)
Mobility Specialist Progress Note    10/04/22 1526  Mobility  Activity Refused mobility   Pt worn out from PT and OT. Will f/u as schedule permits.   Hildred Alamin Mobility Specialist  Please Psychologist, sport and exercise or Rehab Office at [phone]

## 2022-10-05 ENCOUNTER — Encounter: Payer: Self-pay | Admitting: Internal Medicine

## 2022-10-05 ENCOUNTER — Encounter: Payer: Self-pay | Admitting: Hematology

## 2022-10-05 LAB — GLUCOSE, CAPILLARY
Glucose-Capillary: 138 mg/dL — ABNORMAL HIGH (ref 70–99)
Glucose-Capillary: 141 mg/dL — ABNORMAL HIGH (ref 70–99)
Glucose-Capillary: 118 mg/dL — ABNORMAL HIGH (ref 70–99)
Glucose-Capillary: 132 mg/dL — ABNORMAL HIGH (ref 70–99)
Glucose-Capillary: 132 mg/dL — ABNORMAL HIGH (ref 70–99)
Glucose-Capillary: 112 mg/dL — ABNORMAL HIGH (ref 70–99)

## 2022-10-05 MED ORDER — PROSOURCE TF20 ENFIT COMPATIBL EN LIQD
60.0000 mL | Freq: Two times a day (BID) | ENTERAL | Status: DC
  Administered 2022-10-05 – 2022-10-12 (×10): 60 mL
  Filled 2022-10-05 (×10): qty 60

## 2022-10-05 NOTE — Progress Notes (Signed)
Nutrition Follow-up  DOCUMENTATION CODES:   Severe malnutrition in context of chronic illness  INTERVENTION:   - Pt currently receiving 30 ml sodium chloride 0.9% flushes per tube q 8 hours which provides only 0.81 grams of NaCl daily. To address ongoing hyponatremia, consider increasing frequency of 30 ml sodium chloride 0.9% flushes to q 3 hours to provide a total of 2.16 grams of NaCl daily. Do not recommend decreasing free water flushes. Recommend monitoring sodium labs daily.  Continue tube feeds via J-tube: - Osmolite 1.5 @ 75 ml/hr (1800 ml/day) - Increase PROSource TF20 60 ml to BID - Free water flushes of 150 ml q 6 hours  Adjusted tube feeding regimen provides 2860 kcal, 153 grams of protein, and 1372 ml of H2O.  Total free water with current flushes: 1972 ml  NUTRITION DIAGNOSIS:   Severe Malnutrition related to chronic illness (esophageal cancer) as evidenced by moderate fat depletion, severe muscle depletion, percent weight loss (25.9% weight loss in less than 1 year).  Ongoing, being addressed via TF  GOAL:   Patient will meet greater than or equal to 90% of their needs  Met via TF  MONITOR:   Labs, Weight trends, TF tolerance, Skin  REASON FOR ASSESSMENT:   Consult Enteral/tube feeding initiation and management  ASSESSMENT:   61 y.o. male admits related to SOB and generalized weakness. PMH includes esophageal cancer. Pt is currently receiving medical management for fevers and failure to thrive.  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 Pt remains NPO with tube feeds infusing at  goal rate via J-tube. Discussed pt with RN who reports pt is tolerating tube feeds well. RD to increase PROSource from daily to BID to provide additional kcal and protein.  Admit weight: 80.5 kg Current weight: 77.7 kg  Current TF: Osmolite 1.5 @ 75 ml/hr, PROSource TF20 60 ml daily, free water flushes of 150 ml q 6 hours, sodium chloride flush 30 ml q 8 hours  Medications reviewed and include: nutrisource fiber BID, IV protonix, IV diflucan, IV abx  Labs from 10/04/22 reviewed: sodium 130, BUN 24 CBG's: 102-141 x 24 hours  UOP: 550 ml x 24 hours 19 Fr R chest JP drain 1: 10 ml x 24 hours 19 Fr R chest JP drain 2: 0 ml x 24 hours Chest tube: 160 ml x 24 hours I/O's: +4.0 L since admit  Diet Order:   Diet Order             Diet NPO time specified  Diet effective now                   EDUCATION NEEDS:   No education needs have been identified at this time  Skin:  Skin Assessment: Skin Integrity Issues: Incisions: right chest x 3  Last BM:  10/05/22 medium type 7  Height:   Ht Readings from Last 1 Encounters:  09/18/2022 _0  (1.778 m)    Weight:   Wt Readings from Last 1 Encounters:  10/05/22 77.7 kg    BMI:  Body mass index is 24.58 kg/m.  Estimated Nutritional Needs:   Kcal:  2400-2700 kcals  Protein:  125-150 g  Fluid:  >/=  2 L    Gustavus Bryant, MS, RD, LDN Inpatient Clinical Dietitian Please see AMiON for contact information.

## 2022-10-05 NOTE — Progress Notes (Signed)
      [address]       [place] [address]             [phone]       7 Days Post-Op Procedure(s) (LRB): ESOPHAGOGASTRODUODENOSCOPY (EGD) WITH PROPOFOL (N/A) ESOPHAGEAL STENT PLACEMENT (N/A) STENT REMOVAL  Subjective: He is anxiously awaiting transfer to [facility]  Objective: Vital signs in last 24 hours: Temp:  [98.1 F (36.7 C)-98.6 F (37 C)] 98.1 F (36.7 C) (12/20 0333) Pulse Rate:  [85-100] 89 (12/20 0333) Cardiac Rhythm: Sinus tachycardia (12/19 1900) Resp:  [17-25] 19 (12/20 0349) BP: (94-114)/(62-74) 114/62 (12/20 0333) SpO2:  [96 %-98 %] 98 % (12/20 0333) Weight:  [77.7 kg] 77.7 kg (12/20 0540)     Intake/Output from previous day: 12/19 0701 - 12/20 0700 In: 2532.2 [NG/GT:1640; IV Piggyback:832.2] Out: 720 [Urine:550; Drains:10; Chest Tube:160]   Physical Exam:  Cardiovascular: RRR Pulmonary: Clear to auscultation on left and diminished on right base Abdomen: Soft, non tender, bowel sounds present. Extremities: No LE edema Wounds: J tube site clean and dry. Chest tube wound mostly clean and dry. Right lateral wound dehisced, portion of suture visible.  Right chest tube and JP drain: Chest tube to suction  Lab Results: CBC: Recent Labs    10/03/22 0555  WBC 9.1  HGB 9.9*  HCT 30.7*  PLT 453*    BMET:  Recent Labs    10/03/22 0555 10/04/22 0524  NA 131* 130*  K 4.6 4.5  CL 93* 95*  CO2 27 28  GLUCOSE 143* 153*  BUN 17 24*  CREATININE 0.67 0.70  CALCIUM 9.7 9.0     PT/INR: No results for input(s): "LABPROT", "INR" in the last 72 hours. ABG:  INR: Will add last result for INR, ABG once components are confirmed Will add last 4 CBG results once components are confirmed  Assessment/Plan:  1. CV - Previously, ST. Continues to be SR . 2.  Pulmonary - On room air. Chest tube output 160 cc last 12 hours.  JP drain with scant output (10 cc) last 24 hours.  Encourage incentive spirometer 3. CBGs 125/138/141.  No history  of DM but on tube feedings. 4. GI-NPO. Tube feedings at 75 ml/hr.Pre albumin this am improved to 18. S/p upper GI endoscopy. Pre existing esophageal stent removed, per report, multiple fistulas. Esophageal prosthesis placed.  5. Mild hyponatremia- Sodium this am 130. He has had hyponatremia since admission. Stopped scheduled free water 12/11 but sodium remained decreased.Appreciate nutrition's assistance.   6. Anemia of chronic disease- Last H and H stable at 8.1 and 24.5 7. ID-on Zosyn and Diflucan for esophageal anastomotic leak., E. Coli from right pleural fluid. WBC yesterday decreased to 9,100. 8. On Lovenox for DVT prophylaxis 9. Deconditioned-PT/OT. Must ambulate 10. Awaiting transfer to [place] 10/05/2022,6:55 AM

## 2022-10-05 NOTE — Progress Notes (Addendum)
Mobility Specialist Progress Note   10/05/22 1130  Mobility  Activity Ambulated with assistance in hallway  Level of Assistance Standby assist, set-up cues, supervision of patient - no hands on  Assistive Device Four wheel walker  Distance Ambulated (ft) 200 ft  Range of Motion/Exercises Active;All extremities  Activity Response Tolerated well   Pre Ambulation:  HR 85, SpO2 99% RA During Ambulation: HR 129 Post Ambulation: HR 88  Patient received in supine eager and motivated to participate. Progressing well with mobility by exhibiting a significant improvement for activity tolerance, was able to extend gait distance. Was independent for bed mobility and stood with minA + cues for hand placement. Ambulated supervision level with steady gait. HR peaked at 129. Returned to room without complaint or incident. Was left in supine with all needs met, call bell in reach.   [name] [name], BS EXP Mobility Specialist Please contact via SecureChat or Rehab office at [phone]

## 2022-10-05 NOTE — Telephone Encounter (Signed)
ERROR

## 2022-10-06 LAB — GLUCOSE, CAPILLARY
Glucose-Capillary: 130 mg/dL — ABNORMAL HIGH (ref 70–99)
Glucose-Capillary: 153 mg/dL — ABNORMAL HIGH (ref 70–99)
Glucose-Capillary: 118 mg/dL — ABNORMAL HIGH (ref 70–99)
Glucose-Capillary: 128 mg/dL — ABNORMAL HIGH (ref 70–99)
Glucose-Capillary: 125 mg/dL — ABNORMAL HIGH (ref 70–99)
Glucose-Capillary: 101 mg/dL — ABNORMAL HIGH (ref 70–99)

## 2022-10-06 MED ORDER — PROSOURCE TF20 ENFIT COMPATIBL EN LIQD: 60.0000 mL | Freq: Two times a day (BID) | ENTERAL | Status: AC

## 2022-10-06 NOTE — Progress Notes (Signed)
Physical Therapy Treatment Patient Details Name: [name] [name] MRN: [id number] DOB: 1960-10-25 Today's Date: 10/06/2022   History of Present Illness Pt is a 61 y.o. 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 Workup for anastomotic leak, to OR for stent repositioning and pleural space drainage 12/4. Pt with persistent purulent drainage, concern for persistent leak; barium swallow 12/11 showed R>L leak within upper mediastinum. Other PMH includes radiation therapy (04/2022-06/2022).    PT Comments    Pt able to progress to ambulating with a rollator up to ~250 ft during one bout with x1 standing rest break today. Pt can be self limiting at times, needing encouragement to try to progress past doing only x2 sit <> stand reps in a row and to perform his exercises and mobilize more frequently. Will continue to follow acutely. Current recommendations remain appropriate.    Recommendations for follow up therapy are one component of a multi-disciplinary discharge planning process, led by the attending physician.  Recommendations may be updated based on patient status, additional functional criteria and insurance authorization.  Follow Up Recommendations  Home health PT     Assistance Recommended at Discharge Intermittent Supervision/Assistance  Patient can return home with the following A little help with bathing/dressing/bathroom;Assistance with cooking/housework;Assist for transportation;Help with stairs or ramp for entrance;A little help with walking and/or transfers   Equipment Recommendations  Rollator (4 wheels);BSC/3in1    Recommendations for Other Services       Precautions / Restrictions Precautions Precautions: Fall;Other (comment) Precaution Comments: R-side chest tube, JP drain, peg, watch HR Restrictions Weight  Bearing Restrictions: No     Mobility  Bed Mobility Overal bed mobility: Needs Assistance Bed Mobility: Supine to Sit, Sit to Supine     Supine to sit: Supervision Sit to supine: Supervision   General bed mobility comments: supervision for safety and line management with pt using bed rails and HOB elevated for bed mobility    Transfers Overall transfer level: Needs assistance Equipment used: Rollator (4 wheels) Transfers: Sit to/from Stand Sit to Stand: Min guard           General transfer comment: Pt applying rollator brakes prior to transfer without needing cues. Pt did need cues to push up from bed instead of pulling up on rollator though. Min guard for safety and line management. x5 reps from EOB, x1 from commode    Ambulation/Gait Ambulation/Gait assistance: Min guard Gait Distance (Feet): 250 Feet (x2 bouts of ~250 ft > ~23 ft) Assistive device: Rollator (4 wheels) Gait Pattern/deviations: Step-through pattern, Decreased stride length, Trunk flexed Gait velocity: Decreased Gait velocity interpretation: <1.8 ft/sec, indicate of risk for recurrent falls   General Gait Details: Pt requiring one standing rest break, min guard overall for safety, good awareness of lines/safety, no LOB. Cues provided to improve upright posture with good success noted.   Stairs             Wheelchair Mobility    Modified Rankin (Stroke Patients Only)       Balance Overall balance assessment: Needs assistance Sitting-balance support: No upper extremity supported Sitting balance-Leahy Scale: Good     Standing balance support: During functional activity, No upper extremity supported, Bilateral upper extremity supported Standing balance-Leahy Scale: Fair Standing balance comment: Able to stand without UE support but reliant on rollator for gait  Cognition Arousal/Alertness: Awake/alert Behavior During Therapy: Flat affect Overall  Cognitive Status: Within Functional Limits for tasks assessed                                 General Comments: pt needing cues to slow down to manage lines when urgently going to bathroom but otherwise aware of line        Exercises Other Exercises Other Exercises: sit <> stand 2 sets x2 reps    General Comments General comments (skin integrity, edema, etc.): HR up to 145 bpm; encouraged more frequent mobility and to perform HEP      Pertinent Vitals/Pain Pain Assessment Pain Assessment: Faces Faces Pain Scale: Hurts a little bit Pain Location: with touch to wounds noted at tape border around chest tube Pain Descriptors / Indicators: Discomfort, Tender Pain Intervention(s): Other (comment) (notified RN, applied xeroform with foam dressing)    Home Living                          Prior Function            PT Goals (current goals can now be found in the care plan section) Acute Rehab PT Goals Patient Stated Goal: to go home soon PT Goal Formulation: With patient/family Time For Goal Achievement: 10/20/22 Potential to Achieve Goals: Good Progress towards PT goals: Progressing toward goals    Frequency    Min 3X/week      PT Plan Current plan remains appropriate    Co-evaluation              AM-PAC PT "6 Clicks" Mobility   Outcome Measure  Help needed turning from your back to your side while in a flat bed without using bedrails?: A Little Help needed moving from lying on your back to sitting on the side of a flat bed without using bedrails?: A Little Help needed moving to and from a bed to a chair (including a wheelchair)?: A Little Help needed standing up from a chair using your arms (e.g., wheelchair or bedside chair)?: A Little Help needed to walk in hospital room?: A Little Help needed climbing 3-5 steps with a railing? : A Lot 6 Click Score: 17    End of Session   Activity Tolerance: Patient tolerated treatment  well Patient left: in bed;with call bell/phone within reach;with bed alarm set;with family/visitor present Nurse Communication: Mobility status;Other (comment) (wounds at dressing border) PT Visit Diagnosis: Other abnormalities of gait and mobility (R26.89);Muscle weakness (generalized) (M62.81);Unsteadiness on feet (R26.81)     Time: 2951-8841 PT Time Calculation (min) (ACUTE ONLY): 32 min  Charges:  $Gait Training: 8-22 mins $Therapeutic Exercise: 8-22 mins                     Moishe Spice, PT, DPT Acute Rehabilitation Services  Office: [phone]    Orvan Falconer 10/06/2022, 1:44 PM

## 2022-10-06 NOTE — Progress Notes (Addendum)
      [address]       [place] [address]             [phone]       8 Days Post-Op Procedure(s) (LRB): ESOPHAGOGASTRODUODENOSCOPY (EGD) WITH PROPOFOL (N/A) ESOPHAGEAL STENT PLACEMENT (N/A) STENT REMOVAL  Subjective: Patient sleeping this am and not awakened.  Objective: Vital signs in last 24 hours: Temp:  [97.1 F (36.2 C)-98.6 F (37 C)] 98.6 F (37 C) (12/21 0500) Pulse Rate:  [83-93] 83 (12/21 0333) Cardiac Rhythm: Normal sinus rhythm (12/21 0717) Resp:  [18-24] 23 (12/21 0500) BP: (101-114)/(65-75) 101/66 (12/21 0500) SpO2:  [96 %-99 %] 96 % (12/21 0500) Weight:  [78.7 kg] 78.7 kg (12/21 0416)     Intake/Output from previous day: 12/20 0701 - 12/21 0700 In: 1560 [NG/GT:1500] Out: 1520 [Urine:1450; Chest Tube:70]   Physical Exam:  Cardiovascular: RRR Pulmonary: Clear to auscultation on left and diminished on right base Abdomen: Soft, non tender, bowel sounds present. Extremities: No LE edema Wounds: J tube site clean and dry. Chest tube wound mostly clean and dry. Right lateral wound dressing is clean and dry Right chest tube and JP drain: Chest tube to suction  Lab Results: CBC: No results for input(s): "WBC", "HGB", "HCT", "PLT" in the last 72 hours.  BMET:  Recent Labs    10/04/22 0524  NA 130*  K 4.5  CL 95*  CO2 28  GLUCOSE 153*  BUN 24*  CREATININE 0.70  CALCIUM 9.0     PT/INR: No results for input(s): "LABPROT", "INR" in the last 72 hours. ABG:  INR: Will add last result for INR, ABG once components are confirmed Will add last 4 CBG results once components are confirmed  Assessment/Plan:  1. CV - Previously, ST. Continues to be SR . 2.  Pulmonary - On room air. Chest tube output 70 cc last 12 hours (dark in color).  JP drain (darkish output) with scant output last 24 hours. Encourage incentive spirometer 3. CBGs 132/112/130.  No history of DM but on tube feedings. 4. GI-NPO. Tube feedings at 75 ml/hr.Pre  albumin this am improved to 18. S/p upper GI endoscopy. Pre existing esophageal stent removed, per report, multiple fistulas. Esophageal prosthesis placed.  5. Mild hyponatremia- Sodium this am 130. He has had hyponatremia since admission. Stopped scheduled free water 12/11 but sodium remained decreased.Appreciate nutrition's assistance.  Re check BMET in am 6. Anemia of chronic disease- Last H and H stable at 8.1 and 24.5 7. ID-on Zosyn and Diflucan for esophageal anastomotic leak., E. Coli from right pleural fluid. Last WBC  decreased to 9,100. 8. On Lovenox for DVT prophylaxis 9. Deconditioned-PT/OT. Must ambulate 10. Awaiting transfer to [place] 10/06/2022,7:20 AM   Agree with above Awaiting [facility] transfer  Lajuana Matte

## 2022-10-06 NOTE — Progress Notes (Signed)
New abrasion-type wound noted on patient's right lateral upper chest wall/back directly above tape which is holding dressing in place. There are three lightly raw-looking areas of skin. It does appear to be the shape of tape and almost appears as though the tape from prior dressing may have touched something and caused a reaction with patient's skin.  Foam dressing with xeroform placed over area to aid healing. Continue to monitor.

## 2022-10-07 LAB — BASIC METABOLIC PANEL
Anion gap: 7 (ref 5–15)
BUN: 23 mg/dL (ref 8–23)
CO2: 30 mmol/L (ref 22–32)
Calcium: 9.7 mg/dL (ref 8.9–10.3)
Chloride: 101 mmol/L (ref 98–111)
Creatinine, Ser: 0.69 mg/dL (ref 0.61–1.24)
GFR, Estimated: >60 mL/min (ref >60)
Glucose, Bld: 142 mg/dL — ABNORMAL HIGH (ref 70–99)
Potassium: 4.2 mmol/L (ref 3.5–5.1)
Sodium: 138 mmol/L (ref 135–145)

## 2022-10-07 LAB — CBC
HCT: 26 % — ABNORMAL LOW (ref 39.0–52.0)
Hemoglobin: 8 g/dL — ABNORMAL LOW (ref 13.0–17.0)
MCH: 30.3 pg (ref 26.0–34.0)
MCHC: 30.8 g/dL (ref 30.0–36.0)
MCV: 98.5 fL (ref 80.0–100.0)
Platelets: 258 10*3/uL (ref 150–400)
RBC: 2.64 MIL/uL — ABNORMAL LOW (ref 4.22–5.81)
RDW: 16.5 % — ABNORMAL HIGH (ref 11.5–15.5)
WBC: 5.1 10*3/uL (ref 4.0–10.5)
nRBC: 0 % (ref 0.0–0.2)

## 2022-10-07 LAB — GLUCOSE, CAPILLARY
Glucose-Capillary: 127 mg/dL — ABNORMAL HIGH (ref 70–99)
Glucose-Capillary: 121 mg/dL — ABNORMAL HIGH (ref 70–99)
Glucose-Capillary: 145 mg/dL — ABNORMAL HIGH (ref 70–99)
Glucose-Capillary: 128 mg/dL — ABNORMAL HIGH (ref 70–99)
Glucose-Capillary: 142 mg/dL — ABNORMAL HIGH (ref 70–99)
Glucose-Capillary: 110 mg/dL — ABNORMAL HIGH (ref 70–99)

## 2022-10-07 NOTE — Progress Notes (Signed)
Occupational Therapy Treatment Patient Details Name: [name] [name] MRN: [id number] DOB: [birth year] Today's Date: 10/07/2022   History of present illness Pt is a 61 y.o. 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 Workup for anastomotic leak, to OR for stent repositioning and pleural space drainage 12/4. Pt with persistent purulent drainage, concern for persistent leak; barium swallow 12/11 showed R>L leak within upper mediastinum. Other PMH includes radiation therapy (04/2022-06/2022).   OT comments  Pt awakened for ADL training. Completed seated grooming with set up, ambulating to sink with min guard assist and RW. Provided level 2 theraband and educated in UE exercises for pt to complete 3 x a day. Tolerated well.    Recommendations for follow up therapy are one component of a multi-disciplinary discharge planning process, led by the attending physician.  Recommendations may be updated based on patient status, additional functional criteria and insurance authorization.    Follow Up Recommendations  Home health OT     Assistance Recommended at Discharge Frequent or constant Supervision/Assistance  Patient can return home with the following  A little help with walking and/or transfers;A little help with bathing/dressing/bathroom;Assistance with cooking/housework;Assist for transportation;Help with stairs or ramp for entrance   Equipment Recommendations       Recommendations for Other Services      Precautions / Restrictions Precautions Precautions: Fall;Other (comment) Precaution Comments: R-side chest tube, JP drain, peg, watch HR Restrictions Weight Bearing Restrictions: No       Mobility Bed Mobility Overal bed mobility: Needs Assistance Bed Mobility: Supine to Sit, Sit to Supine     Supine to sit:  Supervision Sit to supine: Supervision        Transfers Overall transfer level: Needs assistance Equipment used: Rolling walker (2 wheels) Transfers: Sit to/from Stand Sit to Stand: Min guard                 Balance     Sitting balance-Leahy Scale: Good       Standing balance-Leahy Scale: Poor Standing balance comment: UE support for gait                           ADL either performed or assessed with clinical judgement   ADL Overall ADL's : Needs assistance/impaired     Grooming: Wash/dry hands;Wash/dry face;Oral care;Sitting;Set up                                      Extremity/Trunk Assessment              Vision       Perception     Praxis      Cognition Arousal/Alertness: Awake/alert Behavior During Therapy: WFL for tasks assessed/performed Overall Cognitive Status: Within Functional Limits for tasks assessed                                          Exercises Exercises: General Upper Extremity General Exercises - Upper Extremity Shoulder Flexion: Strengthening, Both, 10 reps, Theraband, Seated Theraband Level (Shoulder Flexion): Level 2 (Red) Shoulder Horizontal ABduction: Strengthening, Both, 10 reps, Seated, Theraband Theraband Level (Shoulder Horizontal Abduction): Level 2 (Red)  Shoulder Instructions       General Comments      Pertinent Vitals/ Pain       Pain Assessment Pain Assessment: Faces Faces Pain Scale: Hurts a little bit Pain Location: back Pain Descriptors / Indicators: Grimacing, Guarding Pain Intervention(s): Repositioned  Home Living                                          Prior Functioning/Environment              Frequency  Min 2X/week        Progress Toward Goals  OT Goals(current goals can now be found in the care plan section)  Progress towards OT goals: Progressing toward goals  Acute Rehab OT Goals OT Goal Formulation:  With patient Time For Goal Achievement: 10/21/22 Potential to Achieve Goals: Good ADL Goals Pt Will Perform Lower Body Bathing: sit to/from stand;with modified independence Pt Will Perform Lower Body Dressing: with modified independence;sit to/from stand Pt Will Transfer to Toilet: with modified independence;ambulating;bedside commode Pt/caregiver will Perform Home Exercise Program: Both right and left upper extremity;With theraband;With written HEP provided;Independently Additional ADL Goal #1: Pt will verbalize 3 strateiges for Energy conservation Additional ADL Goal #2: Pt will stand without UB support during functional activity with supervision in preparation for standing ADLs.  Plan Discharge plan remains appropriate    Co-evaluation                 AM-PAC OT "6 Clicks" Daily Activity     Outcome Measure   Help from another person eating meals?: Total Help from another person taking care of personal grooming?: A Little Help from another person toileting, which includes using toliet, bedpan, or urinal?: A Little Help from another person bathing (including washing, rinsing, drying)?: A Little Help from another person to put on and taking off regular upper body clothing?: A Little Help from another person to put on and taking off regular lower body clothing?: A Little 6 Click Score: 16    End of Session Equipment Utilized During Treatment: Rolling walker (2 wheels)  OT Visit Diagnosis: Unsteadiness on feet (R26.81);Other abnormalities of gait and mobility (R26.89);Muscle weakness (generalized) (M62.81);Pain   Activity Tolerance Patient tolerated treatment well   Patient Left in bed;with call bell/phone within reach   Nurse Communication          Time: 1440-1505 OT Time Calculation (min): 25 min  Charges: OT General Charges $OT Visit: 1 Visit OT Treatments $Self Care/Home Management : 8-22 mins $Therapeutic Exercise: 8-22 mins  Cleta Alberts, OTR/L Acute  Rehabilitation Services Office: [phone]   Malka So 10/07/2022, 3:19 PM

## 2022-10-07 NOTE — Progress Notes (Addendum)
Pharmacy Antibiotic Note-Follow Up  [name] [name] is a 61 y.o. male admitted on 09/22/2022 with  fever s/p esophagectomy for esophageal cancer .  Pharmacy has been consulted for vancomycin and Zosyn dosing. He is s/p esophagectomy on 09/3 complicated by anastomotic leak with stent placed on 11/13.   He was to have 6 weeks of abx/antifungal from 11/16 >> 12/28, initially with IV Zosyn and fluconazole. This was changed to Augmentin and oral fluconazole on discharge per ID (outpt course may need to change with new culture data). Pleural culture with E coli growing again, now resistant to Unasyn so will need alternative antibiotics at discharge.   12/22: Cr remains stable, WBC WNL, afebrile, and will continue Zosyn and  fluconazole '400mg'$  IV daily (maintenance dose for intraabdominal and esophageal infections).  Plan: Continue Zosyn 3.375 g IV q8h to be infused over 4 hours Continue fluconazole to '400mg'$  IV q24h Monitor renal function and clinical status Follow-up LOT  Height: '5\' 10"'$  (177.8 cm) Weight: 79.7 kg (175 lb 11.3 oz) IBW/kg (Calculated) : 73  Temp (24hrs), Avg:98.1 F (36.7 C), Min:97.8 F (36.6 C), Max:98.5 F (36.9 C)  Recent Labs  Lab 10/01/22 0510 10/03/22 0555 10/04/22 0524 10/07/22 0542  WBC  --  9.1  --  5.1  CREATININE 0.58* 0.67 0.70 0.69     Estimated Creatinine Clearance: 97.6 mL/min (by C-G formula based on SCr of 0.69 mg/dL).    No Known Allergies  Antimicrobials this admission: Vancomycin 12/1 >>12/9 Zosyn 12/1 >>  Fluconazole 12/1 >>  Microbiology results: 12/4 MRSA PCR neg 12/4 body fluid: E Coli - [place], PharmD. [facility] Acute Care PGY-1  10/07/2022 8:13 AM

## 2022-10-07 NOTE — Progress Notes (Addendum)
      [address]       [place] [address]             [phone]       9 Days Post-Op Procedure(s) (LRB): ESOPHAGOGASTRODUODENOSCOPY (EGD) WITH PROPOFOL (N/A) ESOPHAGEAL STENT PLACEMENT (N/A) STENT REMOVAL  Subjective: Patient sleeping this am and awakened.  Objective: Vital signs in last 24 hours: Temp:  [97.8 F (36.6 C)-98.5 F (36.9 C)] 97.8 F (36.6 C) (12/22 0809) Pulse Rate:  [80-100] 80 (12/22 0809) Cardiac Rhythm: Normal sinus rhythm (12/22 0700) Resp:  [12-20] 16 (12/22 0809) BP: (98-109)/(63-71) 104/65 (12/22 0809) SpO2:  [94 %-100 %] 94 % (12/22 0809) Weight:  [79.7 kg] 79.7 kg (12/22 0615)     Intake/Output from previous day: 12/21 0701 - 12/22 0700 In: -  Out: 960 [Urine:950; Chest Tube:10]   Physical Exam:  Cardiovascular: RRR Pulmonary: Clear to auscultation on left and diminished on right base Abdomen: Soft, non tender, bowel sounds present. Extremities: No LE edema Wounds: J tube site clean and dry. Chest tube wound mostly clean and dry. Right lateral wound dressing is clean and dry. Remnant of Vicryl suture removed. Right chest tube and JP drain: Chest tube to suction  Lab Results: CBC: Recent Labs    10/07/22 0542  WBC 5.1  HGB 8.0*  HCT 26.0*  PLT 258    BMET:  Recent Labs    10/07/22 0542  NA 138  K 4.2  CL 101  CO2 30  GLUCOSE 142*  BUN 23  CREATININE 0.69  CALCIUM 9.7     PT/INR: No results for input(s): "LABPROT", "INR" in the last 72 hours. ABG:  INR: Will add last result for INR, ABG once components are confirmed Will add last 4 CBG results once components are confirmed  Assessment/Plan:  1. CV - Previously, ST. Continues to be SR . 2.  Pulmonary - On room air. Chest tube output 10 cc last 12 hours (dark in color).  JP drain (darkish output) with scant output last 24 hours. Encourage incentive spirometer 3. CBGs 101/127/121.  No history of DM but on tube feedings. 4. GI-NPO. Tube feedings  at 75 ml/hr.Pre albumin this am improved to 18. S/p upper GI endoscopy. Pre existing esophageal stent removed, per report, multiple fistulas. Esophageal prosthesis placed.  5. Hyponatremia resolved- Sodium this am up to 138.  Free water adjusted by pharmacy yesterday. 6. Anemia of chronic disease- H and H this am decreased to 8 and 26 7. ID-on Zosyn and Diflucan for esophageal anastomotic leak., E. Coli from right pleural fluid. WBC this am 5,100 8. On Lovenox for DVT prophylaxis 9. Deconditioned-PT/OT. Must ambulate 10. Regarding right lateral open chest wound-nurse at bedside and instructed how to pack with wet to dry 4x4 and tape. 10. Awaiting transfer to [facility] 10/07/2022,9:11 AM      Agree with above Awaiting to transfer. Can have 1 cup of ice per day.  [name] [name]

## 2022-10-07 NOTE — Progress Notes (Signed)
Mobility Specialist Progress Note   10/07/22 1030  Mobility  Activity Ambulated with assistance in hallway  Level of Assistance Standby assist, set-up cues, supervision of patient - no hands on  Assistive Device Four wheel walker  Distance Ambulated (ft) 250 ft  Range of Motion/Exercises Active;All extremities  Activity Response Tolerated well   Patient received in supine and agreeable to participate in mobility. Ambulated supervision level with slow gait. Returned to room without complaint or incident. Was left in supine with all needs met, call bell in reach.   [name] [name], BS EXP Mobility Specialist Please contact via SecureChat or Rehab office at [phone]

## 2022-10-08 LAB — GLUCOSE, CAPILLARY
Glucose-Capillary: 145 mg/dL — ABNORMAL HIGH (ref 70–99)
Glucose-Capillary: 147 mg/dL — ABNORMAL HIGH (ref 70–99)
Glucose-Capillary: 114 mg/dL — ABNORMAL HIGH (ref 70–99)
Glucose-Capillary: 121 mg/dL — ABNORMAL HIGH (ref 70–99)
Glucose-Capillary: 104 mg/dL — ABNORMAL HIGH (ref 70–99)

## 2022-10-08 NOTE — Plan of Care (Signed)
  Problem: Education: Goal: Knowledge of General Education information will improve Description: Including pain rating scale, medication(s)/side effects and non-pharmacologic comfort measures Outcome: Progressing   Problem: Clinical Measurements: Goal: Respiratory complications will improve Outcome: Progressing Goal: Cardiovascular complication will be avoided Outcome: Progressing   Problem: Activity: Goal: Risk for activity intolerance will decrease Outcome: Progressing   Problem: Nutrition: Goal: Adequate nutrition will be maintained Outcome: Progressing   Problem: Elimination: Goal: Will not experience complications related to urinary retention Outcome: Progressing   Problem: Pain Managment: Goal: General experience of comfort will improve Outcome: Progressing   Problem: Safety: Goal: Ability to remain free from injury will improve Outcome: Progressing

## 2022-10-08 NOTE — Progress Notes (Signed)
      [address]       [place] [address]             [phone]       10 Days Post-Op Procedure(s) (LRB): ESOPHAGOGASTRODUODENOSCOPY (EGD) WITH PROPOFOL (N/A) ESOPHAGEAL STENT PLACEMENT (N/A) STENT REMOVAL  Subjective: Patient states did sit in chair yesterday.  Objective: Vital signs in last 24 hours: Temp:  [97.8 F (36.6 C)-98.7 F (37.1 C)] 97.9 F (36.6 C) (12/23 1137) Pulse Rate:  [77-100] 77 (12/23 1137) Cardiac Rhythm: Normal sinus rhythm (12/23 0700) Resp:  [19-21] 20 (12/23 1137) BP: (93-118)/(67-75) 104/72 (12/23 1137) SpO2:  [91 %-100 %] 100 % (12/23 1137) Weight:  [79.9 kg] 79.9 kg (12/23 0323)     Intake/Output from previous day: 12/22 0701 - 12/23 0700 In: 6493.2 [P.O.:60; NG/GT:5123.1; IV Piggyback:1310.1] Out: 1220 [Urine:1200; Drains:10; Chest Tube:10]   Physical Exam:  Cardiovascular: RRR Pulmonary: Clear to auscultation on left and diminished on right base Abdomen: Soft, non tender, bowel sounds present. Extremities: No LE edema Wounds: J tube site clean and dry. Chest tube wound mostly clean and dry. Right lateral wound is mostly dry. Minor superficial sloughy like tissue on skin edge Right chest tube and JP drain: Chest tube to suction  Lab Results: CBC: Recent Labs    10/07/22 0542  WBC 5.1  HGB 8.0*  HCT 26.0*  PLT 258    BMET:  Recent Labs    10/07/22 0542  NA 138  K 4.2  CL 101  CO2 30  GLUCOSE 142*  BUN 23  CREATININE 0.69  CALCIUM 9.7     PT/INR: No results for input(s): "LABPROT", "INR" in the last 72 hours. ABG:  INR: Will add last result for INR, ABG once components are confirmed Will add last 4 CBG results once components are confirmed  Assessment/Plan:  1. CV - Previously, ST. Continues to be SR . 2.  Pulmonary - On room air. Chest tube output 10 cc last 12 hours (dark in color).  JP drain (darkish output) with scant output last 24 hours. Encourage incentive spirometer 3. CBGs  127/153/143.  No history of DM but on tube feedings. 4. GI-NPO. Tube feedings at 75 ml/hr.Pre albumin this am improved to 18. S/p upper GI endoscopy. Pre existing esophageal stent removed, per report, multiple fistulas. Esophageal prosthesis placed.  5. Hyponatremia resolved- Sodium this am up to 138.  Free water adjusted by pharmacy yesterday. 6. Anemia of chronic disease- Last H and H decreased to 8 and 26 7. ID-on Zosyn and Diflucan for esophageal anastomotic leak., E. Coli from right pleural fluid. Last WBC  5,100 8. On Lovenox for DVT prophylaxis 9. Deconditioned-PT/OT. Must ambulate 10. Regarding right lateral open chest wound-continue to pack with wet to dry 4x4 and tape. 10. Awaiting transfer to [facility];likely after Monday  Kenadee Gates M ZimmermanPA-C 10/08/2022,12:52 PM

## 2022-10-08 NOTE — Progress Notes (Signed)
Mobility Specialist Progress Note:   10/08/22 1042  Mobility  Activity Refused mobility   Pt refused mobility d/t fatigue from just waking up. Will f/u as able.   Andrey Campanile Mobility Specialist Please contact via SecureChat or  Rehab office at [phone]

## 2022-10-09 LAB — GLUCOSE, CAPILLARY
Glucose-Capillary: 126 mg/dL — ABNORMAL HIGH (ref 70–99)
Glucose-Capillary: 127 mg/dL — ABNORMAL HIGH (ref 70–99)
Glucose-Capillary: 131 mg/dL — ABNORMAL HIGH (ref 70–99)
Glucose-Capillary: 143 mg/dL — ABNORMAL HIGH (ref 70–99)
Glucose-Capillary: 150 mg/dL — ABNORMAL HIGH (ref 70–99)
Glucose-Capillary: 153 mg/dL — ABNORMAL HIGH (ref 70–99)
Glucose-Capillary: 164 mg/dL — ABNORMAL HIGH (ref 70–99)

## 2022-10-09 NOTE — Progress Notes (Signed)
Mobility Specialist - Progress Note   10/09/22 1012  Mobility  Activity Refused mobility   Pt refused mobility. No specific reason given will f/u if time permits.   [name] [name]  Mobility Specialist Please contact via [facility] or Rehab office at [phone]

## 2022-10-09 NOTE — Plan of Care (Signed)
  Problem: Clinical Measurements: Goal: Respiratory complications will improve Outcome: Progressing Goal: Cardiovascular complication will be avoided Outcome: Progressing   Problem: Nutrition: Goal: Adequate nutrition will be maintained Outcome: Progressing   Problem: Elimination: Goal: Will not experience complications related to urinary retention Outcome: Progressing   Problem: Pain Managment: Goal: General experience of comfort will improve Outcome: Progressing

## 2022-10-10 LAB — CBC
HCT: 25.7 % — ABNORMAL LOW (ref 39.0–52.0)
Hemoglobin: 8 g/dL — ABNORMAL LOW (ref 13.0–17.0)
MCH: 30.8 pg (ref 26.0–34.0)
MCHC: 31.1 g/dL (ref 30.0–36.0)
MCV: 98.8 fL (ref 80.0–100.0)
Platelets: 253 10*3/uL (ref 150–400)
RBC: 2.6 MIL/uL — ABNORMAL LOW (ref 4.22–5.81)
RDW: 17.2 % — ABNORMAL HIGH (ref 11.5–15.5)
WBC: 7.4 10*3/uL (ref 4.0–10.5)
nRBC: 0 % (ref 0.0–0.2)

## 2022-10-10 LAB — GLUCOSE, CAPILLARY
Glucose-Capillary: 126 mg/dL — ABNORMAL HIGH (ref 70–99)
Glucose-Capillary: 134 mg/dL — ABNORMAL HIGH (ref 70–99)
Glucose-Capillary: 139 mg/dL — ABNORMAL HIGH (ref 70–99)
Glucose-Capillary: 144 mg/dL — ABNORMAL HIGH (ref 70–99)
Glucose-Capillary: 190 mg/dL — ABNORMAL HIGH (ref 70–99)
Glucose-Capillary: 95 mg/dL (ref 70–99)

## 2022-10-10 LAB — BASIC METABOLIC PANEL
Anion gap: 5 (ref 5–15)
BUN: 17 mg/dL (ref 8–23)
CO2: 29 mmol/L (ref 22–32)
Calcium: 9.4 mg/dL (ref 8.9–10.3)
Chloride: 101 mmol/L (ref 98–111)
Creatinine, Ser: 0.6 mg/dL — ABNORMAL LOW (ref 0.61–1.24)
GFR, Estimated: >60 mL/min (ref >60)
Glucose, Bld: 148 mg/dL — ABNORMAL HIGH (ref 70–99)
Potassium: 4.2 mmol/L (ref 3.5–5.1)
Sodium: 135 mmol/L (ref 135–145)

## 2022-10-10 NOTE — Progress Notes (Addendum)
      [address]       [place] [address]             [phone]       12 Days Post-Op Procedure(s) (LRB): ESOPHAGOGASTRODUODENOSCOPY (EGD) WITH PROPOFOL (N/A) ESOPHAGEAL STENT PLACEMENT (N/A) STENT REMOVAL  Subjective: Patient watching tv this am.  Objective: Vital signs in last 24 hours: Temp:  [98.2 F (36.8 C)-98.9 F (37.2 C)] 98.6 F (37 C) (12/25 0723) Pulse Rate:  [90-96] 96 (12/25 0343) Cardiac Rhythm: Normal sinus rhythm (12/25 0839) Resp:  [21-24] 21 (12/25 0723) BP: (111-119)/(70-78) 119/76 (12/25 0723) SpO2:  [95 %-99 %] 96 % (12/25 0723) Weight:  [78.2 kg] 78.2 kg (12/25 0343)     Intake/Output from previous day: 12/24 0701 - 12/25 0700 In: 120 [P.O.:120] Out: 1855 [Urine:1850; Chest Tube:5]   Physical Exam:  Cardiovascular: RRR Pulmonary: Clear to auscultation on left and diminished on right base Abdomen: Soft, non tender, bowel sounds present. Extremities: No LE edema Wounds: J tube site clean and dry. Chest tube wound mostly clean and dry. Right lateral wound is mostly dry. Right chest wound clean and dry this am Right chest tube and JP drain: Chest tube to suction  Lab Results: CBC: Recent Labs    10/10/22 0514  WBC 7.4  HGB 8.0*  HCT 25.7*  PLT 253    BMET:  Recent Labs    10/10/22 0514  NA 135  K 4.2  CL 101  CO2 29  GLUCOSE 148*  BUN 17  CREATININE 0.60*  CALCIUM 9.4     PT/INR: No results for input(s): "LABPROT", "INR" in the last 72 hours. ABG:  INR: Will add last result for INR, ABG once components are confirmed Will add last 4 CBG results once components are confirmed  Assessment/Plan:  1. CV - Previously, ST. Continues to be SR . 2.  Pulmonary - On room air. Chest tube output 10 cc last 12 hours (dark in color).  JP drain (darkish output) with scant output last 24 hours. Encourage incentive spirometer 3. CBGs 127/153/143.  No history of DM but on tube feedings. 4. GI-NPO. Tube feedings at  75 ml/hr.Pre albumin this am improved to 18. S/p upper GI endoscopy. Pre existing esophageal stent removed, per report, multiple fistulas. Esophageal prosthesis placed.  5. Hyponatremia resolved- Sodium this am up to 138.  Free water adjusted by pharmacy yesterday. 6. Anemia of chronic disease- Last H and H decreased to 8 and 26 7. ID-on Zosyn and Diflucan for esophageal anastomotic leak., E. Coli from right pleural fluid. Last WBC  5,100 8. On Lovenox for DVT prophylaxis 9. Deconditioned-PT/OT. Must ambulate 10. Regarding right lateral open chest wound-continue to pack with wet to dry 4x4 and tape. 10. Awaiting transfer to [facility];Dr. Kipp Brood to contact again in am  Donielle M ZimmermanPA-C 10/10/2022,10:22 AM     DR Olvin Rohr ADDENDUM WC 7 No sig updates [facility] when available

## 2022-10-10 NOTE — Progress Notes (Signed)
Mobility Specialist Progress Note   10/10/22 1110  Mobility  Activity Ambulated with assistance to bathroom;Ambulated with assistance in hallway  Level of Assistance Standby assist, set-up cues, supervision of patient - no hands on  Assistive Device Four wheel walker  Distance Ambulated (ft) 300 ft (180+120)  Activity Response Tolerated well  $Mobility charge 1 Mobility   Pre Mobility: 101 HR, 94% SpO2 on RA  During Mobility: 132 HR, 89% SpO2 on RA  Post Mobility: 111 HR, 97% SpO2 on RA   Received pt in bed having no complaints and agreeable. Requesting to use BR prior to ambulation, no physical assisted needed for transfer, successful BM. During ambulation, x1 seated rest break d/t SOB and requiring min cues on Rollator mechanics when pt trying to prematurely sit down. Returned back to bed w/o fault, call bell in reach and slight cough.  Holland Falling Mobility Specialist Please contact via SecureChat or  Rehab office at [phone]

## 2022-10-11 LAB — GLUCOSE, CAPILLARY
Glucose-Capillary: 131 mg/dL — ABNORMAL HIGH (ref 70–99)
Glucose-Capillary: 134 mg/dL — ABNORMAL HIGH (ref 70–99)
Glucose-Capillary: 144 mg/dL — ABNORMAL HIGH (ref 70–99)
Glucose-Capillary: 156 mg/dL — ABNORMAL HIGH (ref 70–99)
Glucose-Capillary: 157 mg/dL — ABNORMAL HIGH (ref 70–99)
Glucose-Capillary: 160 mg/dL — ABNORMAL HIGH (ref 70–99)

## 2022-10-11 NOTE — Progress Notes (Signed)
     [address]       [place] [address]             [phone]       No events  Vitals:   10/11/22 0420 10/11/22 0729  BP: 127/82 124/75  Pulse: 92   Resp: 18 (!) 23  Temp: 98 F (36.7 C) 98 F (36.7 C)  SpO2: 97% 97%   Alert NAD Sinus  EWOB CT to suction Thin fluid output Wound stable     Latest Ref Rng & Units 10/10/2022    5:14 AM 10/07/2022    5:42 AM 10/03/2022    5:55 AM  CBC  WBC 4.0 - 10.5 K/uL 7.4  5.1  9.1   Hemoglobin 13.0 - 17.0 g/dL 8.0  8.0  9.9   Hematocrit 39.0 - 52.0 % 25.7  26.0  30.7   Platelets 150 - 400 K/uL 253  258  453    S/p Esophagectomy with anastomotic leak Will check nutrition labs Awaiting transfer to [facility] for endoscopic closure of leak  [name] [name] O [name] [name]

## 2022-10-11 NOTE — Progress Notes (Addendum)
      [address]       [place] [address]             [phone]       13 Days Post-Op Procedure(s) (LRB): ESOPHAGOGASTRODUODENOSCOPY (EGD) WITH PROPOFOL (N/A) ESOPHAGEAL STENT PLACEMENT (N/A) STENT REMOVAL  Subjective: Patient awake as just spoke with Dr. Kipp Brood.  Objective: Vital signs in last 24 hours: Temp:  [98 F (36.7 C)-98.5 F (36.9 C)] 98 F (36.7 C) (12/26 0729) Pulse Rate:  [92-115] 92 (12/26 0420) Cardiac Rhythm: Normal sinus rhythm (12/26 0730) Resp:  [16-23] 23 (12/26 0729) BP: (95-127)/(73-82) 124/75 (12/26 0729) SpO2:  [97 %-98 %] 97 % (12/26 0729)     Intake/Output from previous day: 12/25 0701 - 12/26 0700 In: 6772.2 [P.O.:60; NG/GT:6010; IV Piggyback:702.2] Out: 1450 [Urine:1450]   Physical Exam:  Cardiovascular: RRR Pulmonary: Clear to auscultation on left and diminished on right base Abdomen: Soft, non tender, bowel sounds present. Extremities: No LE edema Wounds: J tube site clean and dry. Chest tube wound mostly clean and dry. Right lateral wound is mostly dry. Right chest wound clean and dry this am Right chest tube and JP drain: Chest tube to suction  Lab Results: CBC: Recent Labs    10/10/22 0514  WBC 7.4  HGB 8.0*  HCT 25.7*  PLT 253    BMET:  Recent Labs    10/10/22 0514  NA 135  K 4.2  CL 101  CO2 29  GLUCOSE 148*  BUN 17  CREATININE 0.60*  CALCIUM 9.4     PT/INR: No results for input(s): "LABPROT", "INR" in the last 72 hours. ABG:  INR: Will add last result for INR, ABG once components are confirmed Will add last 4 CBG results once components are confirmed  Assessment/Plan:  1. CV - Previously, ST. Continues to be SR . 2.  Pulmonary - On room air. Chest tube with scant output  (dark in color).  JP drain (darkish output) with scant output last 24 hours. Encourage incentive spirometer 3. CBGs 95/134/157.  No history of DM but on tube feedings. 4. GI-NPO. Tube feedings at 75 ml/hr.Pre  albumin this am improved to 18. S/p upper GI endoscopy. Pre existing esophageal stent removed, per report, multiple fistulas. Esophageal prosthesis placed.  5. Anemia of chronic disease- Last H and H decreased to 8 and 26 6. ID-on Zosyn and Diflucan for esophageal anastomotic leak., E. Coli from right pleural fluid. Last WBC  5,100 7. On Lovenox for DVT prophylaxis 8. Deconditioned-PT/OT. Must ambulate 9. Regarding right lateral open chest wound-continue to pack with wet to dry 4x4 and tape. 10. Awaiting transfer to [facility];Dr. Kipp Brood to contact again in am  Donielle M ZimmermanPA-C 10/11/2022,7:48 AM

## 2022-10-11 NOTE — Progress Notes (Signed)
Physical Therapy Treatment Patient Details Name: [name] [name] MRN: [id number] DOB: 1961/04/18 Today's Date: 10/11/2022   History of Present Illness Pt is a 61 y.o. 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 Workup for anastomotic leak, to OR for stent repositioning and pleural space drainage 12/4. Pt with persistent purulent drainage, concern for persistent leak; barium swallow 12/11 showed R>L leak within upper mediastinum. Awaiting transfer to [facility] for endoscopic closure of leak. Other PMH includes radiation therapy (04/2022-06/2022).   PT Comments    Pt progressing with mobility. Today's session focused on ambulation for improving strength and activity tolerance. Notable SOB and tachycardia with activity; educ on activity pacing and importance of more frequent OOB mobility. Pt with improving affect and motivation to participate. Pt remains limited by generalized weakness, decreased activity tolerance, and impaired balance strategies/postural reactions. Will continue to follow acutely to address established goals.  HR 110s-151 with activity    Recommendations for follow up therapy are one component of a multi-disciplinary discharge planning process, led by the attending physician.  Recommendations may be updated based on patient status, additional functional criteria and insurance authorization.  Follow Up Recommendations  Home health PT     Assistance Recommended at Discharge Intermittent Supervision/Assistance  Patient can return home with the following A little help with bathing/dressing/bathroom;Assistance with cooking/housework;Assist for transportation;Help with stairs or ramp for entrance;A little help with walking and/or transfers   Equipment Recommendations   (pt declines rollator)    Recommendations for Other  Services       Precautions / Restrictions Precautions Precautions: Fall;Other (comment) Precaution Comments: R-side chest tube, JP drain, peg, watch HR Restrictions Weight Bearing Restrictions: No     Mobility  Bed Mobility Overal bed mobility: Modified Independent Bed Mobility: Supine to Sit, Sit to Supine                Transfers Overall transfer level: Needs assistance Equipment used: Rollator (4 wheels) Transfers: Sit to/from Stand Sit to Stand: Supervision           General transfer comment: good awareness of rollator brakes this session, reliant on momentum to power up pulling on rollator to stand for 2x sit<>stand from EOB; good technique with seated rest break on rollator seat; assist for line management    Ambulation/Gait Ambulation/Gait assistance: Supervision Gait Distance (Feet): 460 Feet Assistive device: Rollator (4 wheels) Gait Pattern/deviations: Step-through pattern, Decreased stride length, Trunk flexed       General Gait Details: steady gait with rollator and supervision for safety/lines; cues for seated rest break secondary to tachycardia with HR up to 151 (pt reports able to "feel it"); notable DOE   Chief Strategy Officer    Modified Rankin (Stroke Patients Only)       Balance Overall balance assessment: Needs assistance Sitting-balance support: No upper extremity supported, Feet supported Sitting balance-Leahy Scale: Good       Standing balance-Leahy Scale: Fair Standing balance comment: can static stand without UE support                            Cognition Arousal/Alertness: Awake/alert Behavior During Therapy: WFL for tasks assessed/performed, Flat affect Overall Cognitive Status: Within Functional Limits for tasks assessed  General Comments: improving cognition and affect        Exercises      General Comments General comments (skin  integrity, edema, etc.): HR up to 151 with activity, encouraged standing/seated rest break when pt able to "feel" that it's high. encouraged more frequent seated EOB activity (pt reports sitting on side of bed "a couple" times/day)      Pertinent Vitals/Pain Pain Assessment Pain Assessment: Faces Faces Pain Scale: Hurts a little bit Pain Location: generalized Pain Descriptors / Indicators: Tiring Pain Intervention(s): Monitored during session    Home Living                          Prior Function            PT Goals (current goals can now be found in the care plan section) Progress towards PT goals: Progressing toward goals    Frequency    Min 3X/week      PT Plan Current plan remains appropriate    Co-evaluation              AM-PAC PT "6 Clicks" Mobility   Outcome Measure  Help needed turning from your back to your side while in a flat bed without using bedrails?: A Little Help needed moving from lying on your back to sitting on the side of a flat bed without using bedrails?: A Little Help needed moving to and from a bed to a chair (including a wheelchair)?: A Little Help needed standing up from a chair using your arms (e.g., wheelchair or bedside chair)?: A Little Help needed to walk in hospital room?: A Little Help needed climbing 3-5 steps with a railing? : A Little 6 Click Score: 18    End of Session Equipment Utilized During Treatment:  (pt declined gait belt due to chest tube) Activity Tolerance: Patient tolerated treatment well Patient left: in bed;with call bell/phone within reach;with family/visitor present Nurse Communication: Mobility status PT Visit Diagnosis: Other abnormalities of gait and mobility (R26.89);Muscle weakness (generalized) (M62.81);Unsteadiness on feet (R26.81)     Time: 1610-9604 PT Time Calculation (min) (ACUTE ONLY): 22 min  Charges:  $Therapeutic Exercise: 8-22 mins                     [name], PT,  DPT Acute Rehabilitation Services  Personal: [place] [facility] Office: [place] 10/11/2022, 11:21 AM

## 2022-10-12 ENCOUNTER — Inpatient Hospital Stay: Payer: No Typology Code available for payment source | Admitting: Internal Medicine

## 2022-10-12 ENCOUNTER — Inpatient Hospital Stay (HOSPITAL_COMMUNITY): Payer: No Typology Code available for payment source

## 2022-10-12 LAB — GLUCOSE, CAPILLARY
Glucose-Capillary: 149 mg/dL — ABNORMAL HIGH (ref 70–99)
Glucose-Capillary: 175 mg/dL — ABNORMAL HIGH (ref 70–99)

## 2022-10-12 LAB — ALBUMIN: Albumin: 2.1 g/dL — ABNORMAL LOW (ref 3.5–5.0)

## 2022-10-12 LAB — PROTEIN, TOTAL: Total Protein: 6.2 g/dL — ABNORMAL LOW (ref 6.5–8.1)

## 2022-10-12 LAB — PREALBUMIN: Prealbumin: 22 mg/dL (ref 18–38)

## 2022-10-12 MED ORDER — IOHEXOL 350 MG/ML SOLN
75.0000 mL | Freq: Once | INTRAVENOUS | Status: AC | PRN
  Administered 2022-10-12: 75 mL via INTRAVENOUS

## 2022-10-12 MED ORDER — FREE WATER
100.0000 mL | Status: DC
  Administered 2022-10-12 – 2022-10-18 (×31): 100 mL

## 2022-10-12 MED ORDER — ONDANSETRON HCL 4 MG/2ML IJ SOLN
4.0000 mg | Freq: Every day | INTRAMUSCULAR | Status: DC
  Administered 2022-10-12 – 2022-11-10 (×30): 4 mg via INTRAVENOUS
  Filled 2022-10-12 (×30): qty 2

## 2022-10-12 NOTE — Progress Notes (Signed)
Reported by NT that pt refused to have cbg done.

## 2022-10-12 NOTE — Progress Notes (Signed)
Nutrition Follow-up  DOCUMENTATION CODES:   Severe malnutrition in context of chronic illness  INTERVENTION:   Tube Feeding: await results of CT C/A/P, recommend resuming TF if not acute abnormalities.  Recommend continuing current TF of Osmolite 1.5 at goal rate of 75 ml/hr PROSource TF20 60 ml to BID Change free water flush of 150 ml q 6 hours (600 mL) to 100 mL q 4 hours (600 mL) to provide less volume at a time but same volume for the day   Adjusted tube feeding regimen provides 2860 kcal, 153 grams of protein, and 1372 ml of H2O.  Continue saline flushes via PEG tube to provide additional sodium for hyponatremia   Total free water with current flushes: 1972 ml Encourage HOB >30 degrees while TF infusing (even at night time) to promote feeding tolerance   NUTRITION DIAGNOSIS:   Severe Malnutrition related to chronic illness (esophageal cancer) as evidenced by moderate fat depletion, severe muscle depletion, percent weight loss (25.9% weight loss in less than 1 year).  Being addressed via TF   GOAL:   Patient will meet greater than or equal to 90% of their needs  Progressing  MONITOR:   Labs, Weight trends, TF tolerance, Skin  REASON FOR ASSESSMENT:   Consult Enteral/tube feeding initiation and management  ASSESSMENT:   61 y.o. male admits related to SOB and generalized weakness. PMH includes esophageal cancer. Pt is currently receiving medical management for fevers and failure to thrive.  Noted transfer to [facility] still pending  Pt has been experiencing N/V at night over the past 48 hours. Coughing up lots of sputum. CT C/A/P today.  TF currently on hold for CT. Previously tolerating TF at goal rate of 75 ml/hr  Noted scheduled zofran ordered at bed. Recommend HOB >30 degrees, even while sleeping at night, to promote TF tolerance and reduce risk of N/V, aspiration  Weight has fluctuated but overall relatively stable around 78-80 kg; current wt 78.2 kg  Minimal  output from chest tubes/drains  Labs: prealbumin 22, sodium 135 Meds: zofran, nutrisource fiber  Diet Order:   Diet Order             Diet NPO time specified  Diet effective now                   EDUCATION NEEDS:   No education needs have been identified at this time  Skin:  Skin Assessment: Skin Integrity Issues: Skin Integrity Issues:: Incisions Incisions: right chest x 3  Last BM:  12/26 type 6  Height:   Ht Readings from Last 1 Encounters:  09/21/2022 '5\' 10"'$  (1.778 m)    Weight:   Wt Readings from Last 1 Encounters:  10/10/22 78.2 kg    BMI:  Body mass index is 24.74 kg/m.  Estimated Nutritional Needs:   Kcal:  2400-2700 kcals  Protein:  125-150 g  Fluid:  >/= 2 L   Kerman Passey MS, RDN, LDN, CNSC Registered Dietitian 3 Clinical Nutrition RD Pager and On-Call Pager Number Located in [place]

## 2022-10-12 NOTE — Progress Notes (Signed)
Transported to  CT scan by bed awake and alert.

## 2022-10-12 NOTE — Progress Notes (Signed)
OT Cancellation Note  Patient Details Name: [name] [name] MRN: [id number] DOB: [birth year]   Cancelled Treatment:    Reason Eval/Treat Not Completed: Patient declined, no reason specified (Patient declined participating with OT stating he was too tired) Lodema Hong, [place]  Office [place] 10/12/2022, 1:52 PM

## 2022-10-12 NOTE — Progress Notes (Signed)
Held tube feeding prior to CT scan.

## 2022-10-12 NOTE — Progress Notes (Signed)
Dressing to right upper old incision site changed, less drainage,  dressing to chest tube site and JP changed , no drainage noted.

## 2022-10-12 NOTE — Progress Notes (Addendum)
      [address]       [place] [address]             [phone]      14 Days Post-Op Procedure(s) (LRB): ESOPHAGOGASTRODUODENOSCOPY (EGD) WITH PROPOFOL (N/A) ESOPHAGEAL STENT PLACEMENT (N/A) STENT REMOVAL  Subjective:  Patient has been experiencing N/V overnight the past 2 nights.  Also coughing up a lot of sputum.  He is otherwise bored  Objective: Vital signs in last 24 hours: Temp:  [98 F (36.7 C)-98.8 F (37.1 C)] 98.8 F (37.1 C) (12/27 0424) Pulse Rate:  [92-106] 106 (12/27 0424) Cardiac Rhythm: Normal sinus rhythm (12/26 1905) Resp:  [17-24] 17 (12/27 0424) BP: (96-124)/(64-75) 107/74 (12/27 0424) SpO2:  [95 %-98 %] 98 % (12/27 0424)  Intake/Output from previous day: 12/26 0701 - 12/27 0700 In: 1351.1 [P.O.:240; NG/GT:1050; IV Piggyback:61.1] Out: 1685 [Urine:1675; Drains:10]  General appearance: alert, cooperative, and mild distress Heart: regular rate and rhythm and Tachyy Lungs: diminished breath sounds right base Abdomen: soft, non-tender; bowel sounds normal; no masses,  no organomegaly Extremities: extremities normal, atraumatic, no cyanosis or edema Wound: clean and dry  Lab Results: Recent Labs    10/10/22 0514  WBC 7.4  HGB 8.0*  HCT 25.7*  PLT 253   BMET:  Recent Labs    10/10/22 0514  NA 135  K 4.2  CL 101  CO2 29  GLUCOSE 148*  BUN 17  CREATININE 0.60*  CALCIUM 9.4    PT/INR: No results for input(s): "LABPROT", "INR" in the last 72 hours. ABG No results found for: "PHART", "HCO3", "TCO2", "ACIDBASEDEF", "O2SAT" CBG (last 3)  Recent Labs    10/11/22 2000 10/11/22 2342 10/12/22 0426  GLUCAP 144* 160* 149*    Assessment/Plan: S/P Procedure(s) (LRB): ESOPHAGOGASTRODUODENOSCOPY (EGD) WITH PROPOFOL (N/A) ESOPHAGEAL STENT PLACEMENT (N/A) STENT REMOVAL  CV- Sinus Tach Pulm- on RA, CT and JP drain in place with dark output-- will remain in place.. purulent cough with dark sputum not unexpected GI- NPO,  tube feedings are at 75 ml/hr.. patient with N/V overnight past 48 hours.. will scheduled zofran at bedtime to see if this helps, will also have nutrition review current rate.. nutrition labs ordered ID- afebrile, no leukocytosis- remains on Zosyn, Diflucan.. 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 with E. Coli from OR cultures Oepn Right Lateral chest incision- continue wet to dry packing Dispo- patient remains stable, some new N/V vomiting overnight will add scheduled nightly reglan, checking nutritional labs this morning. Continue ABX.Marland Kitchen awaiting transfer to [facility] for complex endoscopic closure of anastomotic leak   LOS: 26 days    Ellwood Handler, PA-C 10/12/2022  Agree with above Tachycardia today CT chest/abd/pelvis  Yarielis Funaro O Sarika Baldini

## 2022-10-13 MED ORDER — PROSOURCE TF20 ENFIT COMPATIBL EN LIQD
60.0000 mL | Freq: Every evening | ENTERAL | Status: DC
  Administered 2022-10-13 – 2022-10-16 (×4): 60 mL
  Filled 2022-10-13 (×4): qty 60

## 2022-10-13 MED ORDER — PROSOURCE TF20 ENFIT COMPATIBL EN LIQD
60.0000 mL | Freq: Every day | ENTERAL | Status: DC
  Administered 2022-10-13 – 2022-10-16 (×4): 60 mL
  Filled 2022-10-13 (×6): qty 60

## 2022-10-13 NOTE — TOC CM/SW Note (Addendum)
In progression per PA , they are working on getting an outpatient appointment at [facility] for procedure. Patient would stay here at [facility] until day before appointment, discharge home with home health and tube feedings etc .   Discussed with patient. Patient aware. Patient not sure if his supplies at home ( ABX) have expired or not.   NCM has left message with Pam with [facility], await call back.   Amy with [facility] updated.   [facility] [facility] to [place] , patient will need to go home on Dilfucan and Zosyn IV. Pam with [facility] updated . Amy with [facility] updated

## 2022-10-13 NOTE — Progress Notes (Signed)
PT Cancellation Note  Patient Details Name: [name] [name] MRN: [id number] DOB: 05/01/1961   Cancelled Treatment:    Reason Eval/Treat Not Completed: Patient reports he does not want to get up this morning, he reports there's no specific reason, and states, "Which is a good enough reason for me." Pt requesting PT return at 2-3pm. Reeducated on activity recommendations and importance of mobility.  [name], PT, DPT Acute Rehabilitation Services  Personal: [facility] Office: [place] 10/13/2022, 11:25 AM

## 2022-10-13 NOTE — Progress Notes (Addendum)
      [address]       [place] [address]             [phone]      15 Days Post-Op Procedure(s) (LRB): ESOPHAGOGASTRODUODENOSCOPY (EGD) WITH PROPOFOL (N/A) ESOPHAGEAL STENT PLACEMENT (N/A) STENT REMOVAL  Subjective:  Patient without new complaints.  Again had vomiting last night.  He stated they gave him some medicines around 1030 and his was vomiting by 1130.  Continues to have productive cough.  Objective: Vital signs in last 24 hours: Temp:  [98 F (36.7 C)-99.1 F (37.3 C)] 99.1 F (37.3 C) (12/28 0341) Pulse Rate:  [97-117] 97 (12/27 2355) Cardiac Rhythm: Normal sinus rhythm (12/28 0700) Resp:  [17-26] 20 (12/28 0341) BP: (105-115)/(62-73) 105/66 (12/28 0341) SpO2:  [95 %-98 %] 95 % (12/28 0341)  Intake/Output from previous day: 12/27 0701 - 12/28 0700 In: 1210 [NG/GT:750; IV Piggyback:400] Out: 1425 [Urine:1375; Chest Tube:50]  General appearance: alert, cooperative, and no distress Heart: regular rate and rhythm Lungs: diminished breath sounds right base Abdomen: soft, non-tender; bowel sounds normal; no masses,  no organomegaly Extremities: extremities normal, atraumatic, no cyanosis or edema Wound: clean and dry  Lab Results: No results for input(s): "WBC", "HGB", "HCT", "PLT" in the last 72 hours. BMET: No results for input(s): "NA", "K", "CL", "CO2", "GLUCOSE", "BUN", "CREATININE", "CALCIUM" in the last 72 hours.  PT/INR: No results for input(s): "LABPROT", "INR" in the last 72 hours. ABG No results found for: "PHART", "HCO3", "TCO2", "ACIDBASEDEF", "O2SAT" CBG (last 3)  Recent Labs    10/11/22 2342 10/12/22 0426 10/12/22 0743  GLUCAP 160* 149* 175*    Assessment/Plan: S/P Procedure(s) (LRB): ESOPHAGOGASTRODUODENOSCOPY (EGD) WITH PROPOFOL (N/A) ESOPHAGEAL STENT PLACEMENT (N/A) STENT REMOVAL  CV- ST, stable Pulm- on RA.. CT and JP drain remain in place.. CT scan shows improvement in previous right chest empyema... CT  continues to drain dark purulent fluid... leave all drains in place GI- continue NPO, tube feeds as ordered,  I will adjust timing of fiber and prosource to see if we can improve vomiting overnight, scheduled zofran did not help ID- E. Coli from OR cultures, remains on Zosyn, Diflucan Right chest wall opening is clean and dry, no purulence noted Malnutrition- continues to improve.. T protein up to 6.2, albumin up to 2.1 Dispo- patient still awaiting bed at [facility], they do not have a waiting list so this is day by day attempt.. per Dr. Kipp Brood may attempt to get patient outpatient appointment for procedure... continue current care   LOS: 27 days    Ellwood Handler, PA-C 10/13/2022  Agree with above Awaiting planning with [facility].  Tsuruko Murtha Bary Leriche

## 2022-10-13 NOTE — Progress Notes (Addendum)
Physical Therapy Treatment & Discharge Patient Details Name: [name] [name] MRN: [id number] DOB: 1961-10-05 Today's Date: 10/13/2022   History of Present Illness Pt is a 61 y.o. 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 Workup for anastomotic leak, to OR for stent repositioning and pleural space drainage 12/4. Pt with persistent purulent drainage, concern for persistent leak; barium swallow 12/11 showed R>L leak within upper mediastinum. Awaiting transfer to [facility] for endoscopic closure of leak. Other PMH includes radiation therapy (04/2022-06/2022).   PT Comments    Pt progressing with mobility. Pt only agreeable to ambulate 1x/day, which he is doing with rollator at supervision-level, demonstrates good awareness of activity pacing and need for seated rest breaks when tachycardic. Pt declines any mobility beyond walking while admitted, including therex or other strengthening activities. Increased time discussing this, including activity recommendations - pt still reports only interested in walking 1x/day. Therefore, will sign off acute PT services, pt can continue to ambulate with mobility specialist. Pt not interested in LE therex program or DME for home (though consistently using rollator while admitted for energy conservation and stability).   Resting HR 95, up to 147 with ambulation    Recommendations for follow up therapy are one component of a multi-disciplinary discharge planning process, led by the attending physician.  Recommendations may be updated based on patient status, additional functional criteria and insurance authorization.  Follow Up Recommendations  Home health PT     Assistance Recommended at Discharge Intermittent Supervision/Assistance  Patient can return home with the following A little help with  bathing/dressing/bathroom;Assistance with cooking/housework;Assist for transportation;Help with stairs or ramp for entrance;A little help with walking and/or transfers   Equipment Recommendations   (pt declines rollator)    Recommendations for Other Services       Precautions / Restrictions Precautions Precautions: Fall;Other (comment) Precaution Comments: R-side chest tube, JP drain, peg, watch HR Restrictions Weight Bearing Restrictions: No     Mobility  Bed Mobility Overal bed mobility: Modified Independent Bed Mobility: Supine to Sit, Sit to Supine                Transfers Overall transfer level: Modified independent Equipment used: Rollator (4 wheels) Transfers: Sit to/from Stand             General transfer comment: mod indep standing from EOB, rollator seat and low toilet height    Ambulation/Gait Ambulation/Gait assistance: Supervision Gait Distance (Feet): 220 Feet Assistive device: Rollator (4 wheels) Gait Pattern/deviations: Step-through pattern, Decreased stride length, Trunk flexed Gait velocity: Decrease     General Gait Details: slow, fatigued but steady gait with rollator; supervision for safety lines; pt with good awareness of activity pacing and need for seated rest break correlating with tachycardia; DOE 2-3/4. 2x seated rest break   Stairs             Wheelchair Mobility    Modified Rankin (Stroke Patients Only)       Balance Overall balance assessment: Needs assistance Sitting-balance support: No upper extremity supported, Feet supported Sitting balance-Leahy Scale: Good     Standing balance support: During functional activity, No upper extremity supported, Bilateral upper extremity supported Standing balance-Leahy Scale: Fair                              Cognition Arousal/Alertness: Awake/alert  Behavior During Therapy: Flat affect Overall Cognitive Status: Within Functional Limits for tasks assessed                                           Exercises      General Comments General comments (skin integrity, edema, etc.): increased time discussing importance of activity progression, pt reports only interested in walking 1x/day, does not care to do therex or other strengthening activities. discussed that pt can continue walking with mobility specialist, acute PT will sign off since pt does not desire to participate in any other activity/mobility, pt in agreement. pt declines need for DME though consistently using rollator while admitted. pt reports he walked 6-7 mi/day before admission, he "knows what to do"      Pertinent Vitals/Pain Pain Assessment Pain Assessment: Faces Faces Pain Scale: Hurts little more Pain Location: PEG tube insertion "pulling" Pain Descriptors / Indicators: Discomfort Pain Intervention(s): Monitored during session, Other (comment) (RN notified to check site)    Home Living                          Prior Function            PT Goals (current goals can now be found in the care plan section) Progress towards PT goals: Goals met/education completed, patient discharged from PT    Frequency    Min 3X/week      PT Plan Current plan remains appropriate    Co-evaluation              AM-PAC PT "6 Clicks" Mobility   Outcome Measure  Help needed turning from your back to your side while in a flat bed without using bedrails?: None Help needed moving from lying on your back to sitting on the side of a flat bed without using bedrails?: None Help needed moving to and from a bed to a chair (including a wheelchair)?: None Help needed standing up from a chair using your arms (e.g., wheelchair or bedside chair)?: None Help needed to walk in hospital room?: A Little Help needed climbing 3-5 steps with a railing? : A Little 6 Click Score: 22    End of Session   Activity Tolerance: Patient tolerated treatment well Patient left: in  bed;with call bell/phone within reach;with family/visitor present Nurse Communication: Mobility status PT Visit Diagnosis: Other abnormalities of gait and mobility (R26.89);Muscle weakness (generalized) (M62.81);Unsteadiness on feet (R26.81)     Time: 8295-6213 PT Time Calculation (min) (ACUTE ONLY): 21 min  Charges:  $Therapeutic Exercise: 8-22 mins                     [name], PT, DPT Acute Rehabilitation Services  Personal: [facility] Office: [place] 10/13/2022, 3:09 PM

## 2022-10-14 ENCOUNTER — Encounter (HOSPITAL_COMMUNITY): Payer: Self-pay | Admitting: Thoracic Surgery (Cardiothoracic Vascular Surgery)

## 2022-10-14 LAB — CREATININE, SERUM
Creatinine, Ser: 0.59 mg/dL — ABNORMAL LOW (ref 0.61–1.24)
GFR, Estimated: >60 mL/min (ref >60)

## 2022-10-14 MED ORDER — PIPERACILLIN-TAZOBACTAM IV (FOR PTA / DISCHARGE USE ONLY): 13.5000 g | INTRAVENOUS | 0 refills | Status: DC

## 2022-10-14 NOTE — Progress Notes (Signed)
Patient seemed depressed overnight stating he didn't want to get OOB for a daily weight check, didn't want his dressing changed on his right chest and he didn't want his port dressing changed this morning. Will pass this info on to days to let MD know of patients decisions.

## 2022-10-14 NOTE — Progress Notes (Signed)
PHARMACY CONSULT NOTE FOR:  OUTPATIENT  PARENTERAL ANTIBIOTIC THERAPY (OPAT)  Indication: Esophageal leak/empyema Regimen: Zosyn 13.5g daily as a continuous infusion End date: 11/25/22  IV antibiotic discharge orders are pended. To discharging provider:  please sign these orders via discharge navigator,  Select New Orders & click on the button choice - Manage This Unsigned Work.    Thank you for allowing pharmacy to be a part of this patient's care.  Alycia Rossetti, PharmD, BCPS Infectious Diseases Clinical Pharmacist 10/14/2022 12:29 PM   **Pharmacist phone directory can now be found on [email or web address] ([email or web address]).  Listed under [place].

## 2022-10-14 NOTE — Progress Notes (Signed)
[facility] for Infectious Disease  Date of Admission:  09/17/2022      Total days of antibiotics 28   Zosyn  Fluconazole          ASSESSMENT: [name] [name] is a 61 y.o. male with persistent anastomotoc leak and esophageal fistulas present. Previously on Augmentin via J-tube however worsened and required re-admission on 12/01 for evaluation of new fevers and purulent drainage from chest tubes. Esophagogastroscopy 12/04 with repositioning of eso stent and drainage of pleural space for empyema.  Cultures grew out E coli (R-amp/sulb, TMP/S). He has been on Zosyn + Fluconazole throughout hospital stay for management while TCTS plans to have him seen by [facility] team for surgical correction. He remains afebrile, hemodynamically stable and wbc normalized. No myelosuppression on cell lines with prolonged zosyn exposure. If needed could do ceftriaxone + metronidazole BID VT + fluconazole VT based on micro data; no psa isolated. However given complex medical issues here and stability while awaiting transfer, would advocate to continue with zosyn. He will need continuous infusion set up for administration in the home via his port-a-cath.  QTc 400 ms   OPAT ORDERS:  Diagnosis: Empyema d/t anastomotic esophageal leak    Culture Result: E coli (R-TMP/S, Amp/Sulb)  No Known Allergies   Discharge antibiotics to be given via PICC line:   Continuous zosyn infusion 13.5 g daily    +  Fluconazole 400 mg suspension VT   Duration: 6 weeks    End Date: Feb 9th 2024 (pending [facility] appt)  Port-A Cath Care Per Protocol with [employer] Use: Home health RN for IV administration and teaching, line care and labs.    Labs weekly while on IV antibiotics: _x_ CBC with differential __ BMP **TWICE WEEKLY ON VANCOMYCIN  _x_ CMP __ CRP __ ESR __ Vancomycin trough TWICE WEEKLY __ CK   Fax weekly labs to [phone]  FU With ID 2/1 @ 4:00 pm with Dr. Gale Journey      Principal  Problem:   Fever Active Problems:   Esophageal dysphagia   GE junction carcinoma (HCC)   Port-A-Cath in place   Esophageal cancer ([place])   History of esophagectomy   Malnutrition of moderate degree   Empyema (HCC)   Esophageal anastomotic leak   History of esophageal cancer   Fibrous mediastinitis   Esophageal fistula   Protein-calorie malnutrition, severe    Chlorhexidine Gluconate Cloth  6 each Topical Daily   enoxaparin (LOVENOX) injection  40 mg Subcutaneous Q24H   feeding supplement (PROSource TF20)  60 mL Per Tube Q breakfast   feeding supplement (PROSource TF20)  60 mL Per Tube QPM   fiber  1 packet Per Tube BID   free water  100 mL Per Tube Q4H   ondansetron (ZOFRAN) IV  4 mg Intravenous QHS   mouth rinse  15 mL Mouth Rinse 4 times per day   pantoprazole (PROTONIX) IV  40 mg Intravenous Q24H   sodium chloride flush  30 mL Per Tube Q8H     [name] Madeira, MSN, NP-C [email or web address] Pager: [phone] Office: [phone] [facility] Main Line: [place] Communication Welcome

## 2022-10-14 NOTE — Progress Notes (Addendum)
      [address]       [place] [address]             [phone]      16 Days Post-Op Procedure(s) (LRB): ESOPHAGOGASTRODUODENOSCOPY (EGD) WITH PROPOFOL (N/A) ESOPHAGEAL STENT PLACEMENT (N/A) STENT REMOVAL  Subjective:  Patient without new complaints.  States they attempted to change his port a cath dressing at 230 in the morning.  Objective: Vital signs in last 24 hours: Temp:  [98.3 F (36.8 C)-99.1 F (37.3 C)] 98.6 F (37 C) (12/29 0340) Pulse Rate:  [95] 95 (12/28 1152) Cardiac Rhythm: Normal sinus rhythm (12/29 0700) Resp:  [25-27] 25 (12/29 0340) BP: (104-106)/(64-72) 104/68 (12/29 0340) SpO2:  [96 %-98 %] 96 % (12/28 1550) Weight:  [79.8 kg] 79.8 kg (12/29 0530)  Intake/Output from previous day: 12/28 0701 - 12/29 0700 In: 9798 [P.O.:120; NG/GT:950; IV Piggyback:400] Out: 930 [Urine:860; Drains:30; Chest Tube:40]  General appearance: alert, cooperative, and no distress Heart: regular rate and rhythm Lungs: clear to auscultation bilaterally Abdomen: soft, non-tender; bowel sounds normal; no masses,  no organomegaly Extremities: extremities normal, atraumatic, no cyanosis or edema Wound: clean and dry  Lab Results: No results for input(s): "WBC", "HGB", "HCT", "PLT" in the last 72 hours. BMET:  Recent Labs    10/14/22 0530  CREATININE 0.59*    PT/INR: No results for input(s): "LABPROT", "INR" in the last 72 hours. ABG No results found for: "PHART", "HCO3", "TCO2", "ACIDBASEDEF", "O2SAT" CBG (last 3)  Recent Labs    10/11/22 2342 10/12/22 0426 10/12/22 0743  GLUCAP 160* 149* 175*    Assessment/Plan: S/P Procedure(s) (LRB): ESOPHAGOGASTRODUODENOSCOPY (EGD) WITH PROPOFOL (N/A) ESOPHAGEAL STENT PLACEMENT (N/A) STENT REMOVAL  CV- ST, stable Pulm- on RA, CT and Jp drain will remain in place... no acute issues GI- NPO, continue tube feedings, adjusting schedule of Pro Source helped and patient had no N/V overnight ID- E. Coli  from OR cultures on Zosyn, Diflucan.. with persistent anastomotic leak and esophageal fistulas present patient will require IV ABX until leak resolves and can be repair.Marland Kitchen per hospital policy will consult ID Malnutrition- improving Dispo- patient stable, working on setting up care at [facility].. likely on outpatient basis...patient will continue current care and remain in hospital until care at [facility] can be arranged   LOS: 28 days    Ellwood Handler, PA-C 10/14/2022  Agree with above Awaiting planning with [address]

## 2022-10-14 NOTE — Progress Notes (Signed)
Occupational Therapy Treatment Patient Details Name: [name] [name] MRN: [id number] DOB: October 12, 1961 Today's Date: 10/14/2022   History of present illness Pt is a 61 y.o. 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 Workup for anastomotic leak, to OR for stent repositioning and pleural space drainage 12/4. Pt with persistent purulent drainage, concern for persistent leak; barium swallow 12/11 showed R>L leak within upper mediastinum. Awaiting transfer to [facility] for endoscopic closure of leak. Other PMH includes radiation therapy (04/2022-06/2022).   OT comments  Patient demonstrated gain with UB dressing requiring supervision while seated. Patient interested only in walking today. Patient was mod I for bed mobility and transfers with use of rollator. Patient required seated rest break in hallway due to increased HR of 124 and continued to perform mobility after rest and returned room with HR at 137. Patient was provided with energy conservation strategies handout and reviewed with patient and wife. Acute OT to continue to follow with discharge recommendations continue to be appropriate.    Recommendations for follow up therapy are one component of a multi-disciplinary discharge planning process, led by the attending physician.  Recommendations may be updated based on patient status, additional functional criteria and insurance authorization.    Follow Up Recommendations  Home health OT     Assistance Recommended at Discharge Frequent or constant Supervision/Assistance  Patient can return home with the following  A little help with walking and/or transfers;A little help with bathing/dressing/bathroom;Assistance with cooking/housework;Assist for transportation;Help with stairs or ramp for entrance   Equipment Recommendations  Other  (comment) (TBD)    Recommendations for Other Services      Precautions / Restrictions Precautions Precautions: Fall;Other (comment) Precaution Comments: R-side chest tube, JP drain, peg, watch HR Restrictions Weight Bearing Restrictions: No       Mobility Bed Mobility Overal bed mobility: Modified Independent Bed Mobility: Supine to Sit, Sit to Supine           General bed mobility comments: no physical assistance needed    Transfers Overall transfer level: Modified independent Equipment used: Rollator (4 wheels) Transfers: Sit to/from Stand             General transfer comment: stood from EOB and rollator without assistance and verbal cues for chest tube     Balance Overall balance assessment: Needs assistance Sitting-balance support: No upper extremity supported, Feet supported Sitting balance-Leahy Scale: Good     Standing balance support: During functional activity, No upper extremity supported, Bilateral upper extremity supported Standing balance-Leahy Scale: Fair Standing balance comment: can static stand without UE support                           ADL either performed or assessed with clinical judgement   ADL Overall ADL's : Needs assistance/impaired                 Upper Body Dressing : Supervision/safety;Sitting Upper Body Dressing Details (indicate cue type and reason): donned gown to cover back                   General ADL Comments: patient agreeable to walking only    Extremity/Trunk Assessment              Vision       Perception     Praxis  Cognition Arousal/Alertness: Awake/alert Behavior During Therapy: Flat affect Overall Cognitive Status: Within Functional Limits for tasks assessed                                          Exercises      Shoulder Instructions       General Comments only interested in walker. provided patient with energy conservation strategies .   Patient required seated rest break due to increased HR of 124 and 137 once he returned to room.    Pertinent Vitals/ Pain       Pain Assessment Pain Assessment: Faces Faces Pain Scale: Hurts little more Pain Location: PEG tube insertion "pulling" Pain Descriptors / Indicators: Discomfort Pain Intervention(s): Monitored during session, Repositioned  Home Living                                          Prior Functioning/Environment              Frequency  Min 2X/week        Progress Toward Goals  OT Goals(current goals can now be found in the care plan section)  Progress towards OT goals: Progressing toward goals  Acute Rehab OT Goals Patient Stated Goal: get better OT Goal Formulation: With patient Time For Goal Achievement: 10/21/22 Potential to Achieve Goals: Good ADL Goals Pt Will Perform Lower Body Bathing: sit to/from stand;with modified independence Pt Will Perform Lower Body Dressing: with modified independence;sit to/from stand Pt Will Transfer to Toilet: with modified independence;ambulating;bedside commode Pt/caregiver will Perform Home Exercise Program: Both right and left upper extremity;With theraband;With written HEP provided;Independently Additional ADL Goal #1: Pt will verbalize 3 strateiges for Energy conservation Additional ADL Goal #2: Pt will stand without UB support during functional activity with supervision in preparation for standing ADLs.  Plan Discharge plan remains appropriate    Co-evaluation                 AM-PAC OT "6 Clicks" Daily Activity     Outcome Measure   Help from another person eating meals?: Total (NPO) Help from another person taking care of personal grooming?: A Little Help from another person toileting, which includes using toliet, bedpan, or urinal?: A Little Help from another person bathing (including washing, rinsing, drying)?: A Little Help from another person to put on and taking off  regular upper body clothing?: A Little Help from another person to put on and taking off regular lower body clothing?: A Little 6 Click Score: 16    End of Session Equipment Utilized During Treatment: Rollator (4 wheels)  OT Visit Diagnosis: Unsteadiness on feet (R26.81);Other abnormalities of gait and mobility (R26.89);Muscle weakness (generalized) (M62.81);Pain   Activity Tolerance Patient tolerated treatment well   Patient Left in bed;with call bell/phone within reach;with family/visitor present   Nurse Communication Mobility status        Time: 7510-2585 OT Time Calculation (min): 20 min  Charges: OT General Charges $OT Visit: 1 Visit OT Treatments $Therapeutic Activity: 8-22 mins  Lodema Hong, [place]  Office [phone]   Trixie Dredge 10/14/2022, 2:29 PM

## 2022-10-15 ENCOUNTER — Other Ambulatory Visit: Payer: Self-pay

## 2022-10-15 NOTE — Progress Notes (Addendum)
      [address]       [place] [address]             [phone]      17 Days Post-Op Procedure(s) (LRB): ESOPHAGOGASTRODUODENOSCOPY (EGD) WITH PROPOFOL (N/A) ESOPHAGEAL STENT PLACEMENT (N/A) STENT REMOVAL  Subjective:  No new changes.  Had an okay day yesterday, no further N/V.  Worked with PT/OT  Objective: Vital signs in last 24 hours: Temp:  [98.3 F (36.8 C)-98.7 F (37.1 C)] 98.4 F (36.9 C) (12/30 0340) Pulse Rate:  [87-98] 95 (12/29 2347) Cardiac Rhythm: Normal sinus rhythm (12/29 1900) Resp:  [17-24] 22 (12/30 0340) BP: (101-110)/(62-72) 110/62 (12/30 0340) SpO2:  [96 %-98 %] 97 % (12/29 2347) Weight:  [80.1 kg] 80.1 kg (12/30 0506)  Intake/Output from previous day: 12/29 0701 - 12/30 0700 In: 1528.4 [NG/GT:800; IV Piggyback:728.4] Out: 1230 [Urine:1150; Drains:30; Chest Tube:50]  General appearance: alert, cooperative, and no distress Heart: regular rate and rhythm and tachycardia Lungs: diminished on right Abdomen: soft, non-tender; bowel sounds normal; no masses,  no organomegaly Extremities: extremities normal, atraumatic, no cyanosis or edema Wound: clean and dry  Lab Results: No results for input(s): "WBC", "HGB", "HCT", "PLT" in the last 72 hours. BMET:  Recent Labs    10/14/22 0530  CREATININE 0.59*    PT/INR: No results for input(s): "LABPROT", "INR" in the last 72 hours. ABG No results found for: "PHART", "HCO3", "TCO2", "ACIDBASEDEF", "O2SAT" CBG (last 3)  No results for input(s): "GLUCAP" in the last 72 hours.  Assessment/Plan: S/P Procedure(s) (LRB): ESOPHAGOGASTRODUODENOSCOPY (EGD) WITH PROPOFOL (N/A) ESOPHAGEAL STENT PLACEMENT (N/A) STENT REMOVAL  CV- ST, stable Pulm- CT (level at 1420) and JP drain to remain in place... draining as expected, purulent cough persists, not requiring oxygen Renal- creatinine remains stable GI- keep NPO, tube feedings at goal, no further vomiting with adjustment of administration  time of pro-source, malnutrition improving ID- remains on Zosyn, Diflucan-- will continue IV Zosyn at discharge, will convert diflucan to liquid... appreciate ID assistance.... these medications will continue until leak can be resolved Deconditioning- working with PT/OT.. home orders placed Dispo- patient stable, awaiting arrangements with [facility].. patient can be discharged to [facility] vs. Home once arrangements are completed   LOS: 29 days    Ellwood Handler, PA-C 10/15/2022  Agree with above Awaiting [facility] transfer  Lajuana Matte

## 2022-10-15 NOTE — Progress Notes (Signed)
Mobility Specialist - Progress Note   10/15/22 0900  Mobility  Activity Refused mobility    Pt stated he "would like to pass today." When asked if he would be willing to walk later, continued to decline. Will follow up tomorrow.   [place] Specialist Please contact via SecureChat or Rehab office at [phone]

## 2022-10-16 NOTE — Progress Notes (Addendum)
      [address]       [place] [address]             [phone]      18 Days Post-Op Procedure(s) (LRB): ESOPHAGOGASTRODUODENOSCOPY (EGD) WITH PROPOFOL (N/A) ESOPHAGEAL STENT PLACEMENT (N/A) STENT REMOVAL  Subjective:  Patient without new complaints.  Denies N/V, Declined Ambulation yesterday  Objective: Vital signs in last 24 hours: Temp:  [98.3 F (36.8 C)-98.6 F (37 C)] 98.5 F (36.9 C) (12/31 0520) Pulse Rate:  [92-112] 92 (12/30 2015) Cardiac Rhythm: Normal sinus rhythm (12/30 1900) Resp:  [20] 20 (12/30 2015) BP: (107-108)/(69-76) 108/69 (12/30 2015) SpO2:  [95 %-99 %] 98 % (12/30 2015) Weight:  [80.8 kg] 80.8 kg (12/31 0520)  Intake/Output from previous day: 12/30 0701 - 12/31 0700 In: -  Out: [place] [Urine:1750]  General appearance: alert, cooperative, and no distress Heart: regular rate and rhythm Lungs: diminished right base Abdomen: soft, non-tender; bowel sounds normal; no masses,  no organomegaly Extremities: extremities normal, atraumatic, no cyanosis or edema Wound: clean and dry  Lab Results: No results for input(s): "WBC", "HGB", "HCT", "PLT" in the last 72 hours. BMET:  Recent Labs    10/14/22 0530  CREATININE 0.59*    PT/INR: No results for input(s): "LABPROT", "INR" in the last 72 hours. ABG No results found for: "PHART", "HCO3", "TCO2", "ACIDBASEDEF", "O2SAT" CBG (last 3)  No results for input(s): "GLUCAP" in the last 72 hours.  Assessment/Plan: S/P Procedure(s) (LRB): ESOPHAGOGASTRODUODENOSCOPY (EGD) WITH PROPOFOL (N/A) ESOPHAGEAL STENT PLACEMENT (N/A) STENT REMOVAL  CV- ST, stable Pulm- CT and JP drain output is low, will remain in place until leak resolves GI- remains NPO, continue tube feeds at goal,  ID- afebrile, continue ABX IV zosyn, diflucan, ID is monitoring Deconditioning- continue PT/OT Dispo- patient stable, dispo planning is base on arrangements at [facility], continue current care   LOS: 30 days    Ellwood Handler, PA-C 10/16/2022   Agree with above Awaiting transfer  Lajuana Matte

## 2022-10-16 NOTE — Progress Notes (Signed)
Mobility Specialist - Progress Note   10/16/22 1400  Mobility  Activity Refused mobility    Pt declined, no reason specified. After encouragement, stated he can walk on his own. Will follow up as time allows.   [place] Specialist Please contact via SecureChat or Rehab office at [phone]

## 2022-10-17 MED ORDER — ORAL CARE MOUTH RINSE: 15.0000 mL | OROMUCOSAL | Status: DC | PRN

## 2022-10-17 NOTE — Plan of Care (Signed)
  Problem: Clinical Measurements: Goal: Respiratory complications will improve Outcome: Progressing Goal: Cardiovascular complication will be avoided Outcome: Progressing   Problem: Activity: Goal: Risk for activity intolerance will decrease Outcome: Progressing   Problem: Nutrition: Goal: Adequate nutrition will be maintained Outcome: Progressing   Problem: Elimination: Goal: Will not experience complications related to bowel motility Outcome: Progressing Goal: Will not experience complications related to urinary retention Outcome: Progressing   Problem: Pain Managment: Goal: General experience of comfort will improve Outcome: Not Progressing

## 2022-10-17 NOTE — Progress Notes (Addendum)
      [address]       [place] [address]             [phone]      19 Days Post-Op Procedure(s) (LRB): ESOPHAGOGASTRODUODENOSCOPY (EGD) WITH PROPOFOL (N/A) ESOPHAGEAL STENT PLACEMENT (N/A) STENT REMOVAL  Subjective:  Patient doing well today.  He states he has stopped eating ice chips as he notices they increase his cough.  + BM yesterday  Objective: Vital signs in last 24 hours: Temp:  [98 F (36.7 C)-98.7 F (37.1 C)] 98.5 F (36.9 C) (12/31 2344) Pulse Rate:  [92-101] 101 (12/31 1618) Cardiac Rhythm: Sinus tachycardia (12/31 1934) Resp:  [17-24] 22 (01/01 0400) BP: (98-118)/(65-75) 98/69 (01/01 0400) SpO2:  [97 %-98 %] 98 % (12/31 2344) Weight:  [78.4 kg] 78.4 kg (01/01 0400)  Intake/Output from previous day: 12/31 0701 - 01/01 0700 In: 1833.2 [NG/GT:1067.5; IV Piggyback:765.7] Out: 600 [Urine:600] Intake/Output this shift: Total I/O In: 269.6 [NG/GT:241.3; IV Piggyback:28.3] Out: 20 [Drains:20]  General appearance: alert, cooperative, and no distress Heart: regular rate and rhythm Lungs: diminished breath sounds right base Abdomen: soft, non-tender; bowel sounds normal; no masses,  no organomegaly Extremities: extremities normal, atraumatic, no cyanosis or edema Wound: clean and dry  Lab Results: No results for input(s): "WBC", "HGB", "HCT", "PLT" in the last 72 hours. BMET: No results for input(s): "NA", "K", "CL", "CO2", "GLUCOSE", "BUN", "CREATININE", "CALCIUM" in the last 72 hours.  PT/INR: No results for input(s): "LABPROT", "INR" in the last 72 hours. ABG No results found for: "PHART", "HCO3", "TCO2", "ACIDBASEDEF", "O2SAT" CBG (last 3)  No results for input(s): "GLUCAP" in the last 72 hours.  Assessment/Plan: S/P Procedure(s) (LRB): ESOPHAGOGASTRODUODENOSCOPY (EGD) WITH PROPOFOL (N/A) ESOPHAGEAL STENT PLACEMENT (N/A) STENT REMOVAL  CV- ST, stable Pulm- CT in place no output recorded, I irrigated tubing and removed  thickened debris, JP drain in place to gravity.. both tubes to remain in place, continues to have productive cough GI- NPO, continue tube feeds at goal rate, supplements as ordered, malnutrition is improving ID- remains afebrile, will check labs in AM, continue Zosyn, Diflucan.. until leak is repaired/resolved Deconditioning- home PT/OT ordered Dispo- patient stable, continue current care   LOS: 31 days    Ellwood Handler, PA-C 10/17/2022  Agree with above Will start cycling tube feeds [place]

## 2022-10-17 NOTE — Progress Notes (Signed)
Mobility Specialist Progress Note    10/17/22 1422  Mobility  Activity Ambulated with assistance in hallway  Level of Assistance Standby assist, set-up cues, supervision of patient - no hands on  Assistive Device Four wheel walker  Distance Ambulated (ft) 500 ft  Activity Response Tolerated well  Mobility Referral Yes  $Mobility charge 1 Mobility   Pre-Mobility: 90 HR During Mobility: 140 HR Post-Mobility: 130 HR  Pt received in bed and agreeable. No complaints on walk. Took x1 seated rest break. Returned to [facility] and encouraged to pull string when ready to get up. RN aware.    Hildred Alamin Mobility Specialist  Please Psychologist, sport and exercise or Rehab Office at [phone]

## 2022-10-17 NOTE — Plan of Care (Signed)
Problem: Education: Goal: Knowledge of General Education information will improve Description: Including pain rating scale, medication(s)/side effects and non-pharmacologic comfort measures 10/17/2022 0118 by Driscilla Grammes, Ardeen Fillers, RN Outcome: Progressing 10/17/2022 0115 by Driscilla Grammes, Ardeen Fillers, RN Outcome: Progressing   Problem: Health Behavior/Discharge Planning: Goal: Ability to manage health-related needs will improve 10/17/2022 0118 by Tillie Fantasia, RN Outcome: Progressing 10/17/2022 0115 by Tillie Fantasia, RN Outcome: Progressing   Problem: Clinical Measurements: Goal: Ability to maintain clinical measurements within normal limits will improve 10/17/2022 0118 by Tillie Fantasia, RN Outcome: Progressing 10/17/2022 0115 by Tillie Fantasia, RN Outcome: Progressing Goal: Will remain free from infection 10/17/2022 0118 by Tillie Fantasia, RN Outcome: Progressing 10/17/2022 0115 by Tillie Fantasia, RN Outcome: Progressing Goal: Diagnostic test results will improve 10/17/2022 0118 by Tillie Fantasia, RN Outcome: Progressing 10/17/2022 0115 by Tillie Fantasia, RN Outcome: Progressing Goal: Respiratory complications will improve 10/17/2022 0118 by Tillie Fantasia, RN Outcome: Progressing 10/17/2022 0115 by Tillie Fantasia, RN Outcome: Progressing Goal: Cardiovascular complication will be avoided 10/17/2022 0118 by Tillie Fantasia, RN Outcome: Progressing 10/17/2022 0115 by Tillie Fantasia, RN Outcome: Progressing   Problem: Activity: Goal: Risk for activity intolerance will decrease 10/17/2022 0118 by Driscilla Grammes, Ardeen Fillers, RN Outcome: Progressing 10/17/2022 0115 by Driscilla Grammes, Ardeen Fillers, RN Outcome: Progressing   Problem: Nutrition: Goal: Adequate nutrition will be maintained 10/17/2022 0118 by Tillie Fantasia, RN Outcome:  Progressing 10/17/2022 0115 by Driscilla Grammes, Ardeen Fillers, RN Outcome: Progressing   Problem: Coping: Goal: Level of anxiety will decrease 10/17/2022 0118 by Tillie Fantasia, RN Outcome: Progressing 10/17/2022 0115 by Tillie Fantasia, RN Outcome: Progressing   Problem: Elimination: Goal: Will not experience complications related to bowel motility 10/17/2022 0118 by Tillie Fantasia, RN Outcome: Progressing 10/17/2022 0115 by Tillie Fantasia, RN Outcome: Progressing Goal: Will not experience complications related to urinary retention 10/17/2022 0118 by Tillie Fantasia, RN Outcome: Progressing 10/17/2022 0115 by Tillie Fantasia, RN Outcome: Progressing   Problem: Pain Managment: Goal: General experience of comfort will improve 10/17/2022 0118 by Tillie Fantasia, RN Outcome: Progressing 10/17/2022 0115 by Driscilla Grammes, Ardeen Fillers, RN Outcome: Progressing   Problem: Safety: Goal: Ability to remain free from injury will improve 10/17/2022 0118 by Tillie Fantasia, RN Outcome: Progressing 10/17/2022 0115 by Tillie Fantasia, RN Outcome: Progressing   Problem: Skin Integrity: Goal: Risk for impaired skin integrity will decrease 10/17/2022 0118 by Tillie Fantasia, RN Outcome: Progressing 10/17/2022 0115 by Tillie Fantasia, RN Outcome: Progressing   Problem: Education: Goal: Knowledge of disease or condition will improve 10/17/2022 0118 by Tillie Fantasia, RN Outcome: Progressing 10/17/2022 0115 by Tillie Fantasia, RN Outcome: Progressing Goal: Knowledge of the prescribed therapeutic regimen will improve 10/17/2022 0118 by Tillie Fantasia, RN Outcome: Progressing 10/17/2022 0115 by Tillie Fantasia, RN Outcome: Progressing   Problem: Activity: Goal: Risk for activity intolerance will decrease 10/17/2022 0118 by Driscilla Grammes, Ardeen Fillers,  RN Outcome: Progressing 10/17/2022 0115 by Driscilla Grammes, Ardeen Fillers, RN Outcome: Progressing   Problem: Cardiac: Goal: Will achieve and/or maintain hemodynamic stability 10/17/2022 0118 by Tillie Fantasia, RN Outcome: Progressing 10/17/2022 0115 by Tillie Fantasia, RN Outcome: Progressing   Problem: Clinical Measurements: Goal: Postoperative complications will be  avoided or minimized 10/17/2022 0118 by Driscilla Grammes, Ardeen Fillers, RN Outcome: Progressing 10/17/2022 0115 by Driscilla Grammes, Ardeen Fillers, RN Outcome: Progressing   Problem: Respiratory: Goal: Respiratory status will improve 10/17/2022 0118 by Tillie Fantasia, RN Outcome: Progressing 10/17/2022 0115 by Driscilla Grammes, Ardeen Fillers, RN Outcome: Progressing   Problem: Pain Management: Goal: Pain level will decrease 10/17/2022 0118 by Tillie Fantasia, RN Outcome: Progressing 10/17/2022 0115 by Tillie Fantasia, RN Outcome: Progressing   Problem: Skin Integrity: Goal: Wound healing without signs and symptoms infection will improve 10/17/2022 0118 by Tillie Fantasia, RN Outcome: Progressing 10/17/2022 0115 by Tillie Fantasia, RN Outcome: Progressing   Problem: Education: Goal: Knowledge of the prescribed therapeutic regimen will improve 10/17/2022 0118 by Tillie Fantasia, RN Outcome: Progressing 10/17/2022 0115 by Tillie Fantasia, RN Outcome: Progressing   Problem: Bowel/Gastric: Goal: Gastrointestinal status for postoperative course will improve 10/17/2022 0118 by Driscilla Grammes, Ardeen Fillers, RN Outcome: Progressing 10/17/2022 0115 by Driscilla Grammes, Ardeen Fillers, RN Outcome: Progressing   Problem: Nutritional: Goal: Ability to achieve adequate nutritional intake will improve 10/17/2022 0118 by Tillie Fantasia, RN Outcome: Progressing 10/17/2022 0115 by Tillie Fantasia, RN Outcome: Progressing   Problem: Clinical  Measurements: Goal: Postoperative complications will be avoided or minimized 10/17/2022 0118 by Tillie Fantasia, RN Outcome: Progressing 10/17/2022 0115 by Tillie Fantasia, RN Outcome: Progressing   Problem: Respiratory: Goal: Ability to maintain a clear airway will improve 10/17/2022 0118 by Tillie Fantasia, RN Outcome: Progressing 10/17/2022 0115 by Tillie Fantasia, RN Outcome: Progressing   Problem: Skin Integrity: Goal: Demonstration of wound healing without infection will improve 10/17/2022 0118 by Tillie Fantasia, RN Outcome: Progressing 10/17/2022 0115 by Tillie Fantasia, RN Outcome: Progressing

## 2022-10-17 NOTE — Plan of Care (Signed)
  Problem: Education: Goal: Knowledge of General Education information will improve Description: Including pain rating scale, medication(s)/side effects and non-pharmacologic comfort measures Outcome: Progressing   Problem: Health Behavior/Discharge Planning: Goal: Ability to manage health-related needs will improve Outcome: Progressing   Problem: Clinical Measurements: Goal: Ability to maintain clinical measurements within normal limits will improve Outcome: Progressing Goal: Will remain free from infection Outcome: Progressing Goal: Diagnostic test results will improve Outcome: Progressing Goal: Respiratory complications will improve Outcome: Progressing Goal: Cardiovascular complication will be avoided Outcome: Progressing   Problem: Activity: Goal: Risk for activity intolerance will decrease Outcome: Progressing   Problem: Nutrition: Goal: Adequate nutrition will be maintained Outcome: Progressing   Problem: Coping: Goal: Level of anxiety will decrease Outcome: Progressing   Problem: Elimination: Goal: Will not experience complications related to bowel motility Outcome: Progressing Goal: Will not experience complications related to urinary retention Outcome: Progressing   Problem: Pain Managment: Goal: General experience of comfort will improve Outcome: Progressing   Problem: Safety: Goal: Ability to remain free from injury will improve Outcome: Progressing   Problem: Skin Integrity: Goal: Risk for impaired skin integrity will decrease Outcome: Progressing   Problem: Education: Goal: Knowledge of disease or condition will improve Outcome: Progressing Goal: Knowledge of the prescribed therapeutic regimen will improve Outcome: Progressing   Problem: Activity: Goal: Risk for activity intolerance will decrease Outcome: Progressing   Problem: Cardiac: Goal: Will achieve and/or maintain hemodynamic stability Outcome: Progressing   Problem: Clinical  Measurements: Goal: Postoperative complications will be avoided or minimized Outcome: Progressing   Problem: Respiratory: Goal: Respiratory status will improve Outcome: Progressing   Problem: Pain Management: Goal: Pain level will decrease Outcome: Progressing   Problem: Skin Integrity: Goal: Wound healing without signs and symptoms infection will improve Outcome: Progressing   Problem: Education: Goal: Knowledge of the prescribed therapeutic regimen will improve Outcome: Progressing   Problem: Bowel/Gastric: Goal: Gastrointestinal status for postoperative course will improve Outcome: Progressing   Problem: Nutritional: Goal: Ability to achieve adequate nutritional intake will improve Outcome: Progressing   Problem: Clinical Measurements: Goal: Postoperative complications will be avoided or minimized Outcome: Progressing   Problem: Respiratory: Goal: Ability to maintain a clear airway will improve Outcome: Progressing   Problem: Skin Integrity: Goal: Demonstration of wound healing without infection will improve Outcome: Progressing

## 2022-10-17 DEATH — deceased

## 2022-10-18 ENCOUNTER — Telehealth: Payer: Self-pay | Admitting: Gastroenterology

## 2022-10-18 LAB — CBC
HCT: 25.1 % — ABNORMAL LOW (ref 39.0–52.0)
Hemoglobin: 8 g/dL — ABNORMAL LOW (ref 13.0–17.0)
MCH: 31.3 pg (ref 26.0–34.0)
MCHC: 31.9 g/dL (ref 30.0–36.0)
MCV: 98 fL (ref 80.0–100.0)
Platelets: 244 10*3/uL (ref 150–400)
RBC: 2.56 MIL/uL — ABNORMAL LOW (ref 4.22–5.81)
RDW: 18 % — ABNORMAL HIGH (ref 11.5–15.5)
WBC: 5.2 10*3/uL (ref 4.0–10.5)
nRBC: 0 % (ref 0.0–0.2)

## 2022-10-18 LAB — BASIC METABOLIC PANEL
Anion gap: 9 (ref 5–15)
BUN: 17 mg/dL (ref 8–23)
CO2: 28 mmol/L (ref 22–32)
Calcium: 9.3 mg/dL (ref 8.9–10.3)
Chloride: 95 mmol/L — ABNORMAL LOW (ref 98–111)
Creatinine, Ser: 0.51 mg/dL — ABNORMAL LOW (ref 0.61–1.24)
GFR, Estimated: >60 mL/min (ref >60)
Glucose, Bld: 163 mg/dL — ABNORMAL HIGH (ref 70–99)
Potassium: 4.3 mmol/L (ref 3.5–5.1)
Sodium: 132 mmol/L — ABNORMAL LOW (ref 135–145)

## 2022-10-18 MED ORDER — OSMOLITE 1.5 CAL PO LIQD
1000.0000 mL | ORAL | Status: DC
  Administered 2022-10-18 – 2022-10-24 (×11): 1000 mL
  Filled 2022-10-18 (×2): qty 1000

## 2022-10-18 MED ORDER — FREE WATER
100.0000 mL | Freq: Every day | Status: DC
  Administered 2022-10-18 – 2022-11-06 (×108): 100 mL

## 2022-10-18 NOTE — Progress Notes (Signed)
Mobility Specialist Progress Note    10/18/22 1511  Mobility  Activity Refused mobility   Pt c/o exhaustion. Stated he is apprehensive about going home with no surgery date. Will f/u as schedule permits.   Hildred Alamin Mobility Specialist  Please Psychologist, sport and exercise or Rehab Office at [phone]

## 2022-10-18 NOTE — Progress Notes (Signed)
      [address]       [place] [address]             [phone]      20 Days Post-Op Procedure(s) (LRB): ESOPHAGOGASTRODUODENOSCOPY (EGD) WITH PROPOFOL (N/A) ESOPHAGEAL STENT PLACEMENT (N/A) STENT REMOVAL  Subjective:  Patient without complaints.  Continues to have productive cough.  Ambulated 500 ft yesterday  Objective: Vital signs in last 24 hours: Temp:  [97.7 F (36.5 C)-98.6 F (37 C)] 98 F (36.7 C) (01/02 0400) Pulse Rate:  [90-98] 92 (01/01 2321) Cardiac Rhythm: Normal sinus rhythm (01/01 2001) Resp:  [17-20] 20 (01/01 2321) BP: (98-103)/(68-72) 98/68 (01/01 2321) SpO2:  [95 %-97 %] 95 % (01/01 2321) Weight:  [77.7 kg] 77.7 kg (01/02 0400)  Intake/Output from previous day: 01/01 0701 - 01/02 0700 In: 2374.4 [NG/GT:1913.8; IV Piggyback:430.7] Out: 1675 [Urine:1575; Drains:40; Chest Tube:60]  General appearance: alert, cooperative, and no distress Heart: regular rate and rhythm and tach Lungs: diminished right base Abdomen: soft, non-tender; bowel sounds normal; no masses,  no organomegaly Extremities: extremities normal, atraumatic, no cyanosis or edema Wound: clean andd ry  Lab Results: Recent Labs    10/18/22 0453  WBC 5.2  HGB 8.0*  HCT 25.1*  PLT 244   BMET:  Recent Labs    10/18/22 0453  NA 132*  K 4.3  CL 95*  CO2 28  GLUCOSE 163*  BUN 17  CREATININE 0.51*  CALCIUM 9.3    PT/INR: No results for input(s): "LABPROT", "INR" in the last 72 hours. ABG No results found for: "PHART", "HCO3", "TCO2", "ACIDBASEDEF", "O2SAT" CBG (last 3)  No results for input(s): "GLUCAP" in the last 72 hours.  Assessment/Plan: S/P Procedure(s) (LRB): ESOPHAGOGASTRODUODENOSCOPY (EGD) WITH PROPOFOL (N/A) ESOPHAGEAL STENT PLACEMENT (N/A) STENT REMOVAL  CV- ST, stable Pulm- CT and JP drain remain in place- output is low, will likely convert large CT to [facility] vs. Bulb drain soon Renal- creatinine is WNL, mild hyponatremia,  stable GI- NPO, tube feeds at goal, attempted to contact Nutrition yesterday w/o return of page... will attempt again today to have tube feedings for 18 hours ID- afebrile, no leukocytosis... will continue IV Zosyn at discharge, plan for Diflucan per tube.. ID is following Deconditioning- home PT/OT ordered Dispo- patient stable, will attempt to contact [facility] again today for transfer, if unable to accept patient will plan for d/c in AM as discussed with Dr. Kipp Brood with plan for outpatient follow up   LOS: 32 days   Ellwood Handler, PA-C 10/18/2022  Agree with above [name] [name]

## 2022-10-18 NOTE — TOC Progression Note (Addendum)
Transition of Care [facility]) - Progression Note    Patient Details  Name: [name] [name] MRN: [id number] Date of Birth: [birth year]  Transition [facility]) CM/SW Contact  Bartholomew Crews, RN Phone Number: [phone] 10/18/2022, 4:22 PM  Clinical Narrative:     Spoke with patient and spouse at the bedside. Patient asking when will outpatient procedure be scheduled stating he cannot go home until he has an appointment with [place] specialist. Patient and spouse expressed concerns and feeling like if he transitions home without an appointment that the "ball will be dropped" and he will be forgotten. Because of rough transition home last admission, their fear is further heightened. Telephone call to Dr. Donneta Romberg office to inquire about scheduled appointment - RNCM contact information provided for follow up.   Expected Discharge Plan: [place] Barriers to Discharge: Continued Medical Work up  Expected Discharge Plan and Services   Discharge Planning Services: CM Consult Post Acute Care Choice: Durable Medical Equipment, Home Health Living arrangements for the past 2 months: Single Family Home                 DME Arranged: Tube feeding pump, Tube feeding DME Agency: Other - [place]) Date DME Agency Contacted: 10/18/22 Time DME Agency Contacted: 1200 Representative spoke with at DME Agency: [place]: RN, PT, OT [facility] Agency: [place] Date [place]: 10/18/22 Time [place]: 1200 Representative spoke with at [place]: Amy   Social Determinants of Health ([place]) Interventions [place]: No Food Insecurity (09/18/2022)  Housing: Low Risk  (10/03/2022)  Transportation Needs: No Transportation Needs (10/09/2022)  Utilities: Not At Risk (10/09/2022)  Tobacco Use: Low Risk  (10/14/2022)    Readmission Risk Interventions     No data to display

## 2022-10-18 NOTE — TOC Progression Note (Signed)
Transition of Care [facility]) - Progression Note    Patient Details  Name: [name] [name] MRN: [id number] Date of Birth: 02-28-1961  Transition of [facility]) CM/SW Contact  Bartholomew Crews, RN Phone Number: [phone] 10/18/2022, 2:57 PM  Clinical Narrative:     Discussed disposition in progression - transition to [facility] vs home with home health tomorrow  Spoke with spouse, [name], at the bedside this morning to discuss post acute transition. Discussed decision for home IV antibiotics which spouse stated that she was not aware. Noted decision by ID on Friday 12/29 for IV zosyn. Patient has portacath for IV line. Spoke with Pam at [employer] who confirmed referral for IV antibiotics and ongoing supply of TF. Pam to provide education to spouse about how to administer IV antibiotic. Spouse advised that she will receive training from [facility] about how to administer IV antibiotics and plan for [facility] RN visit same day as discharge from hospital is planned. Confirmed with Amy at [facility] of following for [facility] RN, PT, OT and potential transition home tomorrow. [name] expressed concerns about transition home last admission and her expectations of a smoother transition home this time.   Noted provided note concerning chest tube and possible transition to [facility] for transition home. If transition home with [facility], bedside nursing to provide education to spouse for care at home and provide syringe to take home.  Spoke with liaison, Amy, at [place] to advise of potential transition home with [facility] - Amy confirmed that [facility] RN competent to provide care for [facility].   Dietitian provided additional teaching and updates for TF. TOC following for transition needs.   Expected Discharge Plan: [place] Barriers to Discharge: Continued Medical Work up  Expected Discharge Plan and Services   Discharge Planning Services: CM Consult Post Acute Care Choice: Durable Medical Equipment, Home  Health Living arrangements for the past 2 months: Single Family Home                 DME Arranged: Tube feeding pump, Tube feeding DME Agency: Other - [place]) Date DME Agency Contacted: 10/18/22 Time DME Agency Contacted: 1200 Representative spoke with at DME Agency: [place]: RN, PT, OT [facility] Agency: [place] Date [place]: 10/18/22 Time [place]: 1200 Representative spoke with at [place]: Amy   Social Determinants of Health ([place]) Interventions [place]: No Food Insecurity (10/04/2022)  Housing: Low Risk  (09/17/2022)  Transportation Needs: No Transportation Needs (09/17/2022)  Utilities: Not At Risk (09/29/2022)  Tobacco Use: Low Risk  (10/14/2022)    Readmission Risk Interventions     No data to display

## 2022-10-18 NOTE — Discharge Instructions (Signed)
NOTHING BY MOUTH... Patient may have 1 small cup of ice chips daily  2.  TUBE FEEDINGS- will run at 105 ml/hr for 18 hours per day from 2:00 pm- 8:00 am  3. FLUSH FEEDING TUBE WITH NS- 30 ml every 8 hours  4. FREE WATER PER FEEDING TUBE- 100 ml every 4 hours  5. LEAVE ALL DRAINS TO GRAVITY... DO NOT CHARGE BLAKE BULBS  6. PATIENT MAY SPONGE BATH  7. AMBULATE SEVERAL TIMES PER DAY  8. YOU MAY NOT DRIVE  9. CONTACT OUR OFFICE AT [phone] FOR ANY ISSUES

## 2022-10-18 NOTE — Progress Notes (Signed)
Dressing change to right surgical incision and around JP and chest tube.  Spouse at bedside and watched dressing change.  Tube feed restarted per new orders.    Pt and spouse expressed concerns about discharge home without appointment scheduled at [facility]  Requested for MD to be aware.  Dr. Abran [facility] RN notified and will pass message to MD/PA.  Case Management also aware of concerns.

## 2022-10-18 NOTE — Telephone Encounter (Signed)
Abigail Butts (Nurse case manager) from [facility] called to get an update on this patients next treatment plan asked for a call back to discuss further. [phone].

## 2022-10-18 NOTE — Progress Notes (Signed)
Nutrition Follow-up  DOCUMENTATION CODES:   Severe malnutrition in context of chronic illness  INTERVENTION:   At discharge, pt will require 8 cartons (1896 ml) of Osmolite 1.5 tube feeding formula daily. Tube feeds to be administered over 18 hours via feeding pump at rate of 105 ml/hr.  Transition to nocturnal/cyclic tube feeds via J-tube: - Osmolite 1.5 @ 105 ml/hr x 18 hours to run from 1400 to 0800 - Continue free water flushes of 100 ml 6 x daily (0800, 1100, 1300, 1500, 1800, 4008)  Nocturnal/cyclic tube feeding regimen provides 2835 kcal, 119 grams of protein, and 1440 ml of H2O.  Total water with current flushes: 2040 ml  - d/c PROSource TF  - Recommend d/c normal saline flushes per tube  - Recommend d/c Nutrisource Fiber per tube  NUTRITION DIAGNOSIS:   Severe Malnutrition related to chronic illness (esophageal cancer) as evidenced by moderate fat depletion, severe muscle depletion, percent weight loss (25.9% weight loss in less than 1 year).  Ongoing, being addressed via TF  GOAL:   Patient will meet greater than or equal to 90% of their needs  Met via TF  MONITOR:   Labs, Weight trends, TF tolerance, Skin  REASON FOR ASSESSMENT:   Consult Enteral/tube feeding initiation and management  ASSESSMENT:   62 y.o. male admits related to SOB and generalized weakness. PMH includes esophageal cancer. Pt is currently receiving medical management for fevers and failure to thrive.  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 Discussed transition with  RN, pt, pt's wife, and Carolynn Sayers with [employer].  Pt's with with questions about medications at d/c. Encouraged pt's wife to discuss with MD or PA. Discussed plan for free water flushes and for pt's wife to provide 6 free water flushes of 100 ml each during waking hours. A sample schedule for free water flushes was provided.  Pt's wife with questions regarding decreasing length of time for tube feeding administration and increasing rate. RD provided information for 16-hour feeds and 14-hour feeds. Encouraged pt's wife to only do 18-hour feeds at this time.  If 16-hour feeds desired, recommend: Osmolite 1.5 @ 120 ml/hr x 16 hours from 1600 to 0800. Pt would still require 8 total cartons of Osmolite 1.5 formula.  If 14-hour feeds desired, recommend: Osmolite 1.5 @ 135 ml/hr x 14 hours from 1800 to 0800. Pt would still require 8 total cartons of Osmolite 1.5 formula.  Admit weight: 80.5 kg Current weight: 77.7 kg  Current TF: Osmolite 1.5 @ 75 ml/hr, PROSource TF20 60 ml BID, free water flushes of 100 ml q 4 hours  Medications reviewed and include: nutrisource fiber 1 packet BID, IV zofran 4 mg daily at bedtime, IV protonix, IV diflucan, IV zosyn, normal saline flush 30 ml q 8 hours per tube  Labs reviewed: sodium 132, chloride 95, creatinine 0.51, hemoglobin 8.0 CBG's: 131-175 x 24 hours  UOP: 1575 ml x 24 hours 19 Fr R chest JP drain: 40 ml x 24 hours Chest tube: 60 ml x 24 hours I/O's: +11.3 L since admit  Diet Order:   Diet Order  Diet NPO time specified  Diet effective now                   EDUCATION NEEDS:   No education needs have been identified at this time  Skin:  Skin Assessment: Skin Integrity Issues: Incisions: right chest x 3  Last BM:  10/17/22  Height:   Ht Readings from Last 1 Encounters:  10/14/2022 _0  (1.778 m)    Weight:   Wt Readings from Last 1 Encounters:  10/18/22 77.7 kg    BMI:  Body mass index is 24.58 kg/m.  Estimated  Nutritional Needs:   Kcal:  2400-2700 kcals  Protein:  120-140 grams  Fluid:  >/= 2 L    Gustavus Bryant, MS, RD, LDN Inpatient Clinical Dietitian Please see [facility] for contact information.

## 2022-10-18 NOTE — Progress Notes (Signed)
   10/18/22 1700  Assess: MEWS Score  Temp 98.4 F (36.9 C)  BP 97/70  MAP (mmHg) 79  Pulse Rate (!) 108  ECG Heart Rate (!) 108  Resp 20  Level of Consciousness Alert  SpO2 96 %  O2 Device Room Air  Assess: MEWS Score  MEWS Temp 0  MEWS Systolic 1  MEWS Pulse 1  MEWS RR 0  MEWS LOC 0  MEWS Score 2  MEWS Score Color Yellow  Assess: if the MEWS score is Yellow or Red  Were vital signs taken at a resting state? Yes  Focused Assessment No change from prior assessment  Does the patient meet 2 or more of the SIRS criteria? No  Does the patient have a confirmed or suspected source of infection? No  Provider and Rapid Response Notified? No  MEWS guidelines implemented *See Row Information* Yes  Treat  MEWS Interventions Administered scheduled meds/treatments  Pain Scale 0-10  Pain Score 0  Take Vital Signs  Increase Vital Sign Frequency  Yellow: Q 2hr X 2 then Q 4hr X 2, if remains yellow, continue Q 4hrs  Escalate  MEWS: Escalate Yellow: discuss with charge nurse/RN and consider discussing with provider and RRT  Notify: Charge Nurse/RN  Name of Charge Nurse/RN Notified [place], RN  Date Charge Nurse/RN Notified 10/18/22  Time Charge Nurse/RN Notified 1807  Document  Patient Outcome Other (Comment) (remain on department)  Progress note created (see row info) Yes  Assess: SIRS CRITERIA  SIRS Temperature  0  SIRS Pulse 1  SIRS Respirations  0  SIRS WBC 0  SIRS Score Sum  1

## 2022-10-19 NOTE — Progress Notes (Addendum)
      [address]       [place] [address]             [phone]    21 Days Post-Op Procedure(s) (LRB): ESOPHAGOGASTRODUODENOSCOPY (EGD) WITH PROPOFOL (N/A) ESOPHAGEAL STENT PLACEMENT (N/A) STENT REMOVAL  Subjective:  Patient without new complaints.  He and his wife do not feel comfortable being discharged without [facility] follow up being arranged.  Objective: Vital signs in last 24 hours: Temp:  [98 F (36.7 C)-98.7 F (37.1 C)] 98 F (36.7 C) (01/03 0751) Pulse Rate:  [93-114] 93 (01/03 0410) Cardiac Rhythm: Normal sinus rhythm (01/03 0716) Resp:  [20-21] 21 (01/03 0751) BP: (97-105)/(64-72) 101/70 (01/03 0751) SpO2:  [95 %-98 %] 97 % (01/03 0751) Weight:  [77.7 kg] 77.7 kg (01/03 0410)  Intake/Output from previous day: 01/02 0701 - 01/03 0700 In: 766.9 [NG/GT:438.8; IV Piggyback:268.2] Out: 2080 [Urine:2075; Drains:5]  General appearance: alert, cooperative, and no distress Heart: regular rate and rhythm and tachy Lungs: diminished breath sounds diminished bilaterally Abdomen: soft, non-tender; bowel sounds normal; no masses,  no organomegaly Extremities: extremities normal, atraumatic, no cyanosis or edema Wound: clean and dry  Lab Results: Recent Labs    10/18/22 0453  WBC 5.2  HGB 8.0*  HCT 25.1*  PLT 244   BMET:  Recent Labs    10/18/22 0453  NA 132*  K 4.3  CL 95*  CO2 28  GLUCOSE 163*  BUN 17  CREATININE 0.51*  CALCIUM 9.3    PT/INR: No results for input(s): "LABPROT", "INR" in the last 72 hours. ABG No results found for: "PHART", "HCO3", "TCO2", "ACIDBASEDEF", "O2SAT" CBG (last 3)  No results for input(s): "GLUCAP" in the last 72 hours.  Assessment/Plan: S/P Procedure(s) (LRB): ESOPHAGOGASTRODUODENOSCOPY (EGD) WITH PROPOFOL (N/A) ESOPHAGEAL STENT PLACEMENT (N/A) STENT REMOVAL  CV- ST stable Pulm- CT and JP drain in place, output remains low GI- remains NPO, tube feedings adjusted for 18 hr cycle, continue  nutritional supplements ID- remains in Zosyn, Diflucan for persistent anastomotic leak, E. Coli from last OR cultures Dispo- patient stable, awaiting transfer vs. Outpatient follow up at [facility], this has been difficult to arranged, patient and spouse are uncomfortable with discharge due to complexity of patient's condition.. will remain hospitalized until arrangements are finalized   LOS: 33 days    Ellwood Handler, PA-C 10/19/2022   Agree with above Awaiting transfer to [place] Plan

## 2022-10-19 NOTE — Progress Notes (Signed)
OT Cancellation Note and Discharge  Patient Details Name: [name] [name] MRN: [id number] DOB: 1960-11-06   Cancelled Treatment:    Reason Eval/Treat Not Completed: Patient declined, no reason specified. Pt focused on getting procedure completed at [facility]. Educated pt, at length, in benefits of ADL training for independence, to reduce burden on wife and to increase strength and endurance. Pt continued to refuse. Encouraged ADLs at sink with nursing staff and increasing standing tolerance during ADLs. Pt verbalized understanding. OT signing off.   Malka So 10/19/2022, 2:48 PM Cleta Alberts, OTR/L Acute Rehabilitation Services Office: [phone]

## 2022-10-19 NOTE — Telephone Encounter (Signed)
[name], I have not seen the patient in quite a few weeks but I have been in communication with Dr. Kipp Brood, his thoracic surgeon. We have been trying for the last few weeks to get him transferred to [name] so that they have not attempted advanced endoscopic suturing to try and close the defect. Unfortunately even after having spoken myself with the transfer center as well as with advanced [facility] talking with their transfer center to try and get the patient transferred this never was able to occur. The patient was discharged home. I have spoken with Dr. Obie Dredge as well as with Dr. Barrie Folk about this patient. They should be working on getting the patient set up for a outpatient clinic evaluation and potential procedure. In the interim, please place an urgent referral to [facility] advanced [facility]oscopy (with these providers listed). This is all that we can do in the interim. Thanks. GM

## 2022-10-19 NOTE — Telephone Encounter (Signed)
Patient's wife called, to follow up on this patient's treatment plan. Requested a call back to discuss further.

## 2022-10-20 NOTE — Plan of Care (Signed)
  Problem: Clinical Measurements: Goal: Respiratory complications will improve Outcome: Progressing Goal: Cardiovascular complication will be avoided Outcome: Progressing   Problem: Activity: Goal: Risk for activity intolerance will decrease Outcome: Progressing   Problem: Elimination: Goal: Will not experience complications related to urinary retention Outcome: Progressing   Problem: Pain Managment: Goal: General experience of comfort will improve Outcome: Not Progressing

## 2022-10-20 NOTE — Plan of Care (Signed)
  Problem: Clinical Measurements: Goal: Respiratory complications will improve Outcome: Progressing Goal: Cardiovascular complication will be avoided Outcome: Progressing   Problem: Nutrition: Goal: Adequate nutrition will be maintained Outcome: Progressing   Problem: Elimination: Goal: Will not experience complications related to urinary retention Outcome: Progressing   Problem: Activity: Goal: Risk for activity intolerance will decrease Outcome: Not Progressing   Problem: Pain Managment: Goal: General experience of comfort will improve Outcome: Not Progressing

## 2022-10-20 NOTE — Telephone Encounter (Signed)
Thank you for update. GM 

## 2022-10-20 NOTE — Telephone Encounter (Signed)
I spoke with the pt wife she states that [facility] needs a referral form faxed with records.  I have faxed those again and made her aware that we faxed a referral to [facility] as urgent.

## 2022-10-20 NOTE — Telephone Encounter (Signed)
PT wife [name] [name] called back with more questions. Please advise

## 2022-10-20 NOTE — Progress Notes (Addendum)
      [address]       [place] [address]             [phone]       22 Days Post-Op Procedure(s) (LRB): ESOPHAGOGASTRODUODENOSCOPY (EGD) WITH PROPOFOL (N/A) ESOPHAGEAL STENT PLACEMENT (N/A) STENT REMOVAL  Subjective:  Patient had some nausea yesterday.  He is coughing quite a bit as well.    Objective: Vital signs in last 24 hours: Temp:  [98 F (36.7 C)-98.8 F (37.1 C)] 98.7 F (37.1 C) (01/04 0343) Pulse Rate:  [89-106] 89 (01/04 0343) Cardiac Rhythm: Normal sinus rhythm (01/04 0738) Resp:  [17-21] 17 (01/04 0343) BP: (101-111)/(65-77) 105/72 (01/04 0343) SpO2:  [97 %-98 %] 98 % (01/04 0343) Weight:  [79.1 kg] 79.1 kg (01/04 0352)  Intake/Output from previous day: 01/03 0701 - 01/04 0700 In: 6291.7 [NG/GT:5929.3; IV Piggyback:362.4] Out: 1855 [Urine:1825; Chest Tube:30]  General appearance: alert, cooperative, and no distress Heart: regular rate and rhythm Lungs: diminished breath sounds right base Abdomen: soft, non-tender; bowel sounds normal; no masses,  no organomegaly Extremities: extremities normal, atraumatic, no cyanosis or edema Wound: clean and dry  Lab Results: Recent Labs    10/18/22 0453  WBC 5.2  HGB 8.0*  HCT 25.1*  PLT 244   BMET:  Recent Labs    10/18/22 0453  NA 132*  K 4.3  CL 95*  CO2 28  GLUCOSE 163*  BUN 17  CREATININE 0.51*  CALCIUM 9.3    PT/INR: No results for input(s): "LABPROT", "INR" in the last 72 hours. ABG No results found for: "PHART", "HCO3", "TCO2", "ACIDBASEDEF", "O2SAT" CBG (last 3)  No results for input(s): "GLUCAP" in the last 72 hours.  Assessment/Plan: S/P Procedure(s) (LRB): ESOPHAGOGASTRODUODENOSCOPY (EGD) WITH PROPOFOL (N/A) ESOPHAGEAL STENT PLACEMENT (N/A) STENT REMOVAL  CV- ST Pulm- no acute issues, CT and Blake Drain remain in place, dark drainage persists, output remains low GI- NPO tube feedings adjusted to run 18 hrs, some minor nausea, no vomiting.. 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 continue Diflucan, Zosyn Dispo- patient stable, awaiting transfer vs. Outpatient follow up at [facility], will be ready for d/c once arrangements can be made   LOS: 34 days    Ellwood Handler, PA-C 10/20/2022  Agree with above Awaiting transfer to [facility]

## 2022-10-20 NOTE — Telephone Encounter (Signed)
I have spoken with the [name] [name] wife and advised her of the information.  FYI Dr Rush Landmark the [name] [name] is still hospitalized and does not want to leave the hospital until he is transferred to [facility]. I did explain to the [name] [name] wife that you have spoken to [facility] and have not been successful.  I will make the urgent referral and the [name] [name] wife is going to call.

## 2022-10-20 NOTE — Progress Notes (Addendum)
Mobility Specialist Progress Note    10/20/22 1417  Mobility  Activity Ambulated independently in hallway  Level of Assistance Modified independent, requires aide device or extra time  Assistive Device Four wheel walker  Distance Ambulated (ft) 420 ft (210+210)  Activity Response Tolerated well  Mobility Referral Yes  $Mobility charge 1 Mobility   Pre-Mobility: 93 HR During Mobility: 142 HR Post-Mobility: 139 HR  Pt received in bed and agreeable. No complaints on walk. Took x1 seated rest break. Returned to sink to brush teeth then to the BR. Encouraged to pull string when ready for assistance.  Hildred Alamin Mobility Specialist  Please Psychologist, sport and exercise or Rehab Office at [phone]

## 2022-10-21 ENCOUNTER — Telehealth: Payer: No Typology Code available for payment source | Admitting: Thoracic Surgery (Cardiothoracic Vascular Surgery)

## 2022-10-21 LAB — CREATININE, SERUM
Creatinine, Ser: 0.62 mg/dL (ref 0.61–1.24)
GFR, Estimated: >60 mL/min (ref >60)

## 2022-10-21 NOTE — Progress Notes (Addendum)
      [address]       [place] [address]             [phone]       23 Days Post-Op Procedure(s) (LRB): ESOPHAGOGASTRODUODENOSCOPY (EGD) WITH PROPOFOL (N/A) ESOPHAGEAL STENT PLACEMENT (N/A) STENT REMOVAL  Subjective:  Patient with no new concerns. Still coughing intermittently.  Objective: Vital signs in last 24 hours: Temp:  [98.3 F (36.8 C)-98.7 F (37.1 C)] 98.7 F (37.1 C) (01/05 0452) Pulse Rate:  [90-99] 90 (01/04 1748) Cardiac Rhythm: Sinus tachycardia (01/04 1954) Resp:  [17-21] 20 (01/05 0452) BP: (94-106)/(64-78) 94/75 (01/05 0452) SpO2:  [93 %-99 %] 99 % (01/05 0452) Weight:  [78.1 kg] 78.1 kg (01/05 0650)  Intake/Output from previous day: 01/04 0701 - 01/05 0700 In: 3060.2 [NG/GT:2614.5; IV Piggyback:345.7] Out: 300 [Urine:275; Chest Tube:25]  General appearance: alert, cooperative, and no distress Heart: regular rate and rhythm Lungs: diminished breath sounds right base Abdomen: soft, non-tender; bowel sounds normal; no masses,  no organomegaly Extremities: extremities normal, no edema Wounds and J-tube site clean and dry,   Lab Results: No results for input(s): "WBC", "HGB", "HCT", "PLT" in the last 72 hours.  BMET:  Recent Labs    10/21/22 0504  CREATININE 0.62     PT/INR: No results for input(s): "LABPROT", "INR" in the last 72 hours. ABG No results found for: "PHART", "HCO3", "TCO2", "ACIDBASEDEF", "O2SAT" CBG (last 3)  No results for input(s): "GLUCAP" in the last 72 hours.  Assessment/Plan: S/P Procedure(s) (LRB): ESOPHAGOGASTRODUODENOSCOPY (EGD) WITH PROPOFOL (N/A) ESOPHAGEAL STENT PLACEMENT (N/A) STENT REMOVAL  CV- ST Pulm- no acute issues, CT and Blake Drain remain in place, dark drainage persists, output 3m past 24 hours. GI- NPO. Tolerating tube feeding infusion 18 hrs/day, some minor nausea, no vomiting.. Continue protein supplements ID- persistent anastomotic leak, esophageal pleural fistulas,  stent in place. continue Diflucan, Zosyn Dispo- patient stable, awaiting transfer [facility]. [facility] will be ready for d/c once arrangements can be made.  New referral and documentation sent to [facility] yesterday per notes from Dr. MDonneta Rombergoffice.    LOS: 35 days    MAntony Odea PA-C 10/21/2022   Agree with above Awaiting transfer to [facility]

## 2022-10-22 NOTE — Progress Notes (Addendum)
      [address]       [place] [address]             [phone]       23 Days Post-Op Procedure(s) (LRB): ESOPHAGOGASTRODUODENOSCOPY (EGD) WITH PROPOFOL (N/A) ESOPHAGEAL STENT PLACEMENT (N/A) STENT REMOVAL  Subjective:  Patient with no new concerns. Has occasional productive cough. He said pain is controlled with Tylenol.   Objective: Vital signs in last 24 hours: Temp:  [97.9 F (36.6 C)-99.1 F (37.3 C)] 98.3 F (36.8 C) (01/06 0736) Pulse Rate:  [90-104] 100 (01/06 0736) Cardiac Rhythm: Normal sinus rhythm (01/06 0800) Resp:  [20-24] 21 (01/06 0736) BP: (99-110)/(66-70) 101/69 (01/06 0736) SpO2:  [95 %-99 %] 95 % (01/06 0800) Weight:  [78.7 kg] 78.7 kg (01/06 9470)  Intake/Output from previous day: 01/05 0701 - 01/06 0700 In: 2226.9 [NG/GT:1835.8; IV Piggyback:391.1] Out: 905 [Urine:900; Drains:5]  General appearance: alert, cooperative, and no distress Heart: regular rate and rhythm Abdomen: soft, non-tender Extremities: extremities without edema Wounds and J-tube site clean and dry,   Lab Results: No results for input(s): "WBC", "HGB", "HCT", "PLT" in the last 72 hours.  BMET:  Recent Labs    10/21/22 0504  CREATININE 0.62     PT/INR: No results for input(s): "LABPROT", "INR" in the last 72 hours. ABG No results found for: "PHART", "HCO3", "TCO2", "ACIDBASEDEF", "O2SAT" CBG (last 3)  No results for input(s): "GLUCAP" in the last 72 hours.  Assessment/Plan: S/P Procedure(s) (LRB): ESOPHAGOGASTRODUODENOSCOPY (EGD) WITH PROPOFOL (N/A) ESOPHAGEAL STENT PLACEMENT (N/A) STENT REMOVAL  CV- SR / ST Pulm- no acute issues, CT and Blake Drain remain in place. Both are now connected to bulb drains without any suction applied.  Dark drainage persists, output 5-36m past 24 hours. GI- NPO. Tolerating tube feeding infusion 18 hrs/day.  Continuing protein supplements ID- persistent anastomotic leak, esophageal pleural fistulas, stent in  place. continue Diflucan, Zosyn Dispo- patient stable, [facility] will be ready for d/c once arrangements can be made.  [facility] notes from Dr. MDonneta Rombergoffice.    LOS: 36 days   MAntony Odea PA-C 10/22/2022   Chart reviewed, patient examined, agree with above. He denies any complaints. Just wants to get out of the hospital. [facility] Continue current plans.

## 2022-10-22 NOTE — Progress Notes (Signed)
Jejunostomy site reddened with brown-green drainage in mod. amount.with foul odor.  Site cleansed with saline and wet to dry dressing done. PA made aware with order for wound care consult. Continue to monitor.

## 2022-10-22 NOTE — Plan of Care (Signed)
  Problem: Education: Goal: Knowledge of General Education information will improve Description: Including pain rating scale, medication(s)/side effects and non-pharmacologic comfort measures Outcome: Progressing   Problem: Clinical Measurements: Goal: Will remain free from infection Outcome: Progressing Goal: Diagnostic test results will improve Outcome: Progressing Goal: Cardiovascular complication will be avoided Outcome: Progressing   

## 2022-10-23 ENCOUNTER — Inpatient Hospital Stay (HOSPITAL_COMMUNITY): Payer: No Typology Code available for payment source

## 2022-10-23 LAB — CBC
HCT: 25.3 % — ABNORMAL LOW (ref 39.0–52.0)
Hemoglobin: 8.1 g/dL — ABNORMAL LOW (ref 13.0–17.0)
MCH: 31.9 pg (ref 26.0–34.0)
MCHC: 32 g/dL (ref 30.0–36.0)
MCV: 99.6 fL (ref 80.0–100.0)
Platelets: 283 10*3/uL (ref 150–400)
RBC: 2.54 MIL/uL — ABNORMAL LOW (ref 4.22–5.81)
RDW: 17.1 % — ABNORMAL HIGH (ref 11.5–15.5)
WBC: 7.8 10*3/uL (ref 4.0–10.5)
nRBC: 0 % (ref 0.0–0.2)

## 2022-10-23 LAB — BASIC METABOLIC PANEL
Anion gap: 9 (ref 5–15)
BUN: 14 mg/dL (ref 8–23)
CO2: 28 mmol/L (ref 22–32)
Calcium: 9.4 mg/dL (ref 8.9–10.3)
Chloride: 96 mmol/L — ABNORMAL LOW (ref 98–111)
Creatinine, Ser: 0.63 mg/dL (ref 0.61–1.24)
GFR, Estimated: >60 mL/min (ref >60)
Glucose, Bld: 201 mg/dL — ABNORMAL HIGH (ref 70–99)
Potassium: 4 mmol/L (ref 3.5–5.1)
Sodium: 133 mmol/L — ABNORMAL LOW (ref 135–145)

## 2022-10-23 MED ORDER — GERHARDT'S BUTT CREAM
TOPICAL_CREAM | Freq: Two times a day (BID) | CUTANEOUS | Status: DC
  Administered 2022-10-23 – 2022-11-07 (×3): 1 via TOPICAL
  Filled 2022-10-23 (×2): qty 1

## 2022-10-23 NOTE — Progress Notes (Signed)
Mobility Specialist: Progress Note   10/23/22 1558  Mobility  Activity Refused mobility   Pt refused mobility stating "not today." When asked why pt stated "not today." Pt then c/o feeling nauseous from feeding tube. Will f/u as able.   [place] Ndrew Creason Mobility Specialist Please contact via SecureChat or Rehab office at [phone]

## 2022-10-23 NOTE — Progress Notes (Addendum)
      [address]       [place] [address]             [phone]       23 Days Post-Op Procedure(s) (LRB): ESOPHAGOGASTRODUODENOSCOPY (EGD) WITH PROPOFOL (N/A) ESOPHAGEAL STENT PLACEMENT (N/A) STENT REMOVAL  Subjective:  [name] said he is having more discomfort at the J-tube insertion site.  Has occasional productive cough. He said pain is controlled with Tylenol.   Objective: Vital signs in last 24 hours: Temp:  [98.2 F (36.8 C)-98.9 F (37.2 C)] 98.7 F (37.1 C) (01/07 0716) Pulse Rate:  [82-117] 82 (01/07 0716) Cardiac Rhythm: Normal sinus rhythm (01/07 0700) Resp:  [18-26] 22 (01/07 0716) BP: (98-114)/(61-78) 105/67 (01/07 0716) SpO2:  [95 %-98 %] 95 % (01/07 0716)  Intake/Output from previous day: 01/06 0701 - 01/07 0700 In: 2478 [NG/GT:1832.8; IV Piggyback:345.2] Out: 1221 [Urine:1200; Drains:21]  General appearance: alert, cooperative, and no distress Heart: regular rate and rhythm Chest drains have minimal output. Abdomen: soft, non-tender Extremities: extremities without edema Wounds: the skin below the J-tube collar is inflamed Lab Results: Recent Labs    10/23/22 0651  WBC 7.8  HGB 8.1*  HCT 25.3*  PLT 283    BMET:  Recent Labs    10/21/22 0504 10/23/22 0651  NA  --  133*  K  --  4.0  CL  --  96*  CO2  --  28  GLUCOSE  --  201*  BUN  --  14  CREATININE 0.62 0.63  CALCIUM  --  9.4     PT/INR: No results for input(s): "LABPROT", "INR" in the last 72 hours. ABG No results found for: "PHART", "HCO3", "TCO2", "ACIDBASEDEF", "O2SAT" CBG (last 3)  No results for input(s): "GLUCAP" in the last 72 hours.  Assessment/Plan: S/P Procedure(s) (LRB): ESOPHAGOGASTRODUODENOSCOPY (EGD) WITH PROPOFOL (N/A) ESOPHAGEAL STENT PLACEMENT (N/A) STENT REMOVAL  CV- SR / ST Pulm- no acute issues, CT and Blake Drain remain in place and  connected to bulb drains without any suction applied.  Dark drainage persists, output 5-60m  past 24 hours. GI- NPO. Tolerating tube feeding infusion 18 hrs/day.  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 continue Diflucan, Zosyn Local skin irritation at the J-tube exit site. Will discuss treatment with the RN.  Dispo- patient stable[facility] will be ready for d/c once arrangements can be made.  [facility] from Dr. MDonneta Rombergoffice.    LOS: 37 days   MAntony Odea PA-C 10/23/2022   Chart reviewed, patient examined, agree with above. Feels about the same as he has except for pain around J-tube. CXR shows stable to slightly larger right pleural effusion.

## 2022-10-24 NOTE — TOC Progression Note (Addendum)
Transition of Care [facility]) - Progression Note    Patient Details  Name: [name] [name] MRN: [id number] Date of Birth: 04/11/1961  [facility] CM/SW Contact  Zenon Mayo, RN Phone Number: 10/24/2022, 2:22 PM  Clinical Narrative:    Per Patrick Jupiter PA we are still waiting on a bed from [place] on iv abx and tube feeds. TOC following.   Expected Discharge Plan: [place] Barriers to Discharge: Continued Medical Work up  Expected Discharge Plan and Services   Discharge Planning Services: CM Consult Post Acute Care Choice: Durable Medical Equipment, Home Health Living arrangements for the past 2 months: Single Family Home                 DME Arranged: Tube feeding pump, Tube feeding DME Agency: Other - [place]) Date DME Agency Contacted: 10/18/22 Time DME Agency Contacted: 1200 Representative spoke with at DME Agency: [place]: RN, PT, OT [facility] Agency: [place] Date [place]: 10/18/22 Time [place]: 1200 Representative spoke with at [place]: Amy   Social Determinants of Health ([place]) Interventions [place]: No Food Insecurity (10/13/2022)  Housing: Low Risk  (09/18/2022)  Transportation Needs: No Transportation Needs (10/15/2022)  Utilities: Not At Risk (09/27/2022)  Tobacco Use: Low Risk  (10/14/2022)    Readmission Risk Interventions     No data to display

## 2022-10-24 NOTE — Plan of Care (Signed)
  Problem: Education: Goal: Knowledge of General Education information will improve Description: Including pain rating scale, medication(s)/side effects and non-pharmacologic comfort measures Outcome: Progressing   Problem: Health Behavior/Discharge Planning: Goal: Ability to manage health-related needs will improve Outcome: Progressing   Problem: Clinical Measurements: Goal: Ability to maintain clinical measurements within normal limits will improve Outcome: Progressing Goal: Will remain free from infection Outcome: Progressing Goal: Diagnostic test results will improve Outcome: Progressing Goal: Respiratory complications will improve Outcome: Progressing Goal: Cardiovascular complication will be avoided Outcome: Progressing   Problem: Activity: Goal: Risk for activity intolerance will decrease Outcome: Progressing   Problem: Nutrition: Goal: Adequate nutrition will be maintained Outcome: Progressing   Problem: Coping: Goal: Level of anxiety will decrease Outcome: Progressing   Problem: Elimination: Goal: Will not experience complications related to bowel motility Outcome: Progressing Goal: Will not experience complications related to urinary retention Outcome: Progressing   Problem: Pain Managment: Goal: General experience of comfort will improve Outcome: Progressing   Problem: Safety: Goal: Ability to remain free from injury will improve Outcome: Progressing   Problem: Skin Integrity: Goal: Risk for impaired skin integrity will decrease Outcome: Progressing   Problem: Education: Goal: Knowledge of disease or condition will improve Outcome: Progressing Goal: Knowledge of the prescribed therapeutic regimen will improve Outcome: Progressing   Problem: Activity: Goal: Risk for activity intolerance will decrease Outcome: Progressing   Problem: Cardiac: Goal: Will achieve and/or maintain hemodynamic stability Outcome: Progressing   Problem: Clinical  Measurements: Goal: Postoperative complications will be avoided or minimized Outcome: Progressing   Problem: Respiratory: Goal: Respiratory status will improve Outcome: Progressing   Problem: Pain Management: Goal: Pain level will decrease Outcome: Progressing   Problem: Skin Integrity: Goal: Wound healing without signs and symptoms infection will improve Outcome: Progressing   Problem: Education: Goal: Knowledge of the prescribed therapeutic regimen will improve Outcome: Progressing   Problem: Bowel/Gastric: Goal: Gastrointestinal status for postoperative course will improve Outcome: Progressing   Problem: Nutritional: Goal: Ability to achieve adequate nutritional intake will improve Outcome: Progressing   Problem: Clinical Measurements: Goal: Postoperative complications will be avoided or minimized Outcome: Progressing   Problem: Respiratory: Goal: Ability to maintain a clear airway will improve Outcome: Progressing   Problem: Skin Integrity: Goal: Demonstration of wound healing without infection will improve Outcome: Progressing

## 2022-10-24 NOTE — Progress Notes (Addendum)
      [address]       [place] [address]             [phone]      26 Days Post-Op Procedure(s) (LRB): ESOPHAGOGASTRODUODENOSCOPY (EGD) WITH PROPOFOL (N/A) ESOPHAGEAL STENT PLACEMENT (N/A) STENT REMOVAL  Subjective:  Patient had some minor N/V last night.  He otherwise has no complaints.  Objective: Vital signs in last 24 hours: Temp:  [98.5 F (36.9 C)-98.9 F (37.2 C)] 98.5 F (36.9 C) (01/08 0434) Pulse Rate:  [93-101] 101 (01/07 1456) Cardiac Rhythm: Normal sinus rhythm (01/07 1900) Resp:  [19-22] 19 (01/08 0434) BP: (97-111)/(65-76) 107/65 (01/08 0434) SpO2:  [96 %-99 %] 99 % (01/08 0434) Weight:  [79.1 kg] 79.1 kg (01/08 0545)  Intake/Output from previous day: 01/07 0701 - 01/08 0700 In: 2393.5 [NG/GT:1899.5; IV Piggyback:294] Out: 1657 [Urine:1750; Drains:35]  General appearance: alert, cooperative, and no distress Heart: regular rate and rhythm and ST Lungs: diminished on right base Abdomen: soft, non-tender; bowel sounds normal; no masses,  no organomegaly Extremities: extremities normal, atraumatic, no cyanosis or edema Wound: clean and dry  Lab Results: Recent Labs    10/23/22 0651  WBC 7.8  HGB 8.1*  HCT 25.3*  PLT 283   BMET:  Recent Labs    10/23/22 0651  NA 133*  K 4.0  CL 96*  CO2 28  GLUCOSE 201*  BUN 14  CREATININE 0.63  CALCIUM 9.4    PT/INR: No results for input(s): "LABPROT", "INR" in the last 72 hours. ABG No results found for: "PHART", "HCO3", "TCO2", "ACIDBASEDEF", "O2SAT" CBG (last 3)  No results for input(s): "GLUCAP" in the last 72 hours.  Assessment/Plan: S/P Procedure(s) (LRB): ESOPHAGOGASTRODUODENOSCOPY (EGD) WITH PROPOFOL (N/A) ESOPHAGEAL STENT PLACEMENT (N/A) STENT REMOVAL  CV- ST, stable Pulm- Blake Drain x 2, 35 cc output daily GI- NPO, tolerating tube feedings, mild N/V overnight, continue protein supplements ID- persistent anastomotic leak, esophageal/pleural fistulas- continue  Zosyn, Diflucan 5.  Local skin irritation at J Tube exit site, no evidence of purulent drainage today 6.  Dispo- patient stable, awaiting evaluation vs. [facility] continue current care   LOS: 38 days   Ellwood Handler, PA-C 10/24/2022  Agree with above Pt is medically stable for discharge, but due to the complexity of his case, and home health care needs, we will keep this patient admitted until his outpatient GI appointment has been set.  He otherwise is stable for discharge, and transport to his outpatient GI appointment.  Kiaraliz Rafuse Bary Leriche

## 2022-10-24 NOTE — Progress Notes (Signed)
Mobility Specialist Progress Note    10/24/22 1537  Mobility  Activity Refused mobility   Pt stated he is in pain and his feeding tube really hurts. Will f/u as schedule permits. RN notified.   Hildred Alamin Mobility Specialist  Please Psychologist, sport and exercise or Rehab Office at [phone]

## 2022-10-24 NOTE — Progress Notes (Signed)
      [address]       [place] [address]             [phone]    Received a call from the 2 central  unit that the patient's wife was contacted by [facility].  Reportedly they need a note that he is stable for transfer.  This patient can be transferred at any time as he requires the procedures that [facility] can provide and he is otherwise medically stable to proceed with transfer.   John Giovanni, PA-C

## 2022-10-25 MED ORDER — OSMOLITE 1.5 CAL PO LIQD: 1000.0000 mL | ORAL | Status: DC

## 2022-10-25 MED ORDER — OSMOLITE 1.5 CAL PO LIQD
1000.0000 mL | ORAL | Status: DC
  Administered 2022-10-25 – 2022-10-28 (×6): 1000 mL

## 2022-10-25 NOTE — Progress Notes (Signed)
At request of patient's wife, called [facility] to assist with OP arrangement. Spoke with Tiffany (advanced scheduler in biliary department) who stated a peer to peer is in process and once that is completed between Dr. Harl Bowie, Dr. Kipp Brood and Dr. Allison Quarry they will get procedure scheduled. Per Jonelle Sidle, she will call patient's wife to give procedure date and time and will also update Dr. Kipp Brood with this information as well.

## 2022-10-25 NOTE — Progress Notes (Signed)
Nutrition Follow-up  DOCUMENTATION CODES:   Severe malnutrition in context of chronic illness  INTERVENTION:   Pt reports abdominal discomfort/fullness after just couple of hours of 18 hour TF infusion at rate of 105 ml/hr. Discussed options to trial 20 hour or 24 hour feedings with RN, pt. Pt would like to try 20 hour feedings first.   Tube Feeding via J-tube: plan for  Osmolite 1.5 at 95 ml/hr x 20 hours daily (infuse from 1400 to 1000) Free water flush of 100 mL q 4 hours (600 ml/24 hours)    Maintain HOB >30 degrees at all times while TF infusing. Pt should NOT lie flat to sleep at night when TF infusing  Recommend trial of pro-motility agent such as Reglan or Erythromycin to address nausea, possible delayed emptying. Reached out to Surgery to discuss  NUTRITION DIAGNOSIS:   Severe Malnutrition related to chronic illness (esophageal cancer) as evidenced by moderate fat depletion, severe muscle depletion, percent weight loss (25.9% weight loss in less than 1 year).  Being addressed via TF   GOAL:   Patient will meet greater than or equal to 90% of their needs  Met via TF   MONITOR:   Labs, Weight trends, TF tolerance, Skin  REASON FOR ASSESSMENT:   Consult Enteral/tube feeding initiation and management  ASSESSMENT:   62 y.o. male admits related to SOB and generalized weakness. PMH includes esophageal cancer. Pt is currently receiving medical management for fevers and failure to thrive.  Awaiting transfer to [place] worsening pain around J-tube. [place] RN consulted; noted request by [place] to remove sutures (sutures in place x 2 months) as unable to assess site due to sutures holding external bumper tightly in place, also prevents topical options.   Currently continues with Osmolite 1.5 at 105 ml/hr x 18 hours daily (infuses from 1400 to 0800). Noted pt continues with some N/V at night. Noted pt continues with scheduled zofran at night. Pt also indicating abdominal  fullness/discomfort after 2 hours of TF infusion  Free water 100 mL q 6 hours per J-tube  +large BM on 1/07 Last BMP from 1/07  Weight appears stable over the past week  Labs: sodium 133, Creatinine wdl Meds: reviewed  Diet Order:   Diet Order             Diet NPO time specified  Diet effective now                   EDUCATION NEEDS:   No education needs have been identified at this time  Skin:  Skin Assessment: Skin Integrity Issues: Skin Integrity Issues:: Incisions Incisions: pain at J-tube site, WOC RN consulted; chest incisions  Last BM:  1/7 large type 7  Height:   Ht Readings from Last 1 Encounters:  09/27/2022 '5\' 10"'$  (1.778 m)    Weight:   Wt Readings from Last 1 Encounters:  10/25/22 78.5 kg    Ideal Body Weight:     BMI:  Body mass index is 24.83 kg/m.  Estimated Nutritional Needs:   Kcal:  2400-2700 kcals  Protein:  120-140 grams  Fluid:  >/= 2 L  Kerman Passey MS, RDN, LDN, CNSC Registered Dietitian 3 Clinical Nutrition RD Pager and On-Call Pager Number Located in [place]

## 2022-10-25 NOTE — Progress Notes (Addendum)
      [address]       [place], [address]             [phone]      27 Days Post-Op Procedure(s) (LRB): ESOPHAGOGASTRODUODENOSCOPY (EGD) WITH PROPOFOL (N/A) ESOPHAGEAL STENT PLACEMENT (N/A) STENT REMOVAL Subjective: Patient states he had some vomiting around 5:00PM last night but the nausea and vomiting has overall improved.   Objective: Vital signs in last 24 hours: Temp:  [98.5 F (36.9 C)-99.6 F (37.6 C)] 98.9 F (37.2 C) (01/09 0400) Pulse Rate:  [95-103] 98 (01/09 0400) Cardiac Rhythm: Normal sinus rhythm (01/08 2000) Resp:  [20-29] 20 (01/09 0400) BP: (100-107)/(67-75) 100/69 (01/09 0400) SpO2:  [96 %-98 %] 96 % (01/09 0400) Weight:  [78.5 kg] 78.5 kg (01/09 0500)  Hemodynamic parameters for last 24 hours:    Intake/Output from previous day: 01/08 0701 - 01/09 0700 In: 3147.7 [NG/GT:2720; IV Piggyback:367.7] Out: 1765 [Urine:1750; Drains:15] Intake/Output this shift: No intake/output data recorded.  General appearance: alert, cooperative, and no distress Neurologic: intact Heart: regular rate and rhythm to sinus tachycardia, no murmur Lungs: diminished right basilar breath sounds Abdomen: soft, non-tender; bowel sounds normal; no masses,  no organomegaly Extremities: extremities normal, atraumatic, no cyanosis or edema Wound: Clean and dry, no erythema or sign of infection  Lab Results: Recent Labs    10/23/22 0651  WBC 7.8  HGB 8.1*  HCT 25.3*  PLT 283   BMET:  Recent Labs    10/23/22 0651  NA 133*  K 4.0  CL 96*  CO2 28  GLUCOSE 201*  BUN 14  CREATININE 0.63  CALCIUM 9.4    PT/INR: No results for input(s): "LABPROT", "INR" in the last 72 hours. ABG No results found for: "PHART", "HCO3", "TCO2", "ACIDBASEDEF", "O2SAT" CBG (last 3)  No results for input(s): "GLUCAP" in the last 72 hours.  Assessment/Plan: S/P Procedure(s) (LRB): ESOPHAGOGASTRODUODENOSCOPY (EGD) WITH PROPOFOL (N/A) ESOPHAGEAL STENT PLACEMENT  (N/A) STENT REMOVAL  CV: NSR-ST, HR 95-108. Stable hemodynamics, SBP 107.   Pulm: Blake drain x 2, 15cc/24hours. CXR 01/07 showed small right pleural effusion a little larger towards the apex.   GI: Tolerating tube feeds. Mild nausea and vomiting around 5:00PM after tube feeds started at [facility], states it has improved. Continue scheduled and PRN Zofran. Continue protein supplements. +BM yesterday.  Renal: Good UO. Cr 0.63.  ID: Afebrile, WBC 7.8. Persistent anastomotic leak, esophogeal/pleural fistulas. Continue Zosyn and Diflucan. Local skin irritation at J tube exit site, no purulent drainage today. Continue dressing changes.   Dispo: Patient is medically stable for discharge and transport to outpatient GI appointment. Due to the complexity of his case and home health needs we will keep him admitted in the hospital until outpatient [place] appointment has been set. Continue current care.    LOS: 23 days    Magdalene River, PA-C 10/25/2022  Agree with above Awaiting outpatient appointment with GI at [place] discharge on the day of the appointment  Laraya Pestka Bary Leriche

## 2022-10-25 NOTE — Consult Note (Addendum)
WOC Nurse Consult Note: [place] team consult for pain around jejunostomy tube  Reason for Consult: worsening pain   Unable to assess jejunostomy tube due to sutures holding face plate tightly in place.  This also prevents topical options.  Secure chat sent to surgeon and PA with this observation and asking if it is appropriate to remove sutures that have been in place since 08/18/2022.   Please provide an order for bedside nurse to remove sutures if in agreement.    Discussed POC with patient and significant other.    Please re-consult if further assistance is needed.  Thank you,  Bettina Warn MSN, RN-BC, [employer]

## 2022-10-26 LAB — BASIC METABOLIC PANEL
Anion gap: 7 (ref 5–15)
BUN: 17 mg/dL (ref 8–23)
CO2: 29 mmol/L (ref 22–32)
Calcium: 9.6 mg/dL (ref 8.9–10.3)
Chloride: 93 mmol/L — ABNORMAL LOW (ref 98–111)
Creatinine, Ser: 0.63 mg/dL (ref 0.61–1.24)
GFR, Estimated: >60 mL/min (ref >60)
Glucose, Bld: 206 mg/dL — ABNORMAL HIGH (ref 70–99)
Potassium: 4.1 mmol/L (ref 3.5–5.1)
Sodium: 129 mmol/L — ABNORMAL LOW (ref 135–145)

## 2022-10-26 LAB — CBC
HCT: 26.2 % — ABNORMAL LOW (ref 39.0–52.0)
Hemoglobin: 8.2 g/dL — ABNORMAL LOW (ref 13.0–17.0)
MCH: 30.7 pg (ref 26.0–34.0)
MCHC: 31.3 g/dL (ref 30.0–36.0)
MCV: 98.1 fL (ref 80.0–100.0)
Platelets: 293 10*3/uL (ref 150–400)
RBC: 2.67 MIL/uL — ABNORMAL LOW (ref 4.22–5.81)
RDW: 15.9 % — ABNORMAL HIGH (ref 11.5–15.5)
WBC: 9.8 10*3/uL (ref 4.0–10.5)
nRBC: 0 % (ref 0.0–0.2)

## 2022-10-26 MED ORDER — KETOROLAC TROMETHAMINE 30 MG/ML IJ SOLN
30.0000 mg | Freq: Four times a day (QID) | INTRAMUSCULAR | Status: AC
  Administered 2022-10-26 – 2022-10-31 (×18): 30 mg via INTRAVENOUS
  Filled 2022-10-26 (×19): qty 1

## 2022-10-26 NOTE — Progress Notes (Addendum)
      [address]       [place] [address]             [phone]      28 Days Post-Op Procedure(s) (LRB): ESOPHAGOGASTRODUODENOSCOPY (EGD) WITH PROPOFOL (N/A) ESOPHAGEAL STENT PLACEMENT (N/A) STENT REMOVAL Subjective: Patient states his pain is tolerable and he had no nausea or vomiting yesterday with tube feeds.  Objective: Vital signs in last 24 hours: Temp:  [98.2 F (36.8 C)-98.8 F (37.1 C)] 98.8 F (37.1 C) (01/10 0500) Pulse Rate:  [91-110] 107 (01/10 0550) Cardiac Rhythm: Sinus tachycardia (01/09 2100) Resp:  [20-24] 22 (01/10 0500) BP: (94-115)/(67-77) 114/77 (01/10 0500) SpO2:  [95 %-98 %] 98 % (01/10 0500) Weight:  [77.1 kg] 77.1 kg (01/10 0550)  Hemodynamic parameters for last 24 hours:    Intake/Output from previous day: 01/09 0701 - 01/10 0700 In: 3282.9 [NG/GT:2805; IV Piggyback:357.9] Out: 1507.5 [Urine:1500; Drains:7.5] Intake/Output this shift: No intake/output data recorded.  General appearance: alert, cooperative, and no distress Neurologic: intact Heart: Regular rate and rhythm/ST, no murmur Lungs: Diminished right basilar lung sounds Abdomen: soft, non-tender; bowel sounds normal; no masses,  no organomegaly Extremities: extremities normal, atraumatic, no cyanosis or edema Wound: Right sided JP drain dressing saturated with brown discharge. Left J tube site with macerated skin edges, clean and dry dressing.   Lab Results: No results for input(s): "WBC", "HGB", "HCT", "PLT" in the last 72 hours. BMET: No results for input(s): "NA", "K", "CL", "CO2", "GLUCOSE", "BUN", "CREATININE", "CALCIUM" in the last 72 hours.  PT/INR: No results for input(s): "LABPROT", "INR" in the last 72 hours. ABG No results found for: "PHART", "HCO3", "TCO2", "ACIDBASEDEF", "O2SAT" CBG (last 3)  No results for input(s): "GLUCAP" in the last 72 hours.  Assessment/Plan: S/P Procedure(s) (LRB): ESOPHAGOGASTRODUODENOSCOPY (EGD) WITH PROPOFOL  (N/A) ESOPHAGEAL STENT PLACEMENT (N/A) STENT REMOVAL  CV: ST with some PVCs, HR 107 this AM. Stable hemodynamics, SBP 114.    Pulm: Saturating well on RA. R sided Blake drain x 2, 7.5cc/24hours.  CXR 01/07 showed small right pleural effusion a little larger towards the apex.    GI: Tolerating tube feeds. No nausea and vomiting or fullness last night with tube feeds. Will continue current Zofran and tube feed regimen.     Renal: Good UO. Cr 0.63.   ID: Afebrile, last WBC 7.8. Persistent anastomotic leak, esophogeal/pleural fistulas. Continue Zosyn and Diflucan. Local skin irritation at J tube exit site, no purulent drainage today. Continue dressing changes with Gerhardts butt cream. Wound care consulted, will discuss with Dr. Kipp Brood about stitch removal. Right JP Dressings soiled with brown discharge this AM. On exam there is a pocket around JP exit site, increased in size. Continue dressing changes, will discuss with surgeon.    Dispo: Patient is medically stable for discharge and transport to outpatient GI appointment. Due to the complexity of his case and home health needs we will keep him admitted in the hospital until outpatient [place] appointment has been set and will discharge him on the day of appointment. Will discuss wound care with surgeon.     LOS: 40 days    Magdalene River, PA-C 10/26/2022

## 2022-10-26 NOTE — Consult Note (Signed)
Followed up with patient this morning regarding jejunostomy tube.  Sutures still in place.  Communicated with surgical team regarding this 10/25/2022.  It is noted in PAs note that this will be discussed with surgeon. WOC is unable to recommend any topical treatment at this time due to face plate being tightly sutured.  Will follow.    Thank you,  Helix Lafontaine MSN, RN-BC, [employer]

## 2022-10-26 NOTE — Progress Notes (Signed)
Mobility Specialist Progress Note    10/26/22 1421  Mobility  Activity Ambulated with assistance in hallway  Level of Assistance Standby assist, set-up cues, supervision of patient - no hands on  Assistive Device Four wheel walker  Distance Ambulated (ft) 260 ft (50+160+50)  Activity Response Tolerated fair  Mobility Referral Yes  $Mobility charge 1 Mobility   Pre-Mobility: 121 HR During Mobility: 145 HR Post-Mobility: 121 HR  Pt received in bed and agreeable. C/o body hurting all over and specific pain at feeding tube site. Took x2 seated rest breaks. Returned to bed with call bell in reach.    Hildred Alamin Mobility Specialist  Please Psychologist, sport and exercise or Rehab Office at [phone]

## 2022-10-26 NOTE — Progress Notes (Signed)
Mobility Specialist Progress Note    10/26/22 0927  Mobility  Activity Ambulated with assistance to bathroom  Level of Assistance Contact guard assist, steadying assist  Assistive Device Front wheel walker  Distance Ambulated (ft) 20 ft (10+10)  Activity Response Tolerated well  Mobility Referral Yes  $Mobility charge 1 Mobility   During Mobility: 137 HR Post-Mobility: 113 HR  Pt received and agreeable. No complaints. Returned to bed with call bell in reach.    Hildred Alamin Mobility Specialist  Please Psychologist, sport and exercise or Rehab Office at [phone]

## 2022-10-27 ENCOUNTER — Telehealth: Payer: Self-pay | Admitting: Gastroenterology

## 2022-10-27 LAB — SODIUM: Sodium: 132 mmol/L — ABNORMAL LOW (ref 135–145)

## 2022-10-27 MED ORDER — SODIUM CHLORIDE 0.9% FLUSH
60.0000 mL | Freq: Four times a day (QID) | INTRAVENOUS | Status: DC
  Administered 2022-10-27 – 2022-11-06 (×40): 60 mL

## 2022-10-27 NOTE — Progress Notes (Signed)
Brief Nutrition Support Note  PA reached out to RD regarding restarting saline flushes per J-tube to address pt's hyponatremia.  Pt previously receiving 30 ml sodium chloride 0.9% flushes per tube q 8 hours which provided only 0.81 grams of sodium chloride daily. It is unlikely that this amount of sodium chloride was having any meaningful impact on pt's hyponatremia. These flushes were discontinued.  Discussed the above with PA. Recommend ordering 60 ml sodium chloride 0.9% flushes per tube q 6 hours to provide a total of 2.16 grams of sodium chloride daily. PA in agreement. Orders placed.  Also recommend monitoring daily sodium labs.  RD will continue to follow pt during admission.   Gustavus Bryant, MS, RD, LDN Inpatient Clinical Dietitian Please see AMiON for contact information.

## 2022-10-27 NOTE — Telephone Encounter (Signed)
PT is wanting to speak to nurse pertaining to the original fax to [facility]. Please advise

## 2022-10-27 NOTE — Progress Notes (Addendum)
[address]       [place] [address]             [phone]      29 Days Post-Op Procedure(s) (LRB): ESOPHAGOGASTRODUODENOSCOPY (EGD) WITH PROPOFOL (N/A) ESOPHAGEAL STENT PLACEMENT (N/A) STENT REMOVAL Subjective: Patient states he feels better this morning after given Toradol, he had no nausea or vomiting last night.   Objective: Vital signs in last 24 hours: Temp:  [98.5 F (36.9 C)-99.5 F (37.5 C)] 98.6 F (37 C) (01/11 0503) Pulse Rate:  [95] 95 (01/11 0503) Cardiac Rhythm: Normal sinus rhythm (01/11 0740) Resp:  [18-27] 20 (01/11 0503) BP: (95-116)/(59-75) 99/64 (01/11 0503) SpO2:  [96 %] 96 % (01/11 0503) Weight:  [80 kg] 80 kg (01/11 0618)  Hemodynamic parameters for last 24 hours:    Intake/Output from previous day: 01/10 0701 - 01/11 0700 In: 2455.5 [NG/GT:2054.3; IV Piggyback:341.2] Out: 600 [Urine:600] Intake/Output this shift: No intake/output data recorded.  General appearance: alert, cooperative, and no distress Neurologic: intact Heart: Sinus tachycardia, no murmur Lungs: Slightly diminished R sided breath sounds Abdomen: soft, non-tender; bowel sounds normal; no masses,  no organomegaly Extremities: extremities normal, atraumatic, no cyanosis or edema Wound: J tube with macerated skin edges, no purulent drainage today. R sided bulbs with clean and dry dressing. Brown output in bulbs, no brown drainage around exit site this AM.   Lab Results: Recent Labs    10/26/22 0842  WBC 9.8  HGB 8.2*  HCT 26.2*  PLT 293   BMET:  Recent Labs    10/26/22 0842  NA 129*  K 4.1  CL 93*  CO2 29  GLUCOSE 206*  BUN 17  CREATININE 0.63  CALCIUM 9.6    PT/INR: No results for input(s): "LABPROT", "INR" in the last 72 hours. ABG No results found for: "PHART", "HCO3", "TCO2", "ACIDBASEDEF", "O2SAT" CBG (last 3)  No results for input(s): "GLUCAP" in the last 72 hours.  Assessment/Plan: S/P Procedure(s)  (LRB): ESOPHAGOGASTRODUODENOSCOPY (EGD) WITH PROPOFOL (N/A) ESOPHAGEAL STENT PLACEMENT (N/A) STENT REMOVAL  Neuro: Pain better controlled this AM   CV: NSR-Sinus tachycardia, HR 93-103 this AM. Stable hemodynamics, SBP 95-116   Pulm: Saturating well on RA. R sided Blake drain x 2, no output recorded. Minimal drainage in bulbs this AM.    GI: Tolerating tube feeds. No nausea and vomiting and less fullness last night with tube feeds. Will continue current Zofran and tube feed regimen.     Renal: UO not all recorded. Cr 0.63.   ID: Afebrile, last WBC 9.8. Persistent anastomotic leak, esophogeal/pleural fistulas. Continue Zosyn and Diflucan. Local skin irritation at J tube exit site, no purulent drainage today. Continue dressing changes with Gerhardts butt cream. Wound care consulted, states they cannot apply topical products due to tightness of plate at J tube, discussed with surgeon and cannot remove sutures. Right balke drain dressings clean and dry this morning. Continue dressing changes.   Hyponatremia: Na 129, was on sodium chloride flushes, will discuss with nutrition if these should be restarted.   Dispo: Patient is medically stable for discharge and transport to outpatient GI appointment. Due to the complexity of his case and home health needs we will keep him admitted in the hospital until outpatient [place] appointment has been set. Patient states appointment has been set for February 8th, will plan to discharge February 7th. Continue current treatment.   LOS: 41 days    Magdalene River, PA-C 10/27/2022  Agree with above. Jani Moronta Bary Leriche

## 2022-10-27 NOTE — Telephone Encounter (Signed)
The pt wife is concerned that the appt with [facility] is in Feb.  I have made her aware that she can call [facility] and ask to be put on a wait list.  She thanked me for returning the call and will speak with [facility].  She will call back with any further concerns.

## 2022-10-28 LAB — CREATININE, SERUM
Creatinine, Ser: 0.71 mg/dL (ref 0.61–1.24)
GFR, Estimated: >60 mL/min (ref >60)

## 2022-10-28 MED ORDER — OSMOLITE 1.5 CAL PO LIQD
1000.0000 mL | ORAL | Status: DC
  Administered 2022-10-28 – 2022-11-06 (×16): 1000 mL
  Filled 2022-10-28 (×2): qty 1000

## 2022-10-28 NOTE — Progress Notes (Signed)
Mobility Specialist Progress Note    10/28/22 1151  Mobility  Activity Ambulated with assistance in hallway  Level of Assistance Standby assist, set-up cues, supervision of patient - no hands on  Assistive Device Four wheel walker  Distance Ambulated (ft) 420 ft (100+90+100+130)  Activity Response Tolerated well  Mobility Referral Yes  $Mobility charge 1 Mobility   Pre-Mobility: 92 HR During Mobility: 133 HR Post-Mobility: 112 HR  Pt received in bed and agreeable. No complaints on walk. Took x3 seated rest breaks. Returned to bed with call bell in reach.    Hildred Alamin Mobility Specialist  Please Psychologist, sport and exercise or Rehab Office at [phone]

## 2022-10-28 NOTE — Progress Notes (Signed)
[address]       [place] [address]             [phone]      30 Days Post-Op Procedure(s) (LRB): ESOPHAGOGASTRODUODENOSCOPY (EGD) WITH PROPOFOL (N/A) ESOPHAGEAL STENT PLACEMENT (N/A) STENT REMOVAL Subjective: Patient states he feels fine today, he just has a lingering cough.  Objective: Vital signs in last 24 hours: Temp:  [98.3 F (36.8 C)-100.1 F (37.8 C)] 98.3 F (36.8 C) (01/12 0821) Pulse Rate:  [93-123] 98 (01/12 0821) Cardiac Rhythm: Normal sinus rhythm (01/12 0737) Resp:  [21-25] 24 (01/12 0821) BP: (100-123)/(60-72) 104/62 (01/12 0821) SpO2:  [95 %-98 %] 95 % (01/12 0349) Weight:  [78.2 kg] 78.2 kg (01/12 0349)  Hemodynamic parameters for last 24 hours:    Intake/Output from previous day: 01/11 0701 - 01/12 0700 In: 300 [P.O.:120; I.V.:60] Out: 1050 [Urine:1050] Intake/Output this shift: Total I/O In: -  Out: 500 [Urine:500]  General appearance: alert, cooperative, and no distress Neurologic: intact Heart: NSR-ST, no murmur Lungs: Diminished right sided breath sounds Abdomen: soft, non-tender; bowel sounds normal; no masses,  no organomegaly Extremities: extremities normal, atraumatic, no cyanosis or edema Wound: J tube exit site with macerated skin. Bulb drains with clean and dry dressing.   Lab Results: Recent Labs    10/26/22 0842  WBC 9.8  HGB 8.2*  HCT 26.2*  PLT 293   BMET:  Recent Labs    10/26/22 0842 10/27/22 1208 10/28/22 0715  NA 129* 132*  --   K 4.1  --   --   CL 93*  --   --   CO2 29  --   --   GLUCOSE 206*  --   --   BUN 17  --   --   CREATININE 0.63  --  0.71  CALCIUM 9.6  --   --     PT/INR: No results for input(s): "LABPROT", "INR" in the last 72 hours. ABG No results found for: "PHART", "HCO3", "TCO2", "ACIDBASEDEF", "O2SAT" CBG (last 3)  No results for input(s): "GLUCAP" in the last 72 hours.  Assessment/Plan: S/P Procedure(s) (LRB): ESOPHAGOGASTRODUODENOSCOPY (EGD) WITH PROPOFOL  (N/A) ESOPHAGEAL STENT PLACEMENT (N/A) STENT REMOVAL  Neuro: Pain controlled this AM    CV: NSR-Sinus tachycardia. Stable hemodynamics, SBP 100-123   Pulm: Saturating well on RA. R sided Blake drain x 2, no output recorded. Minimal serosanguinous drainage in bulbs this AM. Cough, sometimes productive, will monitor.    GI: Tolerating tube feeds. No nausea and vomiting. Will continue current Zofran and tube feed regimen.     Renal: Good UO. Cr 0.71.   ID: Afebrile, last WBC 9.8. Persistent anastomotic leak, esophogeal/pleural fistulas. On Zosyn and Diflucan. Per Dr. Kipp Brood will discontinue Zosyn and Diflucan.  Wounds: Local skin irritation and maceration at J tube exit site. Dressing saturated with yellow drainage this AM. Gerhardt's barrier cream was applied under plate and dressing was changed. Continue dressing changes. Wound care consulted but could not update recommendations due to tightness of J tube plate. Plan to leave sutures in place.    Hyponatremia: Na 132, started on sodium chloride flushes. Will monitor.  Dispo: Patient is medically stable for discharge and transport to outpatient GI appointment. Due to the complexity of his case and home health needs we will keep him admitted in the hospital until outpatient [place] appointment has been set. Patient states appointment has been set for February 8th, will plan to discharge  February 7th. Will plan to discontinue Zosyn and Diflucan today.      LOS: 42 days    Magdalene River, PA-C 10/28/2022

## 2022-10-29 LAB — SODIUM: Sodium: 131 mmol/L — ABNORMAL LOW (ref 135–145)

## 2022-10-29 LAB — CBC
HCT: 22.6 % — ABNORMAL LOW (ref 39.0–52.0)
Hemoglobin: 7.2 g/dL — ABNORMAL LOW (ref 13.0–17.0)
MCH: 31.4 pg (ref 26.0–34.0)
MCHC: 31.9 g/dL (ref 30.0–36.0)
MCV: 98.7 fL (ref 80.0–100.0)
Platelets: 252 10*3/uL (ref 150–400)
RBC: 2.29 MIL/uL — ABNORMAL LOW (ref 4.22–5.81)
RDW: 15.5 % (ref 11.5–15.5)
WBC: 8.4 10*3/uL (ref 4.0–10.5)
nRBC: 0 % (ref 0.0–0.2)

## 2022-10-29 NOTE — Progress Notes (Signed)
[address]       ,[place] [address]             [phone]     31 Days Post-Op Procedure(s) (LRB): ESOPHAGOGASTRODUODENOSCOPY (EGD) WITH PROPOFOL (N/A) ESOPHAGEAL STENT PLACEMENT (N/A) STENT REMOVAL Subjective: No new changes, frustrated  Objective: Vital signs in last 24 hours: Temp:  [98.5 F (36.9 C)-99.6 F (37.6 C)] 98.5 F (36.9 C) (01/13 0829) Pulse Rate:  [95-108] 95 (01/13 0829) Cardiac Rhythm: Normal sinus rhythm (01/13 0740) Resp:  [22-26] 24 (01/13 0829) BP: (102-112)/(63-68) 112/63 (01/13 0829) SpO2:  [98 %] 98 % (01/13 0829) Weight:  [79.6 kg] 79.6 kg (01/13 0321)  Hemodynamic parameters for last 24 hours:    Intake/Output from previous day: 01/12 0701 - 01/13 0700 In: -  Out: 530 [Urine:500; Drains:30] Intake/Output this shift: Total I/O In: -  Out: 250 [Urine:250]  General appearance: alert, cooperative, and no distress Heart: regular rate and rhythm Lungs: Mildly diminished on the right lower fields Abdomen: Soft nontender Extremities: Warm and well-perfused, minor ankle edema Wound: G-tube site with skin maceration and erythema  Lab Results: Recent Labs    10/29/22 0711  WBC 8.4  HGB 7.2*  HCT 22.6*  PLT 252   BMET:  Recent Labs    10/27/22 1208 10/28/22 0715  NA 132*  --   CREATININE  --  0.71    PT/INR: No results for input(s): "LABPROT", "INR" in the last 72 hours. ABG No results found for: "PHART", "HCO3", "TCO2", "ACIDBASEDEF", "O2SAT" CBG (last 3)  No results for input(s): "GLUCAP" in the last 72 hours.  Meds Scheduled Meds:  Chlorhexidine Gluconate Cloth  6 each Topical Daily   enoxaparin (LOVENOX) injection  40 mg Subcutaneous Q24H   feeding supplement (OSMOLITE 1.5 CAL)  1,000 mL Per Tube Q24H   fiber  1 packet Per Tube BID   free water  100 mL Per Tube 6 X Daily   Gerhardt's butt cream   Topical BID   ketorolac  30 mg Intravenous Q6H   ondansetron (ZOFRAN) IV  4 mg Intravenous QHS    mouth rinse  15 mL Mouth Rinse 4 times per day   pantoprazole (PROTONIX) IV  40 mg Intravenous Q24H   sodium chloride flush  60 mL Per Tube Q6H   Continuous Infusions: PRN Meds:.acetaminophen, fentaNYL (SUBLIMAZE) injection, HYDROcodone-acetaminophen, influenza vac split quadrivalent PF, iohexol, loperamide HCl, ondansetron (ZOFRAN) IV, mouth rinse, mouth rinse, pneumococcal 20-valent conjugate vaccine  Xrays No results found.  Assessment/Plan: S/P Procedure(s) (LRB): ESOPHAGOGASTRODUODENOSCOPY (EGD) WITH PROPOFOL (N/A) ESOPHAGEAL STENT PLACEMENT (N/A) STENT REMOVAL  1 Tmax 99.6, vital signs stable, sinus rhythm/sinus tachycardia 2 oxygen saturations are good on room air 3 drain 30 cc/24H 4 no leukocytosis 5 H/H 7.2/22.6, this is down from 8.2/26.2 on 10/26/2022, continue to monitor as near transfusion threshold-will need to repeat in the next couple days 6 antibiotics discontinued yesterday 7 awaiting appointment at [facility] 8 continues dressing changes around J-tube site, continues J-tube feedings   LOS: 43 days    John Giovanni [facility] Pager [phone] 10/29/2022

## 2022-10-30 NOTE — Progress Notes (Addendum)
[address]       [place] [address]             [phone]     32 Days Post-Op Procedure(s) (LRB): ESOPHAGOGASTRODUODENOSCOPY (EGD) WITH PROPOFOL (N/A) ESOPHAGEAL STENT PLACEMENT (N/A) STENT REMOVAL Subjective: No  new issues  Objective: Vital signs in last 24 hours: Temp:  [98 F (36.7 C)-99.9 F (37.7 C)] 99.3 F (37.4 C) (01/14 0810) Pulse Rate:  [92-103] 101 (01/14 0810) Cardiac Rhythm: Normal sinus rhythm (01/14 0721) Resp:  [17-23] 20 (01/14 0810) BP: (108-119)/(63-72) 112/66 (01/14 0810) SpO2:  [95 %-96 %] 96 % (01/13 2018) Weight:  [79.7 kg] 79.7 kg (01/14 0411)  Hemodynamic parameters for last 24 hours:    Intake/Output from previous day: 01/13 0701 - 01/14 0700 In: 8473.3 [I.V.:60; NG/GT:8413.3] Out: 730 [Urine:700; Drains:30] Intake/Output this shift: No intake/output data recorded.  General appearance: alert, cooperative, and no distress Heart: regular rate and rhythm Lungs: Mildly diminished on right Abdomen: Soft, nontender, nondistended Wound: Maceration in the region of J-tube is stable  Lab Results: Recent Labs    10/29/22 0711  WBC 8.4  HGB 7.2*  HCT 22.6*  PLT 252   BMET:  Recent Labs    10/27/22 1208 10/28/22 0715 10/29/22 1330  NA 132*  --  131*  CREATININE  --  0.71  --     PT/INR: No results for input(s): "LABPROT", "INR" in the last 72 hours. ABG No results found for: "PHART", "HCO3", "TCO2", "ACIDBASEDEF", "O2SAT" CBG (last 3)  No results for input(s): "GLUCAP" in the last 72 hours.  Meds Scheduled Meds:  Chlorhexidine Gluconate Cloth  6 each Topical Daily   enoxaparin (LOVENOX) injection  40 mg Subcutaneous Q24H   feeding supplement (OSMOLITE 1.5 CAL)  1,000 mL Per Tube Q24H   fiber  1 packet Per Tube BID   free water  100 mL Per Tube 6 X Daily   Gerhardt's butt cream   Topical BID   ketorolac  30 mg Intravenous Q6H   ondansetron (ZOFRAN) IV  4 mg Intravenous QHS   mouth rinse  15 mL Mouth  Rinse 4 times per day   pantoprazole (PROTONIX) IV  40 mg Intravenous Q24H   sodium chloride flush  60 mL Per Tube Q6H   Continuous Infusions: PRN Meds:.acetaminophen, fentaNYL (SUBLIMAZE) injection, HYDROcodone-acetaminophen, influenza vac split quadrivalent PF, iohexol, loperamide HCl, ondansetron (ZOFRAN) IV, mouth rinse, mouth rinse, pneumococcal 20-valent conjugate vaccine  Xrays No results found.  Assessment/Plan: S/P Procedure(s) (LRB): ESOPHAGOGASTRODUODENOSCOPY (EGD) WITH PROPOFOL (N/A) ESOPHAGEAL STENT PLACEMENT (N/A) STENT REMOVAL  1 Tmax 99.9, normal sinus rhythm, some sinus tachycardia in the low 100s, otherwise vital signs stable 2 oxygen saturations remain good on room air 3 drains 30 cc/4H 4 no new labs or x-rays 5 no change in current plans as outlined, will repeat H/H in a.m., recheck BMET in am - feels like he's not making as much urine  LOS: 44 days    Odis Luster Pager [phone] 10/30/2022 Agree with above

## 2022-10-30 NOTE — Progress Notes (Signed)
Mobility Specialist Progress Note    10/30/22 1519  Mobility  Activity Ambulated with assistance in hallway  Level of Assistance Standby assist, set-up cues, supervision of patient - no hands on  Assistive Device Four wheel walker  Distance Ambulated (ft) 340 ft  Activity Response Tolerated well  Mobility Referral Yes  $Mobility charge 1 Mobility   Pre-Mobility: 110 HR During Mobility: 155 HR Post-Mobility: 143 HR  Pt received in bed and agreeable. No complaints on walk. Took x3 seated rest breaks. HR into 150s so rolled pt back to room sitting on 4WW. Returned to BR. RN present in room.    Hildred Alamin Mobility Specialist  Please Psychologist, sport and exercise or Rehab Office at [phone]

## 2022-10-31 LAB — BASIC METABOLIC PANEL
Anion gap: 9 (ref 5–15)
BUN: 20 mg/dL (ref 8–23)
CO2: 29 mmol/L (ref 22–32)
Calcium: 9.1 mg/dL (ref 8.9–10.3)
Chloride: 97 mmol/L — ABNORMAL LOW (ref 98–111)
Creatinine, Ser: 0.6 mg/dL — ABNORMAL LOW (ref 0.61–1.24)
GFR, Estimated: >60 mL/min (ref >60)
Glucose, Bld: 164 mg/dL — ABNORMAL HIGH (ref 70–99)
Potassium: 4.3 mmol/L (ref 3.5–5.1)
Sodium: 135 mmol/L (ref 135–145)

## 2022-10-31 LAB — CBC
HCT: 21.4 % — ABNORMAL LOW (ref 39.0–52.0)
Hemoglobin: 6.6 g/dL — CL (ref 13.0–17.0)
MCH: 30.3 pg (ref 26.0–34.0)
MCHC: 30.8 g/dL (ref 30.0–36.0)
MCV: 98.2 fL (ref 80.0–100.0)
Platelets: 266 10*3/uL (ref 150–400)
RBC: 2.18 MIL/uL — ABNORMAL LOW (ref 4.22–5.81)
RDW: 15.2 % (ref 11.5–15.5)
WBC: 7.2 10*3/uL (ref 4.0–10.5)
nRBC: 0 % (ref 0.0–0.2)

## 2022-10-31 LAB — PREPARE RBC (CROSSMATCH)

## 2022-10-31 LAB — SODIUM: Sodium: 134 mmol/L — ABNORMAL LOW (ref 135–145)

## 2022-10-31 MED ORDER — KETOROLAC TROMETHAMINE 30 MG/ML IJ SOLN
30.0000 mg | Freq: Once | INTRAMUSCULAR | Status: AC
  Administered 2022-10-31: 30 mg via INTRAVENOUS
  Filled 2022-10-31: qty 1

## 2022-10-31 NOTE — Progress Notes (Signed)
Mobility Specialist Progress Note   10/31/22 1415  Mobility  Activity Refused mobility (Hgb 6.6 and due for transfusion)   Patient declined due to feeling sluggish, fatigued and reasons stated above. Will continue to follow.  [place] Jaslynn Thome, BS EXP Mobility Specialist Please contact via SecureChat or Rehab office at [phone]

## 2022-10-31 NOTE — TOC Progression Note (Signed)
Transition of Care [facility]) - Progression Note    Patient Details  Name: [name] [name] MRN: [id number] Date of Birth: [birth year]  Transition of Care [facility]) CM/SW Contact  Zenon Mayo, RN Phone Number: 10/31/2022, 3:24 PM  Clinical Narrative:    hgb 6.6, stopped lovenox, transfusing, check stool for bleeding. Awaiting bed at [facility].  TOC following.   Expected Discharge Plan: [place] Barriers to Discharge: Continued Medical Work up  Expected Discharge Plan and Services   Discharge Planning Services: CM Consult Post Acute Care Choice: Durable Medical Equipment, Home Health Living arrangements for the past 2 months: Single Family Home                 DME Arranged: Tube feeding pump, Tube feeding DME Agency: Other - [place]) Date DME Agency Contacted: 10/18/22 Time DME Agency Contacted: 1200 Representative spoke with at DME Agency: [place]: RN, PT, OT [facility] Agency: [place] Date [place]: 10/18/22 Time [place]: 1200 Representative spoke with at [place]: Amy   Social Determinants of Health ([place]) Interventions [place]: No Food Insecurity (09/18/2022)  Housing: Low Risk  (09/25/2022)  Transportation Needs: No Transportation Needs (10/04/2022)  Utilities: Not At Risk (09/30/2022)  Tobacco Use: Low Risk  (10/14/2022)    Readmission Risk Interventions     No data to display

## 2022-10-31 NOTE — Progress Notes (Signed)
[address]       [place] [address]             [phone]    33 Days Post-Op Procedure(s) (LRB): ESOPHAGOGASTRODUODENOSCOPY (EGD) WITH PROPOFOL (N/A) ESOPHAGEAL STENT PLACEMENT (N/A) STENT REMOVAL Subjective: Patient states he is feeling ok today.  Objective: Vital signs in last 24 hours: Temp:  [98.7 F (37.1 C)-99.7 F (37.6 C)] 98.9 F (37.2 C) (01/15 0250) Pulse Rate:  [93-120] 93 (01/15 0250) Cardiac Rhythm: Normal sinus rhythm (01/15 0700) Resp:  [17-29] 22 (01/15 0250) BP: (94-122)/(66-89) 115/89 (01/15 0250) SpO2:  [94 %-97 %] 96 % (01/15 0250) Weight:  [79.2 kg] 79.2 kg (01/15 0250)  Hemodynamic parameters for last 24 hours:    Intake/Output from previous day: 01/14 0701 - 01/15 0700 In: 2751.8 [NG/GT:2751.8] Out: 980 [Urine:950; Drains:30] Intake/Output this shift: No intake/output data recorded.  General appearance: alert, cooperative, and no distress Neurologic: intact Heart: regular rate and rhythm-sinus tachycardia, no murmur Lungs: clear to auscultation bilaterally Abdomen: soft, non-tender; bowel sounds normal; no masses,  no organomegaly Extremities: extremities normal, atraumatic, no cyanosis or edema Wound: Leak around J tube. Clean and dry dressing.  Lab Results: Recent Labs    10/29/22 0711 10/31/22 0300  WBC 8.4 7.2  HGB 7.2* 6.6*  HCT 22.6* 21.4*  PLT 252 266   BMET:  Recent Labs    10/29/22 1330 10/31/22 0300  NA 131* 135  K  --  4.3  CL  --  97*  CO2  --  29  GLUCOSE  --  164*  BUN  --  20  CREATININE  --  0.60*  CALCIUM  --  9.1    PT/INR: No results for input(s): "LABPROT", "INR" in the last 72 hours. ABG No results found for: "PHART", "HCO3", "TCO2", "ACIDBASEDEF", "O2SAT" CBG (last 3)  No results for input(s): "GLUCAP" in the last 72 hours.  Assessment/Plan: S/P Procedure(s) (LRB): ESOPHAGOGASTRODUODENOSCOPY (EGD) WITH PROPOFOL (N/A) ESOPHAGEAL STENT PLACEMENT (N/A) STENT  REMOVAL  Neuro: Pain controlled this AM    CV: Sinus tachycardia, HR 110. Stable hemodynamics, SBP 113.   Pulm: Saturating well on RA. R sided Blake drain x 2, no output recorded. Minimal serosanguinous drainage in bulbs this AM. Cough, sometimes productive, will continue monitor.    GI: Tolerating tube feeds. No nausea and vomiting. Will continue current Zofran and tube feed regimen.    Renal: Good UO. Cr 0.71.   ID: Afebrile, last WBC 9.8. Persistent anastomotic leak, esophogeal/pleural fistulas. Zosyn and Diflucan discontinued. WBC 7.2.  Anemia: H/H 6.6/21.4. Will hold Lovenox for now, will get occult blood. Will discuss transfusion. May need CT abdomen/pelvis.   Wounds: Leaking at J tube site. Local skin irritation and maceration at J tube exit site. Continue dressing changes with Gerhardts cream. Clean and dry dressings at bulb drain sites.    Hyponatremia: Resolved. Na 135, on sodium chloride flushes. Will monitor.   Dispo: Patient is medically stable for discharge and transport to outpatient GI appointment. Due to the complexity of his case and home health needs we will keep him admitted in the hospital until outpatient [place] appointment has been set. Patient states appointment has been set for February 8th, will plan to discharge February 7th. Get occult blood and hold Lovenox. Will discuss CT abdomen/pelvis and transfusion with surgeon.   LOS: 45 days    Magdalene River, PA-C 10/31/2022

## 2022-11-01 LAB — IRON AND TIBC
Iron: 23 ug/dL — ABNORMAL LOW (ref 45–182)
Saturation Ratios: 9 % — ABNORMAL LOW (ref 17.9–39.5)
TIBC: 260 ug/dL (ref 250–450)
UIBC: 237 ug/dL

## 2022-11-01 LAB — BPAM RBC
Blood Product Expiration Date: 202401222359
ISSUE DATE / TIME: 202401151731
Unit Type and Rh: 9500

## 2022-11-01 LAB — FOLATE: Folate: 10.3 ng/mL (ref >5.9)

## 2022-11-01 LAB — CBC
HCT: 24.2 % — ABNORMAL LOW (ref 39.0–52.0)
Hemoglobin: 7.7 g/dL — ABNORMAL LOW (ref 13.0–17.0)
MCH: 30.2 pg (ref 26.0–34.0)
MCHC: 31.8 g/dL (ref 30.0–36.0)
MCV: 94.9 fL (ref 80.0–100.0)
Platelets: 273 10*3/uL (ref 150–400)
RBC: 2.55 MIL/uL — ABNORMAL LOW (ref 4.22–5.81)
RDW: 15.3 % (ref 11.5–15.5)
WBC: 7.2 10*3/uL (ref 4.0–10.5)
nRBC: 0 % (ref 0.0–0.2)

## 2022-11-01 LAB — RETICULOCYTES
Immature Retic Fract: 17.3 % — ABNORMAL HIGH (ref 2.3–15.9)
RBC.: 2.66 MIL/uL — ABNORMAL LOW (ref 4.22–5.81)
Retic Count, Absolute: 61.4 10*3/uL (ref 19.0–186.0)
Retic Ct Pct: 2.3 % (ref 0.4–3.1)

## 2022-11-01 LAB — TYPE AND SCREEN
ABO/RH(D): O NEG
Antibody Screen: NEGATIVE

## 2022-11-01 LAB — VITAMIN B12: Vitamin B-12: 280 pg/mL (ref 180–914)

## 2022-11-01 LAB — FERRITIN: Ferritin: 162 ng/mL (ref 24–336)

## 2022-11-01 MED ORDER — METOCLOPRAMIDE HCL 5 MG/ML IJ SOLN
5.0000 mg | Freq: Four times a day (QID) | INTRAMUSCULAR | Status: DC
  Administered 2022-11-01 – 2022-11-03 (×9): 5 mg via INTRAVENOUS
  Filled 2022-11-01 (×9): qty 2

## 2022-11-01 MED ORDER — NUTRISOURCE FIBER PO PACK
1.0000 | PACK | Freq: Every day | ORAL | Status: DC
  Administered 2022-11-02 – 2022-11-08 (×7): 1
  Filled 2022-11-01 (×7): qty 1

## 2022-11-01 NOTE — Progress Notes (Signed)
[address]       [place] [address]             [phone]      34 Days Post-Op Procedure(s) (LRB): ESOPHAGOGASTRODUODENOSCOPY (EGD) WITH PROPOFOL (N/A) ESOPHAGEAL STENT PLACEMENT (N/A) STENT REMOVAL Subjective: Patient states he feels fine this morning but he had a little vomiting last night.  Objective: Vital signs in last 24 hours: Temp:  [98.5 F (36.9 C)-100 F (37.8 C)] 98.5 F (36.9 C) (01/16 0752) Pulse Rate:  [90-114] 95 (01/16 0752) Cardiac Rhythm: Normal sinus rhythm (01/16 0700) Resp:  [19-25] 24 (01/16 0752) BP: (114-135)/(62-84) 119/78 (01/16 0752) SpO2:  [94 %-97 %] 97 % (01/16 0752) Weight:  [78.4 kg] 78.4 kg (01/16 0433)  Hemodynamic parameters for last 24 hours:    Intake/Output from previous day: 01/15 0701 - 01/16 0700 In: 3468.8 [Blood:388; NG/GT:3080.8] Out: 1682 [Urine:1650; Drains:32] Intake/Output this shift: Total I/O In: -  Out: 300 [Urine:300]  General appearance: alert, cooperative, and no distress Neurologic: intact Heart: regular rate and rhythm-ST, no murmur Lungs: Diminished right sided breath sounds Abdomen: soft, non-tender; bowel sounds normal; no masses,  no organomegaly Extremities: extremities normal, atraumatic, no cyanosis or edema Wound: J tube leak, clean and dry dressings around J tube site and bulb drain sites  Lab Results: Recent Labs    10/31/22 0300 11/01/22 0444  WBC 7.2 7.2  HGB 6.6* 7.7*  HCT 21.4* 24.2*  PLT 266 273   BMET:  Recent Labs    10/31/22 0300 10/31/22 1541  NA 135 134*  K 4.3  --   CL 97*  --   CO2 29  --   GLUCOSE 164*  --   BUN 20  --   CREATININE 0.60*  --   CALCIUM 9.1  --     PT/INR: No results for input(s): "LABPROT", "INR" in the last 72 hours. ABG No results found for: "PHART", "HCO3", "TCO2", "ACIDBASEDEF", "O2SAT" CBG (last 3)  No results for input(s): "GLUCAP" in the last 72 hours.  Assessment/Plan: S/P Procedure(s)  (LRB): ESOPHAGOGASTRODUODENOSCOPY (EGD) WITH PROPOFOL (N/A) ESOPHAGEAL STENT PLACEMENT (N/A) STENT REMOVAL  Neuro: Pain controlled this AM    CV: Sinus rhythm, HR 90s. Stable hemodynamics, SBP 119.   Pulm: Saturating well on RA. R sided Blake drain x 2, 32cc output last shift.     GI: Tolerating tube feeds. Some nausea and a little vomiting last night. Will continue Zofran and tube feed regimen for now. May need to change antiemetic or add reglan if continued N/V with tube feeds.    Renal: States he feels his UO has decreased. UO 1650cc/24 hours. Cr 0.6.   ID: Tmax 100, last WBC 7.2. Persistent anastomotic leak, esophogeal/pleural fistulas. Zosyn and Diflucan discontinued.   Anemia: Required transfusion yesterday. H/H 7.7/24.2 today. Ordered occult blood and d/c'd lovenox. Will get iron studies today.   Wounds: Leaking at J tube site. Local skin irritation and maceration at J tube exit site. Wound care consulted. Continue dressing changes with Gerhardts cream. Clean and dry dressings at bulb drain sites.    Hyponatremia: Na 134, on sodium chloride flushes. Will monitor.   Dispo: Patient is medically stable for discharge and transport to outpatient GI appointment. Due to the complexity of his case and home health needs we will keep him admitted in the hospital until outpatient [place] appointment has been set. Patient states appointment has been set for February 8th, will plan to  discharge February 7th. Will get iron studies today. May need to change antiemetic or add Reglan if continued nausea and vomiting with tube feeds.    LOS: 46 days    Magdalene River, PA-C 11/01/2022

## 2022-11-01 NOTE — Progress Notes (Signed)
Mobility Specialist Progress Note    11/01/22 1442  Mobility  Activity Ambulated with assistance in hallway  Level of Assistance Standby assist, set-up cues, supervision of patient - no hands on  Assistive Device Four wheel walker  Distance Ambulated (ft) 420 ft  Activity Response Tolerated well  Mobility Referral Yes  $Mobility charge 1 Mobility   Pre-Mobility: 96 HR During Mobility: 143 HR Post-Mobility: 124 HR  Pt received in bed and agreeable. No complaints on walk. Took x3 seated rest breaks. Returned to bed with call bell in reach.    Hildred Alamin Mobility Specialist  Please Psychologist, sport and exercise or Rehab Office at [phone]

## 2022-11-01 NOTE — Plan of Care (Signed)
Pt awaits to transfer to [facility] when bed becomes available. SRP,RN

## 2022-11-01 NOTE — Progress Notes (Signed)
Nutrition Follow-up  DOCUMENTATION CODES:   Severe malnutrition in context of chronic illness  INTERVENTION:   Continue 12-INOM cyclic tube feeds via J-tube: - Osmolite 1.5 @ 95 ml/hr x 20 hours daily from 1400 to 1000 (total of 1900 ml) - Free water flushes of 100 ml q 4 hours  Cyclic tube feeding regimen provides 2850 kcal, 119 grams of protein, and 1448 ml of H2O.  Total free water with flushes: 2048 ml  - Continue sodium chloride 0.9% flushes of 60 ml per tube q 6 hours to address chronic hyponatremia  Maintain HOB >30 degrees at all times while TF infusing. Pt should NOT lie flat to sleep at night when TF infusing.  Recommend trial of pro-motility agent such as Reglan or Erythromycin to address nausea/vomiting and possible delayed emptying.   NUTRITION DIAGNOSIS:   Severe Malnutrition related to chronic illness (esophageal cancer) as evidenced by moderate fat depletion, severe muscle depletion, percent weight loss (25.9% weight loss in less than 1 year).  Ongoing, being addressed via TF  GOAL:   Patient will meet greater than or equal to 90% of their needs  Met via TF  MONITOR:   Labs, Weight trends, TF tolerance, Skin  REASON FOR ASSESSMENT:   Consult Enteral/tube feeding initiation and management  ASSESSMENT:   62 y.o. male admits related to SOB and generalized weakness. PMH includes esophageal cancer. Pt is currently receiving medical management for fevers and failure to thrive.  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 Noted pt with leakage around J-tube. WOC has been consulted but unable to evaluate or offer recommendations due to tight sutures around J-tube that are still in place.  Per PA note, pt with episode of vomiting last night. Spoke with pt at bedside. Pt reports that the volume of emesis was very small and not anything like previous episodes of vomiting. Continue to recommend trial of pro-motility agent such as Reglan or Erythromycin to address nausea/vomiting and possible delayed emptying.   RD offered to switch pt back to 24-hour continuous feeds so that tube feeding infusion rate could be lower. Pt declined and would like to continue with 20-hour feeds.  Admit weight: 80.5 kg Current weight: 78.4 kg  Current TF: Osmolite 1.5 @ 95 ml/hr x 20 hours from 1400 to 1000, free water flushes of 100 ml 6 x daily, sodium chloride (NS) 0.9% flush 60 ml QID  Medications reviewed and include: Nutrisource fiber 1 packet BID, IV zofran 4 mg daily at bedtime, IV protonix  Labs reviewed: sodium 134, hemoglobin 7.7  UOP: 1650 ml x 24 hours 19 Fr R chest JP drain: 20 ml x 24 hours RUQ JP drain: 12 ml x 24 hours I/O's: +35.6 L since admit  Diet Order:   Diet Order             Diet NPO time specified  Diet effective now                   EDUCATION NEEDS:   No education needs  have been identified at this time  Skin:  Skin Assessment: Skin Integrity Issues: Incisions: pain and leakage at J-tube site [facility] RN consulted but unable to provide recommendations due to tightness of sutures); chest incisions  Last BM:  10/30/22  Height:   Ht Readings from Last 1 Encounters:  10/12/2022 '5\' 10"'$  (1.778 m)    Weight:   Wt Readings from Last 1 Encounters:  11/01/22 78.4 kg    BMI:  Body mass index is 24.8 kg/m.  Estimated Nutritional Needs:   Kcal:  2400-2700 kcals  Protein:  120-140 grams  Fluid:  >/= 2 L    Gustavus Bryant, MS, RD,  LDN Inpatient Clinical Dietitian Please see AMiON for contact information.

## 2022-11-02 ENCOUNTER — Inpatient Hospital Stay (HOSPITAL_COMMUNITY): Payer: No Typology Code available for payment source

## 2022-11-02 LAB — CBC
HCT: 24.7 % — ABNORMAL LOW (ref 39.0–52.0)
Hemoglobin: 7.9 g/dL — ABNORMAL LOW (ref 13.0–17.0)
MCH: 30.7 pg (ref 26.0–34.0)
MCHC: 32 g/dL (ref 30.0–36.0)
MCV: 96.1 fL (ref 80.0–100.0)
Platelets: 284 10*3/uL (ref 150–400)
RBC: 2.57 MIL/uL — ABNORMAL LOW (ref 4.22–5.81)
RDW: 15.1 % (ref 11.5–15.5)
WBC: 7.3 10*3/uL (ref 4.0–10.5)
nRBC: 0.3 % — ABNORMAL HIGH (ref 0.0–0.2)

## 2022-11-02 LAB — SODIUM: Sodium: 133 mmol/L — ABNORMAL LOW (ref 135–145)

## 2022-11-02 LAB — BASIC METABOLIC PANEL
Anion gap: 8 (ref 5–15)
BUN: 20 mg/dL (ref 8–23)
CO2: 28 mmol/L (ref 22–32)
Calcium: 9.2 mg/dL (ref 8.9–10.3)
Chloride: 98 mmol/L (ref 98–111)
Creatinine, Ser: 0.66 mg/dL (ref 0.61–1.24)
GFR, Estimated: >60 mL/min (ref >60)
Glucose, Bld: 153 mg/dL — ABNORMAL HIGH (ref 70–99)
Potassium: 4.2 mmol/L (ref 3.5–5.1)
Sodium: 134 mmol/L — ABNORMAL LOW (ref 135–145)

## 2022-11-02 LAB — MAGNESIUM: Magnesium: 1.7 mg/dL (ref 1.7–2.4)

## 2022-11-02 LAB — D-DIMER, QUANTITATIVE: D-Dimer, Quant: 1.88 ug/mL-FEU — ABNORMAL HIGH (ref 0.00–0.50)

## 2022-11-02 MED ORDER — IOHEXOL 350 MG/ML SOLN
75.0000 mL | Freq: Once | INTRAVENOUS | Status: AC | PRN
  Administered 2022-11-02: 75 mL via INTRAVENOUS

## 2022-11-02 MED ORDER — SODIUM CHLORIDE 0.9 % IV SOLN
250.0000 mg | Freq: Every day | INTRAVENOUS | Status: AC
  Administered 2022-11-02 – 2022-11-04 (×3): 250 mg via INTRAVENOUS
  Filled 2022-11-02 (×3): qty 20

## 2022-11-02 NOTE — Progress Notes (Signed)
Mobility Specialist Progress Note    11/02/22 1621  Mobility  Activity Contraindicated/medical hold   Pt HR in 130s-140s at rest. RN aware. Will f/u as schedule permits.   Hildred Alamin Mobility Specialist  Please Psychologist, sport and exercise or Rehab Office at [phone]

## 2022-11-02 NOTE — Progress Notes (Signed)
Patient in room with no distress as JP dressings changed. No complaints of pain. HR now in the 120's, all other vitals WNL.

## 2022-11-02 NOTE — Progress Notes (Addendum)
35 Days Post-Op Procedure(s) (LRB): ESOPHAGOGASTRODUODENOSCOPY (EGD) WITH PROPOFOL (N/A) ESOPHAGEAL STENT PLACEMENT (N/A) STENT REMOVAL Subjective: Patient states he feels fine this morning and the nausea and vomiting was "nonexistent" last night.  Objective: Vital signs in last 24 hours: Temp:  [98.3 F (36.8 C)-99 F (37.2 C)] 98.8 F (37.1 C) (01/17 0809) Pulse Rate:  [92-103] 103 (01/17 0809) Cardiac Rhythm: Sinus tachycardia (01/16 1940) Resp:  [19-25] 25 (01/17 0809) BP: (101-115)/(59-70) 114/70 (01/17 0809) SpO2:  [94 %-98 %] 94 % (01/17 0809) Weight:  [78.2 kg] 78.2 kg (01/17 0330)  Hemodynamic parameters for last 24 hours:    Intake/Output from previous day: 01/16 0701 - 01/17 0700 In: 1384.9 [NG/GT:1204.9] Out: 1865 [Urine:1850; Drains:15] Intake/Output this shift: Total I/O In: -  Out: 300 [Urine:300]  General appearance: alert, cooperative, and no distress Neurologic: intact Heart: NSR-ST, no murmur Lungs: Decreased basilar breath sounds Abdomen: soft, non-tender; bowel sounds normal; no masses,  no organomegaly Extremities: extremities normal, atraumatic, no cyanosis or edema Wound: J tube and bulb drains with clean and dry dressings  Lab Results: Recent Labs    11/01/22 0444 11/02/22 0040  WBC 7.2 7.3  HGB 7.7* 7.9*  HCT 24.2* 24.7*  PLT 273 284   BMET:  Recent Labs    10/31/22 0300 10/31/22 1541 11/02/22 0040  NA 135 134* 134*  K 4.3  --  4.2  CL 97*  --  98  CO2 29  --  28  GLUCOSE 164*  --  153*  BUN 20  --  20  CREATININE 0.60*  --  0.66  CALCIUM 9.1  --  9.2    PT/INR: No results for input(s): "LABPROT", "INR" in the last 72 hours. ABG No results found for: "PHART", "HCO3", "TCO2", "ACIDBASEDEF", "O2SAT" CBG (last 3)  No results for input(s): "GLUCAP" in the last 72 hours.  Assessment/Plan: S/P Procedure(s) (LRB): ESOPHAGOGASTRODUODENOSCOPY (EGD) WITH PROPOFOL (N/A) ESOPHAGEAL STENT PLACEMENT (N/A) STENT REMOVAL  Neuro:  Pain controlled this AM    CV: Sinus rhythm-sinus tachycardia, HR 90-120. Stable hemodynamics, SBP 105.   Pulm: Saturating well on RA. R sided Blake drain x 2, 15cc/24 hours. Cough is improving with minimal production.    GI: Tolerating tube feeds. Reglan was added yesterday and patient was without nausea and vomiting.    Renal: UO 1850cc/24 hours. Cr 0.66.   ID: Tmax 99, last WBC 7.3. Persistent anastomotic leak, esophogeal/pleural fistulas. Zosyn and Diflucan discontinued.   Anemia: H/H stable 7.9/24.7 after transfusion Monday. Ordered occult blood and d/c'd lovenox. Pt is iron deficient, may start iron supplement.   Wounds: Leaking at J tube site. Local skin irritation and maceration at J tube exit site. Continue dressing changes with Gerhardts cream. Clean and dry dressings at bulb drain and J tube site this AM.   Hyponatremia: Na 134, on sodium chloride flushes. Will monitor.   Dispo: Patient is medically stable for discharge and transport to outpatient GI appointment. Due to the complexity of his case and home health needs we will keep him admitted in the hospital until outpatient [facility] appointment has been set. Patient states appointment has been set for February 8th, will plan to discharge February 7th. Continue current treatment plan.    LOS: 87 days    Magdalene River, PA-C 11/02/2022 Patient seen and examined, agree with findings and plan outlined above No new issues  Remo Lipps C. Roxan Hockey, MD [facility] [phone]

## 2022-11-02 NOTE — Progress Notes (Signed)
At 1611 notified by tele of patient HR in the 130-140's sinus tach.  Other patient vitals WNL, states he was coughing and that is why his HR was up.  Notified at 1621 that HR was still up by mobility. Patient asymptomatic and stated his pain number is 6 when he is coughing but only when asked if he had pain.  Notified Evonnie Pat PA and he came to beside. Orders received and tylenol given.

## 2022-11-03 LAB — CBC WITH DIFFERENTIAL/PLATELET
Abs Immature Granulocytes: 0.04 10*3/uL (ref 0.00–0.07)
Basophils Absolute: 0 10*3/uL (ref 0.0–0.1)
Basophils Relative: 0 %
Eosinophils Absolute: 0 10*3/uL (ref 0.0–0.5)
Eosinophils Relative: 0 %
HCT: 25.9 % — ABNORMAL LOW (ref 39.0–52.0)
Hemoglobin: 8.1 g/dL — ABNORMAL LOW (ref 13.0–17.0)
Immature Granulocytes: 0 %
Lymphocytes Relative: 2 %
Lymphs Abs: 0.2 10*3/uL — ABNORMAL LOW (ref 0.7–4.0)
MCH: 30.2 pg (ref 26.0–34.0)
MCHC: 31.3 g/dL (ref 30.0–36.0)
MCV: 96.6 fL (ref 80.0–100.0)
Monocytes Absolute: 1.1 10*3/uL — ABNORMAL HIGH (ref 0.1–1.0)
Monocytes Relative: 12 %
Neutro Abs: 7.9 10*3/uL — ABNORMAL HIGH (ref 1.7–7.7)
Neutrophils Relative %: 86 %
Platelets: 308 10*3/uL (ref 150–400)
RBC: 2.68 MIL/uL — ABNORMAL LOW (ref 4.22–5.81)
RDW: 14.8 % (ref 11.5–15.5)
WBC: 9.3 10*3/uL (ref 4.0–10.5)
nRBC: 0 % (ref 0.0–0.2)

## 2022-11-03 MED ORDER — LACTULOSE 10 GM/15ML PO SOLN: 10.0000 g | Freq: Every day | ORAL | Status: DC | PRN

## 2022-11-03 MED ORDER — METOCLOPRAMIDE HCL 5 MG/ML IJ SOLN
10.0000 mg | Freq: Four times a day (QID) | INTRAMUSCULAR | Status: DC
  Administered 2022-11-03 – 2022-11-19 (×63): 10 mg via INTRAVENOUS
  Filled 2022-11-03 (×62): qty 2

## 2022-11-03 NOTE — Progress Notes (Addendum)
[address]       [place] [address]             [phone]      36 Days Post-Op Procedure(s) (LRB): ESOPHAGOGASTRODUODENOSCOPY (EGD) WITH PROPOFOL (N/A) ESOPHAGEAL STENT PLACEMENT (N/A) STENT REMOVAL Subjective: Patient states he is concerned because every time he coughs he feels the production is coming from his stomach.  Objective: Vital signs in last 24 hours: Temp:  [98.8 F (37.1 C)-99.4 F (37.4 C)] 99.1 F (37.3 C) (01/18 0337) Pulse Rate:  [103-135] 123 (01/18 0337) Cardiac Rhythm: Sinus tachycardia (01/17 1954) Resp:  [20-27] 20 (01/18 0738) BP: (102-118)/(60-76) 110/60 (01/18 0337) SpO2:  [94 %-96 %] 94 % (01/18 0337) Weight:  [78.1 kg] 78.1 kg (01/18 0337)  Hemodynamic parameters for last 24 hours:    Intake/Output from previous day: 01/17 0701 - 01/18 0700 In: 2348.9 [NG/GT:2074.2; IV Piggyback:274.7] Out: 2380 [Urine:2270; Drains:110] Intake/Output this shift: No intake/output data recorded.  General appearance: alert, cooperative, and mild distress Neurologic: intact Heart: Sinus tachycardia, no murmur Lungs: Diminished bibasilar breath sounds Abdomen: soft, non-tender; bowel sounds normal; no masses,  no organomegaly Extremities: extremities normal, atraumatic, no cyanosis or edema Wound: J tube with green/brown drainage, dressings saturated. JP drain with brown drainage.   Lab Results: Recent Labs    11/02/22 0040 11/03/22 0400  WBC 7.3 9.3  HGB 7.9* 8.1*  HCT 24.7* 25.9*  PLT 284 308   BMET:  Recent Labs    11/02/22 0040 11/02/22 1330  NA 134* 133*  K 4.2  --   CL 98  --   CO2 28  --   GLUCOSE 153*  --   BUN 20  --   CREATININE 0.66  --   CALCIUM 9.2  --     PT/INR: No results for input(s): "LABPROT", "INR" in the last 72 hours. ABG No results found for: "PHART", "HCO3", "TCO2", "ACIDBASEDEF", "O2SAT" CBG (last 3)  No results for input(s): "GLUCAP" in the last 72 hours.  Assessment/Plan: S/P  Procedure(s) (LRB): ESOPHAGOGASTRODUODENOSCOPY (EGD) WITH PROPOFOL (N/A) ESOPHAGEAL STENT PLACEMENT (N/A) STENT REMOVAL   CV: Sinus tachycardia, HR 110-140. SBP 102-110   Pulm: RR 20-27, with tachycardia.Marland KitchenMarland KitchenSIRS? Saturating well on RA. R sided Blake drain x 2, 15cc/24 hours. Cough is improving with minimal production. CTA without PE, stable ,multilocular right hydropneumothorax and right pleural thickening. Coughing with production every time.    GI: Tolerating tube feeds. Reglan was added yesterday and patient was without nausea and vomiting.    Renal: Good UO   ID: Tmax 99.4, last WBC 9.3. Persistent anastomotic leak, esophogeal/pleural fistulas. Zosyn and Diflucan discontinued.    Anemia: H/H stable 7.9/24.7 after transfusion Monday. Ordered occult blood and d/c'd lovenox. Pt is iron deficient, start IV iron supplement.   Wounds: Leaking at J tube site. Local skin irritation and maceration at J tube exit site. Continue dressing changes with Gerhardts cream.    Hyponatremia: Na 133, on sodium chloride flushes. Will monitor.   Dispo: Patient is medically stable for discharge and transport to outpatient GI appointment. Due to the complexity of his case and home health needs we will keep him admitted in the hospital until outpatient [place] appointment has been set. Patient states appointment has been set for February 8th, will plan to discharge February 7th. Productive cough, SIRS criteria. May need to go back on ABX.     LOS: 48 days    Magdalene River, PA-C  11/03/2022 Having reflux symptoms Afebrile and normal WBC Still mildly tachycardic CT last night showed no evidence of PE  Cleveland Yarbro C. Roxan Hockey, MD [facility] [phone]

## 2022-11-03 NOTE — Progress Notes (Signed)
Dressings changed for g tube and Jp's.

## 2022-11-04 LAB — SODIUM: Sodium: 133 mmol/L — ABNORMAL LOW (ref 135–145)

## 2022-11-04 NOTE — Progress Notes (Signed)
CCMD notified RN of rhythm change.  EKG obtained and placed in chart (Sinus Tachycardia).

## 2022-11-04 NOTE — Progress Notes (Addendum)
[address]       [place] [address]             [phone]      37 Days Post-Op Procedure(s) (LRB): ESOPHAGOGASTRODUODENOSCOPY (EGD) WITH PROPOFOL (N/A) ESOPHAGEAL STENT PLACEMENT (N/A) STENT REMOVAL Subjective: Patient is resting in bed.  Says he is doing OK and has no new concerns.  Objective: Vital signs in last 24 hours: Temp:  [98.4 F (36.9 C)-100.5 F (38.1 C)] 99.1 F (37.3 C) (01/19 0744) Pulse Rate:  [105-143] 112 (01/18 2300) Cardiac Rhythm: Sinus tachycardia (01/19 0700) Resp:  [19-30] 23 (01/19 0744) BP: (97-118)/(61-99) 112/68 (01/19 0744) SpO2:  [97 %-98 %] 98 % (01/19 0744) Weight:  [78.8 kg] 78.8 kg (01/19 0452)    Intake/Output from previous day: 01/18 0701 - 01/19 0700 In: 3269.1 [NG/GT:2994.6; IV Piggyback:274.5] Out: 4034 [Urine:1200; Drains:205] Intake/Output this shift: No intake/output data recorded.  General appearance: alert, cooperative, and no distress Neurologic: intact Heart: Sinus tachycardia Lungs: breath sounds clear, non-labored Abdomen: soft, non-tender Extremities: extremities well perfused without edema Wound: J tube dressing just changed. Surrounding skin is less inflamed.  JP drain with brown drainage.   Lab Results: Recent Labs    11/02/22 0040 11/03/22 0400  WBC 7.3 9.3  HGB 7.9* 8.1*  HCT 24.7* 25.9*  PLT 284 308    BMET:  Recent Labs    11/02/22 0040 11/02/22 1330  NA 134* 133*  K 4.2  --   CL 98  --   CO2 28  --   GLUCOSE 153*  --   BUN 20  --   CREATININE 0.66  --   CALCIUM 9.2  --      PT/INR: No results for input(s): "LABPROT", "INR" in the last 72 hours. ABG No results found for: "PHART", "HCO3", "TCO2", "ACIDBASEDEF", "O2SAT" CBG (last 3)  No results for input(s): "GLUCAP" in the last 72 hours.  Assessment/Plan: S/P Procedure(s) (LRB): ESOPHAGOGASTRODUODENOSCOPY (EGD) WITH PROPOFOL (N/A) ESOPHAGEAL STENT PLACEMENT (N/A) STENT REMOVAL   CV: Sinus tachycardia, HR  105-140. BP stable   Pulm: Saturating well on RA. R sided Blake drain x 2, 205cc/24 hours. Cough is improving with minimal production. CTA 1/17 without PE, stable ,multilocular right hydropneumothorax and right pleural thickening.    GI: Tolerating tube feeds. Nausea improved, continue Reglan   Renal: Good UO   ID: Tmax 99.4, last WBC 9.3. Persistent anastomotic leak, esophogeal/pleural fistulas. Zosyn and Diflucan discontinued.    Anemia: H/H stable 7.9/24.7 after transfusion Monday. Ordered occult blood and d/c'd lovenox. Pt is iron deficient, start IV iron supplement.   Wounds: Leaking at J tube site. Local skin irritation and maceration at J tube exit site. Slowly improving. Continue dressing changes with Gerhardts cream.    Hyponatremia: Na 133, on sodium chloride flushes. Labs in AM. .   Dispo: Patient is medically stable for discharge and transport to outpatient GI appointment. Due to the complexity of his case and home health needs we will keep him admitted in the hospital until outpatient [place] appointment has been set. Patient states appointment has been set for February 8th, will plan to discharge February 7th. Productive cough, SIRS criteria. May need to go back on ABX.     LOS: 49 days    Antony Odea, PA-C 11/04/2022  Patient seen and examined, d/w Mr. Michael Litter, agree with A/P outlined above  Remo Lipps C. Roxan Hockey, MD [facility] [phone]

## 2022-11-04 NOTE — Progress Notes (Signed)
Dressing changes completed by RN.  JP drains dressing saturated with purulent brown drainage.  Edematous area noted around posterior JP drain.  PA notified.  No new orders at this time.

## 2022-11-05 ENCOUNTER — Inpatient Hospital Stay (HOSPITAL_COMMUNITY): Payer: No Typology Code available for payment source

## 2022-11-05 LAB — CBC
HCT: 24.2 % — ABNORMAL LOW (ref 39.0–52.0)
Hemoglobin: 7.8 g/dL — ABNORMAL LOW (ref 13.0–17.0)
MCH: 31 pg (ref 26.0–34.0)
MCHC: 32.2 g/dL (ref 30.0–36.0)
MCV: 96 fL (ref 80.0–100.0)
Platelets: 291 10*3/uL (ref 150–400)
RBC: 2.52 MIL/uL — ABNORMAL LOW (ref 4.22–5.81)
RDW: 14.9 % (ref 11.5–15.5)
WBC: 8 10*3/uL (ref 4.0–10.5)
nRBC: 0 % (ref 0.0–0.2)

## 2022-11-05 LAB — BASIC METABOLIC PANEL
Anion gap: 9 (ref 5–15)
BUN: 18 mg/dL (ref 8–23)
CO2: 28 mmol/L (ref 22–32)
Calcium: 9.2 mg/dL (ref 8.9–10.3)
Chloride: 94 mmol/L — ABNORMAL LOW (ref 98–111)
Creatinine, Ser: 0.63 mg/dL (ref 0.61–1.24)
GFR, Estimated: >60 mL/min (ref >60)
Glucose, Bld: 239 mg/dL — ABNORMAL HIGH (ref 70–99)
Potassium: 3.8 mmol/L (ref 3.5–5.1)
Sodium: 131 mmol/L — ABNORMAL LOW (ref 135–145)

## 2022-11-05 MED ORDER — METOPROLOL TARTRATE 5 MG/5ML IV SOLN: 5.0000 mg | INTRAVENOUS | Status: DC | PRN

## 2022-11-05 MED ORDER — PIPERACILLIN-TAZOBACTAM 3.375 G IVPB
3.3750 g | Freq: Three times a day (TID) | INTRAVENOUS | Status: DC
  Administered 2022-11-05 – 2022-11-07 (×6): 3.375 g via INTRAVENOUS
  Filled 2022-11-05 (×7): qty 50

## 2022-11-05 MED ORDER — HYDROCOD POLI-CHLORPHE POLI ER 10-8 MG/5ML PO SUER
5.0000 mL | Freq: Two times a day (BID) | ORAL | Status: DC | PRN
  Administered 2022-11-05 – 2022-11-18 (×2): 5 mL
  Filled 2022-11-05 (×2): qty 5

## 2022-11-05 MED ORDER — METOPROLOL TARTRATE 5 MG/5ML IV SOLN
5.0000 mg | INTRAVENOUS | Status: DC | PRN
  Administered 2022-11-05 – 2022-11-06 (×3): 5 mg via INTRAVENOUS
  Filled 2022-11-05 (×5): qty 5

## 2022-11-05 MED ORDER — LACTULOSE 10 GM/15ML PO SOLN
10.0000 g | Freq: Every day | ORAL | Status: DC | PRN
  Administered 2022-11-08: 10 g
  Filled 2022-11-05: qty 15

## 2022-11-05 MED ORDER — FLUCONAZOLE 40 MG/ML PO SUSR
400.0000 mg | Freq: Every day | ORAL | Status: DC
  Administered 2022-11-05: 400 mg
  Filled 2022-11-05 (×2): qty 10

## 2022-11-05 NOTE — Significant Event (Addendum)
Rapid Response Event Note   Reason for Call :  SOB, tachycardia/tachypneic, increased oxygen demand(Pt dropped SpO2 to 69%  on RA)  Pt was on RA, SpO2-92-94% prior to this.  He became tachycardia-160s around 2130 for which he was given '5mg'$  metoprolol per MD order. He is now SOB requiring NRB to keep SpO2>90%.  Initial Focused Assessment:  Pt lying in bed with eyes open, in respiratory distress. Breathing is labored and congested. Pt has a frequent congested cough. He is coughing up brown drainage.. Pt c/o CP(which isn't new) and new SOB.  Lungs with rhonchi t/o. Skin hot/diaphoretic. R JP drains x 2 with brown drainage.  T-101.3(Ax.), HR-140s, BP-114/77, RR-30s, SpO2-94% on NRB   Interventions:  NRB EKG-ST PCXR-1. Small to moderate loculated pleural effusion on the right with chest tubes in place. 2. Interstitial and airspace opacities in the right lung and at the left lung base, increased from the prior exam. Tylenol(prn order) Tx to ICU Plan of Care:  Tx to 2H08 for increased monitoring.    Event Summary:   MD Notified: Dr. Cyndia Bent  Call [phone] Arrival [phone] End Time:0020  Dillard Essex, RN

## 2022-11-05 NOTE — Progress Notes (Addendum)
[address]       [place] [address]             [phone]      38 Days Post-Op Procedure(s) (LRB): ESOPHAGOGASTRODUODENOSCOPY (EGD) WITH PROPOFOL (N/A) ESOPHAGEAL STENT PLACEMENT (N/A) STENT REMOVAL  Subjective:  Patient without new complaints.  Continues to have discomfort along right side.  He has not moved his bowels in 4 days.  Objective: Vital signs in last 24 hours: Temp:  [98.4 F (36.9 C)-100.9 F (38.3 C)] 100.9 F (38.3 C) (01/20 0313) Pulse Rate:  [82-131] 131 (01/20 0313) Cardiac Rhythm: Sinus tachycardia (01/19 2000) Resp:  [18-22] 20 (01/20 0313) BP: (100-128)/(61-70) 128/63 (01/20 0313) SpO2:  [92 %-96 %] 96 % (01/20 0313) Weight:  [79.5 kg] 79.5 kg (01/20 0313)  Intake/Output from previous day: 01/19 0701 - 01/20 0700 In: 2578.3 [NG/GT:2408.3] Out: 495 [Urine:350; Drains:145]  General appearance: alert, cooperative, and no distress Heart: regular rate and rhythm and tachy Lungs: clear to auscultation bilaterally Abdomen: soft, non-distended, + BS Wound: incision on right chest is dry, there is a swelling along lower chest tube site, this has improved since my last assessment.. there is foul smelling brown purulence draining around tube  Lab Results: Recent Labs    11/03/22 0400 11/05/22 0544  WBC 9.3 8.0  HGB 8.1* 7.8*  HCT 25.9* 24.2*  PLT 308 291   BMET:  Recent Labs    11/04/22 1439 11/05/22 0544  NA 133* 131*  K  --  3.8  CL  --  94*  CO2  --  28  GLUCOSE  --  239*  BUN  --  18  CREATININE  --  0.63  CALCIUM  --  9.2    PT/INR: No results for input(s): "LABPROT", "INR" in the last 72 hours. ABG No results found for: "PHART", "HCO3", "TCO2", "ACIDBASEDEF", "O2SAT" CBG (last 3)  No results for input(s): "GLUCAP" in the last 72 hours.  Assessment/Plan: S/P Procedure(s) (LRB): ESOPHAGOGASTRODUODENOSCOPY (EGD) WITH PROPOFOL (N/A) ESOPHAGEAL STENT PLACEMENT (N/A) STENT REMOVAL  CV- Sinus Tach- this  has been present, however HR is getting worse Pulm- Blake Drains 145 cc dark brown output Iron Deficiency Anemia- unsure of source, iron studies were low, he has received IV iron, 1 unit of packed cells, awaiting Occult stool result, Hgb stable at 7.8 ID- patient now febrile, no leukocytosis.. I suspect this is from his right chest/side.. ID recommended IV antibiotics through 2/9 at the minimum depending workup at [facility].. this however were discontinued on 1/13 per Dr. Abran Duke instruction.  I think these needs resumed, will discuss with Dr. Cyndia Bent GI- constipation, lactulose has been ordered Dispo- patient with new fevers, increasing Tachycardia, no leukocytosis.Marland Kitchenwill discuss resumption of ABX with Dr. Cyndia Bent, planning for d/c 2/7 for follow up with [facility] on 2/8   LOS: 50 days    Ellwood Handler, PA-C 11/05/2022   Chart reviewed, patient examined, agree with above. Temp to 100.9 early this am. WBC normal. Right chest tube has drainage around it and some swelling of soft tissue. I don't see any abscess in soft tissue on CTA chest from 1/17. I would resume antibiotic as recommended by ID.

## 2022-11-05 NOTE — Progress Notes (Signed)
Pharmacy Antibiotic Note  [name] [name] is a 62 y.o. male admitted on 09/17/2022 with  persistent esophageal leak .  Pharmacy has been consulted for zosyn dosing.  S/p esophagogastroduodenoscopy w/ esophageal stent placement. Was on fluconazole and zosyn 3.375g IV every 8 hours previously (stopped on 1/13) - given new fever will restart antibiotics. Tmax 100.9. WBC 8. Scr 0.63 (CrCl 97 mL/min).   Plan: Zosyn 3.375g IV q8h (4 hour infusion). Fluconazole 400 mg every 24 hours Monitor renal fx, cx results, clinical pic, and for any ID recommendations   Height: '5\' 10"'$  (177.8 cm) Weight: 79.5 kg (175 lb 4.3 oz) (patient refused to stand) IBW/kg (Calculated) : 73  Temp (24hrs), Avg:99.9 F (37.7 C), Min:98.4 F (36.9 C), Max:100.9 F (38.3 C)  Recent Labs  Lab 10/31/22 0300 11/01/22 0444 11/02/22 0040 11/03/22 0400 11/05/22 0544  WBC 7.2 7.2 7.3 9.3 8.0  CREATININE 0.60*  --  0.66  --  0.63    Estimated Creatinine Clearance: 97.6 mL/min (by C-G formula based on SCr of 0.63 mg/dL).    No Known Allergies  Antimicrobials this admission: Augmentin/fluconazole PTA Vancomycin 12/1 >>12/9 Zosyn 12/2 >> 1/13, 1/20>> Fluconazole 12/1>1/13, 1/20>>  Dose adjustments this admission: N/A  Microbiology results: 12/4 Pleural Cx: E Coli (R amp/unasyn, gent, Bactrim)  Thank you for allowing pharmacy to be a part of this patient's care.  Antonietta Jewel, PharmD, [place] Clinical Pharmacist  Phone: [phone] 11/05/2022 11:21 AM  Please check AMION for all [place] phone numbers After 10:00 PM, call [place] [phone]

## 2022-11-06 ENCOUNTER — Inpatient Hospital Stay: Payer: Self-pay

## 2022-11-06 ENCOUNTER — Inpatient Hospital Stay (HOSPITAL_COMMUNITY): Payer: No Typology Code available for payment source

## 2022-11-06 DIAGNOSIS — K2289 Other specified disease of esophagus: Secondary | ICD-10-CM | POA: Diagnosis not present

## 2022-11-06 DIAGNOSIS — K9189 Other postprocedural complications and disorders of digestive system: Secondary | ICD-10-CM | POA: Diagnosis not present

## 2022-11-06 DIAGNOSIS — A419 Sepsis, unspecified organism: Secondary | ICD-10-CM

## 2022-11-06 DIAGNOSIS — J9601 Acute respiratory failure with hypoxia: Secondary | ICD-10-CM | POA: Diagnosis not present

## 2022-11-06 DIAGNOSIS — R6521 Severe sepsis with septic shock: Secondary | ICD-10-CM

## 2022-11-06 DIAGNOSIS — J869 Pyothorax without fistula: Secondary | ICD-10-CM

## 2022-11-06 LAB — CBC
HCT: 30.3 % — ABNORMAL LOW (ref 39.0–52.0)
Hemoglobin: 9.5 g/dL — ABNORMAL LOW (ref 13.0–17.0)
MCH: 30.6 pg (ref 26.0–34.0)
MCHC: 31.4 g/dL (ref 30.0–36.0)
MCV: 97.7 fL (ref 80.0–100.0)
Platelets: 377 10*3/uL (ref 150–400)
RBC: 3.1 MIL/uL — ABNORMAL LOW (ref 4.22–5.81)
RDW: 14.9 % (ref 11.5–15.5)
WBC: 6.5 10*3/uL (ref 4.0–10.5)
nRBC: 0 % (ref 0.0–0.2)

## 2022-11-06 LAB — URINALYSIS, ROUTINE W REFLEX MICROSCOPIC
Bilirubin Urine: NEGATIVE
Glucose, UA: 50 mg/dL — AB
Hgb urine dipstick: NEGATIVE
Ketones, ur: NEGATIVE mg/dL
Leukocytes,Ua: NEGATIVE
Nitrite: NEGATIVE
Protein, ur: 30 mg/dL — AB
Specific Gravity, Urine: 1.027 (ref 1.005–1.030)
pH: 5 (ref 5.0–8.0)

## 2022-11-06 LAB — MRSA NEXT GEN BY PCR, NASAL: MRSA by PCR Next Gen: NOT DETECTED

## 2022-11-06 LAB — ACID FAST CULTURE WITH REFLEXED SENSITIVITIES (MYCOBACTERIA): Acid Fast Culture: NEGATIVE

## 2022-11-06 LAB — LACTIC ACID, PLASMA: Lactic Acid, Venous: 5.2 mmol/L (ref 0.5–1.9)

## 2022-11-06 LAB — POCT I-STAT 7, (LYTES, BLD GAS, ICA,H+H)
Acid-Base Excess: 3 mmol/L — ABNORMAL HIGH (ref 0.0–2.0)
Acid-base deficit: 3 mmol/L — ABNORMAL HIGH (ref 0.0–2.0)
Bicarbonate: 29.6 mmol/L — ABNORMAL HIGH (ref 20.0–28.0)
Bicarbonate: 24.6 mmol/L (ref 20.0–28.0)
Calcium, Ion: 1.39 mmol/L (ref 1.15–1.40)
Calcium, Ion: 1.46 mmol/L — ABNORMAL HIGH (ref 1.15–1.40)
HCT: 29 % — ABNORMAL LOW (ref 39.0–52.0)
HCT: 30 % — ABNORMAL LOW (ref 39.0–52.0)
Hemoglobin: 9.9 g/dL — ABNORMAL LOW (ref 13.0–17.0)
Hemoglobin: 10.2 g/dL — ABNORMAL LOW (ref 13.0–17.0)
O2 Saturation: 91 %
O2 Saturation: 92 %
Patient temperature: 99.5
Patient temperature: 99.2
Potassium: 4.2 mmol/L (ref 3.5–5.1)
Potassium: 4.6 mmol/L (ref 3.5–5.1)
Sodium: 133 mmol/L — ABNORMAL LOW (ref 135–145)
Sodium: 135 mmol/L (ref 135–145)
TCO2: 31 mmol/L (ref 22–32)
TCO2: 26 mmol/L (ref 22–32)
pCO2 arterial: 58.5 mmHg — ABNORMAL HIGH (ref 32–48)
pCO2 arterial: 54.1 mmHg — ABNORMAL HIGH (ref 32–48)
pH, Arterial: 7.314 — ABNORMAL LOW (ref 7.35–7.45)
pH, Arterial: 7.267 — ABNORMAL LOW (ref 7.35–7.45)
pO2, Arterial: 70 mmHg — ABNORMAL LOW (ref 83–108)
pO2, Arterial: 74 mmHg — ABNORMAL LOW (ref 83–108)

## 2022-11-06 LAB — MAGNESIUM: Magnesium: 1.6 mg/dL — ABNORMAL LOW (ref 1.7–2.4)

## 2022-11-06 LAB — BASIC METABOLIC PANEL
Anion gap: 11 (ref 5–15)
BUN: 24 mg/dL — ABNORMAL HIGH (ref 8–23)
CO2: 28 mmol/L (ref 22–32)
Calcium: 9.7 mg/dL (ref 8.9–10.3)
Chloride: 95 mmol/L — ABNORMAL LOW (ref 98–111)
Creatinine, Ser: 0.78 mg/dL (ref 0.61–1.24)
GFR, Estimated: >60 mL/min (ref >60)
Glucose, Bld: 329 mg/dL — ABNORMAL HIGH (ref 70–99)
Potassium: 4.2 mmol/L (ref 3.5–5.1)
Sodium: 134 mmol/L — ABNORMAL LOW (ref 135–145)

## 2022-11-06 LAB — HEMOGLOBIN A1C
Hgb A1c MFr Bld: 4.8 % (ref 4.8–5.6)
Mean Plasma Glucose: 91.06 mg/dL

## 2022-11-06 LAB — GLUCOSE, CAPILLARY
Glucose-Capillary: 362 mg/dL — ABNORMAL HIGH (ref 70–99)
Glucose-Capillary: 297 mg/dL — ABNORMAL HIGH (ref 70–99)
Glucose-Capillary: 223 mg/dL — ABNORMAL HIGH (ref 70–99)
Glucose-Capillary: 343 mg/dL — ABNORMAL HIGH (ref 70–99)

## 2022-11-06 LAB — SODIUM: Sodium: 134 mmol/L — ABNORMAL LOW (ref 135–145)

## 2022-11-06 MED ORDER — MIDAZOLAM HCL 2 MG/2ML IJ SOLN
INTRAMUSCULAR | Status: AC
  Administered 2022-11-06: 2 mg via INTRAVENOUS
  Filled 2022-11-06: qty 2

## 2022-11-06 MED ORDER — NOREPINEPHRINE 4 MG/250ML-% IV SOLN
INTRAVENOUS | Status: AC
  Filled 2022-11-06: qty 250

## 2022-11-06 MED ORDER — DOCUSATE SODIUM 50 MG/5ML PO LIQD
100.0000 mg | Freq: Two times a day (BID) | ORAL | Status: DC
  Administered 2022-11-06 – 2022-11-16 (×19): 100 mg
  Filled 2022-11-06 (×20): qty 10

## 2022-11-06 MED ORDER — NOREPINEPHRINE 4 MG/250ML-% IV SOLN
0.0000 ug/min | INTRAVENOUS | Status: DC
  Administered 2022-11-06: 30 ug/min via INTRAVENOUS
  Administered 2022-11-06: 35 ug/min via INTRAVENOUS
  Administered 2022-11-06: 40 ug/min via INTRAVENOUS
  Administered 2022-11-06: 32 ug/min via INTRAVENOUS
  Filled 2022-11-06 (×4): qty 250

## 2022-11-06 MED ORDER — SODIUM CHLORIDE 0.9% FLUSH: 10.0000 mL | INTRAVENOUS | Status: DC | PRN

## 2022-11-06 MED ORDER — VASOPRESSIN 20 UNITS/100 ML INFUSION FOR SHOCK
0.0000 [IU]/min | INTRAVENOUS | Status: DC
  Administered 2022-11-06 – 2022-11-07 (×5): 0.03 [IU]/min via INTRAVENOUS
  Administered 2022-11-08: 0.04 [IU]/min via INTRAVENOUS
  Filled 2022-11-06 (×6): qty 100

## 2022-11-06 MED ORDER — INSULIN GLARGINE-YFGN 100 UNIT/ML ~~LOC~~ SOLN
10.0000 [IU] | Freq: Every day | SUBCUTANEOUS | Status: DC
  Administered 2022-11-06 – 2022-11-07 (×2): 10 [IU] via SUBCUTANEOUS
  Filled 2022-11-06 (×3): qty 0.1

## 2022-11-06 MED ORDER — FENTANYL BOLUS VIA INFUSION
50.0000 ug | INTRAVENOUS | Status: DC | PRN
  Administered 2022-11-06: 100 ug via INTRAVENOUS
  Administered 2022-11-06: 50 ug via INTRAVENOUS
  Administered 2022-11-06: 100 ug via INTRAVENOUS
  Administered 2022-11-06 (×2): 50 ug via INTRAVENOUS
  Administered 2022-11-07 (×3): 100 ug via INTRAVENOUS
  Administered 2022-11-07: 50 ug via INTRAVENOUS
  Administered 2022-11-07: 100 ug via INTRAVENOUS
  Administered 2022-11-08 – 2022-11-09 (×5): 50 ug via INTRAVENOUS
  Administered 2022-11-09: 100 ug via INTRAVENOUS
  Administered 2022-11-09: 50 ug via INTRAVENOUS
  Administered 2022-11-09 (×2): 100 ug via INTRAVENOUS
  Administered 2022-11-09: 50 ug via INTRAVENOUS
  Administered 2022-11-09 (×2): 100 ug via INTRAVENOUS
  Administered 2022-11-09 (×2): 50 ug via INTRAVENOUS
  Administered 2022-11-09 – 2022-11-10 (×4): 100 ug via INTRAVENOUS
  Administered 2022-11-10: 50 ug via INTRAVENOUS
  Administered 2022-11-10: 100 ug via INTRAVENOUS
  Administered 2022-11-10 – 2022-11-11 (×3): 50 ug via INTRAVENOUS
  Administered 2022-11-11: 100 ug via INTRAVENOUS
  Administered 2022-11-11: 50 ug via INTRAVENOUS
  Administered 2022-11-12: 100 ug via INTRAVENOUS
  Administered 2022-11-12: 50 ug via INTRAVENOUS
  Administered 2022-11-14: 100 ug via INTRAVENOUS
  Administered 2022-11-15: 50 ug via INTRAVENOUS
  Administered 2022-11-15: 25 ug via INTRAVENOUS
  Administered 2022-11-16: 50 ug via INTRAVENOUS
  Administered 2022-11-16: 100 ug via INTRAVENOUS

## 2022-11-06 MED ORDER — ETOMIDATE 2 MG/ML IV SOLN: 20.0000 mg | Freq: Once | INTRAVENOUS | Status: AC

## 2022-11-06 MED ORDER — PROPOFOL 1000 MG/100ML IV EMUL
0.0000 ug/kg/min | INTRAVENOUS | Status: DC
  Administered 2022-11-06: 5 ug/kg/min via INTRAVENOUS
  Filled 2022-11-06 (×2): qty 100

## 2022-11-06 MED ORDER — HYDROCORTISONE SOD SUC (PF) 100 MG IJ SOLR
100.0000 mg | Freq: Two times a day (BID) | INTRAMUSCULAR | Status: DC
  Administered 2022-11-06 – 2022-11-11 (×10): 100 mg via INTRAVENOUS
  Filled 2022-11-06 (×10): qty 2

## 2022-11-06 MED ORDER — HEPARIN SOD (PORK) LOCK FLUSH 100 UNIT/ML IV SOLN
500.0000 [IU] | INTRAVENOUS | Status: DC | PRN
  Administered 2022-11-06: 500 [IU]
  Filled 2022-11-06: qty 5

## 2022-11-06 MED ORDER — HYDROCODONE-ACETAMINOPHEN 5-325 MG PO TABS
1.0000 | ORAL_TABLET | ORAL | Status: DC | PRN
  Administered 2022-11-08 (×3): 1
  Filled 2022-11-06 (×3): qty 1

## 2022-11-06 MED ORDER — PROPOFOL 1000 MG/100ML IV EMUL
INTRAVENOUS | Status: AC
  Administered 2022-11-06: 5 ug/kg/min via INTRAVENOUS
  Filled 2022-11-06: qty 100

## 2022-11-06 MED ORDER — NOREPINEPHRINE 16 MG/250ML-% IV SOLN
0.0000 ug/min | INTRAVENOUS | Status: DC
  Administered 2022-11-06 – 2022-11-07 (×2): 40 ug/min via INTRAVENOUS
  Administered 2022-11-07: 35 ug/min via INTRAVENOUS
  Administered 2022-11-07 (×2): 40 ug/min via INTRAVENOUS
  Administered 2022-11-08: 52 ug/min via INTRAVENOUS
  Administered 2022-11-08: 59 ug/min via INTRAVENOUS
  Administered 2022-11-08: 50 ug/min via INTRAVENOUS
  Administered 2022-11-08: 60 ug/min via INTRAVENOUS
  Administered 2022-11-09: 35 ug/min via INTRAVENOUS
  Administered 2022-11-09: 14 ug/min via INTRAVENOUS
  Administered 2022-11-09: 20 ug/min via INTRAVENOUS
  Administered 2022-11-12: 11 ug/min via INTRAVENOUS
  Administered 2022-11-13: 20 ug/min via INTRAVENOUS
  Administered 2022-11-13: 16 ug/min via INTRAVENOUS
  Administered 2022-11-14: 8 ug/min via INTRAVENOUS
  Administered 2022-11-16: 2 ug/min via INTRAVENOUS
  Filled 2022-11-06 (×18): qty 250

## 2022-11-06 MED ORDER — INSULIN ASPART 100 UNIT/ML IJ SOLN
0.0000 [IU] | INTRAMUSCULAR | Status: DC
  Administered 2022-11-06: 5 [IU] via SUBCUTANEOUS
  Administered 2022-11-06: 11 [IU] via SUBCUTANEOUS
  Administered 2022-11-06 – 2022-11-07 (×4): 8 [IU] via SUBCUTANEOUS
  Administered 2022-11-07: 5 [IU] via SUBCUTANEOUS
  Administered 2022-11-07: 8 [IU] via SUBCUTANEOUS
  Administered 2022-11-08 (×3): 11 [IU] via SUBCUTANEOUS
  Administered 2022-11-08: 8 [IU] via SUBCUTANEOUS

## 2022-11-06 MED ORDER — FENTANYL 2500MCG IN NS 250ML (10MCG/ML) PREMIX INFUSION
0.0000 ug/h | INTRAVENOUS | Status: DC
  Administered 2022-11-06: 50 ug/h via INTRAVENOUS
  Administered 2022-11-06 – 2022-11-07 (×3): 200 ug/h via INTRAVENOUS
  Administered 2022-11-08: 75 ug/h via INTRAVENOUS
  Administered 2022-11-08 – 2022-11-09 (×2): 100 ug/h via INTRAVENOUS
  Administered 2022-11-10 – 2022-11-11 (×3): 200 ug/h via INTRAVENOUS
  Administered 2022-11-11: 150 ug/h via INTRAVENOUS
  Administered 2022-11-11: 200 ug/h via INTRAVENOUS
  Administered 2022-11-12: 175 ug/h via INTRAVENOUS
  Administered 2022-11-12 – 2022-11-13 (×2): 200 ug/h via INTRAVENOUS
  Administered 2022-11-13 – 2022-11-14 (×2): 150 ug/h via INTRAVENOUS
  Administered 2022-11-15: 200 ug/h via INTRAVENOUS
  Administered 2022-11-16: 100 ug/h via INTRAVENOUS
  Filled 2022-11-06 (×19): qty 250

## 2022-11-06 MED ORDER — ORAL CARE MOUTH RINSE: 15.0000 mL | OROMUCOSAL | Status: DC | PRN

## 2022-11-06 MED ORDER — MAGNESIUM SULFATE 4 GM/100ML IV SOLN
4.0000 g | Freq: Once | INTRAVENOUS | Status: AC
  Administered 2022-11-06: 4 g via INTRAVENOUS
  Filled 2022-11-06: qty 100

## 2022-11-06 MED ORDER — MIDAZOLAM HCL 2 MG/2ML IJ SOLN: 2.0000 mg | Freq: Once | INTRAMUSCULAR | Status: AC

## 2022-11-06 MED ORDER — ETOMIDATE 2 MG/ML IV SOLN
INTRAVENOUS | Status: AC
  Administered 2022-11-06: 20 mg via INTRAVENOUS
  Filled 2022-11-06: qty 10

## 2022-11-06 MED ORDER — PROPOFOL 1000 MG/100ML IV EMUL: 5.0000 ug/kg/min | INTRAVENOUS | Status: DC

## 2022-11-06 MED ORDER — NOREPINEPHRINE 4 MG/250ML-% IV SOLN: 845.0000 ug/min | INTRAVENOUS | Status: DC

## 2022-11-06 MED ORDER — POLYETHYLENE GLYCOL 3350 17 G PO PACK
17.0000 g | PACK | Freq: Every day | ORAL | Status: DC
  Administered 2022-11-06 – 2022-11-12 (×7): 17 g
  Filled 2022-11-06 (×7): qty 1

## 2022-11-06 MED ORDER — SODIUM CHLORIDE 0.9% FLUSH: 10.0000 mL | Freq: Two times a day (BID) | INTRAVENOUS | Status: DC

## 2022-11-06 MED ORDER — INSULIN GLARGINE-YFGN 100 UNIT/ML ~~LOC~~ SOLN: 10.0000 [IU] | Freq: Two times a day (BID) | SUBCUTANEOUS | Status: DC

## 2022-11-06 MED ORDER — ORAL CARE MOUTH RINSE
15.0000 mL | OROMUCOSAL | Status: DC
  Administered 2022-11-06 – 2022-11-10 (×48): 15 mL via OROMUCOSAL

## 2022-11-06 MED ORDER — VANCOMYCIN HCL 1250 MG/250ML IV SOLN
1250.0000 mg | Freq: Two times a day (BID) | INTRAVENOUS | Status: DC
  Administered 2022-11-06 – 2022-11-08 (×4): 1250 mg via INTRAVENOUS
  Filled 2022-11-06 (×5): qty 250

## 2022-11-06 MED ORDER — ROCURONIUM BROMIDE 10 MG/ML (PF) SYRINGE
PREFILLED_SYRINGE | INTRAVENOUS | Status: AC
  Administered 2022-11-06: 100 mg via INTRAVENOUS
  Filled 2022-11-06: qty 10

## 2022-11-06 MED ORDER — FLUCONAZOLE IN SODIUM CHLORIDE 400-0.9 MG/200ML-% IV SOLN
400.0000 mg | INTRAVENOUS | Status: DC
  Administered 2022-11-06 – 2022-11-07 (×2): 400 mg via INTRAVENOUS
  Filled 2022-11-06 (×3): qty 200

## 2022-11-06 MED ORDER — HEPARIN SOD (PORK) LOCK FLUSH 100 UNIT/ML IV SOLN
500.0000 [IU] | INTRAVENOUS | Status: DC
  Filled 2022-11-06: qty 5

## 2022-11-06 MED ORDER — ROCURONIUM BROMIDE 50 MG/5ML IV SOLN: 100.0000 mg | Freq: Once | INTRAVENOUS | Status: AC

## 2022-11-06 MED ORDER — HEPARIN SODIUM (PORCINE) 5000 UNIT/ML IJ SOLN
5000.0000 [IU] | Freq: Three times a day (TID) | INTRAMUSCULAR | Status: DC
  Administered 2022-11-06 – 2022-11-19 (×39): 5000 [IU] via SUBCUTANEOUS
  Filled 2022-11-06 (×39): qty 1

## 2022-11-06 NOTE — Progress Notes (Signed)
Patient ID: [name] [name], male   DOB: Mar 26, 1961, 62 y.o.   MRN: [id number]  TCTS Evening Rounds:  He is on vent on 100% FiO2 and 10 PEEP with sats 99%. Weaning per CCM.  Vaso 0.03 and NE 40 to support BP.   BAL GS shows abundant G- rods and few G+ cocci. He is on Zosyn, Vanc and Diflucan.

## 2022-11-06 NOTE — IPAL (Signed)
  Interdisciplinary Goals of Care Family Meeting   Date carried out: 11/06/2022  Location of the meeting: Bedside  Member's involved: Physician, Bedside Registered Nurse, and Family Member or next of kin  Durable Power of Attorney or acting medical decision maker: [name] [name]    Discussion: We discussed goals of care for [name] .  Patient's wife stated that patient was clear regarding his goals of care discussion, he had signed paperwork with his oncologist that he would not want to be resuscitated in case of his heart stops, also he would not wanted to go on ventilator in case of respiratory distress, I explained to patient's wife that when I saw [name] [name] this morning he was in respiratory distress, I explained that to improve respiratory distress, he needs to be on ventilator, he stated that he would like to go on ventilator to treat his acute illness Patient's wife stated that she would not want him to be on ventilator for long period of time, if he improves then we can further discuss his goals of care discussion including going on ventilator, if he does not improve she would want him to be comfortable.  I asked her that lets give him some time and we will discuss goals of care in next few days  Code status:   Code Status: DNR   Disposition: Continue current acute care     [name] Kindle, MD [facility] See Amion for pager If no response to pager, please call [phone] until 7pm After 7pm, Please call E-link [phone]

## 2022-11-06 NOTE — Progress Notes (Signed)
39 Days Post-Op Procedure(s) (LRB): ESOPHAGOGASTRODUODENOSCOPY (EGD) WITH PROPOFOL (N/A) ESOPHAGEAL STENT PLACEMENT (N/A) STENT REMOVAL Subjective:  Transferred to the ICU overnight due to increased work of breathing, increased oxygen requirement and tachycardia to 150's. Spiked temp to 103 this am and CXR showed worsening right lung air space disease. Intubated and bronch by CCM this am showing copious bile colored secretions throughout right lung.   Objective: Vital signs in last 24 hours: Temp:  [99.1 F (37.3 C)-103 F (39.4 C)] 103 F (39.4 C) (01/21 0800) Pulse Rate:  [108-153] 108 (01/21 0850) Cardiac Rhythm: Sinus tachycardia (01/21 0800) Resp:  [20-46] 30 (01/21 0850) BP: (57-125)/(42-80) 57/42 (01/21 0850) SpO2:  [65 %-98 %] 92 % (01/21 0850) FiO2 (%):  [100 %] 100 % (01/21 0850) Weight:  [80.5 kg] 80.5 kg (01/21 0500)  Hemodynamic parameters for last 24 hours:    Intake/Output from previous day: 01/20 0701 - 01/21 0700 In: 2344.9 [I.V.:60; BJ/YN:8295; IV Piggyback:99.9] Out: 1085 [Urine:1000; Drains:85] Intake/Output this shift: Total I/O In: 95 [NG/GT:95] Out: 120 [Urine:100; Drains:20]  General appearance: intubated and sedated, very diaphoretic. Neurologic: unable to assess Heart: regular rate and rhythm, tachy, S1, S2 normal, no murmur Lungs: decreased BS on right, rhonchi bilat. Abdomen: soft, non-distended, bowel sounds present. Extremities: edema mild Right chest drains with brown, foul-smelling drainage.  Lab Results: Recent Labs    11/05/22 0544 11/06/22 0230 11/06/22 0231  WBC 8.0 6.5  --   HGB 7.8* 9.5* 9.9*  HCT 24.2* 30.3* 29.0*  PLT 291 377  --    BMET:  Recent Labs    11/05/22 0544 11/06/22 0230 11/06/22 0231  NA 131* 134* 133*  K 3.8 4.2 4.2  CL 94* 95*  --   CO2 28 28  --   GLUCOSE 239* 329*  --   BUN 18 24*  --   CREATININE 0.63 0.78  --   CALCIUM 9.2 9.7  --     PT/INR: No results for input(s): "LABPROT", "INR" in the  last 72 hours. ABG    Component Value Date/Time   PHART 7.314 (L) 11/06/2022 0231   HCO3 29.6 (H) 11/06/2022 0231   TCO2 31 11/06/2022 0231   O2SAT 91 11/06/2022 0231   CBG (last 3)  Recent Labs    11/06/22 0116  GLUCAP 362*    Assessment/Plan: S/P Procedure(s) (LRB): ESOPHAGOGASTRODUODENOSCOPY (EGD) WITH PROPOFOL (N/A) ESOPHAGEAL STENT PLACEMENT (N/A) STENT REMOVAL  Esophageal leak s/p esophagectomy treated with stent. He continues to have leak with plans to be seen at [facility] for endoscopic clipping. He has been having low grade fevers the past couple days with normal WBC ct. Then spiked to 101.3 last pm and had progressive respiratory decline. Temp 103 this am and CXR shows extensive right lung pneumonia likely due to aspiration. Now intubated on vent. CCM has seen. Continue broad spectrum antibiotics for aspiration pneumonia.  He has been hemodynamically stable so far with normal renal function.  Continue tube feeds for nutrition.     LOS: 51 days    Gaye Pollack 11/06/2022

## 2022-11-06 NOTE — Progress Notes (Signed)
Pharmacy Antibiotic Note  [name] [name] is a 62 y.o. male admitted on 09/18/2022 with  persistent esophageal leak .  Pharmacy has been consulted for zosyn dosing.  S/p esophagogastroduodenoscopy w/ esophageal stent placement. Was on fluconazole and zosyn 3.375g IV every 8 hours previously (stopped on 1/13) - given new fever restarted on antibiotics 1/21. Had continued fever with increased work of breathing - requiring intubation this morning. Additionally, pressor requirements increasing. WBC 6.5, Tmax 103 on acetaminophen. Scr 0.78 (CrCl 97 mL/min).  Plan: Start vancomycin 1250 mg IV every 12 hours (estAUC 505, Vd 0.72) Zosyn 3.375g IV q8h (4 hour infusion). Fluconazole 400 mg every 24 hours Monitor renal fx, cx results, clinical pic, and for any ID recommendations   Height: 5' 10"  (177.8 cm) Weight: 80.5 kg (177 lb 7.5 oz) IBW/kg (Calculated) : 73  Temp (24hrs), Avg:100.5 F (38.1 C), Min:99.1 F (37.3 C), Max:103 F (39.4 C)  Recent Labs  Lab 10/31/22 0300 11/01/22 0444 11/02/22 0040 11/03/22 0400 11/05/22 0544 11/06/22 0230  WBC 7.2 7.2 7.3 9.3 8.0 6.5  CREATININE 0.60*  --  0.66  --  0.63 0.78     Estimated Creatinine Clearance: 97.6 mL/min (by C-G formula based on SCr of 0.78 mg/dL).    No Known Allergies  Antimicrobials this admission: Augmentin/fluconazole PTA Vancomycin 12/1 >>12/9, 1/21>> Zosyn 12/2 >> 1/13, 1/20>> Fluconazole 12/1>1/13, 1/20>>  Dose adjustments this admission: N/A  Microbiology results: 12/4 Pleural Cx: E Coli (R amp/unasyn, gent, Bactrim) 1/21 MRSA PCR: neg  1/21 BAL cx: sent 1/21 Bcx: sent   Thank you for allowing pharmacy to be a part of this patient's care.  Antonietta Jewel, PharmD, BCCCP Clinical Pharmacist  Phone: [phone] 11/06/2022 1:39 PM  Please check AMION for all [place] phone numbers After 10:00 PM, call [place] [phone]

## 2022-11-06 NOTE — Procedures (Signed)
Intubation Procedure Note  [name] [name]  [id number]  [birth year]  Date:11/06/22  Time:9:09 AM   Provider Performing:Avrie Kedzierski    Procedure: Intubation (31500)  Indication(s) Respiratory Failure  Consent Risks of the procedure as well as the alternatives and risks of each were explained to the patient and/or caregiver.  Consent for the procedure was obtained and is signed in the bedside chart   Anesthesia Etomidate and Rocuronium   Time Out Verified patient identification, verified procedure, site/side was marked, verified correct patient position, special equipment/implants available, medications/allergies/relevant history reviewed, required imaging and test results available.   Sterile Technique Usual hand hygeine, masks, and gloves were used   Procedure Description Patient positioned in bed supine.  Sedation given as noted above.  Patient was intubated with endotracheal tube using  MAC 4 .  View was Grade 1 full glottis .  Number of attempts was 1.  Colorimetric CO2 detector was consistent with tracheal placement.   Complications/Tolerance None; patient tolerated the procedure well. Chest X-ray is ordered to verify placement.   EBL Minimal   Specimen(s) None

## 2022-11-06 NOTE — Progress Notes (Signed)
RT assisted MD with intubation and bronchoscopy procedures. No complications. RT will continue to monitor.

## 2022-11-06 NOTE — Procedures (Signed)
Bronchoscopy Procedure Note  KEDRON UNO  [id number]  [birth year]  Date:11/06/22  Time:9:04 AM   Provider Performing:Yuuki Skeens   Procedure(s):  Flexible bronchoscopy with bronchial alveolar lavage (12878) and Initial Therapeutic Aspiration of Tracheobronchial Tree (67672)  Indication(s) Acute respiratory failure  Consent Risks of the procedure as well as the alternatives and risks of each were explained to the patient and/or caregiver.  Consent for the procedure was obtained and is signed in the bedside chart  Anesthesia Etomidate and Rocuronium   Time Out Verified patient identification, verified procedure, site/side was marked, verified correct patient position, special equipment/implants available, medications/allergies/relevant history reviewed, required imaging and test results available.   Sterile Technique Usual hand hygiene, masks, gowns, and gloves were used   Procedure Description Bronchoscope advanced through tracheostomy tube and into airway.  Airways were examined down to subsegmental level with findings noted below.   Following diagnostic evaluation, BAL(s) performed in RLL with normal saline and return of Bile colored  fluid and Therapeutic aspiration performed in RUL/RML/RLL  Findings: Copious amounts of thin bile colored secretions noted throughout right upper, right middle and right lower lobe, suctioned and BAL was performed   Complications/Tolerance None; patient tolerated the procedure well. Chest X-ray is needed post procedure.   EBL Minimal   Specimen(s) BAL

## 2022-11-06 NOTE — Consult Note (Signed)
NAME:  [name] [name], MRN:  [id number], DOB:  [birth year], LOS: 32 ADMISSION DATE:  09/27/2022, CONSULTATION DATE: 11/06/2022 REFERRING MD: Dr. 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  Overnight patient became short of breath, started spiking fever so he was transferred to ICU.  This morning patient was started spiking fever to 103, remained tachycardic and tachypneic, PCCM was called for help evaluation medical management  Pertinent  Medical History   Past Medical History:  Diagnosis Date   Cancer [facility])    Esophageal Cancer   History of radiation therapy    Esophagus- 05/12/22-06/22/22- Dr. Gery Pray   Port-A-Cath in place 05/11/2022     Significant Hospital Events: Including procedures, antibiotic start and stop dates in addition to other pertinent events     Interim History / Subjective:  Consulted  Objective   Blood pressure (!) 57/42, pulse (!) 108, temperature (!) 103 F (39.4 C), temperature source Axillary, resp. rate (!) 30, height '5\' 10"'$  (1.778 m), weight 80.5 kg, SpO2 92 %.    Vent Mode: PRVC FiO2 (%):  [100 %] 100 % Set Rate:  [30 bmp] 30 bmp Vt Set:  [580 mL] 580 mL PEEP:  [5 cmH20] 5 cmH20 Plateau Pressure:  [31 cmH20] 31 cmH20   Intake/Output Summary (Last 24 hours) at 11/06/2022 0910 Last data filed at 11/06/2022 0800 Gross per 24 hour  Intake 2439.87 ml  Output 905 ml  Net 1534.87 ml   Filed Weights   11/04/22 0452 11/05/22 0313 11/06/22 0500  Weight: 78.8 kg 79.5 kg 80.5 kg    Examination:   Physical exam: General: Crtitically ill-appearing male, diaphoretic HEENT: [place]/AT, eyes anicteric.  Moist close membranes Neuro: Alert, awake, following commands Chest: Tachypneic  bilateral basal crackles right more than left no wheezes or rhonchi.  Right-sided chest tube in place Heart: Tachycardic, regular rhythm, no murmurs or gallops Abdomen: Soft, nondistended, bowel sounds present Skin: No rash  Labs and images were reviewed  Resolved Hospital Problem list     Assessment & Plan:  Acute hypoxic/hypercapnic respiratory failure Aspiration pneumonia with ARDS Septic shock due to bilateral multifocal pneumonia Right-sided empyema s/p VATS, currently with chest tube placed Acute septic encephalopathy Patient was struggling on nonrebreather mask, he was tachypneic tachycardic and febrile with increased work of breathing He started retaining pCO2 He was intubated and placed on mechanical ventilation Continue lung protective ventilation VAP prevention bundle in place PAD protocol with propofol and fentanyl Adjust vent setting to clear hypercapnia Follow-up respiratory culture Continue broad-spectrum antibiotics with vancomycin and Zosyn Continue vasopressor support with map goal 65 Currently on Levophed and vasopressin Started on stress dose steroid Chest tube management per TCTS Monitor chest tube output Patient is draining very thick foul-smelling discharge from chest tube Minimize sedation with RASS goal 0/-1  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 Closely monitor serum sodium  Anemia of chronic disease Monitor H&H and transfuse if less than 7  Adenocarcinoma esophagus s/p esophagectomy, adjuvant chemoradiation Anastomotic leak s/p esophageal stent Defer management to TCTS and oncology Patient was supposed to go to [facility] to help with this anastomotic leak  Best Practice (right click and "Reselect all SmartList Selections" daily)   Diet/type: tubefeeds and NPO DVT prophylaxis: prophylactic heparin  GI prophylaxis: PPI Lines: Central line and yes and  it is still needed Foley:  N/A Code Status:  DNR Last date of multidisciplinary goals of care  discussion [1/21: With the patient's wife, please see Ipal note]  Labs   CBC: Recent Labs  Lab 11/01/22 0444 11/02/22 0040 11/03/22 0400 11/05/22 0544 11/06/22 0230 11/06/22 0231  WBC 7.2 7.3 9.3 8.0 6.5  --   NEUTROABS  --   --  7.9*  --   --   --   HGB 7.7* 7.9* 8.1* 7.8* 9.5* 9.9*  HCT 24.2* 24.7* 25.9* 24.2* 30.3* 29.0*  MCV 94.9 96.1 96.6 96.0 97.7  --   PLT 273 284 308 291 377  --     Basic Metabolic Panel: Recent Labs  Lab 10/31/22 0300 10/31/22 1541 11/02/22 0040 11/02/22 1330 11/02/22 1720 11/04/22 1439 11/05/22 0544 11/06/22 0230 11/06/22 0231  NA 135   < > 134* 133*  --  133* 131* 134* 133*  K 4.3  --  4.2  --   --   --  3.8 4.2 4.2  CL 97*  --  98  --   --   --  94* 95*  --   CO2 29  --  28  --   --   --  28 28  --   GLUCOSE 164*  --  153*  --   --   --  239* 329*  --   BUN 20  --  20  --   --   --  18 24*  --   CREATININE 0.60*  --  0.66  --   --   --  0.63 0.78  --   CALCIUM 9.1  --  9.2  --   --   --  9.2 9.7  --   MG  --   --   --   --  1.7  --   --  1.6*  --    < > = values in this interval not displayed.   GFR: Estimated Creatinine Clearance: 97.6 mL/min (by C-G formula based on SCr of 0.78 mg/dL). Recent Labs  Lab 11/02/22 0040 11/03/22 0400 11/05/22 0544 11/06/22 0230  WBC 7.3 9.3 8.0 6.5    Liver Function Tests: No results for input(s): "AST", "ALT", "ALKPHOS", "BILITOT", "PROT", "ALBUMIN" in the last 168 hours. No results for input(s): "LIPASE", "AMYLASE" in the last 168 hours. No results for input(s): "AMMONIA" in the last 168 hours.  ABG    Component Value Date/Time   PHART 7.314 (L) 11/06/2022 0231   PCO2ART 58.5 (H) 11/06/2022 0231   PO2ART 70 (L) 11/06/2022 0231   HCO3 29.6 (H) 11/06/2022 0231   TCO2 31 11/06/2022 0231   O2SAT 91 11/06/2022 0231     Coagulation Profile: No results for input(s): "INR", "PROTIME" in the last 168 hours.  Cardiac Enzymes: No results for input(s): "CKTOTAL", "CKMB", "CKMBINDEX",  "TROPONINI" in the last 168 hours.  HbA1C: No results found for: "HGBA1C"  CBG: Recent Labs  Lab 11/06/22 0116  GLUCAP 362*    Review of Systems:   12 point review of systems significant for complaint mentioned HPI, rest negative  Past Medical History:  He,  has a past medical history of Cancer [facility]), History of radiation therapy, and Port-A-Cath in place (05/11/2022).   Surgical History:   Past Surgical History:  Procedure Laterality Date   BIOPSY  03/25/2022   Procedure: BIOPSY;  Surgeon: Harvel Quale, MD;  [facility];  Service: Gastroenterology;;  GE junction;  BIOPSY  04/25/2022   Procedure: BIOPSY;  Surgeon: Irving Copas., MD;  Location: [facility];  Service: Gastroenterology;;   COLONOSCOPY     COLONOSCOPY WITH PROPOFOL N/A 03/25/2022   Procedure: COLONOSCOPY WITH PROPOFOL;  Surgeon: Harvel Quale, MD;  [facility];  Service: Gastroenterology;  Laterality: N/A;  945 ASA 1   ESOPHAGEAL STENT PLACEMENT N/A 10/13/2022   Procedure: ESOPHAGEAL STENT PLACEMENT;  Surgeon: Rush Landmark Telford Nab., MD;  Location: [place];  Service: Gastroenterology;  Laterality: N/A;   ESOPHAGOGASTRODUODENOSCOPY N/A 08/18/2022   Procedure: ESOPHAGOGASTRODUODENOSCOPY (EGD);  Surgeon: Lajuana Matte, MD;  Location: [facility] OR;  Service: Thoracic;  Laterality: N/A;   ESOPHAGOGASTRODUODENOSCOPY N/A 08/29/2022   Procedure: ESOPHAGOGASTRODUODENOSCOPY (EGD);  Surgeon: Lajuana Matte, MD;  Location: [facility] OR;  Service: Thoracic;  Laterality: N/A;  will need C arm   ESOPHAGOGASTRODUODENOSCOPY N/A 09/26/2022   Procedure: ESOPHAGOGASTRODUODENOSCOPY (EGD);  Surgeon: Lajuana Matte, MD;  Location: [place];  Service: Thoracic;  Laterality: N/A;  will need C arm   ESOPHAGOGASTRODUODENOSCOPY (EGD) WITH PROPOFOL N/A 03/25/2022   Procedure: ESOPHAGOGASTRODUODENOSCOPY (EGD) WITH PROPOFOL;  Surgeon: Harvel Quale, MD;  [facility];  Service: Gastroenterology;  Laterality: N/A;   ESOPHAGOGASTRODUODENOSCOPY (EGD) WITH PROPOFOL N/A 04/25/2022   Procedure: ESOPHAGOGASTRODUODENOSCOPY (EGD) WITH PROPOFOL;  Surgeon: Rush Landmark Telford Nab., MD;  Location: [facility];  Service: Gastroenterology;  Laterality: N/A;   ESOPHAGOGASTRODUODENOSCOPY (EGD) WITH PROPOFOL N/A 09/23/2022   Procedure: ESOPHAGOGASTRODUODENOSCOPY (EGD) WITH PROPOFOL;  Surgeon: Rush Landmark Telford Nab., MD;  Location: [place];  Service: Gastroenterology;  Laterality: N/A;  with probable esophageal stent removal and replacement with covered esophageal stent   INTERCOSTAL NERVE BLOCK Right 08/18/2022   Procedure: INTERCOSTAL NERVE BLOCK;  Surgeon: Lajuana Matte, MD;  Location: [place];  Service: Thoracic;  Laterality: Right;   IR IMAGING GUIDED PORT INSERTION  05/04/2022   LIPOMA EXCISION     years ago   STENT REMOVAL  09/24/2022   Procedure: STENT REMOVAL;  Surgeon: Irving Copas., MD;  Location: [place];  Service: Gastroenterology;;   UPPER ESOPHAGEAL ENDOSCOPIC ULTRASOUND (EUS) N/A 04/25/2022   Procedure: UPPER ESOPHAGEAL ENDOSCOPIC ULTRASOUND (EUS);  Surgeon: Irving Copas., MD;  Location: [facility];  Service: Gastroenterology;  Laterality: N/A;   VIDEO ASSISTED THORACOSCOPY Right 08/29/2022   Procedure: VIDEO ASSISTED THORACOSCOPY;  Surgeon: Lajuana Matte, MD;  Location: [place];  Service: Thoracic;  Laterality: Right;   VIDEO ASSISTED THORACOSCOPY Right 09/18/2022   Procedure: VIDEO ASSISTED THORACOSCOPY;  Surgeon: Lajuana Matte, MD;  Location: [place];  Service: Thoracic;  Laterality: Right;     Social History:   reports that he has never smoked. He has been exposed to tobacco smoke. He has never used smokeless tobacco. He reports current alcohol use. He reports that he does not use drugs.   Family History:  His family history includes Cancer in his maternal grandmother and another family member.    Allergies No Known Allergies   Home Medications  Prior to Admission medications   Medication Sig Start Date End Date Taking? Authorizing Provider  acetaminophen (TYLENOL) 160 MG/5ML solution Place 20.3 mLs (650 mg total) into feeding tube every 8 (eight) hours as needed for mild pain. 08/25/22  Yes Lars Pinks M, PA-C  fluconazole (DIFLUCAN) 40 MG/ML suspension Place 10 mLs (400 mg total) into feeding tube daily. 09/07/22  Yes Barrett, Erin R, PA-C  Nutritional Supplements (FEEDING SUPPLEMENT, OSMOLITE 1.5 CAL,) LIQD Place 90 mL/hr into feeding tube  daily. For 18 hours each day. 09/06/22  Yes Barrett, Erin R, PA-C  Protein (FEEDING SUPPLEMENT, PROSOURCE TF20,) liquid Place 60 mLs into feeding tube daily. Patient taking differently: Place 60 mLs into feeding tube daily. Comes in 45 ml packets, givng one packet daily for now per wife, between tube feeds 08/25/22  Yes Lars Pinks M, PA-C  sodium chloride flush (NS) 0.9 % SOLN Place 30 mLs into feeding tube every 8 (eight) hours. Patient taking differently: Place 30 mLs into feeding tube every 8 (eight) hours. Also doing 1200 ml purified water d/t dehydration per wife. 09/06/22  Yes Barrett, Erin R, PA-C  fiber (NUTRISOURCE FIBER) PACK packet Place 1 packet into feeding tube 2 (two) times daily. 09/30/22   Nani Skillern, PA-C  HYDROcodone-acetaminophen (HYCET) 7.5-325 mg/15 ml solution Place 10 mLs into feeding tube every 4 (four) hours as needed for moderate pain. 09/30/22   Nani Skillern, PA-C  loperamide HCl (IMODIUM) 1 MG/7.5ML suspension Place 15 mLs (2 mg total) into feeding tube as needed for diarrhea or loose stools. 09/30/22   Nani Skillern, PA-C  Nutritional Supplements (FEEDING SUPPLEMENT, OSMOLITE 1.5 CAL,) LIQD Place 1,000 mLs into feeding tube continuous. 09/30/22   Nani Skillern, PA-C  ondansetron (ZOFRAN) 4 MG/2ML SOLN injection Inject 2 mLs (4 mg total) into the vein every 6 (six) hours  as needed for nausea. 09/30/22   Nani Skillern, PA-C  pantoprazole (PROTONIX) 40 MG injection Inject 40 mg into the vein daily. 09/30/22   Nani Skillern, PA-C  Protein (FEEDING SUPPLEMENT, PROSOURCE TF20,) liquid Place 60 mLs into feeding tube 2 (two) times daily. 10/06/22   Nani Skillern, PA-C  Water For Irrigation, Sterile (FREE WATER) SOLN Place 150 mLs into feeding tube every 6 (six) hours. 10/04/22   Lajuana Matte, MD     Critical care time:     This patient is critically ill with multiple organ system failure which requires frequent high complexity decision making, assessment, support, evaluation, and titration of therapies. This was completed through the application of advanced monitoring technologies and extensive interpretation of multiple databases.  During this encounter critical care time was devoted to patient care services described in this note for 52 minutes.    Jacky Kindle, MD [facility] See Amion for pager If no response to pager, please call [phone] until 7pm After 7pm, Please call E-link [phone]

## 2022-11-06 NOTE — Progress Notes (Addendum)
Peripherally Inserted Central Catheter Placement  The IV Nurse has discussed with the patient and/or persons authorized to consent for the patient, the purpose of this procedure and the potential benefits and risks involved with this procedure.  The benefits include less needle sticks, lab draws from the catheter, and the patient may be discharged home with the catheter. Risks include, but not limited to, infection, bleeding, blood clot (thrombus formation), and puncture of an artery; nerve damage and irregular heartbeat and possibility to perform a PICC exchange if needed/ordered by physician.  Alternatives to this procedure were also discussed.  Bard Power PICC patient education guide, fact sheet on infection prevention and patient information card has been provided to patient /or left at bedside.    PICC Placement Documentation  PICC Triple Lumen 11/06/22 Left Brachial 45 cm 1 cm (Active)  Indication for Insertion or Continuance of Line Vasoactive infusions 11/06/22 1643  Exposed Catheter (cm) 1 cm 11/06/22 1643  Site Assessment Clean, Dry, Intact 11/06/22 1643  Lumen #1 Status Saline locked;Blood return noted 11/06/22 1643  Lumen #2 Status Saline locked;Blood return noted 11/06/22 1643  Lumen #3 Status Saline locked;Blood return noted 11/06/22 1643  Dressing Type Transparent;Securing device 11/06/22 1643  Dressing Status Antimicrobial disc in place;Clean, Dry, Intact 11/06/22 1643  Safety Lock Not Applicable 35/36/14 4315  Line Care Connections checked and tightened 11/06/22 1643  Line Adjustment (NICU/IV Team Only) No 11/06/22 1643  Dressing Intervention New dressing 11/06/22 1643  Dressing Change Due 11/13/22 11/06/22 1643    L AC area with dependent appearing edema upon assessment prior to PICC insertion.   [name] [name] 11/06/2022, 4:44 PM

## 2022-11-06 NOTE — Progress Notes (Signed)
RT obtained ABG and reported results to MD. Results as follows: 7.26/54.1/74/24.6. RT increased RR form 30 to 35 and PEEP from 5 to 10 per MD. RT will continue to monitor.

## 2022-11-07 ENCOUNTER — Inpatient Hospital Stay (HOSPITAL_COMMUNITY): Payer: No Typology Code available for payment source

## 2022-11-07 DIAGNOSIS — J9602 Acute respiratory failure with hypercapnia: Secondary | ICD-10-CM

## 2022-11-07 DIAGNOSIS — J9601 Acute respiratory failure with hypoxia: Secondary | ICD-10-CM | POA: Diagnosis not present

## 2022-11-07 DIAGNOSIS — J869 Pyothorax without fistula: Secondary | ICD-10-CM | POA: Diagnosis not present

## 2022-11-07 DIAGNOSIS — K9189 Other postprocedural complications and disorders of digestive system: Secondary | ICD-10-CM | POA: Diagnosis not present

## 2022-11-07 DIAGNOSIS — K2289 Other specified disease of esophagus: Secondary | ICD-10-CM | POA: Diagnosis not present

## 2022-11-07 LAB — POCT I-STAT 7, (LYTES, BLD GAS, ICA,H+H)
Acid-Base Excess: 0 mmol/L (ref 0.0–2.0)
Bicarbonate: 26.8 mmol/L (ref 20.0–28.0)
Calcium, Ion: 1.41 mmol/L — ABNORMAL HIGH (ref 1.15–1.40)
HCT: 26 % — ABNORMAL LOW (ref 39.0–52.0)
Hemoglobin: 8.8 g/dL — ABNORMAL LOW (ref 13.0–17.0)
O2 Saturation: 92 %
Patient temperature: 39
Potassium: 5.2 mmol/L — ABNORMAL HIGH (ref 3.5–5.1)
Sodium: 134 mmol/L — ABNORMAL LOW (ref 135–145)
TCO2: 28 mmol/L (ref 22–32)
pCO2 arterial: 61.6 mmHg — ABNORMAL HIGH (ref 32–48)
pH, Arterial: 7.256 — ABNORMAL LOW (ref 7.35–7.45)
pO2, Arterial: 84 mmHg (ref 83–108)

## 2022-11-07 LAB — FUNGUS CULTURE WITH STAIN

## 2022-11-07 LAB — FUNGAL ORGANISM REFLEX

## 2022-11-07 LAB — GLUCOSE, CAPILLARY
Glucose-Capillary: 112 mg/dL — ABNORMAL HIGH (ref 70–99)
Glucose-Capillary: 250 mg/dL — ABNORMAL HIGH (ref 70–99)
Glucose-Capillary: 251 mg/dL — ABNORMAL HIGH (ref 70–99)
Glucose-Capillary: 260 mg/dL — ABNORMAL HIGH (ref 70–99)
Glucose-Capillary: 268 mg/dL — ABNORMAL HIGH (ref 70–99)
Glucose-Capillary: 274 mg/dL — ABNORMAL HIGH (ref 70–99)

## 2022-11-07 LAB — BASIC METABOLIC PANEL
Anion gap: 7 (ref 5–15)
BUN: 46 mg/dL — ABNORMAL HIGH (ref 8–23)
CO2: 26 mmol/L (ref 22–32)
Calcium: 9 mg/dL (ref 8.9–10.3)
Chloride: 99 mmol/L (ref 98–111)
Creatinine, Ser: 1.34 mg/dL — ABNORMAL HIGH (ref 0.61–1.24)
GFR, Estimated: 60 mL/min — ABNORMAL LOW (ref >60)
Glucose, Bld: 240 mg/dL — ABNORMAL HIGH (ref 70–99)
Potassium: 4.8 mmol/L (ref 3.5–5.1)
Sodium: 132 mmol/L — ABNORMAL LOW (ref 135–145)

## 2022-11-07 LAB — FUNGUS CULTURE RESULT

## 2022-11-07 LAB — MAGNESIUM: Magnesium: 2.4 mg/dL (ref 1.7–2.4)

## 2022-11-07 LAB — LACTIC ACID, PLASMA
Lactic Acid, Venous: 5.5 mmol/L (ref 0.5–1.9)
Lactic Acid, Venous: 5.5 mmol/L (ref 0.5–1.9)
Lactic Acid, Venous: 5 mmol/L (ref 0.5–1.9)

## 2022-11-07 MED ORDER — DEXMEDETOMIDINE HCL IN NACL 400 MCG/100ML IV SOLN
0.0000 ug/kg/h | INTRAVENOUS | Status: DC
  Administered 2022-11-07: 1.2 ug/kg/h via INTRAVENOUS
  Administered 2022-11-07: 0.4 ug/kg/h via INTRAVENOUS
  Administered 2022-11-07: 1.2 ug/kg/h via INTRAVENOUS
  Administered 2022-11-07: 1.1 ug/kg/h via INTRAVENOUS
  Administered 2022-11-08 (×2): 1.2 ug/kg/h via INTRAVENOUS
  Administered 2022-11-08: 1 ug/kg/h via INTRAVENOUS
  Administered 2022-11-08: 1.2 ug/kg/h via INTRAVENOUS
  Administered 2022-11-08 (×2): 1.1 ug/kg/h via INTRAVENOUS
  Administered 2022-11-09 – 2022-11-10 (×10): 1.2 ug/kg/h via INTRAVENOUS
  Administered 2022-11-10 – 2022-11-11 (×2): 1 ug/kg/h via INTRAVENOUS
  Administered 2022-11-11: 0.5 ug/kg/h via INTRAVENOUS
  Administered 2022-11-11 (×2): 1 ug/kg/h via INTRAVENOUS
  Administered 2022-11-13: 0.1 ug/kg/h via INTRAVENOUS
  Filled 2022-11-07 (×26): qty 100

## 2022-11-07 MED ORDER — OSMOLITE 1.5 CAL PO LIQD
1000.0000 mL | ORAL | Status: DC
  Administered 2022-11-07 – 2022-11-08 (×2): 1000 mL

## 2022-11-07 MED ORDER — SODIUM BICARBONATE 8.4 % IV SOLN
100.0000 meq | Freq: Once | INTRAVENOUS | Status: AC
  Administered 2022-11-07: 100 meq via INTRAVENOUS
  Filled 2022-11-07: qty 100

## 2022-11-07 MED ORDER — SODIUM CHLORIDE 0.9 % IV SOLN
1.0000 g | Freq: Two times a day (BID) | INTRAVENOUS | Status: DC
  Administered 2022-11-07 – 2022-11-08 (×2): 1 g via INTRAVENOUS
  Filled 2022-11-07 (×2): qty 20

## 2022-11-07 MED ORDER — CALCIUM GLUCONATE-NACL 1-0.675 GM/50ML-% IV SOLN
1.0000 g | Freq: Once | INTRAVENOUS | Status: AC
  Administered 2022-11-07: 1000 mg via INTRAVENOUS
  Filled 2022-11-07: qty 50

## 2022-11-07 NOTE — Progress Notes (Signed)
NAME:  DETAVIOUS [name], MRN:  [id number], DOB:  1960-10-21, LOS: 40 ADMISSION DATE:  10/11/2022, CONSULTATION DATE: 11/06/2022 REFERRING MD: Dr. 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  Overnight patient became short of breath, started spiking fever so he was transferred to ICU.  This morning patient was started spiking fever to 103, remained tachycardic and tachypneic, PCCM was called for help evaluation medical management  Pertinent  Medical History   Past Medical History:  Diagnosis Date   Cancer [facility])    Esophageal Cancer   History of radiation therapy    Esophagus- 05/12/22-06/22/22- Dr. Gery Pray   Port-A-Cath in place 05/11/2022     Significant Hospital Events: Including procedures, antibiotic start and stop dates in addition to other pertinent events     Interim History / Subjective:  Patient continued to spike fever with Tmax 102.7 Remained tachycardic and in respiratory distress Continue to require high-dose vasopressor support Objective   Blood pressure 97/71, pulse (!) 142, temperature (!) 102.7 F (39.3 C), temperature source Axillary, resp. rate 20, height '5\' 10"'$  (1.778 m), weight 81.6 kg, SpO2 92 %. CVP:  [5 mmHg] 5 mmHg  Vent Mode: PRVC FiO2 (%):  [80 %-100 %] 100 % Set Rate:  [35 bmp] 35 bmp Vt Set:  [580 mL] 580 mL PEEP:  [10 cmH20] 10 cmH20 Plateau Pressure:  [31 cmH20-38 cmH20] 38 cmH20   Intake/Output Summary (Last 24 hours) at 11/07/2022 1632 Last data filed at 11/07/2022 1400 Gross per 24 hour  Intake 4371.99 ml  Output 1060 ml  Net 3311.99 ml   Filed Weights   11/05/22 0313 11/06/22 0500 11/07/22 0449  Weight: 79.5 kg 80.5 kg 81.6 kg    Examination:   Physical  exam: General: Crtitically ill-appearing male, orally intubated, in respiratory distress HEENT: [place]/AT, eyes anicteric.  ETT and OGT in place Neuro: Opens eyes with vocal stimuli, not following commands, agitated and restless Chest: Tachypneic bilateral basal crackles, no wheezes or rhonchi.  Right-sided chest tube in place with dark brown output Heart: Tachycardic, no murmurs or gallops Abdomen: Soft, nondistended, bowel sounds present Skin: No rash  Labs and images were reviewed  Resolved Hospital Problem list     Assessment & Plan:  Acute hypoxic/hypercapnic respiratory failure Aspiration pneumonia with ARDS Septic shock due to bilateral multifocal pneumonia Right-sided empyema s/p VATS, currently with chest tube placed Acute septic encephalopathy Patient remained on full support mechanical ventilation Currently on 100% FiO2 and PEEP of 10 Continue lung protective ventilation VAP prevention bundle in place PAD protocol with Precedex and fentanyl with RASS goal -1 Respiratory culture is growing gram-negative rods Continue vancomycin, switch Zosyn to meropenem He continued to spike fever Looks uncomfortable on mechanical ventilator Currently on Levophed at 40 and vasopressin Continue stress dose steroids with hydrocortisone 100 mg every 12h With propofol he drops his blood pressure significantly He continued to have foul-smelling brownish output from right-sided chest tube, management per  TCTS   Hyponatremia Acute kidney injury likely due to septic ATN Likely due to SIADH Closely monitor serum sodium Remains at 134 Patient baseline serum creatinine is around 0.7, trended up to 1.4 Avoid nephrotoxic agent Monitor intake and output  Anemia of chronic disease Monitor H&H and transfuse if less than 7  Adenocarcinoma esophagus s/p esophagectomy,  adjuvant chemoradiation Anastomotic leak s/p esophageal stent Defer management to TCTS and oncology Patient was supposed to go  to [facility] to help with this anastomotic leak  Hyperglycemia Patient's hemoglobin A1c is 4.3 He is on 95 cc/h tube feeds and also on stress dose steroid Continue with Semglee and sliding scale insulin with CBG goal 1 40-20  Best Practice (right click and "Reselect all SmartList Selections" daily)   Diet/type: tubefeeds and NPO DVT prophylaxis: prophylactic heparin  GI prophylaxis: PPI Lines: Central line and yes and it is still needed Foley:  N/A Code Status:  DNR Last date of multidisciplinary goals of care discussion [1/21: With the patient's wife, please see Ipal note]  Labs   CBC: Recent Labs  Lab 11/01/22 0444 11/02/22 0040 11/03/22 0400 11/05/22 0544 11/06/22 0230 11/06/22 0231 11/06/22 1119 11/07/22 0440  WBC 7.2 7.3 9.3 8.0 6.5  --   --   --   NEUTROABS  --   --  7.9*  --   --   --   --   --   HGB 7.7* 7.9* 8.1* 7.8* 9.5* 9.9* 10.2* 8.8*  HCT 24.2* 24.7* 25.9* 24.2* 30.3* 29.0* 30.0* 26.0*  MCV 94.9 96.1 96.6 96.0 97.7  --   --   --   PLT 273 284 308 291 377  --   --   --     Basic Metabolic Panel: Recent Labs  Lab 11/02/22 0040 11/02/22 1330 11/02/22 1720 11/04/22 1439 11/05/22 0544 11/06/22 0230 11/06/22 0231 11/06/22 1119 11/06/22 1719 11/07/22 0410 11/07/22 0440  NA 134*   < >  --    < > 131* 134* 133* 135 134* 132* 134*  K 4.2  --   --   --  3.8 4.2 4.2 4.6  --  4.8 5.2*  CL 98  --   --   --  94* 95*  --   --   --  99  --   CO2 28  --   --   --  28 28  --   --   --  26  --   GLUCOSE 153*  --   --   --  239* 329*  --   --   --  240*  --   BUN 20  --   --   --  18 24*  --   --   --  46*  --   CREATININE 0.66  --   --   --  0.63 0.78  --   --   --  1.34*  --   CALCIUM 9.2  --   --   --  9.2 9.7  --   --   --  9.0  --   MG  --   --  1.7  --   --  1.6*  --   --   --  2.4  --    < > = values in this interval not displayed.   GFR: Estimated Creatinine Clearance: 58.3 mL/min (A) (by C-G formula based on SCr of 1.34 mg/dL (H)). Recent Labs  Lab  11/02/22 0040 11/03/22 0400 11/05/22 0544 11/06/22 0230 11/06/22 2023 11/07/22 1155  WBC 7.3 9.3 8.0 6.5  --   --   LATICACIDVEN  --   --   --   --  5.5*  5.2* 5.5*    Liver Function Tests: No results for input(s): "AST", "ALT", "ALKPHOS", "BILITOT", "PROT", "ALBUMIN" in the last 168 hours. No results  for input(s): "LIPASE", "AMYLASE" in the last 168 hours. No results for input(s): "AMMONIA" in the last 168 hours.  ABG    Component Value Date/Time   PHART 7.256 (L) 11/07/2022 0440   PCO2ART 61.6 (H) 11/07/2022 0440   PO2ART 84 11/07/2022 0440   HCO3 26.8 11/07/2022 0440   TCO2 28 11/07/2022 0440   ACIDBASEDEF 3.0 (H) 11/06/2022 1119   O2SAT 92 11/07/2022 0440     Coagulation Profile: No results for input(s): "INR", "PROTIME" in the last 168 hours.  Cardiac Enzymes: No results for input(s): "CKTOTAL", "CKMB", "CKMBINDEX", "TROPONINI" in the last 168 hours.  HbA1C: Hgb A1c MFr Bld  Date/Time Value Ref Range Status  11/06/2022 10:07 AM 4.8 4.8 - 5.6 % Final    Comment:    (NOTE) Pre diabetes:          5.7%-6.4%  Diabetes:              >6.4%  Glycemic control for   <7.0% adults with diabetes     CBG: Recent Labs  Lab 11/07/22 0007 11/07/22 0422 11/07/22 0858 11/07/22 1141 11/07/22 1551  GLUCAP 268* 250* 274* 260* 112*    Critical care time:     This patient is critically ill with multiple organ system failure which requires frequent high complexity decision making, assessment, support, evaluation, and titration of therapies. This was completed through the application of advanced monitoring technologies and extensive interpretation of multiple databases.  During this encounter critical care time was devoted to patient care services described in this note for 44 minutes.    Jacky Kindle, MD [place] [facility] See Amion for pager If no response to pager, please call [phone] until 7pm After 7pm, Please call E-link [phone]

## 2022-11-07 NOTE — Progress Notes (Signed)
[address]       [place] [address]             [phone]      Intubated, sedated  BP 95/62   Pulse (!) 124   Temp 98.5 F (36.9 C) (Oral)   Resp (!) 28   Ht '5\' 10"'$  (1.778 m)   Wt 81.6 kg   SpO2 98%   BMI 25.81 kg/m  Vasopressin 0.03, norepi 40   Intake/Output Summary (Last 24 hours) at 11/07/2022 2150 Last data filed at 11/07/2022 2100 Gross per 24 hour  Intake 4574.36 ml  Output 1125 ml  Net 3449.36 ml   CBG elevated Lactic acid 5.0 down slightly from 5.5  Continue current Rx  [name] [name] C. Roxan Hockey, MD [facility] [phone]

## 2022-11-07 NOTE — Progress Notes (Signed)
Nutrition Follow-up  DOCUMENTATION CODES:   Severe malnutrition in context of chronic illness  INTERVENTION:   Transition back to continuous 24-hour tube feeds via J-tube: - Osmolite 1.5 @ 80 ml/hr (1920 ml/day)  Continuous tube feeding regimen provides 2880 kcal, 120 grams of protein, and 1463 ml of H2O.  Maintain HOB >30 degrees at all times while TF infusing.  NUTRITION DIAGNOSIS:   Severe Malnutrition related to chronic illness (esophageal cancer) as evidenced by moderate fat depletion, severe muscle depletion, percent weight loss (25.9% weight loss in less than 1 year).  Ongoing, being addressed via TF  GOAL:   Patient will meet greater than or equal to 90% of their needs  Met via TF  MONITOR:   Vent status, Labs, Weight trends, TF tolerance, Skin, I & O's  REASON FOR ASSESSMENT:   Consult Enteral/tube feeding initiation and management  ASSESSMENT:   62 y.o. male admits related to SOB and generalized weakness. PMH includes esophageal cancer. Pt is currently receiving medical management for fevers and failure to thrive.  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 Per notes, pt is draining very thick foul-smelling discharge from chest tube. Pt  back on IV abx and IV diflucan which were previously discontinued. Pt switched from propofol to precedex this AM. Pt currently on pressor support with levophed and vasopressin.  Pt currently on 27-POEU cyclic tube feeds. Given pt is now intubated on ventilatory support, will transition back to continuous 24-hour feeds. Free water flushes and normal saline flushes for hyponatremia have both been discontinued.  Pt with non-pitting edema to BLE.  Admit weight: 80.5 kg Current weight: 81.6 kg  Current TF: Osmolite 1.5 @ 95 ml/hr x 20 hours from 1400 to 1000  Patient is currently intubated on ventilator support MV: 19.1 L/min Temp (24hrs), Avg:99.9 F (37.7 C), Min:98.5 F (36.9 C), Max:102.2 F (39 C) BP (cuff): 127/90 MAP (cuff): 101  Drips: Precedex Fentanyl Levophed: 40 mcg/min Vasopressin: 0.03 units/min  Medications reviewed and include: colace, Nutrisource fiber 1 packet daily, IV solu-cortef, SSI q 4 hours, semglee 10 units daily, IV reglan 10 mg q 6 hours, IV zofran 4 mg daily at bedtime, IV protonix, miralax, IV diflucan 400 mg daily, IV abx  Labs reviewed: sodium 134, potassium 5.2, BUN 46, creatinine 1.34, ionized calcium 1.41, lactic acid 5.5, hemoglobin 8.8 CBG's: 223-343 x 24 hours  UOP: 650 ml x 24 hours 19 Fr R chest JP drain: 20 ml x 24 hours RUQ JP drain: 40 ml x 24 hours I/O's: +45.4 L since admit  Diet Order:   Diet Order             Diet NPO time specified  Diet effective now                   EDUCATION NEEDS:   No  education needs have been identified at this time  Skin:  Skin Assessment: Skin Integrity Issues: Incisions: pain and leakage at J-tube site [facility] RN consulted but unable to provide recommendations due to tightness of sutures); chest incisions  Last BM:  11/01/22  Height:   Ht Readings from Last 1 Encounters:  10/10/2022 '5\' 10"'$  (1.778 m)    Weight:   Wt Readings from Last 1 Encounters:  11/07/22 81.6 kg    BMI:  Body mass  index is 25.81 kg/m.  Estimated Nutritional Needs:   Kcal:  2400-2700 kcals  Protein:  120-140 grams  Fluid:  >/= 2 L    Gustavus Bryant, MS, RD, LDN Inpatient Clinical Dietitian Please see AMiON for contact information.

## 2022-11-07 NOTE — Progress Notes (Signed)
Inpatient Diabetes Program Recommendations  AACE/ADA: New Consensus Statement on Inpatient Glycemic Control (2015)  Target Ranges:  Prepandial:   less than 140 mg/dL      Peak postprandial:   less than 180 mg/dL (1-2 hours)      Critically ill patients:  140 - 180 mg/dL   Lab Results  Component Value Date   GLUCAP 260 (H) 11/07/2022   HGBA1C 4.8 11/06/2022    Review of Glycemic Control  Latest Reference Range & Units 11/06/22 20:29 11/07/22 00:07 11/07/22 04:22 11/07/22 08:58 11/07/22 11:41  Glucose-Capillary 70 - 99 mg/dL 343 (H) 268 (H) 250 (H) 274 (H) 260 (H)   Diabetes history: None  Current orders for Inpatient glycemic control:  Novolog 0-15 units q 4 hours Osmolite 1.5 ml at 95 ml/hr (2 PM-10AM) Solucortef 100 mg bid Semglee 10 units daily  Inpatient Diabetes Program Recommendations:    May consider adding Novolog 3 units q 4 hours for tube feed coverage (only give when tube feeds are infusing) so may need to omit 12 noon dose.   Thanks,  Adah Perl, RN, BC-ADM Inpatient Diabetes Coordinator Pager [phone]  (8a-5p)

## 2022-11-07 NOTE — Progress Notes (Addendum)
[address]       [place] [address]             [phone]                 40 Days Post-Op Procedure(s) (LRB): ESOPHAGOGASTRODUODENOSCOPY (EGD) WITH PROPOFOL (N/A) ESOPHAGEAL STENT PLACEMENT (N/A) STENT REMOVAL   Events: Remains intubated Increasing pressor requirements _______________________________________________________________ Vitals: BP (!) 86/64   Pulse (!) 123   Temp (!) 102.2 F (39 C) (Oral)   Resp (!) 35   Ht '5\' 10"'$  (1.778 m)   Wt 81.6 kg   SpO2 91%   BMI 25.81 kg/m  Filed Weights   11/05/22 0313 11/06/22 0500 11/07/22 0449  Weight: 79.5 kg 80.5 kg 81.6 kg     - Neuro: arousable  - Cardiovascular: sinus tach  Drips: levo 59.  Vaso 0.04.  epi 0.5 CVP:  [5 mmHg] 5 mmHg  - Pulm: minimal CT output Vent Mode: PRVC FiO2 (%):  [80 %-100 %] 100 % Set Rate:  [30 bmp-35 bmp] 35 bmp Vt Set:  [580 mL] 580 mL PEEP:  [5 cmH20-10 cmH20] 10 cmH20 Plateau Pressure:  [21 cmH20-31 cmH20] 31 cmH20  ABG    Component Value Date/Time   PHART 7.256 (L) 11/07/2022 0440   PCO2ART 61.6 (H) 11/07/2022 0440   PO2ART 84 11/07/2022 0440   HCO3 26.8 11/07/2022 0440   TCO2 28 11/07/2022 0440   ACIDBASEDEF 3.0 (H) 11/06/2022 1119   O2SAT 92 11/07/2022 0440    - Abd: ND - Extremity: warm  .Intake/Output      01/21 0701 01/22 0700 01/22 0701 01/23 0700   P.O.     I.V. (mL/kg) 2287.3 (28)    Other 100    NG/GT 1602    IV Piggyback 949.8    Total Intake(mL/kg) 4939.2 (60.5)    Urine (mL/kg/hr) 650 (0.3)    Drains 60    Total Output 710    Net +4229.2            _______________________________________________________________ Labs:    Latest Ref Rng & Units 11/07/2022    4:40 AM 11/06/2022   11:19 AM 11/06/2022    2:31 AM  CBC  Hemoglobin 13.0 - 17.0 g/dL 8.8  10.2  9.9   Hematocrit 39.0 - 52.0 % 26.0  30.0  29.0       Latest Ref Rng & Units 11/07/2022    4:40 AM 11/07/2022    4:10 AM 11/06/2022    5:19 PM  CMP  Glucose 70 -  99 mg/dL  240    BUN 8 - 23 mg/dL  46    Creatinine 0.61 - 1.24 mg/dL  1.34    Sodium 135 - 145 mmol/L 134  132  134   Potassium 3.5 - 5.1 mmol/L 5.2  4.8    Chloride 98 - 111 mmol/L  99    CO2 22 - 32 mmol/L  26    Calcium 8.9 - 10.3 mg/dL  9.0      CXR: Bilateral infiltrated R effusion  _______________________________________________________________  Assessment and Plan: S/P Esophagectomy, with anastomotic leak  Neuro: on precedex CV: on levo vaso, and now epi.  Sinus tach.   Pulm: continue vent support Renal: creat up slightly GI: NPO.  On tube feeds Heme: stable ID: [email or web address] Endo: SSI Dispo: continue ICU care   Davied Nocito O Mussa Groesbeck 11/07/2022 8:08 AM

## 2022-11-08 ENCOUNTER — Inpatient Hospital Stay (HOSPITAL_COMMUNITY): Payer: No Typology Code available for payment source

## 2022-11-08 DIAGNOSIS — Z9911 Dependence on respirator [ventilator] status: Secondary | ICD-10-CM | POA: Diagnosis not present

## 2022-11-08 DIAGNOSIS — J189 Pneumonia, unspecified organism: Secondary | ICD-10-CM

## 2022-11-08 DIAGNOSIS — J9601 Acute respiratory failure with hypoxia: Secondary | ICD-10-CM | POA: Diagnosis not present

## 2022-11-08 LAB — GLUCOSE, CAPILLARY
Glucose-Capillary: 165 mg/dL — ABNORMAL HIGH (ref 70–99)
Glucose-Capillary: 166 mg/dL — ABNORMAL HIGH (ref 70–99)
Glucose-Capillary: 168 mg/dL — ABNORMAL HIGH (ref 70–99)
Glucose-Capillary: 178 mg/dL — ABNORMAL HIGH (ref 70–99)
Glucose-Capillary: 182 mg/dL — ABNORMAL HIGH (ref 70–99)
Glucose-Capillary: 187 mg/dL — ABNORMAL HIGH (ref 70–99)
Glucose-Capillary: 190 mg/dL — ABNORMAL HIGH (ref 70–99)
Glucose-Capillary: 219 mg/dL — ABNORMAL HIGH (ref 70–99)
Glucose-Capillary: 235 mg/dL — ABNORMAL HIGH (ref 70–99)
Glucose-Capillary: 287 mg/dL — ABNORMAL HIGH (ref 70–99)
Glucose-Capillary: 306 mg/dL — ABNORMAL HIGH (ref 70–99)
Glucose-Capillary: 314 mg/dL — ABNORMAL HIGH (ref 70–99)
Glucose-Capillary: 328 mg/dL — ABNORMAL HIGH (ref 70–99)
Glucose-Capillary: 332 mg/dL — ABNORMAL HIGH (ref 70–99)

## 2022-11-08 LAB — POCT I-STAT 7, (LYTES, BLD GAS, ICA,H+H)
Acid-Base Excess: 1 mmol/L (ref 0.0–2.0)
Acid-base deficit: 3 mmol/L — ABNORMAL HIGH (ref 0.0–2.0)
Bicarbonate: 25.1 mmol/L (ref 20.0–28.0)
Bicarbonate: 27.5 mmol/L (ref 20.0–28.0)
Calcium, Ion: 1.46 mmol/L — ABNORMAL HIGH (ref 1.15–1.40)
Calcium, Ion: 1.36 mmol/L (ref 1.15–1.40)
HCT: 26 % — ABNORMAL LOW (ref 39.0–52.0)
HCT: 24 % — ABNORMAL LOW (ref 39.0–52.0)
Hemoglobin: 8.8 g/dL — ABNORMAL LOW (ref 13.0–17.0)
Hemoglobin: 8.2 g/dL — ABNORMAL LOW (ref 13.0–17.0)
O2 Saturation: 82 %
O2 Saturation: 94 %
Patient temperature: 103.6
Patient temperature: 38.2
Potassium: 5.2 mmol/L — ABNORMAL HIGH (ref 3.5–5.1)
Potassium: 4.9 mmol/L (ref 3.5–5.1)
Sodium: 139 mmol/L (ref 135–145)
Sodium: 139 mmol/L (ref 135–145)
TCO2: 27 mmol/L (ref 22–32)
TCO2: 29 mmol/L (ref 22–32)
pCO2 arterial: 70.9 mmHg (ref 32–48)
pCO2 arterial: 59.4 mmHg — ABNORMAL HIGH (ref 32–48)
pH, Arterial: 7.172 — CL (ref 7.35–7.45)
pH, Arterial: 7.278 — ABNORMAL LOW (ref 7.35–7.45)
pO2, Arterial: 69 mmHg — ABNORMAL LOW (ref 83–108)
pO2, Arterial: 85 mmHg (ref 83–108)

## 2022-11-08 LAB — SODIUM: Sodium: 141 mmol/L (ref 135–145)

## 2022-11-08 LAB — LACTIC ACID, PLASMA: Lactic Acid, Venous: 5.7 mmol/L (ref 0.5–1.9)

## 2022-11-08 LAB — BASIC METABOLIC PANEL
Anion gap: 14 (ref 5–15)
BUN: 63 mg/dL — ABNORMAL HIGH (ref 8–23)
CO2: 23 mmol/L (ref 22–32)
Calcium: 9.5 mg/dL (ref 8.9–10.3)
Chloride: 101 mmol/L (ref 98–111)
Creatinine, Ser: 1.47 mg/dL — ABNORMAL HIGH (ref 0.61–1.24)
GFR, Estimated: 53 mL/min — ABNORMAL LOW (ref >60)
Glucose, Bld: 320 mg/dL — ABNORMAL HIGH (ref 70–99)
Potassium: 4.8 mmol/L (ref 3.5–5.1)
Sodium: 138 mmol/L (ref 135–145)

## 2022-11-08 MED ORDER — SODIUM BICARBONATE 8.4 % IV SOLN
50.0000 meq | Freq: Once | INTRAVENOUS | Status: AC
  Administered 2022-11-08: 50 meq via INTRAVENOUS

## 2022-11-08 MED ORDER — PROSOURCE TF20 ENFIT COMPATIBL EN LIQD
60.0000 mL | Freq: Four times a day (QID) | ENTERAL | Status: DC
  Administered 2022-11-08 – 2022-11-09 (×5): 60 mL
  Filled 2022-11-08 (×5): qty 60

## 2022-11-08 MED ORDER — INSULIN ASPART 100 UNIT/ML IJ SOLN
3.0000 [IU] | INTRAMUSCULAR | Status: DC
  Administered 2022-11-08: 3 [IU] via SUBCUTANEOUS

## 2022-11-08 MED ORDER — OSMOLITE 1.5 CAL PO LIQD
1000.0000 mL | ORAL | Status: DC
  Administered 2022-11-09 – 2022-11-10 (×2): 1000 mL
  Filled 2022-11-08: qty 1000

## 2022-11-08 MED ORDER — SODIUM BICARBONATE 8.4 % IV SOLN: 50.0000 meq | Freq: Once | INTRAVENOUS | Status: DC

## 2022-11-08 MED ORDER — DEXTROSE 50 % IV SOLN: 0.0000 mL | INTRAVENOUS | Status: DC | PRN

## 2022-11-08 MED ORDER — SODIUM CHLORIDE 0.9 % IV SOLN: INTRAVENOUS | Status: DC | PRN

## 2022-11-08 MED ORDER — INSULIN REGULAR(HUMAN) IN NACL 100-0.9 UT/100ML-% IV SOLN
INTRAVENOUS | Status: DC
  Administered 2022-11-08: 4.4 [IU]/h via INTRAVENOUS
  Filled 2022-11-08: qty 100

## 2022-11-08 MED ORDER — GERHARDT'S BUTT CREAM
TOPICAL_CREAM | Freq: Three times a day (TID) | CUTANEOUS | Status: DC
  Administered 2022-11-09 – 2022-11-13 (×4): 1 via TOPICAL
  Filled 2022-11-08: qty 1

## 2022-11-08 MED ORDER — SODIUM CHLORIDE 0.9 % IV SOLN
1.0000 g | Freq: Three times a day (TID) | INTRAVENOUS | Status: DC
  Administered 2022-11-08 – 2022-11-18 (×30): 1 g via INTRAVENOUS
  Filled 2022-11-08 (×30): qty 20

## 2022-11-08 MED ORDER — INSULIN GLARGINE-YFGN 100 UNIT/ML ~~LOC~~ SOLN
15.0000 [IU] | Freq: Two times a day (BID) | SUBCUTANEOUS | Status: DC
  Administered 2022-11-08 (×2): 15 [IU] via SUBCUTANEOUS
  Filled 2022-11-08 (×4): qty 0.15

## 2022-11-08 MED ORDER — SODIUM CHLORIDE 0.9 % IV SOLN
150.0000 mg | INTRAVENOUS | Status: DC
  Administered 2022-11-08 – 2022-11-17 (×10): 150 mg via INTRAVENOUS
  Filled 2022-11-08 (×11): qty 7.5

## 2022-11-08 MED ORDER — EPINEPHRINE HCL 5 MG/250ML IV SOLN IN NS
0.5000 ug/min | INTRAVENOUS | Status: DC
  Administered 2022-11-08: 0.5 ug/min via INTRAVENOUS
  Filled 2022-11-08: qty 250

## 2022-11-08 MED ORDER — SODIUM BICARBONATE 8.4 % IV SOLN
INTRAVENOUS | Status: AC
  Filled 2022-11-08: qty 50

## 2022-11-08 NOTE — Procedures (Signed)
Arterial Line Insertion Start/End1/23/2024 1:45 AM, 11/08/2022 1:55 AM  Patient location: ICU. Preanesthetic checklist: patient identified, site marked, monitors and equipment checked and timeout performed Right, radial was placed Catheter size: 20 G Hand hygiene performed  and maximum sterile barriers used  Allen's test indicative of satisfactory collateral circulation Attempts: 1 Procedure performed without using ultrasound guided technique. Following insertion, Biopatch and dressing applied. Post procedure assessment: normal  Patient tolerated the procedure well with no immediate complications.

## 2022-11-08 NOTE — Consult Note (Signed)
WOC Nurse Consult Note: Reason for Consult:Deep tissue pressure injury to coccyx Wound type:pressure Pressure Injury POA: No Measurement:2cm x 1cm  Wound PQD:IYMEBR/AXENMM discoloration of intact skin Drainage (amount, consistency, odor) None Periwound:intact Dressing procedure/placement/frequency:I have provided Nursing with guidance for the care of this skin injury using a daily NS cleanse, pat dry and cover with an antimicrobial nonadherent (xeroform) gauze. This is to be covered with a dry gauze 2x2 and secured with a silicone foam placed so that the "tip" is oriented pointing away from the anus. Turning and repositioning is in place, time in the supine position is to be limited/minimized. Prevalon Boots are provided.  [facility] nursing team will follow, seeking every 7-10 days and will remain available to this patient, the nursing and medical teams.   Thank you for inviting [place] to participate in this patient's Plan of Care.  Maudie Flakes, MSN, RN, CNS, [place], Serita Grammes, [employer], [employer] phone:  [phone]

## 2022-11-08 NOTE — Progress Notes (Signed)
PHARMACY NOTE:  ANTIMICROBIAL RENAL DOSAGE ADJUSTMENT  Current antimicrobial regimen includes a mismatch between antimicrobial dosage and estimated renal function.  As per policy approved by the Pharmacy & Therapeutics and Medical Executive Committees, the antimicrobial dosage will be adjusted accordingly.  Current antimicrobial dosage:  meropenem 1g IV every 12 hours  Indication: esophageal leak  Renal Function:  Estimated Creatinine Clearance: 53.1 mL/min (A) (by C-G formula based on SCr of 1.47 mg/dL (H)). '[]'$      On intermittent HD, scheduled: '[]'$      On CRRT    Antimicrobial dosage has been changed to:  meropenem 1g IV every 8 hours  Additional comments:   Thank you for allowing pharmacy to be a part of this patient's care.  Antonietta Jewel, PharmD, [place] Clinical Pharmacist  Phone: [phone] 11/08/2022 4:14 PM  Please check AMION for all [place] phone numbers After 10:00 PM, call [place] [phone]

## 2022-11-08 NOTE — Progress Notes (Signed)
Patient appears uncomfortable and in pain during urination.  Provider notified. Post-void residual 70m.

## 2022-11-08 NOTE — Progress Notes (Signed)
NAME:  [name] [name], MRN:  [id number], DOB:  [birth year], LOS: 50 ADMISSION DATE:  09/24/2022, CONSULTATION DATE: 11/06/2022 REFERRING MD: Dr. 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  Overnight patient became short of breath, started spiking fever so he was transferred to ICU.  This morning patient was started spiking fever to 103, remained tachycardic and tachypneic, PCCM was called for help evaluation medical management  Pertinent  Medical History   Past Medical History:  Diagnosis Date   Cancer [facility])    Esophageal Cancer   History of radiation therapy    Esophagus- 05/12/22-06/22/22- Dr. Gery Pray   Port-A-Cath in place 05/11/2022     Significant Hospital Events: Including procedures, antibiotic start and stop dates in addition to other pertinent events   12/1 admitted for drainage from empyema 12/13 EGD with stent placement.  Interim History / Subjective:  More hypotensive overnight.   Objective   Blood pressure (!) 83/48, pulse (!) 141, temperature (!) 100.9 F (38.3 C), temperature source Esophageal, resp. rate (!) 27, height 5' 10"  (1.778 m), weight 85.9 kg, SpO2 96 %. CVP:  [5 mmHg] 5 mmHg  Vent Mode: PRVC FiO2 (%):  [100 %] 100 % Set Rate:  [32 bmp-35 bmp] 32 bmp Vt Set:  [580 mL] 580 mL PEEP:  [10 cmH20] 10 cmH20 Plateau Pressure:  [19 cmH20-38 cmH20] 31 cmH20   Intake/Output Summary (Last 24 hours) at 11/08/2022 0651 Last data filed at 11/08/2022 0500 Gross per 24 hour  Intake 4477.5 ml  Output 1175 ml  Net 3302.5 ml    Filed Weights   11/06/22 0500 11/07/22 0449 11/08/22 0313  Weight: 80.5 kg 81.6 kg 85.9 kg    Examination:  Physical exam: General: critically ill appearing man  lying in bed in NAD HEENT: [place]/AT, eyes anicteric, ETT Neuro: RASS -2, +cough, moving all extremities  Chest: tachypenic, Pplat 32. Rhales L>R Heart: tachycardic, reg rhythm Abdomen: soft, NT. J-tube without erythema Skin: warm, dry, no rashes  7.28/60/85/28 BUN 63 Cr 1.47 LA 5 Blood culture> NGTD BAL> GNR CXR personally reviewed> improving right upper lobe infiltrate, developing left lower lobe infiltrate.  Endotracheal tube in appropriate position. Persistent left effusion.  Resolved Hospital Problem list     Assessment & Plan:  Acute hypoxic and hypercapnic respiratory failure GNR Aspiration pneumonia-LUL, RLL with ARDS -LTVV, not meeting Pplat goals yet. Decreased Vt.  -VAP prevention protocol -PAD protocol for sedation -daily SAT & SBT as appropriate -wean FiO2 as able  Septic shock due to bilateral multifocal pneumonia Right-sided empyema s/p VATS, currently with chest tube placed -con't vasopressors  & stress dose steroids  -escalate to mica, stop vanc, keep meropenem -decrease to trickle TF  Acute septic encephalopathy -PAD protocol -treat sepsis  Moderate protein energy malnutrition -Trickle TF while on high dose pressors  Constipation; ileus PTA -dulcolax today -con't daily miralax  -reglan  Hyponatremia Acute kidney injury likely due to septic ATN -strict I/O -renally dose meds, avoid nephrotoxic meds -monitor -stop vanc  Anemia of chronic disease -transfuse for Hb <7 or hemodynamically significant bleeding  Adenocarcinoma esophagus s/p esophagectomy, adjuvant chemoradiation Anastomotic leak s/p esophageal stent -management per TCTS  Hyperglycemia due to acute illness, steroids. A1c is 4.3 -increase glargine to 15 units BID -SSI PRN -goal BG 140-180  Wife updated at bedside with Dr. Kipp Brood during rounds.   Best Practice (right click and "Reselect all SmartList Selections" daily)   Diet/type: tubefeeds and NPO DVT prophylaxis:  prophylactic heparin  GI prophylaxis: PPI Lines: Central line and yes and it is still needed Foley:  N/A Code Status:  DNR Last date of multidisciplinary goals of care discussion [1/21: With the patient's wife, please see Ipal note]  Labs   CBC: Recent Labs  Lab 11/02/22 0040 11/03/22 0400 11/05/22 0544 11/06/22 0230 11/06/22 0231 11/06/22 1119 11/07/22 0440 11/08/22 0238 11/08/22 0422  WBC 7.3 9.3 8.0 6.5  --   --   --   --   --   NEUTROABS  --  7.9*  --   --   --   --   --   --   --   HGB 7.9* 8.1* 7.8* 9.5* 9.9* 10.2* 8.8* 8.8* 8.2*  HCT 24.7* 25.9* 24.2* 30.3* 29.0* 30.0* 26.0* 26.0* 24.0*  MCV 96.1 96.6 96.0 97.7  --   --   --   --   --   PLT 284 308 291 377  --   --   --   --   --      Basic Metabolic Panel: Recent Labs  Lab 11/02/22 0040 11/02/22 1330 11/02/22 1720 11/04/22 1439 11/05/22 0544 11/06/22 0230 11/06/22 0231 11/07/22 0410 11/07/22 0440 11/08/22 0119 11/08/22 0238 11/08/22 0422  NA 134*   < >  --    < > 131* 134*   < > 132* 134* 138 139 139  K 4.2  --   --   --  3.8 4.2   < > 4.8 5.2* 4.8 5.2* 4.9  CL 98  --   --   --  94* 95*  --  99  --  101  --   --   CO2 28  --   --   --  28 28  --  26  --  23  --   --   GLUCOSE 153*  --   --   --  239* 329*  --  240*  --  320*  --   --   BUN 20  --   --   --  18 24*  --  46*  --  63*  --   --   CREATININE 0.66  --   --   --  0.63 0.78  --  1.34*  --  1.47*  --   --   CALCIUM 9.2  --   --   --  9.2 9.7  --  9.0  --  9.5  --   --   MG  --   --  1.7  --   --  1.6*  --  2.4  --   --   --   --    < > = values in this interval not displayed.    GFR: Estimated Creatinine Clearance: 53.1 mL/min (A) (by C-G formula based on SCr of 1.47 mg/dL (H)). Recent Labs  Lab 11/02/22 0040 11/03/22 0400 11/05/22 0544 11/06/22 0230 11/06/22 2023 11/07/22 1155 11/07/22 1554  WBC 7.3 9.3 8.0 6.5  --   --   --   LATICACIDVEN  --   --   --   --  5.5*  5.2* 5.5* 5.0*     This patient is critically ill with multiple  organ system failure which requires frequent high complexity decision making, assessment, support, evaluation,  and titration of therapies. This was completed through the application of advanced monitoring technologies and extensive interpretation of multiple databases. During this encounter critical care time was devoted to patient care services described in this note for 35 minutes.  Julian Hy, DO 11/08/22 8:30 AM [facility]  For contact information, see Amion. If no response to pager, please call PCCM consult pager. After hours, 7PM- 7AM, please call [place].

## 2022-11-08 NOTE — Progress Notes (Signed)
Nutrition Follow-up  DOCUMENTATION CODES:   Severe malnutrition in context of chronic illness  INTERVENTION:   Tube Feeding via J-tube : trickle TF only currently. Due to enteral feeding shortages, if no Osmolite 1.5 available, ok to switch to Vital 1.5 Osmolite 1.5 at 20 ml/hr Add Pro-Source TF20 q 6 hours for protein   NUTRITION DIAGNOSIS:   Severe Malnutrition related to chronic illness (esophageal cancer) as evidenced by moderate fat depletion, severe muscle depletion, percent weight loss (25.9% weight loss in less than 1 year).  Being addressed via TF  GOAL:   Patient will meet greater than or equal to 90% of their needs  Progressing  MONITOR:   Vent status, Labs, Weight trends, TF tolerance, Skin, I & O's  REASON FOR ASSESSMENT:   Consult Enteral/tube feeding initiation and management  ASSESSMENT:   62 y.o. male admits related to SOB and generalized weakness. PMH includes esophageal cancer. Pt is currently receiving medical management for fevers and failure to thrive.  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 Required increased pressors overnight. Levophed at 54, vaso at 0.04, started on epinephrine 0.5 Febrile with Tmax 103.6  Osmolite 1.5 TF currently infusing at 20 ml/hr via J-tube. TF turned down due to hemodynamic instability and  increasing pressors. TF previously infusing at 80 ml/hr via J-tube. CBGs remain 200-300s despite TF at trickle only  UOP only 1.15 L in 24 hours; Creatinine at 1.47, BUN 63-both trending up Weight up to 85.9 kg  Chest tubes with foul smelling output but total output only 25 ml in 24 hours  +constipation, No documented BM since 1/16? Per documentation from yesterday but current documentation indicating 2 weeks ago. Noted bowel regimen ordered, reglan. Plan to stop nutrisource fiber for now. MD aware and being addressed  Labs: CBGs 287-328, lactic acid 5.7 Meds: ss novolog, novolog q 4 hours, semglee   Diet Order:   Diet Order             Diet NPO time specified  Diet effective now                   EDUCATION NEEDS:   No education needs have been identified at this time  Skin:  Skin Assessment: Skin Integrity Issues: Skin Integrity Issues:: Incisions Incisions: pain at J-tube site, [place] RN consulted; chest incisions  Last BM:  11/01/22  Height:   Ht Readings from Last 1 Encounters:  10/01/2022 '5\' 10"'$  (1.778 m)    Weight:   Wt Readings from Last 1 Encounters:  11/08/22 85.9 kg     BMI:  Body mass index is 27.17 kg/m.  Estimated Nutritional Needs:   Kcal:  2400-2700 kcals  Protein:  120-140 grams  Fluid:  >/= 2 L  Kerman Passey MS, RDN, LDN, CNSC Registered Dietitian 3 Clinical Nutrition RD Pager and On-Call Pager Number Located in [place]

## 2022-11-08 NOTE — Progress Notes (Addendum)
[facility] Progress Note Patient Name: [name] [name] DOB: 27-Jul-1961 MRN: [id number]   Date of Service  11/08/2022  HPI/Events of Note  Profoundly hypotensive with SBP 60s  eICU Interventions  Increased levophed to 60 DC Precedex RT to place A-line   Remains hypotensive. Levophed with tachycardia >150s. Primary team notified. Will add epi. Trial sodium bicarb  Intervention Category Intermediate Interventions: Hypotension - evaluation and management  [name] [name] 11/08/2022, 1:25 AM

## 2022-11-08 NOTE — Progress Notes (Signed)
TCTS Progress Note: 41 Days Post-Op Procedure(s) (LRB): ESOPHAGOGASTRODUODENOSCOPY (EGD) WITH PROPOFOL (N/A) ESOPHAGEAL STENT PLACEMENT (N/A) STENT REMOVAL  LOS: 53 days   Off vaso / epi  59 levo is down to 35.  TF at 20. 100% Fi02.   Wife has told nursing that Attending MD discussed possibility of daily Xrs.   Plan: cont current care     Latest Ref Rng & Units 11/08/2022    4:22 AM 11/08/2022    2:38 AM 11/07/2022    4:40 AM  CBC  Hemoglobin 13.0 - 17.0 g/dL 8.2  8.8  8.8   Hematocrit 39.0 - 52.0 % 24.0  26.0  26.0        Latest Ref Rng & Units 11/08/2022    1:30 PM 11/08/2022    4:22 AM 11/08/2022    2:38 AM  CMP  Sodium 135 - 145 mmol/L 141  139  139   Potassium 3.5 - 5.1 mmol/L  4.9  5.2     ABG    Component Value Date/Time   PHART 7.278 (L) 11/08/2022 0422   PCO2ART 59.4 (H) 11/08/2022 0422   PO2ART 85 11/08/2022 0422   HCO3 27.5 11/08/2022 0422   TCO2 29 11/08/2022 0422   ACIDBASEDEF 3.0 (H) 11/08/2022 0238   O2SAT 94 11/08/2022 0422    Vent Mode: PRVC FiO2 (%):  [100 %] 100 % Set Rate:  [32 bmp-35 bmp] 32 bmp Vt Set:  [550 mL-580 mL] 550 mL PEEP:  [10 cmH20] 10 cmH20 Plateau Pressure:  [19 cmH20-35 cmH20] 27 cmH20

## 2022-11-08 NOTE — Progress Notes (Signed)
Inpatient Diabetes Program Recommendations  AACE/ADA: New Consensus Statement on Inpatient Glycemic Control (2015)  Target Ranges:  Prepandial:   less than 140 mg/dL      Peak postprandial:   less than 180 mg/dL (1-2 hours)      Critically ill patients:  140 - 180 mg/dL   Lab Results  Component Value Date   GLUCAP 332 (H) 11/08/2022   HGBA1C 4.8 11/06/2022    Review of Glycemic Control  Latest Reference Range & Units 11/08/22 03:34 11/08/22 07:57 11/08/22 11:51  Glucose-Capillary 70 - 99 mg/dL 306 (H) 328 (H) 332 (H)  Diabetes history: None   Current orders for Inpatient glycemic control:  Novolog 0-15 units q 4 hours Osmolite 1.5 ml at 20 ml/hr  Solucortef 100 mg bid Semglee 15 units bid Novolog 3 units q 4 hours Inpatient Diabetes Program Recommendations:    Note blood sugars>goal. Changes made today in SQ regimen.  If blood sugars remain >goal, consider ICU glycemic control order set/Phase 2- IV insulin.   Thanks,  Adah Perl, RN, BC-ADM Inpatient Diabetes Coordinator Pager [phone]  (8a-5p)

## 2022-11-08 NOTE — Progress Notes (Signed)
Patient seen at bedside  On multiple pressors Tachycardic, hypotensive Saline for pressors raise Order for epinephrine drip placed Backed off on sedation as it was trending hypotensive  Tmax 103 -Cooling blanket placed -Received Tylenol  Ventilator changes secondary to auto PEEP. Last ABG 7.17/71/69  Continue aggressive support Prognosis is guarded  Does not appear the much more can be done to get him more comfortable at present

## 2022-11-09 DIAGNOSIS — A419 Sepsis, unspecified organism: Secondary | ICD-10-CM | POA: Diagnosis not present

## 2022-11-09 DIAGNOSIS — J80 Acute respiratory distress syndrome: Secondary | ICD-10-CM

## 2022-11-09 DIAGNOSIS — Z9911 Dependence on respirator [ventilator] status: Secondary | ICD-10-CM | POA: Diagnosis not present

## 2022-11-09 DIAGNOSIS — R6521 Severe sepsis with septic shock: Secondary | ICD-10-CM | POA: Diagnosis not present

## 2022-11-09 LAB — BASIC METABOLIC PANEL
Anion gap: 8 (ref 5–15)
Anion gap: 7 (ref 5–15)
BUN: 72 mg/dL — ABNORMAL HIGH (ref 8–23)
BUN: 77 mg/dL — ABNORMAL HIGH (ref 8–23)
CO2: 30 mmol/L (ref 22–32)
CO2: 30 mmol/L (ref 22–32)
Calcium: 10.2 mg/dL (ref 8.9–10.3)
Calcium: 10.5 mg/dL — ABNORMAL HIGH (ref 8.9–10.3)
Chloride: 105 mmol/L (ref 98–111)
Chloride: 107 mmol/L (ref 98–111)
Creatinine, Ser: 0.97 mg/dL (ref 0.61–1.24)
Creatinine, Ser: 0.9 mg/dL (ref 0.61–1.24)
GFR, Estimated: >60 mL/min (ref >60)
GFR, Estimated: >60 mL/min (ref >60)
Glucose, Bld: 204 mg/dL — ABNORMAL HIGH (ref 70–99)
Glucose, Bld: 186 mg/dL — ABNORMAL HIGH (ref 70–99)
Potassium: 4.5 mmol/L (ref 3.5–5.1)
Potassium: 4.3 mmol/L (ref 3.5–5.1)
Sodium: 143 mmol/L (ref 135–145)
Sodium: 144 mmol/L (ref 135–145)

## 2022-11-09 LAB — CULTURE, RESPIRATORY W GRAM STAIN

## 2022-11-09 LAB — GLUCOSE, CAPILLARY
Glucose-Capillary: 110 mg/dL — ABNORMAL HIGH (ref 70–99)
Glucose-Capillary: 125 mg/dL — ABNORMAL HIGH (ref 70–99)
Glucose-Capillary: 143 mg/dL — ABNORMAL HIGH (ref 70–99)
Glucose-Capillary: 147 mg/dL — ABNORMAL HIGH (ref 70–99)
Glucose-Capillary: 147 mg/dL — ABNORMAL HIGH (ref 70–99)
Glucose-Capillary: 155 mg/dL — ABNORMAL HIGH (ref 70–99)
Glucose-Capillary: 158 mg/dL — ABNORMAL HIGH (ref 70–99)
Glucose-Capillary: 163 mg/dL — ABNORMAL HIGH (ref 70–99)
Glucose-Capillary: 170 mg/dL — ABNORMAL HIGH (ref 70–99)
Glucose-Capillary: 177 mg/dL — ABNORMAL HIGH (ref 70–99)
Glucose-Capillary: 178 mg/dL — ABNORMAL HIGH (ref 70–99)
Glucose-Capillary: 202 mg/dL — ABNORMAL HIGH (ref 70–99)

## 2022-11-09 LAB — MAGNESIUM: Magnesium: 2.6 mg/dL — ABNORMAL HIGH (ref 1.7–2.4)

## 2022-11-09 LAB — LACTIC ACID, PLASMA: Lactic Acid, Venous: 3 mmol/L (ref 0.5–1.9)

## 2022-11-09 MED ORDER — PROSOURCE TF20 ENFIT COMPATIBL EN LIQD
60.0000 mL | ENTERAL | Status: DC
  Administered 2022-11-09 – 2022-11-10 (×4): 60 mL
  Filled 2022-11-09 (×5): qty 60

## 2022-11-09 MED ORDER — SORBITOL 70 % SOLN
30.0000 mL | Freq: Once | Status: AC
  Administered 2022-11-09: 30 mL
  Filled 2022-11-09: qty 30

## 2022-11-09 MED ORDER — MIDAZOLAM HCL 2 MG/2ML IJ SOLN
1.0000 mg | INTRAMUSCULAR | Status: AC | PRN
  Administered 2022-11-10 (×3): 2 mg via INTRAVENOUS
  Filled 2022-11-09 (×3): qty 2

## 2022-11-09 MED ORDER — BISACODYL 10 MG RE SUPP
10.0000 mg | Freq: Once | RECTAL | Status: AC
  Administered 2022-11-09: 10 mg via RECTAL
  Filled 2022-11-09: qty 1

## 2022-11-09 MED ORDER — AMIODARONE IV BOLUS ONLY 150 MG/100ML
150.0000 mg | Freq: Once | INTRAVENOUS | Status: AC
  Administered 2022-11-09: 150 mg via INTRAVENOUS
  Filled 2022-11-09: qty 100

## 2022-11-09 MED ORDER — INSULIN GLARGINE-YFGN 100 UNIT/ML ~~LOC~~ SOLN
30.0000 [IU] | Freq: Two times a day (BID) | SUBCUTANEOUS | Status: DC
  Filled 2022-11-09: qty 0.3

## 2022-11-09 MED ORDER — FREE WATER
200.0000 mL | Freq: Four times a day (QID) | Status: DC
  Administered 2022-11-09 – 2022-11-10 (×3): 200 mL

## 2022-11-09 MED ORDER — AMIODARONE HCL IN DEXTROSE 360-4.14 MG/200ML-% IV SOLN
60.0000 mg/h | INTRAVENOUS | Status: DC
  Administered 2022-11-09 (×2): 60 mg/h via INTRAVENOUS
  Filled 2022-11-09: qty 200

## 2022-11-09 MED ORDER — INSULIN ASPART 100 UNIT/ML IJ SOLN
5.0000 [IU] | INTRAMUSCULAR | Status: DC
  Administered 2022-11-09 – 2022-11-11 (×10): 5 [IU] via SUBCUTANEOUS

## 2022-11-09 MED ORDER — AMIODARONE HCL IN DEXTROSE 360-4.14 MG/200ML-% IV SOLN
30.0000 mg/h | INTRAVENOUS | Status: DC
  Administered 2022-11-09 – 2022-11-14 (×11): 30 mg/h via INTRAVENOUS
  Filled 2022-11-09 (×11): qty 200

## 2022-11-09 MED ORDER — INSULIN GLARGINE-YFGN 100 UNIT/ML ~~LOC~~ SOLN
30.0000 [IU] | Freq: Two times a day (BID) | SUBCUTANEOUS | Status: DC
  Administered 2022-11-09 (×2): 30 [IU] via SUBCUTANEOUS
  Filled 2022-11-09 (×4): qty 0.3

## 2022-11-09 MED ORDER — INSULIN ASPART 100 UNIT/ML IJ SOLN
0.0000 [IU] | INTRAMUSCULAR | Status: DC
  Administered 2022-11-09 (×2): 2 [IU] via SUBCUTANEOUS
  Administered 2022-11-09: 3 [IU] via SUBCUTANEOUS
  Administered 2022-11-10 – 2022-11-11 (×6): 2 [IU] via SUBCUTANEOUS
  Administered 2022-11-12: 3 [IU] via SUBCUTANEOUS
  Administered 2022-11-12: 2 [IU] via SUBCUTANEOUS
  Administered 2022-11-12: 3 [IU] via SUBCUTANEOUS
  Administered 2022-11-12 (×2): 2 [IU] via SUBCUTANEOUS
  Administered 2022-11-13: 3 [IU] via SUBCUTANEOUS

## 2022-11-09 NOTE — Progress Notes (Signed)
NAME:  [name] [name], MRN:  [id number], DOB:  09-Mar-1961, LOS: 30 ADMISSION DATE:  10/11/2022, CONSULTATION DATE: 11/06/2022 REFERRING MD: Dr. 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  Overnight patient became short of breath, started spiking fever so he was transferred to ICU.  This morning patient was started spiking fever to 103, remained tachycardic and tachypneic, PCCM was called for help evaluation medical management  Pertinent  Medical History   Past Medical History:  Diagnosis Date   Cancer [facility])    Esophageal Cancer   History of radiation therapy    Esophagus- 05/12/22-06/22/22- Dr. Gery Pray   Port-A-Cath in place 05/11/2022     Significant Hospital Events: Including procedures, antibiotic start and stop dates in addition to other pertinent events   12/1 admitted for drainage from empyema 12/13 EGD with stent placement.  Interim History / Subjective:  Remains on   Objective   Blood pressure (!) 162/93, pulse 81, temperature 98.1 F (36.7 C), resp. rate (!) 32, height '5\' 10"'$  (1.778 m), weight 87.4 kg, SpO2 100 %.    Vent Mode: PRVC FiO2 (%):  [90 %-100 %] 90 % Set Rate:  [32 bmp] 32 bmp Vt Set:  [550 mL] 550 mL PEEP:  [10 cmH20] 10 cmH20 Plateau Pressure:  [27 cmH20-36 cmH20] 34 cmH20   Intake/Output Summary (Last 24 hours) at 11/09/2022 1008 Last data filed at 11/09/2022 1000 Gross per 24 hour  Intake 2503.6 ml  Output 3231 ml  Net -727.4 ml    Filed Weights   11/07/22 0449 11/08/22 0313 11/09/22 0430  Weight: 81.6 kg 85.9 kg 87.4 kg    Examination:  Physical exam: General: critically ill appearing lying in bed in NAD HEENT: [place]/AT, eyes anicteric, eTT Neuro: RASS -3, tracks with  eyes, not following commands Chest: 2 bulb drains from R chest with foul-smelling drainage Heart: tachycardic, regular rhythm Abdomen: soft, NT, ND Skin: warm, dry, no rashes.  BUN 77 Cr 0.9 LA 3 BG controlled on insulin gtt  Resolved Hospital Problem list     Assessment & Plan:  Acute hypoxic and hypercapnic respiratory failure GNR Aspiration pneumonia-LUL, RLL with ARDS -LTVV, not meeting Pplat goals; need to decrease tidal volume -VAP prevention protocol -PAD protocol for sedation -daily SAT & SBT; needs significant PEEP and FiO2 weaning first  Septic shock due to bilateral multifocal pneumonia Right-sided empyema s/p VATS, currently with chest tube placed -con't vasopressors and stress dose steroids -micafungin, meropenem -keep TF at trickle with high pressor requirements  Acute septic encephalopathy -PAD protocol, aviod deliriogenic meds -con't antibiotics  Moderate protein energy malnutrition -trickle TF; not ready to escalate yet  Constipation; ileus PTA -dulcolax and sorbitol today -daily miralax -reglan  Afib -amiodarone gtt  Hyponatremia Acute kidney injury likely due to septic ATN -strict I/O -renally dose meds, avoid nephrotoxins  Anemia of chronic disease -transfuse for hb <7 or hemodynamically significant bleeding  Adenocarcinoma esophagus s/p esophagectomy, adjuvant chemoradiation Anastomotic leak s/p esophageal stent -management per TCTS  Hyperglycemia due to acute illness, steroids. A1c is 4.3 -transition off insulin gtt today: -increase glargine to 30 units BID -insulin aspart 5 units q4h -SSI PRN -goal BG 140-180   Discussed with Dr. Kipp Brood during rounds, wife not at bedside today.  Best Practice (right click and "Reselect  all SmartList Selections" daily)   Diet/type: tubefeeds and NPO DVT prophylaxis: prophylactic heparin  GI prophylaxis: PPI Lines: Central line and yes and it is still needed Foley:  N/A Code Status:   DNR Last date of multidisciplinary goals of care discussion [1/21: With the patient's wife, please see Ipal note]  Labs   CBC: Recent Labs  Lab 11/03/22 0400 11/05/22 0544 11/06/22 0230 11/06/22 0231 11/06/22 1119 11/07/22 0440 11/08/22 0238 11/08/22 0422  WBC 9.3 8.0 6.5  --   --   --   --   --   NEUTROABS 7.9*  --   --   --   --   --   --   --   HGB 8.1* 7.8* 9.5* 9.9* 10.2* 8.8* 8.8* 8.2*  HCT 25.9* 24.2* 30.3* 29.0* 30.0* 26.0* 26.0* 24.0*  MCV 96.6 96.0 97.7  --   --   --   --   --   PLT 308 291 377  --   --   --   --   --      Basic Metabolic Panel: Recent Labs  Lab 11/02/22 1720 11/04/22 1439 11/06/22 0230 11/06/22 0231 11/07/22 0410 11/07/22 0440 11/08/22 0119 11/08/22 0238 11/08/22 0422 11/08/22 1330 11/09/22 0032 11/09/22 0416  NA  --    < > 134*   < > 132*   < > 138 139 139 141 143 144  K  --    < > 4.2   < > 4.8   < > 4.8 5.2* 4.9  --  4.5 4.3  CL  --    < > 95*  --  99  --  101  --   --   --  105 107  CO2  --    < > 28  --  26  --  23  --   --   --  30 30  GLUCOSE  --    < > 329*  --  240*  --  320*  --   --   --  204* 186*  BUN  --    < > 24*  --  46*  --  63*  --   --   --  72* 77*  CREATININE  --    < > 0.78  --  1.34*  --  1.47*  --   --   --  0.97 0.90  CALCIUM  --    < > 9.7  --  9.0  --  9.5  --   --   --  10.2 10.5*  MG 1.7  --  1.6*  --  2.4  --   --   --   --   --  2.6*  --    < > = values in this interval not displayed.    GFR: Estimated Creatinine Clearance: 86.7 mL/min (by C-G formula based on SCr of 0.9 mg/dL). Recent Labs  Lab 11/03/22 0400 11/05/22 0544 11/06/22 0230 11/06/22 2023 11/07/22 1155 11/07/22 1554 11/08/22 0759 11/09/22 0416  WBC 9.3 8.0 6.5  --   --   --   --   --   LATICACIDVEN  --   --   --    < > 5.5* 5.0* 5.7* 3.0*   < > = values in this interval not displayed.     This patient is critically ill with multiple organ system failure which requires frequent high complexity decision making, assessment,  support, evaluation, and titration of  therapies. This was completed through the application of advanced monitoring technologies and extensive interpretation of multiple databases. During this encounter critical care time was devoted to patient care services described in this note for 34 minutes.  Julian Hy, DO 11/09/22 10:08 AM [facility]  For contact information, see Amion. If no response to pager, please call PCCM consult pager. After hours, 7PM- 7AM, please call Elink.

## 2022-11-09 NOTE — Consult Note (Signed)
[place] [facility] Nurse Consult Note: Reason for Consult: Jtube and CT sites Wound type:NA Dressing procedure/placement/frequency: Bedside nursing requesting frequency for dressing changes for JP sites and Jtube site  [place] Nurse team will follow with you and see patient within 10 days for wound assessments.  Please notify [place] nurses of any acute changes in the wounds or any new areas of concern [place] MSN, [place], [place], [place]

## 2022-11-09 NOTE — Progress Notes (Signed)
[facility] Progress Note Patient Name: [name] [name] DOB: [birth year] MRN: [id number]   Date of Service  11/09/2022  HPI/Events of Note  Afib with RVR, marginal blood pressures, QTC .491.  eICU Interventions  Amiodarone bolus + infusion ordered        Frederik Pear 11/09/2022, 12:32 AM

## 2022-11-09 NOTE — Progress Notes (Signed)
[address]       [place] [address]             [phone]       EVENING ROUNDS  Sp Esophagectomy with post op leak Now with fevers and reintubated BP labile

## 2022-11-09 NOTE — Progress Notes (Signed)
[address]       [place] [address]             [phone]                 42 Days Post-Op Procedure(s) (LRB): ESOPHAGOGASTRODUODENOSCOPY (EGD) WITH PROPOFOL (N/A) ESOPHAGEAL STENT PLACEMENT (N/A) STENT REMOVAL   Events: Remains intubated No events _______________________________________________________________ Vitals: BP (!) 179/83   Pulse (!) 135   Temp 97.7 F (36.5 C) (Esophageal)   Resp (!) 37   Ht '5\' 10"'$  (1.778 m)   Wt 87.4 kg   SpO2 98%   BMI 27.65 kg/m  Filed Weights   11/07/22 0449 11/08/22 0313 11/09/22 0430  Weight: 81.6 kg 85.9 kg 87.4 kg     - Neuro: arousable  - Cardiovascular: sinus tach  Drips: levo 27.  Vaso off.  epi off    - Pulm: minimal CT output Vent Mode: PRVC FiO2 (%):  [90 %-100 %] 90 % Set Rate:  [32 bmp] 32 bmp Vt Set:  [550 mL] 550 mL PEEP:  [10 cmH20] 10 cmH20 Plateau Pressure:  [27 cmH20-36 cmH20] 34 cmH20  ABG    Component Value Date/Time   PHART 7.278 (L) 11/08/2022 0422   PCO2ART 59.4 (H) 11/08/2022 0422   PO2ART 85 11/08/2022 0422   HCO3 27.5 11/08/2022 0422   TCO2 29 11/08/2022 0422   ACIDBASEDEF 3.0 (H) 11/08/2022 0238   O2SAT 94 11/08/2022 0422    - Abd: ND - Extremity: warm  .Intake/Output      01/23 0701 01/24 0700 01/24 0701 01/25 0700   I.V. (mL/kg) 2454.1 (28.1) 97.3 (1.1)   Other     NG/GT 169.7 20   IV Piggyback 410.2    Total Intake(mL/kg) 3034 (34.7) 117.3 (1.3)   Urine (mL/kg/hr) 2866 (1.4)    Drains 15    Total Output 2881    Net +153 +117.3           _______________________________________________________________ Labs:    Latest Ref Rng & Units 11/08/2022    4:22 AM 11/08/2022    2:38 AM 11/07/2022    4:40 AM  CBC  Hemoglobin 13.0 - 17.0 g/dL 8.2  8.8  8.8   Hematocrit 39.0 - 52.0 % 24.0  26.0  26.0       Latest Ref Rng & Units 11/09/2022    4:16 AM 11/09/2022   12:32 AM 11/08/2022    1:30 PM  CMP  Glucose 70 - 99 mg/dL 186  204    BUN 8 - 23 mg/dL 77   72    Creatinine 0.61 - 1.24 mg/dL 0.90  0.97    Sodium 135 - 145 mmol/L 144  143  141   Potassium 3.5 - 5.1 mmol/L 4.3  4.5    Chloride 98 - 111 mmol/L 107  105    CO2 22 - 32 mmol/L 30  30    Calcium 8.9 - 10.3 mg/dL 10.5  10.2      CXR: -  _______________________________________________________________  Assessment and Plan: S/P Esophagectomy, with anastomotic leak  Neuro: on precedex CV: can wean levo.  On amio Sinus tach.   Pulm: wean vent support as tolerated Renal: creat down GI: NPO.  On trickle tube feeds Heme: stable ID: afebrile.  On Merrem and mica Endo: SSI Dispo: continue ICU care   Lajuana Matte 11/09/2022 8:38 AM

## 2022-11-09 NOTE — Progress Notes (Addendum)
Brief Nutrition Follow-up:  Pt remains sedated on vent support. Pressor requirements improving  Pt remains on trickle TF, Osmolite 1.5 at 20. Discussed with PCCM and plan to continue trickle TF only today. Pro-Source TF20 protein modular increased to q 4 hours to optimize protein delivery  Remains Constipated; MD is addressing. Pt receiving sorbitol again today in addition to miralax, colace. Reglan also ordered  Noted WOC RN re-evaluated today and placed orders for scheduled dressing changes to JP site and J-tube site.   Labs reviewed, CBGs improved with aggressive insulin regimen.   BUN (H), Creatinine wdl, calcium 10.5 (H)-no albumin for correction but corrected calcium likely higher. Sodium trending up, currently 144. Minimal free water from trickle TF. No scheduled free water flushes. Begin free water flushes; started at 200 mL q 6 hours  Continue to follow and assess for ability to titrate TF towards goal  Kerman Passey MS, RDN, LDN, CNSC Registered Dietitian 3 Clinical Nutrition RD Pager and On-Call Pager Number Located in [place]

## 2022-11-09 NOTE — Progress Notes (Addendum)
  Amiodarone Drug - Drug Interaction Consult Note  Recommendations: Monitor HR.  Note that fluconazole has been d/c'd but it does have a long half-life of 30h (last dose 1/22 1500); pt is in CICU and on a monitor.  Amiodarone is metabolized by the cytochrome P450 system and therefore has the potential to cause many drug interactions. Amiodarone has an average plasma half-life of 50 days (range 20 to 100 days).   There is potential for drug interactions to occur several weeks or months after stopping treatment and the onset of drug interactions may be slow after initiating amiodarone.   '[]'$  Statins: Increased risk of myopathy. Simvastatin- restrict dose to '20mg'$  daily. Other statins: counsel patients to report any muscle pain or weakness immediately.  '[]'$  Anticoagulants: Amiodarone can increase anticoagulant effect. Consider warfarin dose reduction. Patients should be monitored closely and the dose of anticoagulant altered accordingly, remembering that amiodarone levels take several weeks to stabilize.  '[]'$  Antiepileptics: Amiodarone can increase plasma concentration of phenytoin, the dose should be reduced. Note that small changes in phenytoin dose can result in large changes in levels. Monitor patient and counsel on signs of toxicity.  '[x]'$  Beta blockers: increased risk of bradycardia, AV block and myocardial depression. Sotalol - avoid concomitant use.  '[]'$   Calcium channel blockers (diltiazem and verapamil): increased risk of bradycardia, AV block and myocardial depression.  '[]'$   Cyclosporine: Amiodarone increases levels of cyclosporine. Reduced dose of cyclosporine is recommended.  '[]'$  Digoxin dose should be halved when amiodarone is started.  '[]'$  Diuretics: increased risk of cardiotoxicity if hypokalemia occurs.  '[]'$  Oral hypoglycemic agents (glyburide, glipizide, glimepiride): increased risk of hypoglycemia. Patient's glucose levels should be monitored closely when initiating amiodarone  therapy.   '[]'$  Drugs that prolong the QT interval:  Torsades de pointes risk may be increased with concurrent use - avoid if possible.  Monitor QTc, also keep magnesium/potassium WNL if concurrent therapy can't be avoided.  Antibiotics: e.g. fluoroquinolones, erythromycin.  Antiarrhythmics: e.g. quinidine, procainamide, disopyramide, sotalol.  Antipsychotics: e.g. phenothiazines, haloperidol.   Lithium, tricyclic antidepressants, and methadone.  Wynona Neat, PharmD, BCPS 11/09/2022 12:42 AM

## 2022-11-09 NOTE — Progress Notes (Signed)
[facility] Progress Note Patient Name: [name] [name] DOB: [birth year] MRN: [id number]   Date of Service  11/09/2022  HPI/Events of Note  Patient with sub-optimal sedation on the ventilator.  eICU Interventions  PRN iv Versed added.        Kerry Kass Kaneshia Cater 11/09/2022, 11:20 PM

## 2022-11-10 ENCOUNTER — Inpatient Hospital Stay (HOSPITAL_COMMUNITY): Payer: No Typology Code available for payment source

## 2022-11-10 DIAGNOSIS — A419 Sepsis, unspecified organism: Secondary | ICD-10-CM | POA: Diagnosis not present

## 2022-11-10 DIAGNOSIS — Z9911 Dependence on respirator [ventilator] status: Secondary | ICD-10-CM | POA: Diagnosis not present

## 2022-11-10 DIAGNOSIS — J80 Acute respiratory distress syndrome: Secondary | ICD-10-CM | POA: Diagnosis not present

## 2022-11-10 DIAGNOSIS — R6521 Severe sepsis with septic shock: Secondary | ICD-10-CM | POA: Diagnosis not present

## 2022-11-10 LAB — BODY FLUID CELL COUNT WITH DIFFERENTIAL
Eos, Fluid: 0 %
Lymphs, Fluid: 2 %
Monocyte-Macrophage-Serous Fluid: 21 % — ABNORMAL LOW (ref 50–90)
Neutrophil Count, Fluid: 77 % — ABNORMAL HIGH (ref 0–25)
Total Nucleated Cell Count, Fluid: 2164 cu mm — ABNORMAL HIGH (ref 0–1000)

## 2022-11-10 LAB — GLUCOSE, PLEURAL OR PERITONEAL FLUID: Glucose, Fluid: 123 mg/dL

## 2022-11-10 LAB — POCT I-STAT 7, (LYTES, BLD GAS, ICA,H+H)
Acid-Base Excess: 11 mmol/L — ABNORMAL HIGH (ref 0.0–2.0)
Acid-Base Excess: 11 mmol/L — ABNORMAL HIGH (ref 0.0–2.0)
Bicarbonate: 37.4 mmol/L — ABNORMAL HIGH (ref 20.0–28.0)
Bicarbonate: 38.7 mmol/L — ABNORMAL HIGH (ref 20.0–28.0)
Calcium, Ion: 1.68 mmol/L (ref 1.15–1.40)
Calcium, Ion: 1.54 mmol/L (ref 1.15–1.40)
HCT: 24 % — ABNORMAL LOW (ref 39.0–52.0)
HCT: 23 % — ABNORMAL LOW (ref 39.0–52.0)
Hemoglobin: 8.2 g/dL — ABNORMAL LOW (ref 13.0–17.0)
Hemoglobin: 7.8 g/dL — ABNORMAL LOW (ref 13.0–17.0)
O2 Saturation: 97 %
O2 Saturation: 96 %
Patient temperature: 33.6
Patient temperature: 36.9
Potassium: 5 mmol/L (ref 3.5–5.1)
Potassium: 3.6 mmol/L (ref 3.5–5.1)
Sodium: 150 mmol/L — ABNORMAL HIGH (ref 135–145)
Sodium: 149 mmol/L — ABNORMAL HIGH (ref 135–145)
TCO2: 39 mmol/L — ABNORMAL HIGH (ref 22–32)
TCO2: 41 mmol/L — ABNORMAL HIGH (ref 22–32)
pCO2 arterial: 55.1 mmHg — ABNORMAL HIGH (ref 32–48)
pCO2 arterial: 73.4 mmHg (ref 32–48)
pH, Arterial: 7.425 (ref 7.35–7.45)
pH, Arterial: 7.33 — ABNORMAL LOW (ref 7.35–7.45)
pO2, Arterial: 81 mmHg — ABNORMAL LOW (ref 83–108)
pO2, Arterial: 96 mmHg (ref 83–108)

## 2022-11-10 LAB — GLUCOSE, CAPILLARY
Glucose-Capillary: 142 mg/dL — ABNORMAL HIGH (ref 70–99)
Glucose-Capillary: 90 mg/dL (ref 70–99)
Glucose-Capillary: 118 mg/dL — ABNORMAL HIGH (ref 70–99)
Glucose-Capillary: 137 mg/dL — ABNORMAL HIGH (ref 70–99)
Glucose-Capillary: 120 mg/dL — ABNORMAL HIGH (ref 70–99)
Glucose-Capillary: 62 mg/dL — ABNORMAL LOW (ref 70–99)
Glucose-Capillary: 122 mg/dL — ABNORMAL HIGH (ref 70–99)
Glucose-Capillary: 112 mg/dL — ABNORMAL HIGH (ref 70–99)
Glucose-Capillary: 122 mg/dL — ABNORMAL HIGH (ref 70–99)
Glucose-Capillary: 34 mg/dL — CL (ref 70–99)
Glucose-Capillary: 80 mg/dL (ref 70–99)
Glucose-Capillary: 111 mg/dL — ABNORMAL HIGH (ref 70–99)
Glucose-Capillary: 103 mg/dL — ABNORMAL HIGH (ref 70–99)

## 2022-11-10 LAB — LACTATE DEHYDROGENASE, PLEURAL OR PERITONEAL FLUID: LD, Fluid: 749 U/L — ABNORMAL HIGH (ref 3–23)

## 2022-11-10 LAB — CBC
HCT: 25.8 % — ABNORMAL LOW (ref 39.0–52.0)
Hemoglobin: 7.5 g/dL — ABNORMAL LOW (ref 13.0–17.0)
MCH: 29.9 pg (ref 26.0–34.0)
MCHC: 29.1 g/dL — ABNORMAL LOW (ref 30.0–36.0)
MCV: 102.8 fL — ABNORMAL HIGH (ref 80.0–100.0)
Platelets: 155 10*3/uL (ref 150–400)
RBC: 2.51 MIL/uL — ABNORMAL LOW (ref 4.22–5.81)
RDW: 16 % — ABNORMAL HIGH (ref 11.5–15.5)
WBC: 19.8 10*3/uL — ABNORMAL HIGH (ref 4.0–10.5)
nRBC: 1.9 % — ABNORMAL HIGH (ref 0.0–0.2)

## 2022-11-10 LAB — BASIC METABOLIC PANEL
Anion gap: 6 (ref 5–15)
BUN: 80 mg/dL — ABNORMAL HIGH (ref 8–23)
CO2: 32 mmol/L (ref 22–32)
Calcium: 10.6 mg/dL — ABNORMAL HIGH (ref 8.9–10.3)
Chloride: 109 mmol/L (ref 98–111)
Creatinine, Ser: 0.79 mg/dL (ref 0.61–1.24)
GFR, Estimated: >60 mL/min (ref >60)
Glucose, Bld: 141 mg/dL — ABNORMAL HIGH (ref 70–99)
Potassium: 4.6 mmol/L (ref 3.5–5.1)
Sodium: 147 mmol/L — ABNORMAL HIGH (ref 135–145)

## 2022-11-10 LAB — LACTATE DEHYDROGENASE: LDH: 232 U/L — ABNORMAL HIGH (ref 98–192)

## 2022-11-10 LAB — PROTEIN, PLEURAL OR PERITONEAL FLUID: Total protein, fluid: <3 g/dL

## 2022-11-10 LAB — PROTEIN, TOTAL: Total Protein: 5.1 g/dL — ABNORMAL LOW (ref 6.5–8.1)

## 2022-11-10 LAB — SODIUM: Sodium: 151 mmol/L — ABNORMAL HIGH (ref 135–145)

## 2022-11-10 LAB — LACTIC ACID, PLASMA: Lactic Acid, Venous: 2.5 mmol/L (ref 0.5–1.9)

## 2022-11-10 MED ORDER — SENNA 8.6 MG PO TABS
1.0000 | ORAL_TABLET | Freq: Every day | ORAL | Status: DC
  Administered 2022-11-10 – 2022-11-14 (×4): 8.6 mg
  Filled 2022-11-10 (×4): qty 1

## 2022-11-10 MED ORDER — SORBITOL 70 % SOLN
960.0000 mL | TOPICAL_OIL | Freq: Once | ORAL | Status: AC
  Administered 2022-11-10: 960 mL via RECTAL
  Filled 2022-11-10: qty 240

## 2022-11-10 MED ORDER — ORAL CARE MOUTH RINSE
15.0000 mL | OROMUCOSAL | Status: DC
  Administered 2022-11-10 – 2022-11-19 (×105): 15 mL via OROMUCOSAL

## 2022-11-10 MED ORDER — PROSOURCE TF20 ENFIT COMPATIBL EN LIQD
60.0000 mL | Freq: Three times a day (TID) | ENTERAL | Status: DC
  Administered 2022-11-10 – 2022-11-11 (×3): 60 mL
  Filled 2022-11-10 (×2): qty 60

## 2022-11-10 MED ORDER — OSMOLITE 1.5 CAL PO LIQD
1000.0000 mL | ORAL | Status: DC
  Administered 2022-11-10 – 2022-11-11 (×3): 1000 mL

## 2022-11-10 MED ORDER — FUROSEMIDE 10 MG/ML IJ SOLN: 40.0000 mg | Freq: Once | INTRAMUSCULAR | Status: DC

## 2022-11-10 MED ORDER — DEXTROSE 50 % IV SOLN
INTRAVENOUS | Status: AC
  Filled 2022-11-10: qty 50

## 2022-11-10 MED ORDER — FUROSEMIDE 10 MG/ML IJ SOLN
40.0000 mg | Freq: Two times a day (BID) | INTRAMUSCULAR | Status: DC
  Administered 2022-11-10 – 2022-11-13 (×7): 40 mg via INTRAVENOUS
  Filled 2022-11-10 (×7): qty 4

## 2022-11-10 MED ORDER — ORAL CARE MOUTH RINSE
15.0000 mL | OROMUCOSAL | Status: DC | PRN
  Administered 2022-11-10: 15 mL via OROMUCOSAL

## 2022-11-10 MED ORDER — POLYVINYL ALCOHOL 1.4 % OP SOLN
1.0000 [drp] | OPHTHALMIC | Status: DC | PRN
  Administered 2022-11-10 – 2022-11-18 (×6): 1 [drp] via OPHTHALMIC
  Filled 2022-11-10: qty 15

## 2022-11-10 MED ORDER — DEXTROSE 50 % IV SOLN
INTRAVENOUS | Status: AC
  Administered 2022-11-10: 12.5 g via INTRAVENOUS
  Filled 2022-11-10: qty 50

## 2022-11-10 MED ORDER — OSMOLITE 1.5 CAL PO LIQD: 1000.0000 mL | ORAL | Status: DC

## 2022-11-10 MED ORDER — INSULIN GLARGINE-YFGN 100 UNIT/ML ~~LOC~~ SOLN
24.0000 [IU] | Freq: Two times a day (BID) | SUBCUTANEOUS | Status: DC
  Administered 2022-11-10: 24 [IU] via SUBCUTANEOUS
  Filled 2022-11-10 (×2): qty 0.24

## 2022-11-10 MED ORDER — SODIUM CHLORIDE 0.9% FLUSH: 10.0000 mL | Freq: Three times a day (TID) | INTRAVENOUS | Status: DC

## 2022-11-10 MED ORDER — DEXTROSE 50 % IV SOLN: 12.5000 g | INTRAVENOUS | Status: AC

## 2022-11-10 MED ORDER — FREE WATER
200.0000 mL | Status: DC
  Administered 2022-11-10 – 2022-11-11 (×6): 200 mL

## 2022-11-10 NOTE — Progress Notes (Signed)
[address]       [place] [address]             [phone]                 43 Days Post-Op Procedure(s) (LRB): ESOPHAGOGASTRODUODENOSCOPY (EGD) WITH PROPOFOL (N/A) ESOPHAGEAL STENT PLACEMENT (N/A) STENT REMOVAL   Events: Remains intubated No events _______________________________________________________________ Vitals: BP 129/65   Pulse 68   Temp 97.7 F (36.5 C) (Axillary)   Resp (!) 29   Ht '5\' 10"'$  (1.778 m)   Wt 90.3 kg   SpO2 100%   BMI 28.56 kg/m  Filed Weights   11/08/22 0313 11/09/22 0430 11/10/22 0430  Weight: 85.9 kg 87.4 kg 90.3 kg     - Neuro: arousable  - Cardiovascular: sinus tach  Drips: levo 9.  Vaso off.  epi off    - Pulm: minimal CT output Vent Mode: PRVC FiO2 (%):  [80 %-90 %] 80 % Set Rate:  [32 bmp] 32 bmp Vt Set:  [550 mL] 550 mL PEEP:  [10 cmH20] 10 cmH20 Plateau Pressure:  [17 cmH20-32 cmH20] 31 cmH20  ABG    Component Value Date/Time   PHART 7.425 11/10/2022 0728   PCO2ART 55.1 (H) 11/10/2022 0728   PO2ART 81 (L) 11/10/2022 0728   HCO3 37.4 (H) 11/10/2022 0728   TCO2 39 (H) 11/10/2022 0728   ACIDBASEDEF 3.0 (H) 11/08/2022 0238   O2SAT 97 11/10/2022 0728    - Abd: ND - Extremity: warm  .Intake/Output      01/24 0701 01/25 0700 01/25 0701 01/26 0700   I.V. (mL/kg) 1698.2 (18.8)    Other 0    NG/GT 400    IV Piggyback 408.1    Total Intake(mL/kg) 2506.3 (27.8)    Urine (mL/kg/hr) 2150 (1)    Drains 0    Total Output 2150    Net +356.3            _______________________________________________________________ Labs:    Latest Ref Rng & Units 11/10/2022    7:28 AM 11/10/2022    7:27 AM 11/08/2022    4:22 AM  CBC  WBC 4.0 - 10.5 K/uL  19.8    Hemoglobin 13.0 - 17.0 g/dL 8.2  7.5  8.2   Hematocrit 39.0 - 52.0 % 24.0  25.8  24.0   Platelets 150 - 400 K/uL  155        Latest Ref Rng & Units 11/10/2022    7:28 AM 11/10/2022    5:00 AM 11/09/2022    4:16 AM  CMP  Glucose 70 - 99 mg/dL   141  186   BUN 8 - 23 mg/dL  80  77   Creatinine 0.61 - 1.24 mg/dL  0.79  0.90   Sodium 135 - 145 mmol/L 150  147  144   Potassium 3.5 - 5.1 mmol/L 5.0  4.6  4.3   Chloride 98 - 111 mmol/L  109  107   CO2 22 - 32 mmol/L  32  30   Calcium 8.9 - 10.3 mg/dL  10.6  10.5     CXR: -  _______________________________________________________________  Assessment and Plan: S/P Esophagectomy, with anastomotic leak  Neuro: on precedex CV: can wean levo.  On amio Sinus tach.   Pulm: wean vent support as tolerated Renal: creat down GI: NPO.  On trickle tube feeds Heme: stable ID: afebrile.  On Merrem and mica Endo: SSI Dispo: continue ICU care  Lajuana Matte 11/10/2022 8:38 AM

## 2022-11-10 NOTE — Progress Notes (Signed)
Pt noted to be flushed and warm to the touch. Esophageal probe temp noted to be 37C and axillary temp 98.68F. Ice packs applied to underarms and cool washcloth to pt's forehead. Pt becoming agitated and restless. Fentanyl bolus given.

## 2022-11-10 NOTE — Progress Notes (Addendum)
Levophed titrated outside of ordered parameters per verbal direction from [facility], ACNP-BC during bedside procedure.

## 2022-11-10 NOTE — Progress Notes (Signed)
Hypoglycemic Event  CBG: 62  Treatment: D50 25 mL (12.5 gm)  Symptoms: None  Follow-up CBG: Time:1629 CBG Result:120  Possible Reasons for Event: Unknown  Comments/MD notified: Dr. Ramond Dial, [place] B

## 2022-11-10 NOTE — Plan of Care (Signed)
  Problem: Elimination: Goal: Will not experience complications related to bowel motility Outcome: Not Progressing   Problem: Bowel/Gastric: Goal: Gastrointestinal status for postoperative course will improve Outcome: Not Progressing   Problem: Clinical Measurements: Goal: Postoperative complications will be avoided or minimized Outcome: Not Progressing   No BM since 1/16 despite aggressive bowel regimen.

## 2022-11-10 NOTE — Procedures (Addendum)
Insertion of Chest Tube Procedure Note  [name] [name]  [id number]  02/14/1961  Date:11/10/22  Time:1:20 PM    Provider Performing: Clementeen Graham   Procedure: Chest Tube Insertion (91791)  Indication(s) Effusion  Consent Risks of the procedure as well as the alternatives and risks of each were explained to the patient and/or caregiver.  Consent for the procedure was obtained and is signed in the bedside chart  Anesthesia Topical only with 1% lidocaine    Time Out Verified patient identification, verified procedure, site/side was marked, verified correct patient position, special equipment/implants available, medications/allergies/relevant history reviewed, required imaging and test results available.   Sterile Technique Maximal sterile technique including full sterile barrier drape, hand hygiene, sterile gown, sterile gloves, mask, hair covering, sterile ultrasound probe cover (if used).   Procedure Description Ultrasound used to identify appropriate pleural anatomy for placement and overlying skin marked. Area of placement cleaned and draped in sterile fashion.  A 14 French pleural catheter was placed into the right pleural space using thoracentesis kit. Catheter fed over guidewire Appropriate return of 473m orange colored fluid was obtained, however did have unexpected aspiration of air so then the thoracentesis catheter was sutured in place was connected to atrium and placed on -20 cm H2O wall suction.   Complications/Tolerance None; patient tolerated the procedure well. Chest X-ray is ordered to verify placement.   EBL Minimal  Specimen(s) Fluid sent for analysis   PErick ColaceACNP-BC LMortonPager # [phone] # [phone] no answer

## 2022-11-10 NOTE — Progress Notes (Signed)
Patient ID: [name] [name], male   DOB: 27-Nov-1960, 62 y.o.   MRN: [id number]  TCTS Evening Rounds:  Hemodynamically stable on NE 5.  Vent down slightly to 70% FiO2  UO good.  CCM put tube in right pleural space today and drained 400 cc fluid.

## 2022-11-10 NOTE — Progress Notes (Signed)
NAME:  [name] [name], MRN:  [id number], DOB:  [birth year], LOS: 45 ADMISSION DATE:  10/04/2022, CONSULTATION DATE: 11/06/2022 REFERRING MD: Dr. 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  Overnight patient became short of breath, started spiking fever so he was transferred to ICU.  This morning patient was started spiking fever to 103, remained tachycardic and tachypneic, PCCM was called for help evaluation medical management  Pertinent  Medical History   Past Medical History:  Diagnosis Date   Cancer [facility])    Esophageal Cancer   History of radiation therapy    Esophagus- 05/12/22-06/22/22- Dr. Gery Pray   Port-A-Cath in place 05/11/2022     Significant Hospital Events: Including procedures, antibiotic start and stop dates in addition to other pertinent events   12/1 admitted for drainage from empyema 12/13 EGD with stent placement.  Interim History / Subjective:  Remains on pressors (down to just NE) vent.  Afebrile overnight.   Objective   Blood pressure (!) 188/103, pulse (!) 104, temperature 98.7 F (37.1 C), temperature source Axillary, resp. rate (!) 33, height '5\' 10"'$  (1.778 m), weight 90.3 kg, SpO2 98 %.    Vent Mode: PRVC FiO2 (%):  [80 %-90 %] 80 % Set Rate:  [32 bmp] 32 bmp Vt Set:  [550 mL] 550 mL PEEP:  [10 cmH20] 10 cmH20 Plateau Pressure:  [17 cmH20-32 cmH20] 31 cmH20   Intake/Output Summary (Last 24 hours) at 11/10/2022 4259 Last data filed at 11/10/2022 0500 Gross per 24 hour  Intake 2506.27 ml  Output 2150 ml  Net 356.27 ml    Filed Weights   11/08/22 0313 11/09/22 0430 11/10/22 0430  Weight: 85.9 kg 87.4 kg 90.3 kg    Examination:  Physical exam: General: critically ill  appearing man lying in bed in NAD HEENT:  [place]/AT eyes anicteric, ETT Neuro: RASS -1, cough reflex intact Chest: 2 bulb drains on R, rhales bilaterally. Heart: S1S2, tachycardic, reg rhythm Abdomen: soft, NT Skin: no rashes, warm & dry  ABG    Component Value Date/Time   PHART 7.425 11/10/2022 0728   PCO2ART 55.1 (H) 11/10/2022 0728   PO2ART 81 (L) 11/10/2022 0728   HCO3 37.4 (H) 11/10/2022 0728   TCO2 39 (H) 11/10/2022 0728   ACIDBASEDEF 3.0 (H) 11/08/2022 0238   O2SAT 97 11/10/2022 0728    Na+ 147 K+ 4.6 BUN 80 Cr 0.79 LA 2.5 BG controlled on insulin gtt Blood cultures: NGTD Resp culture: E coli, R ampicillin-sulbactam, gent, trim-sulfa  Resolved Hospital Problem list     Assessment & Plan:  Acute hypoxic and hypercapnic respiratory failure E coli Aspiration pneumonia-LUL, RLL with ARDS -LTVV, Pplat <30 and DP <15 -VAP prevention protocol -PAD protocol -daily SAT & SBT -lasix BID  Septic shock due to bilateral multifocal pneumonia Right-sided empyema s/p VATS, currently with chest tube placed -con't NE, wean as able. Can decrease steroids when off pressors -meropenem, micafungin. Fevers did not defervesce until micafungin started.  -ok to advance TF today  Acute septic encephalopathy -antibiotics, pressors for sepsis -PAD protocol  Moderate protein energy malnutrition -advance TF  Constipation; ileus PTA -smog enema -daily miralax and senna  -con't reglan  Afib -amiodarone gtt until off pressors -hold off on heparin gtt due to concern that azotemia w/o Cr rise could  be a/w GIB  Hyponatremia> now hypernatremic Acute kidney injury likely due to septic ATN -strict I/O -renally dose meds, avoid nephrotoxic meds -increase FWF to 200cc q4h  Anemia of chronic disease -transfuse for hb <7 or hemodynamically significant bleeding -CBC today  Azotemia -check FOBT  Adenocarcinoma esophagus s/p esophagectomy, adjuvant chemoradiation Anastomotic leak s/p  esophageal stent -management per TCTS  Hyperglycemia due to acute illness, steroids. A1c is 4.3 -decrease glargine to 24 units BID -insulin aspart 5 units q4h -SSI PRN -moderate scale -goal BG 140-180   Discussed during rounds with pharmacy and TCTS.  Best Practice (right click and "Reselect all SmartList Selections" daily)   Diet/type: tubefeeds and NPO DVT prophylaxis: prophylactic heparin  GI prophylaxis: PPI Lines: Central line and yes and it is still needed-picc Foley:  N/A Code Status:  DNR Last date of multidisciplinary goals of care discussion [1/21: With the patient's wife, please see Ipal note]  Labs   CBC: Recent Labs  Lab 11/05/22 0544 11/06/22 0230 11/06/22 0231 11/06/22 1119 11/07/22 0440 11/08/22 0238 11/08/22 0422  WBC 8.0 6.5  --   --   --   --   --   HGB 7.8* 9.5* 9.9* 10.2* 8.8* 8.8* 8.2*  HCT 24.2* 30.3* 29.0* 30.0* 26.0* 26.0* 24.0*  MCV 96.0 97.7  --   --   --   --   --   PLT 291 377  --   --   --   --   --      Basic Metabolic Panel: Recent Labs  Lab 11/06/22 0230 11/06/22 0231 11/07/22 0410 11/07/22 0440 11/08/22 0119 11/08/22 0238 11/08/22 0422 11/08/22 1330 11/09/22 0032 11/09/22 0416 11/10/22 0500  NA 134*   < > 132*   < > 138 139 139 141 143 144 147*  K 4.2   < > 4.8   < > 4.8 5.2* 4.9  --  4.5 4.3 4.6  CL 95*  --  99  --  101  --   --   --  105 107 109  CO2 28  --  26  --  23  --   --   --  30 30 32  GLUCOSE 329*  --  240*  --  320*  --   --   --  204* 186* 141*  BUN 24*  --  46*  --  63*  --   --   --  72* 77* 80*  CREATININE 0.78  --  1.34*  --  1.47*  --   --   --  0.97 0.90 0.79  CALCIUM 9.7  --  9.0  --  9.5  --   --   --  10.2 10.5* 10.6*  MG 1.6*  --  2.4  --   --   --   --   --  2.6*  --   --    < > = values in this interval not displayed.    GFR: Estimated Creatinine Clearance: 106.8 mL/min (by C-G formula based on SCr of 0.79 mg/dL). Recent Labs  Lab 11/05/22 0544 11/06/22 0230 11/06/22 2023 11/07/22 1554  11/08/22 0759 11/09/22 0416 11/10/22 0437  WBC 8.0 6.5  --   --   --   --   --   LATICACIDVEN  --   --    < > 5.0* 5.7* 3.0* 2.5*   < > = values in this interval not displayed.     This patient is critically ill  with multiple organ system failure which requires frequent high complexity decision making, assessment, support, evaluation, and titration of therapies. This was completed through the application of advanced monitoring technologies and extensive interpretation of multiple databases. During this encounter critical care time was devoted to patient care services described in this note for 36 minutes.  Julian Hy, DO 11/10/22 8:18 AM [facility]  For contact information, see Amion. If no response to pager, please call PCCM consult pager. After hours, 7PM- 7AM, please call Elink.

## 2022-11-10 NOTE — Progress Notes (Signed)
Hypoglycemia - Discontinue long-acting insulin for tonight - Every hour Accu-Cheks x 6 hrs  D/w RN Josiah Lobo.  Julian Hy, DO 11/10/22 4:49 PM [facility]  For contact information, see Amion. If no response to pager, please call PCCM consult pager. After hours, 7PM- 7AM, please call Elink.

## 2022-11-10 NOTE — Progress Notes (Addendum)
Nutrition Follow-up  DOCUMENTATION CODES:   Severe malnutrition in context of chronic illness  INTERVENTION:   Tube Feeding via J-tube:  Osmolite 1.5 at 75 ml/hr Increase to 30 ml/hr; titrate by 10 mL q 8 hours until goal rate of 75 ml/hr Pro-Source TF20 60 mL daily This provides 2780 kcal, 133 grams of protein, and 1463 ml of H2O.   Continue Pro-Source TF20 supplementation with plans to d/c once TF at goal   Noted increased free water flushes today by Dr. Carlis Abbott due to hypernatremia. Pt will likely require further increase in free water over the next few days due to free water deficit  Total free water once TF at goal with increased free water flushes today provides 2663 mL of free water  Remains constipated; being addressed by PCCM  Recommend monitoring output from around J-tube as TF is being advanced to goal as TF increase may increase output   NUTRITION DIAGNOSIS:   Severe Malnutrition related to chronic illness (esophageal cancer) as evidenced by severe fat depletion, severe muscle depletion, percent weight loss.  Being addressed via TF   GOAL:   Patient will meet greater than or equal to 90% of their needs  Progressing  MONITOR:   Vent status, Labs, Weight trends, TF tolerance, Skin, I & O's  REASON FOR ASSESSMENT:   Consult Enteral/tube feeding initiation and management  ASSESSMENT:   62 y.o. male admits related to SOB and generalized weakness. PMH includes esophageal cancer. Pt is currently receiving medical management for fevers and failure to thrive.  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 Currently off pressors  Tolerating trickle TF via J-tube. Discussed with Dr. Carlis Abbott and plan to begin titration to goal today  J-tube still leaking some per RN; gauze last changed overnight and bedside RN this AM has not been able to visualize  RD started free water flush of 200 mL q 6 hours; sodium continues to trend up, now hypernatremic with free water deficit. MD increased free water to 200 mL q 4 hours; may need to add free water via IV if free water deficit worsens  Noted hypercalcemia persists; ionized calcium checked today and also elevated  Repeat NFPE with worsening muscle wasting. Last NFPE performed on 09/29/22  No BM since last assessed-unsure when pt actually had a BM; SMOG enema today. Remains on reglan, miralax, senna  Weight trending up; nonpitting edema present  CBGs have improved, 90-155 (ICU goal 140-180), MD adjusted insulin regimen today  Labs: sodium 150 (H), Creatinine wdl, BUN 80 (H), ionized calcium 1.66 (H) Meds: lasix, ss novolog, semglee, ss novolog  NUTRITION - FOCUSED PHYSICAL EXAM:  Flowsheet Row Most Recent Value  Orbital Region Severe depletion  Upper Arm Region Severe depletion  Thoracic and Lumbar Region Severe depletion  Buccal Region Severe  depletion  Temple Region Severe depletion  Clavicle Bone Region Severe depletion  Clavicle and Acromion Bone Region Severe depletion  Scapular Bone Region Severe depletion  Dorsal Hand Unable to assess  Patellar Region Severe depletion  Anterior Thigh Region Severe depletion  Posterior Calf Region Severe depletion  Edema (RD Assessment)  Mild  Hair Reviewed  Eyes Reviewed  Mouth Reviewed  Skin Reviewed  Nails Reviewed       Diet Order:   Diet Order             Diet NPO time specified  Diet effective now                   EDUCATION NEEDS:   No education needs have been identified at this time  Skin:  Skin Assessment: Skin Integrity Issues: Skin Integrity Issues:: Incisions Incisions: pain at J-tube site, WOC RN consulted; chest incisions  Last BM:  11/01/22  Height:   Ht Readings from Last 1 Encounters:  09/25/2022 '5\' 10"'$  (1.778 m)    Weight:   Wt Readings from Last 1 Encounters:  11/10/22 90.3 kg    BMI:  Body mass index is 28.56 kg/m.  Estimated Nutritional Needs:   Kcal:  5462-7035 kcals  Protein:  135-155 g  Fluid:  >/= 2 L   Kerman Passey MS, RDN, LDN, CNSC Registered Dietitian 3 Clinical Nutrition RD Pager and On-Call Pager Number Located in [place]

## 2022-11-11 ENCOUNTER — Inpatient Hospital Stay (HOSPITAL_COMMUNITY): Payer: No Typology Code available for payment source

## 2022-11-11 DIAGNOSIS — J9601 Acute respiratory failure with hypoxia: Secondary | ICD-10-CM | POA: Diagnosis not present

## 2022-11-11 DIAGNOSIS — J9602 Acute respiratory failure with hypercapnia: Secondary | ICD-10-CM | POA: Diagnosis not present

## 2022-11-11 LAB — CBC
HCT: 26.2 % — ABNORMAL LOW (ref 39.0–52.0)
Hemoglobin: 7.5 g/dL — ABNORMAL LOW (ref 13.0–17.0)
MCH: 30 pg (ref 26.0–34.0)
MCHC: 28.6 g/dL — ABNORMAL LOW (ref 30.0–36.0)
MCV: 104.8 fL — ABNORMAL HIGH (ref 80.0–100.0)
Platelets: 139 10*3/uL — ABNORMAL LOW (ref 150–400)
RBC: 2.5 MIL/uL — ABNORMAL LOW (ref 4.22–5.81)
RDW: 16.3 % — ABNORMAL HIGH (ref 11.5–15.5)
WBC: 17.2 10*3/uL — ABNORMAL HIGH (ref 4.0–10.5)
nRBC: 2 % — ABNORMAL HIGH (ref 0.0–0.2)

## 2022-11-11 LAB — POCT I-STAT 7, (LYTES, BLD GAS, ICA,H+H)
Acid-Base Excess: 11 mmol/L — ABNORMAL HIGH (ref 0.0–2.0)
Acid-Base Excess: 11 mmol/L — ABNORMAL HIGH (ref 0.0–2.0)
Acid-Base Excess: 12 mmol/L — ABNORMAL HIGH (ref 0.0–2.0)
Bicarbonate: 38.5 mmol/L — ABNORMAL HIGH (ref 20.0–28.0)
Bicarbonate: 38 mmol/L — ABNORMAL HIGH (ref 20.0–28.0)
Bicarbonate: 39.2 mmol/L — ABNORMAL HIGH (ref 20.0–28.0)
Calcium, Ion: 1.6 mmol/L (ref 1.15–1.40)
Calcium, Ion: 1.59 mmol/L (ref 1.15–1.40)
Calcium, Ion: 1.62 mmol/L (ref 1.15–1.40)
HCT: 27 % — ABNORMAL LOW (ref 39.0–52.0)
HCT: 24 % — ABNORMAL LOW (ref 39.0–52.0)
HCT: 25 % — ABNORMAL LOW (ref 39.0–52.0)
Hemoglobin: 9.2 g/dL — ABNORMAL LOW (ref 13.0–17.0)
Hemoglobin: 8.2 g/dL — ABNORMAL LOW (ref 13.0–17.0)
Hemoglobin: 8.5 g/dL — ABNORMAL LOW (ref 13.0–17.0)
O2 Saturation: 94 %
O2 Saturation: 90 %
O2 Saturation: 90 %
Patient temperature: 36.5
Patient temperature: 36.5
Patient temperature: 36.5
Potassium: 3.8 mmol/L (ref 3.5–5.1)
Potassium: 3.5 mmol/L (ref 3.5–5.1)
Potassium: 4 mmol/L (ref 3.5–5.1)
Sodium: 154 mmol/L — ABNORMAL HIGH (ref 135–145)
Sodium: 154 mmol/L — ABNORMAL HIGH (ref 135–145)
Sodium: 152 mmol/L — ABNORMAL HIGH (ref 135–145)
TCO2: 41 mmol/L — ABNORMAL HIGH (ref 22–32)
TCO2: 40 mmol/L — ABNORMAL HIGH (ref 22–32)
TCO2: 41 mmol/L — ABNORMAL HIGH (ref 22–32)
pCO2 arterial: 72 mmHg (ref 32–48)
pCO2 arterial: 69.7 mmHg (ref 32–48)
pCO2 arterial: 70.9 mmHg (ref 32–48)
pH, Arterial: 7.334 — ABNORMAL LOW (ref 7.35–7.45)
pH, Arterial: 7.342 — ABNORMAL LOW (ref 7.35–7.45)
pH, Arterial: 7.349 — ABNORMAL LOW (ref 7.35–7.45)
pO2, Arterial: 79 mmHg — ABNORMAL LOW (ref 83–108)
pO2, Arterial: 62 mmHg — ABNORMAL LOW (ref 83–108)
pO2, Arterial: 64 mmHg — ABNORMAL LOW (ref 83–108)

## 2022-11-11 LAB — CULTURE, BLOOD (ROUTINE X 2)
Culture: NO GROWTH
Culture: NO GROWTH
Special Requests: ADEQUATE
Special Requests: ADEQUATE

## 2022-11-11 LAB — BASIC METABOLIC PANEL
Anion gap: 7 (ref 5–15)
BUN: 70 mg/dL — ABNORMAL HIGH (ref 8–23)
CO2: 34 mmol/L — ABNORMAL HIGH (ref 22–32)
Calcium: 10.4 mg/dL — ABNORMAL HIGH (ref 8.9–10.3)
Chloride: 110 mmol/L (ref 98–111)
Creatinine, Ser: 0.73 mg/dL (ref 0.61–1.24)
GFR, Estimated: >60 mL/min (ref >60)
Glucose, Bld: 155 mg/dL — ABNORMAL HIGH (ref 70–99)
Potassium: 4 mmol/L (ref 3.5–5.1)
Sodium: 151 mmol/L — ABNORMAL HIGH (ref 135–145)

## 2022-11-11 LAB — GLUCOSE, CAPILLARY
Glucose-Capillary: 118 mg/dL — ABNORMAL HIGH (ref 70–99)
Glucose-Capillary: 123 mg/dL — ABNORMAL HIGH (ref 70–99)
Glucose-Capillary: 126 mg/dL — ABNORMAL HIGH (ref 70–99)
Glucose-Capillary: 128 mg/dL — ABNORMAL HIGH (ref 70–99)
Glucose-Capillary: 150 mg/dL — ABNORMAL HIGH (ref 70–99)
Glucose-Capillary: 72 mg/dL (ref 70–99)

## 2022-11-11 LAB — BRAIN NATRIURETIC PEPTIDE: B Natriuretic Peptide: 481.5 pg/mL — ABNORMAL HIGH (ref 0.0–100.0)

## 2022-11-11 LAB — PHOSPHORUS: Phosphorus: 2.9 mg/dL (ref 2.5–4.6)

## 2022-11-11 LAB — MAGNESIUM: Magnesium: 2.2 mg/dL (ref 1.7–2.4)

## 2022-11-11 MED ORDER — MIDAZOLAM BOLUS VIA INFUSION: 0.0000 mg | INTRAVENOUS | Status: DC | PRN

## 2022-11-11 MED ORDER — TRACE MINERALS CU-MN-SE-ZN 300-55-60-3000 MCG/ML IV SOLN
INTRAVENOUS | Status: AC
  Filled 2022-11-11: qty 691.2

## 2022-11-11 MED ORDER — FREE WATER
200.0000 mL | Status: DC
  Administered 2022-11-11 – 2022-11-12 (×11): 200 mL

## 2022-11-11 MED ORDER — MIDAZOLAM-SODIUM CHLORIDE 100-0.9 MG/100ML-% IV SOLN
0.0000 mg/h | INTRAVENOUS | Status: DC
  Administered 2022-11-11: 2 mg/h via INTRAVENOUS
  Administered 2022-11-12: 4 mg/h via INTRAVENOUS
  Administered 2022-11-13 (×2): 6 mg/h via INTRAVENOUS
  Administered 2022-11-14: 4 mg/h via INTRAVENOUS
  Filled 2022-11-11 (×5): qty 100

## 2022-11-11 MED ORDER — MIDAZOLAM HCL 2 MG/2ML IJ SOLN
1.0000 mg | INTRAMUSCULAR | Status: AC | PRN
  Filled 2022-11-11: qty 2

## 2022-11-11 MED ORDER — METHYLNALTREXONE BROMIDE 12 MG/0.6ML ~~LOC~~ SOLN
12.0000 mg | Freq: Once | SUBCUTANEOUS | Status: AC
  Administered 2022-11-11: 12 mg via SUBCUTANEOUS
  Filled 2022-11-11: qty 0.6

## 2022-11-11 NOTE — Progress Notes (Signed)
CXR reviewed Possible very small left apical ptx F/u film unchanged and really difficult to see it at all.  No airleak noted on [place], no further tidal noted.  Has excellent sliding lung sigh noted via POC [place] Plan Dc left chest tube Watch closely for worsening of PTX however I do not think the current CT is doing anything for the small apical ptx as it is   Erick Colace ACNP-BC [place] Pager # [phone] OR # [phone] if no answer

## 2022-11-11 NOTE — Progress Notes (Signed)
[address]       [place] [address]             [phone]                 44 Days Post-Op Procedure(s) (LRB): ESOPHAGOGASTRODUODENOSCOPY (EGD) WITH PROPOFOL (N/A) ESOPHAGEAL STENT PLACEMENT (N/A) STENT REMOVAL   Events: Remains intubated Right sided chest tube placed. _______________________________________________________________ Vitals: BP (!) 186/81   Pulse 97   Temp 98.5 F (36.9 C) (Axillary)   Resp (!) 36   Ht _0  (1.778 m)   Wt 83.3 kg   SpO2 100%   BMI 26.35 kg/m  Filed Weights   11/09/22 0430 11/10/22 0430 11/11/22 0500  Weight: 87.4 kg 90.3 kg 83.3 kg     - Neuro: arousable  - Cardiovascular: sinus   Drips: levo off.  Vaso off.  epi off    - Pulm: minimal CT output Vent Mode: PRVC FiO2 (%):  [60 %-70 %] 60 % Set Rate:  [32 bmp] 32 bmp Vt Set:  [500 mL] 500 mL PEEP:  [10 cmH20] 10 cmH20 Plateau Pressure:  [26 cmH20-36 cmH20] 32 cmH20  ABG    Component Value Date/Time   PHART 7.330 (L) 11/10/2022 2239   PCO2ART 73.4 (HH) 11/10/2022 2239   PO2ART 96 11/10/2022 2239   HCO3 38.7 (H) 11/10/2022 2239   TCO2 41 (H) 11/10/2022 2239   ACIDBASEDEF 3.0 (H) 11/08/2022 0238   O2SAT 96 11/10/2022 2239    - Abd: ND - Extremity: warm  .Intake/Output      01/25 0701 01/26 0700 01/26 0701 01/27 0700   I.V. (mL/kg) 1626.7 (19.5)    Other 215    NG/GT 3297.3    IV Piggyback 408    Total Intake(mL/kg) 5547 (66.6)    Urine (mL/kg/hr) 2950 (1.5)    Drains 10    Stool 0    Chest Tube 580    Total Output 3540    Net +2007         Urine Occurrence 1 x    Stool Occurrence 1 x       _______________________________________________________________ Labs:    Latest Ref Rng & Units 11/11/2022    4:16 AM 11/10/2022   10:39 PM 11/10/2022    7:28 AM  CBC  WBC 4.0 - 10.5 K/uL 17.2     Hemoglobin 13.0 - 17.0 g/dL 7.5  7.8  8.2   Hematocrit 39.0 - 52.0 % 26.2  23.0  24.0   Platelets 150 - 400 K/uL 139         Latest Ref  Rng & Units 11/11/2022    4:16 AM 11/10/2022   10:39 PM 11/10/2022    2:04 PM  CMP  Glucose 70 - 99 mg/dL 155     BUN 8 - 23 mg/dL 70     Creatinine 0.61 - 1.24 mg/dL 0.73     Sodium 135 - 145 mmol/L 151  149    Potassium 3.5 - 5.1 mmol/L 4.0  3.6    Chloride 98 - 111 mmol/L 110     CO2 22 - 32 mmol/L 34     Calcium 8.9 - 10.3 mg/dL 10.4     Total Protein 6.5 - 8.1 g/dL   5.1     CXR: -  _______________________________________________________________  Assessment and Plan: S/P Esophagectomy, with anastomotic leak  Neuro: on precedex. CV: On amio Sinus tach.   Pulm: wean vent support as tolerated Renal:  creat down GI: NPO.  On trickle tube feeds Heme: stable ID: afebrile.  On Merrem and mica Endo: SSI Dispo: continue ICU care   Lajuana Matte 11/11/2022 8:13 AM

## 2022-11-11 NOTE — Progress Notes (Signed)
Vent changes made by CCM.

## 2022-11-11 NOTE — Progress Notes (Addendum)
NAME:  [name] [name], MRN:  [id number], DOB:  10-24-1960, LOS: 74 ADMISSION DATE:  09/18/2022, CONSULTATION DATE: 11/06/2022 REFERRING MD: Dr. 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  Overnight patient became short of breath, started spiking fever so he was transferred to ICU.  This morning patient was started spiking fever to 103, remained tachycardic and tachypneic, PCCM was called for help evaluation medical management  Pertinent  Medical History   Past Medical History:  Diagnosis Date   Cancer [facility])    Esophageal Cancer   History of radiation therapy    Esophagus- 05/12/22-06/22/22- Dr. Gery Pray   Port-A-Cath in place 05/11/2022   Significant Hospital Events: Including procedures, antibiotic start and stop dates in addition to other pertinent events   12/1 admitted for drainage from empyema 12/13 EGD with stent placement.  Interim History / Subjective:  On off pressors. Very sensitive to sedation. Drainage around PEJ tube. Appears to be in pain from abdomen.    Objective   Blood pressure 93/62, pulse (!) 113, temperature 97.7 F (36.5 C), resp. rate 11, height '5\' 10"'$  (1.778 m), weight 83.3 kg, SpO2 97 %.    Vent Mode: PRVC FiO2 (%):  [60 %-70 %] 60 % Set Rate:  [32 bmp] 32 bmp Vt Set:  [500 mL] 500 mL PEEP:  [10 cmH20] 10 cmH20 Plateau Pressure:  [26 cmH20-36 cmH20] 32 cmH20   Intake/Output Summary (Last 24 hours) at 11/11/2022 0951 Last data filed at 11/11/2022 0800 Gross per 24 hour  Intake 3972.36 ml  Output 3610 ml  Net 362.36 ml    Filed Weights   11/09/22 0430 11/10/22 0430 11/11/22 0500  Weight: 87.4 kg 90.3 kg 83.3 kg    Examination:  Physical exam: General: critically ill  appearing man lying in bed in NAD HEENT:  [place]/AT eyes anicteric, ETT Neuro: RASS -1, cough reflex intact, moves arms spontaneously.  Chest: 2 bulb drains on R draining small amount. Dullness at right base. Bronchial breath sounds left base. Heart: S1S2, tachycardic, reg rhythm Abdomen: soft, NT Skin: no rashes, warm & dry  Ancillary tests personally reviewed:  Cultures from 1/21 - E coli in sputum ABG - persistent compensated hypercarbia.  Hypernatremia 154 WBC 17.2 Assessment & Plan:  Acute hypoxic and hypercapnic respiratory failure due E coli Aspiration pneumonia-LUL, RLL with ARDS Septic shock due to bilateral multifocal pneumonia Possible leaking around PEJ Right-sided empyema s/p VATS, currently with chest tube placed Acute septic encephalopathy Moderate protein energy malnutrition Constipation vs ileus - questionable intestinal absorption.  Appears to be in pain.  Afib Hyponatremia> now hypernatremic Acute kidney injury likely due to septic ATN Anemia of chronic disease Adenocarcinoma esophagus s/p esophagectomy, adjuvant chemoradiation Anastomotic leak s/p esophageal stent Hyperglycemia due to acute illness, steroids. A1c is 4.3  - Increase sedation for comfort and ventilator synchrony. Have added IV versed infusion.  - Recheck ABG and adjust settings to gradually correct hypercarbia. Avoid auto-PEEP  - Increase Vt as current Vt at 500 is less than 8cc/kg - Wean PEEP/FiO2 for sat > 88% - Increase free water and continue current diuretics.  - Start TPN to ensure nutrition until ileus issues resolved.  - Titrate NE to keep MAP >65.  Stop stress steroids.   Best Practice (right click and "Reselect all  SmartList Selections" daily)   Diet/type: tubefeeds and NPO start TPN today.  DVT prophylaxis: prophylactic heparin  GI prophylaxis: PPI Lines: Central line and yes and it is still needed-picc Foley:  N/A Code Status:  DNR Last date of multidisciplinary goals of care  discussion [1/21: With the patient's wife, please see Ipal note]  CRITICAL CARE Performed by: Kipp Brood   Total critical care time: 50 minutes  Critical care time was exclusive of separately billable procedures and treating other patients.  Critical care was necessary to treat or prevent imminent or life-threatening deterioration.  Critical care was time spent personally by me on the following activities: development of treatment plan with patient and/or surrogate as well as nursing, discussions with consultants, evaluation of patient's response to treatment, examination of patient, obtaining history from patient or surrogate, ordering and performing treatments and interventions, ordering and review of laboratory studies, ordering and review of radiographic studies, pulse oximetry, re-evaluation of patient's condition and participation in multidisciplinary rounds.  Kipp Brood, MD [facility] ICU Physician [place]  Pager: [phone] Mobile: [phone] After hours: [phone].

## 2022-11-11 NOTE — Plan of Care (Signed)
  Problem: Elimination: Goal: Will not experience complications related to bowel motility Outcome: Not Progressing   Problem: Nutritional: Goal: Ability to achieve adequate nutritional intake will improve Outcome: Not Progressing   Still no substantial BM despite aggressive bowel regimen.  Relistor added today.  Tube feeding held for bowel rest, TPN to start this evening.

## 2022-11-11 NOTE — Progress Notes (Signed)
PHARMACY - TOTAL PARENTERAL NUTRITION CONSULT NOTE   Indication: Prolonged ileus  Patient Measurements: Height: '5\' 10"'$  (177.8 cm) Weight: 83.3 kg (183 lb 10.3 oz) IBW/kg (Calculated) : 73 TPN AdjBW (KG): 80 Body mass index is 26.35 kg/m. Usual Weight: 180-190 lbs before admission. Lowest at 170 lb this admit, now likely up due to volume   Assessment:  62 yo M with esophageal cancer with previous robotic assisted Ivor Lewis esophagectomy complicated by anastomotic leak  admitted 09/18/2022 from clinic with fevers and purulent drainage from chest tubes. Patient is s/p esophageal stent with persistent leak. Patient was on goal or near goal tube feeds 09/27/2022 - 11/08/22. On 1/24, TF rate was decreased to trickle due to increased pressor requirement. TF rate titrated up slowly and on 1/26, TF was increased to 50 ml/hr but started leaking out around patient's PEG tube and from JP drains. Last documented bowel movement was 1/16 (had liquid smear on 1/26), high concern for SBO vs ileus. Imaging is pending. Pharmacy consulted for TPN.   Glucose / Insulin: BG < 180 on hydrocortisone (now off), used 26 units insulin aspart/ 24hr, glargine 24 units  -1/25 Low BG x1 req D5 bolus Electrolytes: Na 154, K 3.8 (goal >/=4), iCa 1.6 Renal: Scr 0.7, BUN 70. 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 GI Surgeries / Procedures:  12/4  Esophagogastroscopy, Repositioned stent , R VATS 12/13 EGD with stent removal     Central access: 11/06/22 PICC TPN start date: 11/11/22   Nutritional Goals: Goal Concentrated TPN rate is 80 mL/hr (provides 138 g of protein and 2647 kcals per day)  RD Assessment: Estimated Needs Total Energy Estimated Needs: [id number] Total Protein Estimated Needs: 135-155 g Total Fluid Estimated Needs: >/= 2 L  Current Nutrition:  NPO  Plan:  Start TPN at 60 mL/hr at 1800, provides 104g protein, 288g dextrose, 1984 kcal, meeting 75% of estimated needs  Electrolytes in TPN: Na 0 mEq/L, K 30 mEq/L, Ca 0 mEq/L, Mg 5 mEq/L, and Phos 10 mmol/L. Cl:Ac Max Cl Add standard MVI and trace elements to TPN Continue Moderate q4h SSI and adjust as needed  Stop insulin aspart 5 units q4hr  Add insulin 50 units to TPN   Consider stopping free water when TPN starts (no sodium in TPN)  Monitor TPN labs daily until stable at goal then on Mon/Thurs   LydBenetta SparharmD, BCPS, BCC[place]untingburgarmacist  Please check AMION for all [facility] numbers After 10:00 PM, call [place]

## 2022-11-11 NOTE — Progress Notes (Signed)
Nutrition Follow-up  DOCUMENTATION CODES:   Severe malnutrition in context of chronic illness  INTERVENTION:   Discussed nutrition poc with Dr. Wallene Dales. Plan to hold TF today and initiate TPN  TPN order per Pharmacy: discussed with today's TPN Pharmacist  Recommend increasing free water provision. Discussed IV hydration vs free water per tube with Dr. Lynetta Mare. Plan to increase free water to 200 mL q 2 hours today. Discussed with TPN Pharmacist, plan for no sodium in TPN, increased free water  Free water 200 mL q 2 hours per J-tube provides 2400 mL. Estimated free water deficit around 4-5 L (based on current sodium 154, lowest wt this admission of 77 kg)  +constipation, possible ileus per MD. MD adjusting bowel regimen today  J-tube leakage continues. RD observed site today when gauze removed, some thick leakage (possibly TF mixed with GI contents) from around site. Noted external sutures are now cut, external bumper away from skin. Recommend checking Balloon Inflation (internal rentention bumper) by pulling back on the Balloon port; if no fluid returns, recommend instilling a few mL of fluid.   Discussed with RN and with Dr. Lynetta Mare.   NUTRITION DIAGNOSIS:   Severe Malnutrition related to chronic illness (esophageal cancer) as evidenced by severe fat depletion, severe muscle depletion, percent weight loss.  Being addressed via TPN  GOAL:   Patient will meet greater than or equal to 90% of their needs  Progressing  MONITOR:   Vent status, Labs, Weight trends, TF tolerance, Skin, I & O's  REASON FOR ASSESSMENT:   Consult Enteral/tube feeding initiation and management  ASSESSMENT:   62 y.o. male admits related to SOB and generalized weakness. PMH includes esophageal cancer. Pt is currently receiving medical management for fevers and failure to thrive.  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 Sedation increased today for comfort and vent synchrony  Vital 1.5 TF titration started yesterday, TF at 50 ml/hr this AM. Today's RN also cared for pt yesterday and indicates yesterday afternoon pt had significant drainage around J-tube site. Dr. Lynetta Mare present at bedside during discussion surrounding nutrition, J-tube leakage, lack of BM in >7 days. Dr. Lynetta Mare gave orders to stop TF. Plan to start TPN today  RD observed J-tube site, skin after removal of dressing by RN. Some thick leakage (looks like TF mixed with GI fluids), skin red. Noted 2 small areas with active bleeding. External sutures have been cut, external bumper not flush with skin  +small type 7 smear stool (mucous brown); otherwise no results post SMOG enema yesterday. MD escalating bowel regimen again today, abdominal xray also pending. Remains on reglan, senna, colace, miralax. Pt received sorbitol the last 2 days in addition to enema. Pt has been on reglan since 1/18. Dose of relistor ordered  today  Hypernatremia persists as does hypercalcemia and azotemia. Free water 200 mL q 4 hours currently. TF will no longer be providing additional free water as TF placed on hold.   Noted hypoglycemia over night, insulin regimen decreased by MD.  JP  drains to chest, RUQ with minimal output, dark in color  Non-pitting generalized edema. UOP almost 3 L in 24 hours, received lasix yesterday, ordered again for today  Current wt 83.3 kg; lowest weight this admission 77 kg. Unsure of actual dry weight  Labs: sodium 151 (H), ionized calcium 1.54 (H), calcium 10.4 (H-no albumin for correction, likely higher)  Meds: lasix 40 mg BID, colace, ss novolog, reglan, miralax, senna  Diet Order:   Diet Order             Diet NPO time specified  Diet effective now                   EDUCATION NEEDS:   No education needs have been identified at this time  Skin:  Skin Assessment: Skin Integrity Issues: Skin Integrity Issues:: Incisions Incisions: pain at J-tube site, WOC RN consulted; chest incisions  Last BM:  11/01/22  Height:   Ht Readings from Last 1 Encounters:  11/11/22 '5\' 10"'$  (1.778 m)    Weight:   Wt Readings from Last 1 Encounters:  11/11/22 83.3 kg     BMI:  Body mass index is 26.35 kg/m.  Estimated Nutritional Needs:   Kcal:  9166-0600 kcals  Protein:  135-155 g  Fluid:  >/= 2 L   [name] Passey MS, RDN, LDN, CNSC Registered Dietitian 3 Clinical Nutrition RD Pager and On-Call Pager Number Located in [place]

## 2022-11-12 ENCOUNTER — Inpatient Hospital Stay (HOSPITAL_COMMUNITY): Payer: No Typology Code available for payment source

## 2022-11-12 DIAGNOSIS — J9601 Acute respiratory failure with hypoxia: Secondary | ICD-10-CM | POA: Diagnosis not present

## 2022-11-12 LAB — COMPREHENSIVE METABOLIC PANEL
ALT: 187 U/L — ABNORMAL HIGH (ref 0–44)
AST: 123 U/L — ABNORMAL HIGH (ref 15–41)
Albumin: <1.5 g/dL — ABNORMAL LOW (ref 3.5–5.0)
Alkaline Phosphatase: 101 U/L (ref 38–126)
Anion gap: 8 (ref 5–15)
BUN: 68 mg/dL — ABNORMAL HIGH (ref 8–23)
CO2: 35 mmol/L — ABNORMAL HIGH (ref 22–32)
Calcium: 10.1 mg/dL (ref 8.9–10.3)
Chloride: 105 mmol/L (ref 98–111)
Creatinine, Ser: 0.67 mg/dL (ref 0.61–1.24)
GFR, Estimated: >60 mL/min (ref >60)
Glucose, Bld: 142 mg/dL — ABNORMAL HIGH (ref 70–99)
Potassium: 3.4 mmol/L — ABNORMAL LOW (ref 3.5–5.1)
Sodium: 148 mmol/L — ABNORMAL HIGH (ref 135–145)
Total Bilirubin: 0.6 mg/dL (ref 0.3–1.2)
Total Protein: 5 g/dL — ABNORMAL LOW (ref 6.5–8.1)

## 2022-11-12 LAB — POCT I-STAT 7, (LYTES, BLD GAS, ICA,H+H)
Acid-Base Excess: 11 mmol/L — ABNORMAL HIGH (ref 0.0–2.0)
Bicarbonate: 38.3 mmol/L — ABNORMAL HIGH (ref 20.0–28.0)
Calcium, Ion: 1.53 mmol/L (ref 1.15–1.40)
HCT: 27 % — ABNORMAL LOW (ref 39.0–52.0)
Hemoglobin: 9.2 g/dL — ABNORMAL LOW (ref 13.0–17.0)
O2 Saturation: 96 %
Patient temperature: 97.3
Potassium: 4.2 mmol/L (ref 3.5–5.1)
Sodium: 146 mmol/L — ABNORMAL HIGH (ref 135–145)
TCO2: 40 mmol/L — ABNORMAL HIGH (ref 22–32)
pCO2 arterial: 66.8 mmHg (ref 32–48)
pH, Arterial: 7.363 (ref 7.35–7.45)
pO2, Arterial: 82 mmHg — ABNORMAL LOW (ref 83–108)

## 2022-11-12 LAB — GLUCOSE, CAPILLARY
Glucose-Capillary: 119 mg/dL — ABNORMAL HIGH (ref 70–99)
Glucose-Capillary: 125 mg/dL — ABNORMAL HIGH (ref 70–99)
Glucose-Capillary: 127 mg/dL — ABNORMAL HIGH (ref 70–99)
Glucose-Capillary: 134 mg/dL — ABNORMAL HIGH (ref 70–99)
Glucose-Capillary: 144 mg/dL — ABNORMAL HIGH (ref 70–99)
Glucose-Capillary: 146 mg/dL — ABNORMAL HIGH (ref 70–99)
Glucose-Capillary: 153 mg/dL — ABNORMAL HIGH (ref 70–99)
Glucose-Capillary: 171 mg/dL — ABNORMAL HIGH (ref 70–99)
Glucose-Capillary: 71 mg/dL (ref 70–99)

## 2022-11-12 LAB — CBC
HCT: 27.2 % — ABNORMAL LOW (ref 39.0–52.0)
Hemoglobin: 8.2 g/dL — ABNORMAL LOW (ref 13.0–17.0)
MCH: 31.2 pg (ref 26.0–34.0)
MCHC: 30.1 g/dL (ref 30.0–36.0)
MCV: 103.4 fL — ABNORMAL HIGH (ref 80.0–100.0)
Platelets: 127 10*3/uL — ABNORMAL LOW (ref 150–400)
RBC: 2.63 MIL/uL — ABNORMAL LOW (ref 4.22–5.81)
RDW: 16.4 % — ABNORMAL HIGH (ref 11.5–15.5)
WBC: 15.4 10*3/uL — ABNORMAL HIGH (ref 4.0–10.5)
nRBC: 2.2 % — ABNORMAL HIGH (ref 0.0–0.2)

## 2022-11-12 LAB — TRIGLYCERIDES: Triglycerides: 214 mg/dL — ABNORMAL HIGH (ref <150)

## 2022-11-12 LAB — MAGNESIUM: Magnesium: 2.1 mg/dL (ref 1.7–2.4)

## 2022-11-12 LAB — POCT ACTIVATED CLOTTING TIME: Activated Clotting Time: 0 seconds

## 2022-11-12 LAB — PTH, INTACT AND CALCIUM
Calcium, Total (PTH): 10.8 mg/dL — ABNORMAL HIGH (ref 8.6–10.2)
PTH: 24 pg/mL (ref 15–65)

## 2022-11-12 LAB — SODIUM: Sodium: 147 mmol/L — ABNORMAL HIGH (ref 135–145)

## 2022-11-12 LAB — PHOSPHORUS: Phosphorus: 2.8 mg/dL (ref 2.5–4.6)

## 2022-11-12 MED ORDER — POTASSIUM CHLORIDE 20 MEQ PO PACK
40.0000 meq | PACK | Freq: Once | ORAL | Status: AC
  Administered 2022-11-12: 40 meq
  Filled 2022-11-12: qty 2

## 2022-11-12 MED ORDER — ALBUMIN HUMAN 25 % IV SOLN
50.0000 g | Freq: Once | INTRAVENOUS | Status: AC
  Administered 2022-11-12: 50 g via INTRAVENOUS
  Filled 2022-11-12: qty 200

## 2022-11-12 MED ORDER — TRACE MINERALS CU-MN-SE-ZN 300-55-60-3000 MCG/ML IV SOLN
INTRAVENOUS | Status: AC
  Filled 2022-11-12: qty 921.6

## 2022-11-12 MED ORDER — FREE WATER: 200.0000 mL | Freq: Four times a day (QID) | Status: DC

## 2022-11-12 MED ORDER — POTASSIUM CHLORIDE 10 MEQ/50ML IV SOLN
10.0000 meq | INTRAVENOUS | Status: AC
  Administered 2022-11-12 (×4): 10 meq via INTRAVENOUS
  Filled 2022-11-12 (×4): qty 50

## 2022-11-12 NOTE — Progress Notes (Signed)
[facility] Progress Note Patient Name: [name] [name] DOB: 29-Aug-1961 MRN: [id number]   Date of Service  11/12/2022  HPI/Events of Note  Radiologist called: Has large air in peritoneum and pneumomediastinum, new from previous scan.  Scan was ordered by Cardiothoracic surgeon- Dr Wilhemina Cash.  S/p esophagectomy with anastomotic leak per his note.  Hx of esophageal cancer, surgery, RT, chemo.  Camera: Discussed with RN.  VS stable. HR 82, MAP 71. In synchrony with vent, sedated. Sats 98% Fio2 at 80%. PIP < 25. On precedex, fenta drip Levo at 11 mcg/min. Amiodarone, TPN. On micafungine and meropenum.   eICU Interventions  Will notify Dr Kipp Brood for same and further advice. Notifed bed side RN and CVTS-Dr Lightfoot. Continue current care plan. No intervention needed. Has JP drains.      Intervention Category Intermediate Interventions: Other:;Communication with other healthcare providers and/or family  Elmer Sow 11/12/2022, 10:52 PM

## 2022-11-12 NOTE — Progress Notes (Signed)
[address]       [place] [address]             [phone]                 45 Days Post-Op Procedure(s) (LRB): ESOPHAGOGASTRODUODENOSCOPY (EGD) WITH PROPOFOL (N/A) ESOPHAGEAL STENT PLACEMENT (N/A) STENT REMOVAL   Events: Back on pressor this am Increase work of breathing _______________________________________________________________ Vitals: BP (!) 84/53   Pulse 70   Temp (!) 95 F (35 C) (Axillary)   Resp (!) 26   Ht '5\' 10"'$  (1.778 m)   Wt 84.3 kg   SpO2 95%   BMI 26.67 kg/m  Filed Weights   11/10/22 0430 11/11/22 0500 11/12/22 0500  Weight: 90.3 kg 83.3 kg 84.3 kg     - Neuro: sedated  - Cardiovascular: sinus tach   Drips: levo 13.  Vaso off.  epi off    - Pulm: minimal CT output Vent Mode: PRVC FiO2 (%):  [50 %-60 %] 50 % Set Rate:  [23 bmp-25 bmp] 25 bmp Vt Set:  [560 mL] 560 mL PEEP:  [10 cmH20] 10 cmH20 Plateau Pressure:  [28 cmH20-30 cmH20] 30 cmH20  ABG    Component Value Date/Time   PHART 7.349 (L) 11/11/2022 1403   PCO2ART 70.9 (HH) 11/11/2022 1403   PO2ART 64 (L) 11/11/2022 1403   HCO3 39.2 (H) 11/11/2022 1403   TCO2 41 (H) 11/11/2022 1403   ACIDBASEDEF 3.0 (H) 11/08/2022 0238   O2SAT 90 11/11/2022 1403    - Abd: ND - Extremity: warm  .Intake/Output      01/26 0701 01/27 0700 01/27 0701 01/28 0700   I.V. (mL/kg) 2156.5 (25.6)    Other 70    NG/GT 1950    IV Piggyback 450.9    Total Intake(mL/kg) 4627.4 (54.9)    Urine (mL/kg/hr) 1750 (0.9) 450 (5)   Drains 20 10   Stool 0    Chest Tube 30    Total Output 1800 460   Net +2827.4 -460        Stool Occurrence 1 x       _______________________________________________________________ Labs:    Latest Ref Rng & Units 11/12/2022    4:15 AM 11/11/2022    2:03 PM 11/11/2022   11:30 AM  CBC  WBC 4.0 - 10.5 K/uL 15.4     Hemoglobin 13.0 - 17.0 g/dL 8.2  8.5  8.2   Hematocrit 39.0 - 52.0 % 27.2  25.0  24.0   Platelets 150 - 400 K/uL 127         Latest  Ref Rng & Units 11/12/2022    4:15 AM 11/11/2022    2:03 PM 11/11/2022   11:30 AM  CMP  Glucose 70 - 99 mg/dL 142     BUN 8 - 23 mg/dL 68     Creatinine 0.61 - 1.24 mg/dL 0.67     Sodium 135 - 145 mmol/L 148  152  154   Potassium 3.5 - 5.1 mmol/L 3.4  4.0  3.5   Chloride 98 - 111 mmol/L 105     CO2 22 - 32 mmol/L 35     Calcium 8.9 - 10.3 mg/dL 10.1     Total Protein 6.5 - 8.1 g/dL 5.0     Total Bilirubin 0.3 - 1.2 mg/dL 0.6     Alkaline Phos 38 - 126 U/L 101     AST 15 - 41 U/L 123  ALT 0 - 44 U/L 187       CXR: -  _______________________________________________________________  Assessment and Plan: S/P Esophagectomy, with anastomotic leak  Neuro: on sedation CV: On amio Sinus tach.   Pulm: wean vent support as tolerated Renal: creat down GI: NPO.  On trickle tube feeds Heme: stable ID: afebrile.  On Merrem and mica.  Will get CT Chest/abd/pelvis Endo: SSI Dispo: continue ICU care   [place] 11/12/2022 8:05 AM

## 2022-11-12 NOTE — Progress Notes (Signed)
[facility] Progress Note Patient Name: [name] [name] DOB: 07/01/1961 MRN: [id number]   Date of Service  11/12/2022  HPI/Events of Note  K+ 3.4, Albumin < 1.5 gm / dl.  eICU Interventions  K+ replaced per E-Link electrolyte replacement protocol, Albumin 25 % 50 mg iv x 1 ordered        [name] [name] 11/12/2022, 5:57 AM

## 2022-11-12 NOTE — Progress Notes (Signed)
PHARMACY - TOTAL PARENTERAL NUTRITION CONSULT NOTE   Indication: Prolonged ileus  Patient Measurements: Height: '5\' 10"'$  (177.8 cm) Weight: 84.3 kg (185 lb 13.6 oz) IBW/kg (Calculated) : 73 TPN AdjBW (KG): 80 Body mass index is 26.67 kg/m. Usual Weight: 180-190 lbs before admission. Lowest at 170 lb this admit, now likely up due to volume   Assessment:  62 yo M with esophageal cancer and previous robotic assisted Ivor Lewis esophagectomy complicated by anastomotic leak  admitted 10/11/2022 from clinic with fevers and purulent drainage from chest tubes. Patient is now s/p esophageal stent with persistent leak. Patient was on goal or near goal tube feeds 10/03/2022 - 11/08/22. On 1/24, TF rate was decreased to trickle due to increased pressor requirement. TF rate titrated up slowly and on 1/26, TF was increased to 50 ml/hr but then started leaking out around patient's PEG tube and from JP drains. Last documented bowel movement was 1/16 (had liquid smear on 1/26), high concern for ileus. Pharmacy consulted for TPN.   Glucose / Insulin: BG < 180 off hydrocortisone, used 7 units insulin aspart since TPN started    Electrolytes: Na 148 down, K 3.4 (goal >/=4), CO2 35 (maxCl in TPN), iCa 1.6, others wnl  Renal: Scr 0.67, BUN 68. 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 1/26 KUB - Mild prominence of gas in the colon, most of the small bowel is gasless. GI Surgeries / Procedures:  12/4   Esophagogastroscopy, Repositioned stent , R VATS 12/13 EGD with stent removal    Central access: 11/06/22 PICC TPN start date: 11/11/22   Nutritional Goals: Goal Concentrated TPN rate is 80 mL/hr (provides 138 g of protein and 2647 kcals per day)  RD Assessment: Estimated Needs Total Energy Estimated Needs: 262023-3435cals Total Protein Estimated Needs: 135-155 g Total Fluid Estimated Needs: >/= 2 L  Current Nutrition:  NPO  Plan:  Advance concentrated TPN to goal 80 mL/hr at 1800  Electrolytes in TPN: Increase K 40 mEq/L; Continue Na 0 mEq/L, Ca 0 mEq/L, Mg 5 mEq/L, and Phos 10 mmol/L. Cl:Ac Max Cl Give IV Kcl 40 mEq Add standard MVI and trace elements to TPN Continue Moderate q4h SSI and adjust as needed   Increase insulin to 55 units in TPN   Consider stopping free water (no sodium in TPN)  Monitor TPN labs daily until stable at goal then on Mon/Thurs   LyBenetta SparPharmD, BCPS, B[place]fieldharmacist  Please check AMION for all [place] numbers After 10:00 PM, call [place]

## 2022-11-12 NOTE — Progress Notes (Signed)
NAME:  [name] [name], MRN:  [id number], DOB:  August 02, 1961, LOS: 52 ADMISSION DATE:  09/18/2022, CONSULTATION DATE: 11/06/2022 REFERRING MD: Dr. 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  Overnight patient became short of breath, started spiking fever so he was transferred to ICU.  This morning patient was started spiking fever to 103, remained tachycardic and tachypneic, PCCM was called for help evaluation medical management  Pertinent  Medical History   Past Medical History:  Diagnosis Date   [facility]    Esophageal Cancer   History of radiation therapy    Esophagus- 05/12/22-06/22/22- Dr. Gery Pray   Port-A-Cath in place 05/11/2022   Significant Hospital Events: Including procedures, antibiotic start and stop dates in addition to other pertinent events   12/1 admitted for drainage from empyema 12/13 EGD with stent placement. 1/26 started on TPN, still not stooling.   Interim History / Subjective:  Weaning vasopressors to off. Intermittent agitation.   Objective   Blood pressure 104/72, pulse (!) 112, temperature 99.5 F (37.5 C), resp. rate (!) 24, height '5\' 10"'$  (1.778 m), weight 84.3 kg, SpO2 93 %.    Vent Mode: PRVC FiO2 (%):  [50 %-60 %] 60 % Set Rate:  [25 bmp] 25 bmp Vt Set:  [560 mL] 560 mL PEEP:  [10 cmH20] 10 cmH20 Plateau Pressure:  [27 cmH20-32 cmH20] 32 cmH20   Intake/Output Summary (Last 24 hours) at 11/12/2022 1627 Last data filed at 11/12/2022 1500 Gross per 24 hour  Intake 3632.73 ml  Output 2510 ml  Net 1122.73 ml    Filed Weights   11/10/22 0430 11/11/22 0500 11/12/22 0500  Weight: 90.3 kg 83.3 kg 84.3 kg    Examination:  Physical exam: General: critically ill appearing  man lying in bed in NAD HEENT:  [place]/AT eyes anicteric, ETT. Small amount of subcutaneous emphysema left side.  Neuro: RASS -2,will move spontaneously but not to command.  Chest: 2 bulb drains on R draining small amount. Dullness at right base with decreased breath sounds. Bronchial breath sounds and crackles left base. Heart: S1S2, tachycardic, reg rhythm Abdomen: soft, NT Skin: no rashes, warm & dry  Ancillary tests personally reviewed:  Cultures from 1/21 - E coli in sputum ABG - persistent compensated hypercarbia. - improving pCO2 66 Hypernatremia 147  WBC 15.4 CXR shows new right pneumothorax Assessment & Plan:  Acute hypoxic and hypercapnic respiratory failure due E coli Aspiration pneumonia-LUL, RLL with ARDS Septic shock due to bilateral multifocal pneumonia Possible leaking around PEJ Right-sided empyema s/p VATS, currently with chest tube placed Acute septic encephalopathy Moderate protein energy malnutrition Constipation vs ileus - questionable intestinal absorption.  Appears to be in pain.  Afib Anemia of chronic disease Adenocarcinoma esophagus s/p esophagectomy, adjuvant chemoradiation Anastomotic leak s/p esophageal stent Hyperglycemia due to acute illness, steroids. A1c is 4.3  - Increase sedation for comfort and ventilator synchrony. Have added IV versed infusion.  - Recheck ABG and adjust settings to gradually correct hypercarbia. Avoid auto-PEEP  - Increased Vt as current Vt at 500 is less than 8cc/kg - Wean PEEP/FiO2 for sat > 88% - CT to evaluate cause of pneumothorax and rule out bowel obstruction.  - Start TPN to ensure nutrition until ileus issues resolved.  - Titrate NE to keep MAP >65.  Stop stress steroids.   Best Practice (right click and "Reselect all SmartList Selections" daily)   Diet/type: tubefeeds and NPO start TPN today.  DVT prophylaxis: prophylactic heparin  GI prophylaxis: PPI Lines: Central line and yes and it is still needed-picc Foley:   N/A Code Status:  DNR Last date of multidisciplinary goals of care discussion [1/21: With the patient's wife, please see Ipal note]  CRITICAL CARE Performed by: Kipp Brood   Total critical care time: 50 minutes  Critical care time was exclusive of separately billable procedures and treating other patients.  Critical care was necessary to treat or prevent imminent or life-threatening deterioration.  Critical care was time spent personally by me on the following activities: development of treatment plan with patient and/or surrogate as well as nursing, discussions with consultants, evaluation of patient's response to treatment, examination of patient, obtaining history from patient or surrogate, ordering and performing treatments and interventions, ordering and review of laboratory studies, ordering and review of radiographic studies, pulse oximetry, re-evaluation of patient's condition and participation in multidisciplinary rounds.  Kipp Brood, MD [facility] ICU Physician [place]  Pager: [phone] Mobile: [phone] After hours: [phone].

## 2022-11-12 NOTE — Progress Notes (Signed)
Pt transported to CT and back in vent. No issues.

## 2022-11-13 ENCOUNTER — Inpatient Hospital Stay (HOSPITAL_COMMUNITY): Payer: No Typology Code available for payment source

## 2022-11-13 DIAGNOSIS — J9601 Acute respiratory failure with hypoxia: Secondary | ICD-10-CM | POA: Diagnosis not present

## 2022-11-13 LAB — GLUCOSE, CAPILLARY
Glucose-Capillary: 179 mg/dL — ABNORMAL HIGH (ref 70–99)
Glucose-Capillary: 164 mg/dL — ABNORMAL HIGH (ref 70–99)
Glucose-Capillary: 168 mg/dL — ABNORMAL HIGH (ref 70–99)
Glucose-Capillary: 181 mg/dL — ABNORMAL HIGH (ref 70–99)
Glucose-Capillary: 193 mg/dL — ABNORMAL HIGH (ref 70–99)

## 2022-11-13 LAB — POCT I-STAT 7, (LYTES, BLD GAS, ICA,H+H)
Acid-Base Excess: 12 mmol/L — ABNORMAL HIGH (ref 0.0–2.0)
Bicarbonate: 40.6 mmol/L — ABNORMAL HIGH (ref 20.0–28.0)
Calcium, Ion: 1.5 mmol/L — ABNORMAL HIGH (ref 1.15–1.40)
HCT: 27 % — ABNORMAL LOW (ref 39.0–52.0)
Hemoglobin: 9.2 g/dL — ABNORMAL LOW (ref 13.0–17.0)
O2 Saturation: 84 %
Patient temperature: 98.6
Potassium: 4.3 mmol/L (ref 3.5–5.1)
Sodium: 147 mmol/L — ABNORMAL HIGH (ref 135–145)
TCO2: 43 mmol/L — ABNORMAL HIGH (ref 22–32)
pCO2 arterial: 78.6 mmHg (ref 32–48)
pH, Arterial: 7.321 — ABNORMAL LOW (ref 7.35–7.45)
pO2, Arterial: 56 mmHg — ABNORMAL LOW (ref 83–108)

## 2022-11-13 LAB — CBC
HCT: 24.7 % — ABNORMAL LOW (ref 39.0–52.0)
Hemoglobin: 7.4 g/dL — ABNORMAL LOW (ref 13.0–17.0)
MCH: 31.2 pg (ref 26.0–34.0)
MCHC: 30 g/dL (ref 30.0–36.0)
MCV: 104.2 fL — ABNORMAL HIGH (ref 80.0–100.0)
Platelets: 117 10*3/uL — ABNORMAL LOW (ref 150–400)
RBC: 2.37 MIL/uL — ABNORMAL LOW (ref 4.22–5.81)
RDW: 18 % — ABNORMAL HIGH (ref 11.5–15.5)
WBC: 16.5 10*3/uL — ABNORMAL HIGH (ref 4.0–10.5)
nRBC: 2.8 % — ABNORMAL HIGH (ref 0.0–0.2)

## 2022-11-13 LAB — BASIC METABOLIC PANEL
Anion gap: 6 (ref 5–15)
BUN: 54 mg/dL — ABNORMAL HIGH (ref 8–23)
CO2: 36 mmol/L — ABNORMAL HIGH (ref 22–32)
Calcium: 9.7 mg/dL (ref 8.9–10.3)
Chloride: 103 mmol/L (ref 98–111)
Creatinine, Ser: 0.68 mg/dL (ref 0.61–1.24)
GFR, Estimated: >60 mL/min (ref >60)
Glucose, Bld: 169 mg/dL — ABNORMAL HIGH (ref 70–99)
Potassium: 4.1 mmol/L (ref 3.5–5.1)
Sodium: 145 mmol/L (ref 135–145)

## 2022-11-13 LAB — MAGNESIUM: Magnesium: 2.1 mg/dL (ref 1.7–2.4)

## 2022-11-13 LAB — PHOSPHORUS: Phosphorus: 2.6 mg/dL (ref 2.5–4.6)

## 2022-11-13 MED ORDER — NEOSTIGMINE METHYLSULFATE 10 MG/10ML IV SOLN
3.0000 mg | Freq: Four times a day (QID) | INTRAVENOUS | Status: AC
  Administered 2022-11-13 – 2022-11-14 (×3): 3 mg via INTRAVENOUS
  Filled 2022-11-13 (×5): qty 3

## 2022-11-13 MED ORDER — TRACE MINERALS CU-MN-SE-ZN 300-55-60-3000 MCG/ML IV SOLN
INTRAVENOUS | Status: AC
  Filled 2022-11-13: qty 921.6

## 2022-11-13 MED ORDER — FUROSEMIDE 10 MG/ML IJ SOLN
60.0000 mg | Freq: Two times a day (BID) | INTRAMUSCULAR | Status: DC
  Administered 2022-11-13 – 2022-11-19 (×11): 60 mg via INTRAVENOUS
  Filled 2022-11-13 (×12): qty 6

## 2022-11-13 MED ORDER — INSULIN ASPART 100 UNIT/ML IJ SOLN
0.0000 [IU] | Freq: Three times a day (TID) | INTRAMUSCULAR | Status: DC
  Administered 2022-11-13 (×2): 3 [IU] via SUBCUTANEOUS
  Administered 2022-11-14 (×2): 2 [IU] via SUBCUTANEOUS
  Administered 2022-11-14: 3 [IU] via SUBCUTANEOUS

## 2022-11-13 MED ORDER — ATROPINE SULFATE 1 MG/10ML IJ SOSY
PREFILLED_SYRINGE | INTRAMUSCULAR | Status: AC
  Filled 2022-11-13: qty 10

## 2022-11-13 MED ORDER — NEOSTIGMINE METHYLSULFATE 10 MG/10ML IV SOLN
3.0000 mg | Freq: Four times a day (QID) | INTRAVENOUS | Status: DC
  Filled 2022-11-13 (×4): qty 3

## 2022-11-13 MED ORDER — PEG 3350-KCL-NA BICARB-NACL 420 G PO SOLR
2000.0000 mL | Freq: Once | ORAL | Status: AC
  Administered 2022-11-13: 2000 mL
  Filled 2022-11-13: qty 4000

## 2022-11-13 MED ORDER — PEG 3350-KCL-NA BICARB-NACL 420 G PO SOLR
4000.0000 mL | Freq: Once | ORAL | Status: DC
  Filled 2022-11-13: qty 4000

## 2022-11-13 NOTE — Progress Notes (Signed)
[address]       [place] [address]             [phone]                 46 Days Post-Op Procedure(s) (LRB): ESOPHAGOGASTRODUODENOSCOPY (EGD) WITH PROPOFOL (N/A) ESOPHAGEAL STENT PLACEMENT (N/A) STENT REMOVAL   Events: Back on pressor this am CT stable.  Small amount of air, expected _______________________________________________________________ Vitals: BP 130/61   Pulse 95   Temp 98.6 F (37 C)   Resp (!) 23   Ht '5\' 10"'$  (1.778 m)   Wt 83.7 kg   SpO2 97%   BMI 26.48 kg/m  Filed Weights   11/11/22 0500 11/12/22 0500 11/13/22 0500  Weight: 83.3 kg 84.3 kg 83.7 kg     - Neuro: sedated  - Cardiovascular: sinus tach   Drips: levo 20.  Vaso off.  epi off    - Pulm: minimal CT output Vent Mode: PRVC FiO2 (%):  [50 %-80 %] 60 % Set Rate:  [25 bmp] 25 bmp Vt Set:  [560 mL] 560 mL PEEP:  [10 cmH20] 10 cmH20 Plateau Pressure:  [25 cmH20-32 cmH20] 25 cmH20  ABG    Component Value Date/Time   PHART 7.363 11/12/2022 0805   PCO2ART 66.8 (HH) 11/12/2022 0805   PO2ART 82 (L) 11/12/2022 0805   HCO3 38.3 (H) 11/12/2022 0805   TCO2 40 (H) 11/12/2022 0805   ACIDBASEDEF 3.0 (H) 11/08/2022 0238   O2SAT 96 11/12/2022 0805    - Abd: ND - Extremity: warm  .Intake/Output      01/27 0701 01/28 0700 01/28 0701 01/29 0700   I.V. (mL/kg) 2979.4 (35.6) 259.1 (3.1)   Other 1090    NG/GT     IV Piggyback 632.6 54.4   Total Intake(mL/kg) 4702 (56.2) 313.5 (3.7)   Urine (mL/kg/hr) 3600 (1.8)    Drains 85    Stool     Chest Tube     Total Output 3685    Net +1017 +313.5        Urine Occurrence 1 x       _______________________________________________________________ Labs:    Latest Ref Rng & Units 11/13/2022    3:46 AM 11/12/2022    8:05 AM 11/12/2022    4:15 AM  CBC  WBC 4.0 - 10.5 K/uL 16.5   15.4   Hemoglobin 13.0 - 17.0 g/dL 7.4  9.2  8.2   Hematocrit 39.0 - 52.0 % 24.7  27.0  27.2   Platelets 150 - 400 K/uL 117   127        Latest Ref Rng & Units 11/13/2022    3:46 AM 11/12/2022    3:31 PM 11/12/2022    8:05 AM  CMP  Glucose 70 - 99 mg/dL 169     BUN 8 - 23 mg/dL 54     Creatinine 0.61 - 1.24 mg/dL 0.68     Sodium 135 - 145 mmol/L 145  147  146   Potassium 3.5 - 5.1 mmol/L 4.1   4.2   Chloride 98 - 111 mmol/L 103     CO2 22 - 32 mmol/L 36     Calcium 8.9 - 10.3 mg/dL 9.7       CXR: -  _______________________________________________________________  Assessment and Plan: S/P Esophagectomy, with anastomotic leak  Neuro: on sedation CV: On amio Sinus tach.   Pulm: wean vent support as tolerated.  Will place CT to pleurovac Renal:  creat down GI: NPO.  On trickle tube feeds Heme: stable ID: afebrile.  On Merrem and mica.   Endo: SSI Dispo: continue ICU care   [name] [name] 11/13/2022 10:15 AM

## 2022-11-13 NOTE — Progress Notes (Addendum)
PHARMACY - TOTAL PARENTERAL NUTRITION CONSULT NOTE   Indication: Prolonged ileus  Patient Measurements: Height: '5\' 10"'$  (177.8 cm) Weight: 83.7 kg (184 lb 8.4 oz) IBW/kg (Calculated) : 73 TPN AdjBW (KG): 80 Body mass index is 26.48 kg/m. Usual Weight: 180-190 lbs before admission. Lowest at 170 lb this admit, now likely up due to volume   Assessment:  62 yo M with esophageal cancer and previous robotic assisted Ivor Lewis esophagectomy complicated by anastomotic leak  admitted 10/15/2022 from clinic with fevers and purulent drainage from chest tubes. Patient is now s/p esophageal stent with persistent leak. Patient was on goal or near goal tube feeds 09/30/2022 - 11/08/22. On 1/24, TF rate was decreased to trickle due to increased pressor requirement. TF rate titrated up slowly and on 1/26, TF was increased to 50 ml/hr but then started leaking out around patient's PEG tube and from JP drains. Last documented bowel movement was 1/16 (had liquid smear on 1/26), high concern for ileus confirmed on imaging. Pharmacy consulted for TPN.   Glucose / Insulin: BG < 180 off hydrocortisone, used 10 units insulin aspart /24hr Electrolytes: Na 145 down, K 4.1 (received 40 mEq IV, goal >/=4), CO2 36 (maxCl in TPN), iCa 1.53, others wnl  Renal: Scr 0.67, BUN 68. 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 1/26 KUB - Mild prominence of gas in the colon, most of the small bowel is gasless.  1/27 CT-   Large-volume pneumoperitoneum in the upper abdomen, Increased body wall edema and increased mesenteric edema, possible ileus in colon, moderate retained stool.  GI Surgeries / Procedures:  12/4  Esophagogastroscopy, Repositioned stent , R VATS 12/13 EGD with stent removal    Central access: 11/06/22 PICC TPN start date: 11/11/22   Nutritional Goals: Goal Concentrated TPN rate is 80 mL/hr (provides 138 g of protein and 2614 kcals per day)  RD Assessment: Estimated Needs Total Energy Estimated Needs: 9528-4132 kcals Total Protein Estimated Needs: 135-155 g Total Fluid Estimated Needs: >/= 2 L  Current Nutrition:  NPO + TPN   Plan:  Continue concentrated TPN at goal 80 mL/hr at 1800  Electrolytes in TPN: Increase K 45 mEq/L, and Phos 15 mmol/L; Continue Na 0 mEq/L, Ca 0 mEq/L, Mg 5 mEq/L. Cl:Ac Max Cl  Add standard MVI and trace elements to TPN Decrease to Moderate Q8hr SSI and adjust as needed   Increase insulin to 60 units in TPN    F/u Na, will likely need to add back to TPN 1/29  Monitor TPN labs daily until stable at goal then on Mon/Thurs   Benetta Spar, PharmD, BCPS, [place] Pharmacist  Please check AMION for all [place] phone numbers After 10:00 PM, call [place]

## 2022-11-13 NOTE — Progress Notes (Signed)
RT assisted MD with ETT exchange. Patient hyper-oxygenated before. Patient tolerated well and no complications/VS stable. Positive color change noted, chest rise and fall as well as bilateral breath sounds. Patient immediately placed back on vent after. RT will continue to monitor.

## 2022-11-13 NOTE — Progress Notes (Signed)
RT obtained ABG and reported results to MD. ABG as follows: 7.32/78.6/56/40.6 w/sat 84%. MD aware and no new orders for RT at this time.

## 2022-11-13 NOTE — Progress Notes (Addendum)
NAME:  [name] [name], MRN:  [id number], DOB:  May 28, 1961, LOS: 44 ADMISSION DATE:  09/18/2022, CONSULTATION DATE: 11/06/2022 REFERRING MD: Dr. 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  Overnight patient became short of breath, started spiking fever so he was transferred to ICU.  This morning patient was started spiking fever to 103, remained tachycardic and tachypneic, PCCM was called for help evaluation medical management  Pertinent  Medical History   Past Medical History:  Diagnosis Date   Cancer [facility])    Esophageal Cancer   History of radiation therapy    Esophagus- 05/12/22-06/22/22- Dr. Gery Pray   Port-A-Cath in place 05/11/2022   Significant Hospital Events: Including procedures, antibiotic start and stop dates in addition to other pertinent events   12/1 admitted for drainage from empyema 12/13 EGD with stent placement. 1/26 started on TPN, still not stooling.   Interim History / Subjective:  On/off vasopressors. Endotracheal cuff leak. Appears uncomfortable on sedation weaning.   Objective   Blood pressure 130/61, pulse 95, temperature 98.6 F (37 C), resp. rate (!) 23, height '5\' 10"'$  (1.778 m), weight 83.7 kg, SpO2 97 %.    Vent Mode: PRVC FiO2 (%):  [50 %-80 %] 60 % Set Rate:  [25 bmp] 25 bmp Vt Set:  [560 mL] 560 mL PEEP:  [10 cmH20] 10 cmH20 Plateau Pressure:  [25 cmH20-32 cmH20] 25 cmH20   Intake/Output Summary (Last 24 hours) at 11/13/2022 1015 Last data filed at 11/13/2022 0900 Gross per 24 hour  Intake 4644.1 ml  Output 3225 ml  Net 1419.1 ml    Filed Weights   11/11/22 0500 11/12/22 0500 11/13/22 0500  Weight: 83.3 kg 84.3 kg 83.7 kg    Examination:  Physical exam: General:  critically ill appearing man lying in bed in NAD HEENT:  [place]/AT eyes anicteric, ETT. Small amount of subcutaneous emphysema left side - stable.   Neuro: RASS -4,will move spontaneously but not to command.  Chest: 2 bulb drains on R draining small amount. Dullness at right base with decreased breath sounds. Bronchial breath sounds and crackles left base. Heart: S1S2, tachycardic, reg rhythm Abdomen: soft, NT Skin: no rashes, warm & dry  Ancillary tests personally reviewed:  Na now 145 CO2 increasing slowly to 36 WBC 16.5 - stable Thrombocytopenia 117  CT chest shows resolved pneumothorax (bulbs are now charged), there is extensive mediastinal and subcutaneous air throughout.  Dense consolidation of the left base persists. No evidence of secretion impaction.  Colonic ileus  Assessment & Plan:  Acute hypoxic and hypercapnic respiratory failure due E coli Aspiration pneumonia-LUL, RLL with ARDS Septic shock due to bilateral multifocal pneumonia Possible leaking around PEJ Right-sided empyema s/p VATS, currently with chest tube placed Acute septic encephalopathy Moderate protein energy malnutrition Constipation vs ileus - questionable intestinal absorption.  Appears to be in pain.  Afib Anemia of chronic disease Adenocarcinoma esophagus s/p esophagectomy, adjuvant chemoradiation Anastomotic leak s/p esophageal stent Hyperglycemia due to acute illness, steroids. A1c is 4.3  - Continue sedation for comfort and ventilator synchrony - target RASS -2 but avoid any abrupt decreases in sedation as patient becomes uncomfortable and might be experiencing withdrawal. Don't decrease by more than 25% per day.  - Recheck ABG and adjust settings to gradually correct hypercarbia. Avoid auto-PEEP. Vt  572m = 8cc/kg - Wean PEEP/FiO2 for sat > 88%. Relatively focal basilar consolidation, may not be particularly PEEP responsive.  - CTP to assist with secretion clearance.  - Continue diuresis to keep in  negative fluid balance.  - Continue TPN to ensure nutrition until ileus issues resolved.  - Will try neostigmine again and follow with PEG if successful. Consider trickle feeds then.  - Titrate NE to keep MAP >65.  Stop stress steroids.   Best Practice (right click and "Reselect all SmartList Selections" daily)   Diet/type: NPO and TPN  DVT prophylaxis: prophylactic heparin  GI prophylaxis: PPI Lines: Central line and yes and it is still needed-picc Foley:  N/A Code Status:  DNR Last date of multidisciplinary goals of care discussion [1/21: With the patient's wife, please see Ipal note]  CRITICAL CARE Performed by: RKipp Brood Total critical care time: 50 minutes  Critical care time was exclusive of separately billable procedures and treating other patients.  Critical care was necessary to treat or prevent imminent or life-threatening deterioration.  Critical care was time spent personally by me on the following activities: development of treatment plan with patient and/or surrogate as well as nursing, discussions with consultants, evaluation of patient's response to treatment, examination of patient, obtaining history from patient or surrogate, ordering and performing treatments and interventions, ordering and review of laboratory studies, ordering and review of radiographic studies, pulse oximetry, re-evaluation of patient's condition and participation in multidisciplinary rounds.  RKipp Brood MD [facility] Phys[facility] [place] Pager: [phone]: [phone] hours: [phone].

## 2022-11-14 DIAGNOSIS — J869 Pyothorax without fistula: Secondary | ICD-10-CM | POA: Diagnosis not present

## 2022-11-14 LAB — COMPREHENSIVE METABOLIC PANEL
ALT: 70 U/L — ABNORMAL HIGH (ref 0–44)
AST: 49 U/L — ABNORMAL HIGH (ref 15–41)
Albumin: 1.6 g/dL — ABNORMAL LOW (ref 3.5–5.0)
Alkaline Phosphatase: 93 U/L (ref 38–126)
Anion gap: 12 (ref 5–15)
BUN: 46 mg/dL — ABNORMAL HIGH (ref 8–23)
CO2: 33 mmol/L — ABNORMAL HIGH (ref 22–32)
Calcium: 9.5 mg/dL (ref 8.9–10.3)
Chloride: 102 mmol/L (ref 98–111)
Creatinine, Ser: 0.64 mg/dL (ref 0.61–1.24)
GFR, Estimated: >60 mL/min (ref >60)
Glucose, Bld: 131 mg/dL — ABNORMAL HIGH (ref 70–99)
Potassium: 4.8 mmol/L (ref 3.5–5.1)
Sodium: 147 mmol/L — ABNORMAL HIGH (ref 135–145)
Total Bilirubin: 0.6 mg/dL (ref 0.3–1.2)
Total Protein: 5 g/dL — ABNORMAL LOW (ref 6.5–8.1)

## 2022-11-14 LAB — CYTOLOGY - NON PAP

## 2022-11-14 LAB — GLUCOSE, CAPILLARY
Glucose-Capillary: 176 mg/dL — ABNORMAL HIGH (ref 70–99)
Glucose-Capillary: 137 mg/dL — ABNORMAL HIGH (ref 70–99)
Glucose-Capillary: 139 mg/dL — ABNORMAL HIGH (ref 70–99)
Glucose-Capillary: 122 mg/dL — ABNORMAL HIGH (ref 70–99)
Glucose-Capillary: 126 mg/dL — ABNORMAL HIGH (ref 70–99)

## 2022-11-14 LAB — MAGNESIUM: Magnesium: 2.1 mg/dL (ref 1.7–2.4)

## 2022-11-14 LAB — TRIGLYCERIDES: Triglycerides: 129 mg/dL (ref <150)

## 2022-11-14 LAB — PHOSPHORUS: Phosphorus: 3.4 mg/dL (ref 2.5–4.6)

## 2022-11-14 MED ORDER — AMIODARONE HCL 200 MG PO TABS
200.0000 mg | ORAL_TABLET | Freq: Every day | ORAL | Status: DC
  Administered 2022-11-14 – 2022-11-19 (×6): 200 mg
  Filled 2022-11-14 (×6): qty 1

## 2022-11-14 MED ORDER — ALBUMIN HUMAN 25 % IV SOLN
25.0000 g | Freq: Four times a day (QID) | INTRAVENOUS | Status: AC
  Administered 2022-11-14 – 2022-11-15 (×4): 25 g via INTRAVENOUS
  Filled 2022-11-14 (×4): qty 100

## 2022-11-14 MED ORDER — METOLAZONE 5 MG PO TABS
5.0000 mg | ORAL_TABLET | Freq: Once | ORAL | Status: AC
  Administered 2022-11-14: 5 mg
  Filled 2022-11-14: qty 1

## 2022-11-14 MED ORDER — TRACE MINERALS CU-MN-SE-ZN 300-55-60-3000 MCG/ML IV SOLN
INTRAVENOUS | Status: AC
  Filled 2022-11-14: qty 921.6

## 2022-11-14 MED ORDER — FREE WATER
200.0000 mL | Freq: Three times a day (TID) | Status: DC
  Administered 2022-11-14 – 2022-11-17 (×9): 200 mL

## 2022-11-14 MED ORDER — MIDODRINE HCL 5 MG PO TABS
5.0000 mg | ORAL_TABLET | Freq: Three times a day (TID) | ORAL | Status: DC
  Administered 2022-11-14 – 2022-11-19 (×15): 5 mg
  Filled 2022-11-14 (×15): qty 1

## 2022-11-14 MED ORDER — SODIUM CHLORIDE 0.9 % IV SOLN: INTRAVENOUS | Status: DC | PRN

## 2022-11-14 MED ORDER — SENNA 8.6 MG PO TABS: 1.0000 | ORAL_TABLET | Freq: Every day | ORAL | Status: DC | PRN

## 2022-11-14 MED ORDER — FREE WATER: 200.0000 mL | Status: DC

## 2022-11-14 NOTE — Procedures (Signed)
Arterial Catheter Insertion Procedure Note  [name]  [id number]  December 19, 1960  Date:11/14/22  Time:8:50 AM    Provider Performing: Judith Part    Procedure: Insertion of Arterial Line [phone]) without US guidance  Indication(s) Blood pressure monitoring and/or need for frequent ABGs  Consent Risks of the procedure as well as the alternatives and risks of each were explained to the patient and/or caregiver.  Consent for the procedure was obtained and is signed in the bedside chart  Anesthesia None   Time Out Verified patient identification, verified procedure, site/side was marked, verified correct patient position, special equipment/implants available, medications/allergies/relevant history reviewed, required imaging and test results available.   Sterile Technique Maximal sterile technique including full sterile barrier drape, hand hygiene, sterile gown, sterile gloves, mask, hair covering, sterile ultrasound probe cover (if used).   Procedure Description Area of catheter insertion was cleaned with chlorhexidine and draped in sterile fashion. Without real-time ultrasound guidance an arterial catheter was placed into the right radial artery.  Appropriate arterial tracings confirmed on monitor.     Complications/Tolerance None; patient tolerated the procedure well.   EBL Minimal   Specimen(s) None

## 2022-11-14 NOTE — Progress Notes (Signed)
[address]       [place] [address]             [phone]       EVENING ROUNDS  Has come down on versed drip Off amio Awaiting vent wean Cont levo wean

## 2022-11-14 NOTE — Progress Notes (Addendum)
NAME:  [name] [name], MRN:  [id number], DOB:  09/20/1961, LOS: 77 ADMISSION DATE:  10/08/2022, CONSULTATION DATE: 11/06/2022 REFERRING MD: Dr. 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  Overnight patient became short of breath, started spiking fever so he was transferred to ICU.  This morning patient was started spiking fever to 103, remained tachycardic and tachypneic, PCCM was called for help evaluation medical management  Pertinent  Medical History   Past Medical History:  Diagnosis Date   Cancer [facility])    Esophageal Cancer   History of radiation therapy    Esophagus- 05/12/22-06/22/22- Dr. Gery Pray   Port-A-Cath in place 05/11/2022   Significant Hospital Events: Including procedures, antibiotic start and stop dates in addition to other pertinent events   12/1 admitted for drainage from empyema 12/13 EGD with stent placement. 1/26 started on TPN, still not stooling.   Interim History / Subjective:  Heavily sedated on vent. Driving pressure 23 this am.  Objective   Blood pressure 108/63, pulse 76, temperature 98.4 F (36.9 C), temperature source Axillary, resp. rate (!) 25, height '5\' 10"'$  (1.778 m), weight 83.7 kg, SpO2 96 %.    Vent Mode: PRVC FiO2 (%):  [60 %] 60 % Set Rate:  [25 bmp] 25 bmp Vt Set:  [560 mL] 560 mL PEEP:  [10 cmH20] 10 cmH20 Plateau Pressure:  [25 cmH20-31 cmH20] 25 cmH20   Intake/Output Summary (Last 24 hours) at 11/14/2022 0843 Last data filed at 11/14/2022 0600 Gross per 24 hour  Intake 3325.33 ml  Output 5485 ml  Net -2159.67 ml    Filed Weights   11/11/22 0500 11/12/22 0500 11/13/22 0500  Weight: 83.3 kg 84.3 kg 83.7 kg    Examination:  Sedated on vent Multiple  drains over R chest Harsh rhonci bilaterally Ext warm +anasarca Moved everything with sedation lightening +crepitus L chest  Patient Lines/Drains/Airways Status     Active Line/Drains/Airways     Name Placement date Placement time Site Days   Implanted Port Right Chest --  --  Chest  --   PICC Triple Lumen 11/06/22 Left Brachial 45 cm 1 cm 11/06/22  1643  -- 8   Closed System Drain 1 Right Chest Bulb (JP) 19 Fr. 08/29/22  1530  Chest  77   Closed System Drain 2 Lateral;Right RUQ Bulb (JP) 10/21/22  1200  RUQ  24   Gastrostomy/Enterostomy Jejunostomy 16 Fr. LUQ 08/18/22  1147  LUQ  88   External Urinary Catheter 11/08/22  2215  --  6   GI Stent 09/27/2022  1530  --  47   Fecal Management System 35 mL 11/13/22  1445  -- 1   Airway 8 mm 11/13/22  1010  -- 1   Incision (Closed) 08/29/22 Other (Comment) Right 08/29/22  1523  -- 77   Incision (Closed) 09/22/2022 Chest Right 09/17/2022  1106  -- 56   Pressure Injury 11/08/22 Sacrum Posterior;Medial;Lower Deep Tissue Pressure Injury - Purple or maroon localized area of discolored intact skin or blood-filled blister due to damage of underlying soft tissue from pressure and/or shear. 2x1 purple area not 11/08/22  0904  -- 6   Wound / Incision (Open or Dehisced) 11/09/22 Other (Comment) Sternum Lower;Right Small pink abrasion noted at edge of tape dressing. 11/09/22  2100  Sternum  5            CT personally reviewed  Assessment & Plan:  Acute hypoxic and hypercapnic respiratory failure due E coli Aspiration pneumonia-LUL, RLL with ARDS Septic shock due to bilateral multifocal pneumonia Right-sided empyema s/p VATS, currently with chest tube placed Acute septic encephalopathy Moderate protein energy malnutrition Constipation- improved Afib new onset- resolved, hold on [facility] for now Anemia of chronic disease Adenocarcinoma esophagus s/p esophagectomy, adjuvant chemoradiation Anastomotic leak s/p esophageal stent: ongoing issue; eventual  potential revision at [facility] if can get recovered Hyperglycemia due to acute illness, steroids. A1c is 4.3  - Versed/fent for now, see if we can get to a comfortable spot between over-sedation and vent synchrony - Lung protective tidal volumes limiting driving pressures to < 15cm H2O as able - Sedation titrated to vent compliance and patient comfort using PAD orderset - VAP prevention bundle - Daily SAT/SBT when meets institutional criteria: O2/PEEP needs still too high - Meropenem and micafungin until Feb 9 per ID - Push diuresis, add albumin/metolazone - Wean levophed for MAP 65 - Start midodrine - Check AM Pct, cortisol - cannot eval echo due to esophageal stent and subQ   Best Practice (right click and "Reselect all SmartList Selections" daily)   Diet/type: NPO and TPN  DVT prophylaxis: prophylactic heparin  GI prophylaxis: PPI Lines: Central line and yes and it is still needed-picc Foley:  N/A Code Status:  DNR Last date of multidisciplinary goals of care discussion [1/21: With the patient's wife, please see Ipal note]  34 min cc time Erskine Emery MD PCCM

## 2022-11-14 NOTE — Progress Notes (Signed)
[address]       [place] [address]             [phone]                 47 Days Post-Op Procedure(s) (LRB): ESOPHAGOGASTRODUODENOSCOPY (EGD) WITH PROPOFOL (N/A) ESOPHAGEAL STENT PLACEMENT (N/A) STENT REMOVAL   Events: No events Passed some stool _______________________________________________________________ Vitals: BP (!) 98/47   Pulse 89   Temp 98.4 F (36.9 C) (Axillary)   Resp (!) 25   Ht '5\' 10"'$  (1.778 m)   Wt 83.7 kg   SpO2 94%   BMI 26.48 kg/m  Filed Weights   11/11/22 0500 11/12/22 0500 11/13/22 0500  Weight: 83.3 kg 84.3 kg 83.7 kg     - Neuro: sedated  - Cardiovascular: sinus ta  Drips: levo 14    - Pulm: minimal CT output Vent Mode: PRVC FiO2 (%):  [60 %-70 %] 60 % Set Rate:  [25 bmp] 25 bmp Vt Set:  [560 mL] 560 mL PEEP:  [10 cmH20] 10 cmH20 Plateau Pressure:  [25 cmH20-31 cmH20] 25 cmH20  ABG    Component Value Date/Time   PHART 7.321 (L) 11/13/2022 1113   PCO2ART 78.6 (HH) 11/13/2022 1113   PO2ART 56 (L) 11/13/2022 1113   HCO3 40.6 (H) 11/13/2022 1113   TCO2 43 (H) 11/13/2022 1113   ACIDBASEDEF 3.0 (H) 11/08/2022 0238   O2SAT 84 11/13/2022 1113    - Abd: ND - Extremity: warm  .Intake/Output      01/28 0701 01/29 0700 01/29 0701 01/30 0700   I.V. (mL/kg) 2990.1 (35.7)    Other 30    IV Piggyback 433.6    Total Intake(mL/kg) 3453.7 (41.3)    Urine (mL/kg/hr) 5450 (2.7)    Drains     Stool 35    Total Output 5485    Net -2031.4         Stool Occurrence 1 x       _______________________________________________________________ Labs:    Latest Ref Rng & Units 11/13/2022   11:13 AM 11/13/2022    3:46 AM 11/12/2022    8:05 AM  CBC  WBC 4.0 - 10.5 K/uL  16.5    Hemoglobin 13.0 - 17.0 g/dL 9.2  7.4  9.2   Hematocrit 39.0 - 52.0 % 27.0  24.7  27.0   Platelets 150 - 400 K/uL  117        Latest Ref Rng & Units 11/13/2022   11:13 AM 11/13/2022    3:46 AM 11/12/2022    3:31 PM  CMP  Glucose 70 -  99 mg/dL  169    BUN 8 - 23 mg/dL  54    Creatinine 0.61 - 1.24 mg/dL  0.68    Sodium 135 - 145 mmol/L 147  145  147   Potassium 3.5 - 5.1 mmol/L 4.3  4.1    Chloride 98 - 111 mmol/L  103    CO2 22 - 32 mmol/L  36    Calcium 8.9 - 10.3 mg/dL  9.7      CXR: -  _______________________________________________________________  Assessment and Plan: S/P Esophagectomy, with anastomotic leak  Neuro: on sedation CV: will place A line.  Wean levo as tolerated   Pulm: wean vent support as tolerated.   Renal: creat down GI: NPO.  On trickle tube feeds Heme: stable ID: afebrile.  On Merrem and mica.   Endo: SSI Dispo: continue ICU care  Lajuana Matte 11/14/2022 7:44 AM

## 2022-11-14 NOTE — Progress Notes (Signed)
Nutrition Follow-up  DOCUMENTATION CODES:   Severe malnutrition in context of chronic illness  INTERVENTION:   Continue TPN to meet 100% of nutritional needs   NUTRITION DIAGNOSIS:   Severe Malnutrition related to chronic illness (esophageal cancer) as evidenced by severe fat depletion, severe muscle depletion, percent weight loss.  Being addressed via TPN  GOAL:   Patient will meet greater than or equal to 90% of their needs  Met via TPN  MONITOR:   Vent status, Labs, Weight trends, TF tolerance, Skin, I & O's, TPN  REASON FOR ASSESSMENT:   Consult Enteral/tube feeding initiation and management  ASSESSMENT:   62 y.o. male admits related to SOB and generalized weakness. PMH includes esophageal cancer. Pt is currently receiving medical management for fevers and failure to thrive.  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, larger fistula just above and including the anastomosis, fistula below the anastomosis, esophageal stent placed, esophageal prosthesis placed 12/27 - TF held for CT C/A/P 01/02 - TF changed to 18-hour feeds 01/09 - TF changed to 20-hour feeds due to fullness and N/V 01/20 - rapid response due to SOB and increased oxygen requirements 01/21 - intubated, s/p bronch showing copious bile-colored secretions throughout R lung 01/22 - TF turned down to trickle rate (20 ml/hr) 01/25 - began titration of TF back to goal 01/26 - TPN initiated, TF placed on hold, increased free water 01/27 - CT C/A/P:  Large-volume pneumoperitoneum in upper abdomen, increased body wall edema and mesenteric edema, possible ileus in colon, moderate retained stool.   Pt remains on vent support, sedated, levophed at 15  TPN at goal of 80 ml/hr providing 2614 kcals, 138 g of protein.  TF remains on  hold, J-tube site continues to leak.   Constipation has resolved, +type 7 stool, rectal tube now in placed. Noted pt received NuLYTLEY solution and neostigmine on Sunday. Received relistor on 1/26, SMOG enema on 1/25.   Hypernatremia has improved but remains elevated, sodium 147  Noted free water flushes have been discontinued. No sodium in TPN, Remains on lasix, no other free water other than what is provided in TPN. Discussed with Dr. Tamala Julian and MD to order some free water flushes  Noted UOP 5.5 L in 24 hours, 2.7 L thus far today. Current wt 83.7 kg, lowest wt this admission 77 kg  Ionized calcium remains elevated, serum calcium 9.5, corrected calcium 11.4 (H)(albumin 1.6)  Labs: Creatinine wdl, BUN 46 (trending down) Meds: laisx, reglan senna, cplace   Diet Order:   Diet Order             Diet NPO time specified  Diet effective now                   EDUCATION NEEDS:   No education needs have been identified at this time  Skin:  Skin Assessment: Skin Integrity Issues: Skin Integrity Issues:: DTI DTI: sacrum Incisions: Leakage around J-tube insertion site  Last BM:  1/29 now with rectal tube post long period of constipation and aggressive bowel regimen  Height:   Ht Readings from Last 1 Encounters:  11/11/22 '5\' 10"'$  (1.778 m)    Weight:   Wt Readings from Last 1 Encounters:  11/13/22 83.7 kg   BMI:  Body mass index is 26.48 kg/m.  Estimated Nutritional Needs:   Kcal:  2600-2900 kcals  Protein:  135-155 g  Fluid:  >/= 2 L  Kerman Passey MS, RDN, LDN, CNSC Registered Dietitian 3 Clinical Nutrition RD Pager and On-Call Pager Number Located in [place]

## 2022-11-14 NOTE — Progress Notes (Signed)
PHARMACY - TOTAL PARENTERAL NUTRITION CONSULT NOTE  Indication: Prolonged ileus  Patient Measurements: Height: '5\' 10"'$  (177.8 cm) Weight: 83.7 kg (184 lb 8.4 oz) IBW/kg (Calculated) : 73 TPN AdjBW (KG): 80 Body mass index is 26.48 kg/m. Usual Weight: 180-190 lbs before admission. Lowest at 170 lb this admit, now likely up due to volume   Assessment:  62 yo M with esophageal cancer and previous robotic assisted Ivor Lewis esophagectomy complicated by anastomotic leak admitted 10/16/2022 from clinic with fevers and purulent drainage from chest tubes. Patient is now s/p esophageal stent with persistent leak. Patient was on goal or near goal tube feeds 10/04/2022 - 11/08/22. On 1/24, TF rate was decreased to trickle due to increased pressor requirement. TF rate titrated up slowly and on 1/26, TF was increased to 50 ml/hr but then started leaking out around patient's PEG tube and from JP drains. Last documented bowel movement was 1/16 (had liquid smear on 1/26), high concern for ileus confirmed on imaging. Pharmacy consulted for TPN.   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 1/26 KUB - Mild prominence of gas in colon,  most of small bowel is gasless.  1/27 CT-  Large-volume pneumoperitoneum in upper abdomen, increased body wall edema and mesenteric edema, possible ileus in colon, moderate retained stool.  GI Surgeries / Procedures:  12/4  Esophagogastroscopy, Repositioned stent , R VATS 12/13 EGD with stent removal    Central access: PICC placed 11/06/22 TPN start date: 11/11/22   Nutritional Goals:  RD Estimated Needs Total Energy Estimated Needs: 2600-2900 kcals Total Protein Estimated Needs: 135-155 g Total Fluid Estimated Needs: >/= 2 L  Current Nutrition:  TPN   Plan:  Continue concentrated TPN at goal rate of 80 mL/hr to provide 138g AA, 374g CHO and 2614 kCal, meeting 100% of needs Electrolytes in TPN: Na 73m16mL, reduce K back to 473m58m, Ca 73mEq81m Mg 5mEq/36mreduce Phos slightly to 13mmol57mmax CL Add standard MVI and trace elements to TPN Continue moderate SSI Q8H and 60 units insulin in TPN Monitor TPN labs daily until stable at goal then on Mon/Thurs Monitor Na and the need to restart free water - albumin/metolazone per MD  Bryttney Netzer D.Remigio Eisenmengerg, PMina MarbleD, BCPS, BCCCP 1[id number], 9:14 AM

## 2022-11-15 ENCOUNTER — Inpatient Hospital Stay (HOSPITAL_COMMUNITY): Payer: No Typology Code available for payment source

## 2022-11-15 DIAGNOSIS — J869 Pyothorax without fistula: Secondary | ICD-10-CM | POA: Diagnosis not present

## 2022-11-15 LAB — COMPREHENSIVE METABOLIC PANEL
ALT: 45 U/L — ABNORMAL HIGH (ref 0–44)
AST: 38 U/L (ref 15–41)
Albumin: 2.7 g/dL — ABNORMAL LOW (ref 3.5–5.0)
Alkaline Phosphatase: 80 U/L (ref 38–126)
Anion gap: 7 (ref 5–15)
BUN: 40 mg/dL — ABNORMAL HIGH (ref 8–23)
CO2: 45 mmol/L — ABNORMAL HIGH (ref 22–32)
Calcium: 10.1 mg/dL (ref 8.9–10.3)
Chloride: 96 mmol/L — ABNORMAL LOW (ref 98–111)
Creatinine, Ser: 0.59 mg/dL — ABNORMAL LOW (ref 0.61–1.24)
GFR, Estimated: >60 mL/min (ref >60)
Glucose, Bld: 100 mg/dL — ABNORMAL HIGH (ref 70–99)
Potassium: 4.1 mmol/L (ref 3.5–5.1)
Sodium: 148 mmol/L — ABNORMAL HIGH (ref 135–145)
Total Bilirubin: 0.7 mg/dL (ref 0.3–1.2)
Total Protein: 5.4 g/dL — ABNORMAL LOW (ref 6.5–8.1)

## 2022-11-15 LAB — GLUCOSE, CAPILLARY
Glucose-Capillary: 112 mg/dL — ABNORMAL HIGH (ref 70–99)
Glucose-Capillary: 112 mg/dL — ABNORMAL HIGH (ref 70–99)
Glucose-Capillary: 127 mg/dL — ABNORMAL HIGH (ref 70–99)
Glucose-Capillary: 137 mg/dL — ABNORMAL HIGH (ref 70–99)
Glucose-Capillary: 148 mg/dL — ABNORMAL HIGH (ref 70–99)
Glucose-Capillary: 98 mg/dL (ref 70–99)

## 2022-11-15 LAB — CBC
HCT: 21.6 % — ABNORMAL LOW (ref 39.0–52.0)
Hemoglobin: 6.4 g/dL — CL (ref 13.0–17.0)
MCH: 31.1 pg (ref 26.0–34.0)
MCHC: 29.6 g/dL — ABNORMAL LOW (ref 30.0–36.0)
MCV: 104.9 fL — ABNORMAL HIGH (ref 80.0–100.0)
Platelets: 70 10*3/uL — ABNORMAL LOW (ref 150–400)
RBC: 2.06 MIL/uL — ABNORMAL LOW (ref 4.22–5.81)
RDW: 19.7 % — ABNORMAL HIGH (ref 11.5–15.5)
WBC: 11.9 10*3/uL — ABNORMAL HIGH (ref 4.0–10.5)
nRBC: 0.2 % (ref 0.0–0.2)

## 2022-11-15 LAB — PREPARE RBC (CROSSMATCH)

## 2022-11-15 LAB — PHOSPHORUS: Phosphorus: 3.2 mg/dL (ref 2.5–4.6)

## 2022-11-15 LAB — PROCALCITONIN: Procalcitonin: 2.31 ng/mL

## 2022-11-15 LAB — MAGNESIUM: Magnesium: 1.9 mg/dL (ref 1.7–2.4)

## 2022-11-15 LAB — CORTISOL: Cortisol, Plasma: 14.3 ug/dL

## 2022-11-15 MED ORDER — METOLAZONE 5 MG PO TABS
5.0000 mg | ORAL_TABLET | Freq: Once | ORAL | Status: AC
  Administered 2022-11-15: 5 mg
  Filled 2022-11-15: qty 1

## 2022-11-15 MED ORDER — SODIUM CHLORIDE 0.9% IV SOLUTION: Freq: Once | INTRAVENOUS | Status: AC

## 2022-11-15 MED ORDER — STERILE WATER FOR INJECTION IJ SOLN
INTRAMUSCULAR | Status: AC
  Filled 2022-11-15: qty 10

## 2022-11-15 MED ORDER — POTASSIUM CHLORIDE 20 MEQ PO PACK
40.0000 meq | PACK | Freq: Once | ORAL | Status: AC
  Administered 2022-11-15: 40 meq
  Filled 2022-11-15: qty 2

## 2022-11-15 MED ORDER — CLONAZEPAM 1 MG PO TABS
1.0000 mg | ORAL_TABLET | Freq: Three times a day (TID) | ORAL | Status: DC
  Administered 2022-11-15 – 2022-11-16 (×4): 1 mg via ORAL
  Filled 2022-11-15 (×4): qty 1

## 2022-11-15 MED ORDER — TRACE MINERALS CU-MN-SE-ZN 300-55-60-3000 MCG/ML IV SOLN
INTRAVENOUS | Status: AC
  Filled 2022-11-15: qty 921.6

## 2022-11-15 MED ORDER — OXYCODONE HCL 5 MG PO TABS
5.0000 mg | ORAL_TABLET | Freq: Four times a day (QID) | ORAL | Status: DC
  Administered 2022-11-15 – 2022-11-17 (×8): 5 mg
  Filled 2022-11-15 (×8): qty 1

## 2022-11-15 MED ORDER — INSULIN ASPART 100 UNIT/ML IJ SOLN
0.0000 [IU] | Freq: Three times a day (TID) | INTRAMUSCULAR | Status: DC
  Administered 2022-11-15 – 2022-11-16 (×2): 1 [IU] via SUBCUTANEOUS

## 2022-11-15 MED ORDER — MAGNESIUM SULFATE 2 GM/50ML IV SOLN
2.0000 g | Freq: Once | INTRAVENOUS | Status: AC
  Administered 2022-11-15: 2 g via INTRAVENOUS
  Filled 2022-11-15: qty 50

## 2022-11-15 NOTE — Consult Note (Signed)
[facility] Nurse wound follow up Wound type: Deep Tissue Pressure Injury Coccyx   Measurement: 2 cms x 1 cms  Wound bed: purple maroon discoloration, intact skin  Drainage (amount, consistency, odor) none Periwound: intact  Dressing procedure/placement/frequency: Continue with Xeroform gauze Kellie Simmering [phone]) daily as per previous order.  This was performed with bedside nurse at this visit.    [place] nursing team will continue to follow this patient every 7-10 days.    Thank you,    Cailean Heacock MSN, RN-BC, [employer]

## 2022-11-15 NOTE — Progress Notes (Signed)
NAME:  [name] [name], MRN:  [id number], DOB:  [birth year], LOS: 60 ADMISSION DATE:  10/05/2022, CONSULTATION DATE: 11/06/2022 REFERRING MD: Dr. 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  Overnight patient became short of breath, started spiking fever so he was transferred to ICU.  This morning patient was started spiking fever to 103, remained tachycardic and tachypneic, PCCM was called for help evaluation medical management  Pertinent  Medical History   Past Medical History:  Diagnosis Date   Cancer [facility])    Esophageal Cancer   History of radiation therapy    Esophagus- 05/12/22-06/22/22- Dr. Gery Pray   Port-A-Cath in place 05/11/2022   Significant Hospital Events: Including procedures, antibiotic start and stop dates in addition to other pertinent events   12/1 admitted for drainage from empyema 12/13 EGD with stent placement. 1/26 started on TPN, still not stooling.   Interim History / Subjective:  Pressor needs are down Appears uncomfortable on vent  Objective   Blood pressure (!) 106/55, pulse 80, temperature 98.6 F (37 C), temperature source Oral, resp. rate (!) 24, height '5\' 10"'$  (1.778 m), weight 85.3 kg, SpO2 95 %.    Vent Mode: PRVC FiO2 (%):  [60 %] 60 % Set Rate:  [25 bmp] 25 bmp Vt Set:  [560 mL] 560 mL PEEP:  [10 cmH20] 10 cmH20 Plateau Pressure:  [25 cmH20-33 cmH20] 33 cmH20   Intake/Output Summary (Last 24 hours) at 11/15/2022 0751 Last data filed at 11/15/2022 0700 Gross per 24 hour  Intake 3704.21 ml  Output 8790 ml  Net -5085.79 ml    Filed Weights   11/12/22 0500 11/13/22 0500 11/15/22 0400  Weight: 84.3 kg 83.7 kg 85.3 kg    Examination:  Sedated on vent but more  tachypneic with versed wean Multiple JP drains over R chest Harsh rhonci bilaterally Ext warm +anasarca stable +crepitus L chest stable   CXR dense L sided PNA, stable pneumoperitoneum, pneumomediastinum  Cortisol 14, less c/w insufficiency  Pct 2.3  Assessment & Plan:  Acute hypoxic and hypercapnic respiratory failure due E coli Aspiration pneumonia-LUL, RLL with ARDS Septic shock due to bilateral multifocal pneumonia Right-sided empyema s/p VATS, currently with chest tube placed Acute septic encephalopathy Moderate protein energy malnutrition Constipation- improved Afib new onset- resolved, hold on AC for now Anemia of chronic disease- worsened during stay, getting unit today, no s/s of bleeding Adenocarcinoma esophagus s/p esophagectomy, adjuvant chemoradiation Anastomotic leak s/p esophageal stent: ongoing issue; eventual potential revision at [facility] if can get recovered Hyperglycemia due to acute illness, steroids. A1c is 4.3  - Start enteral oxycodone and klonipin, wean; wean fent/versed titrating to vent synchrony 1st and RASS -1 2nd - Lung protective tidal volumes limiting driving pressures to < 15cm H2O as able - Sedation titrated to vent compliance and patient comfort using PAD orderset - VAP prevention bundle - Daily SAT/SBT when meets institutional criteria: O2/PEEP needs still too high and lungs too stiff: DP is 23+ cmH2O - Meropenem and micafungin until Feb 9 per ID - Push diuresis, add another dose metolazone - Wean levophed for MAP 65 - Continue midodrine - Check another Pct tomorrow  Discussed during IDR with Dr. Kipp Brood  Best Practice (right click and "Reselect all SmartList Selections" daily)   Diet/type: NPO and TPN  DVT prophylaxis: prophylactic heparin  GI prophylaxis: PPI Lines: Central line and yes and it is still needed-picc Foley:  N/A Code Status:  DNR Last date of multidisciplinary goals of care discussion [1/21: With the patient's wife,  please see Ipal note]  32 min cc time Erskine Emery MD PCCM

## 2022-11-15 NOTE — Progress Notes (Signed)
Patient ID: [name] [name], male   DOB: 1961/04/21, 62 y.o.   MRN: [id number]  TCTS Evening Rounds:  Hemodynamically stable on less NE today, 6 mcg.  Remains on vent. 60% FiO2  Good urine output.  Transfused today for Hgb 6.4 this am.

## 2022-11-15 NOTE — Progress Notes (Signed)
[address]       [place] [address]             [phone]                 48 Days Post-Op Procedure(s) (LRB): ESOPHAGOGASTRODUODENOSCOPY (EGD) WITH PROPOFOL (N/A) ESOPHAGEAL STENT PLACEMENT (N/A) STENT REMOVAL   Events: No events  _______________________________________________________________ Vitals: BP (!) 106/55   Pulse 80   Temp 98.6 F (37 C) (Oral)   Resp (!) 24   Ht _0  (1.778 m)   Wt 85.3 kg   SpO2 95%   BMI 26.98 kg/m  Filed Weights   11/12/22 0500 11/13/22 0500 11/15/22 0400  Weight: 84.3 kg 83.7 kg 85.3 kg     - Neuro: sedated  - Cardiovascular: sinus ta  Drips: levo 6    - Pulm: minimal CT output Vent Mode: PRVC FiO2 (%):  [60 %] 60 % Set Rate:  [25 bmp] 25 bmp Vt Set:  [560 mL] 560 mL PEEP:  [10 cmH20] 10 cmH20 Plateau Pressure:  [33 cmH20] 33 cmH20  ABG    Component Value Date/Time   PHART 7.321 (L) 11/13/2022 1113   PCO2ART 78.6 (HH) 11/13/2022 1113   PO2ART 56 (L) 11/13/2022 1113   HCO3 40.6 (H) 11/13/2022 1113   TCO2 43 (H) 11/13/2022 1113   ACIDBASEDEF 3.0 (H) 11/08/2022 0238   O2SAT 84 11/13/2022 1113    - Abd: ND - Extremity: warm  .Intake/Output      01/29 0701 01/30 0700 01/30 0701 01/31 0700   I.V. (mL/kg) 2840.2 (33.3)    Other 250    NG/GT 0    IV Piggyback 614    Total Intake(mL/kg) 3704.2 (43.4)    Urine (mL/kg/hr) 7875 (3.8)    Drains 45    Stool 870    Total Output 8790    Net -5085.8            _______________________________________________________________ Labs:    Latest Ref Rng & Units 11/15/2022    3:40 AM 11/13/2022   11:13 AM 11/13/2022    3:46 AM  CBC  WBC 4.0 - 10.5 K/uL 11.9   16.5   Hemoglobin 13.0 - 17.0 g/dL 6.4  9.2  7.4   Hematocrit 39.0 - 52.0 % 21.6  27.0  24.7   Platelets 150 - 400 K/uL 70   117       Latest Ref Rng & Units 11/15/2022    3:40 AM 11/14/2022    5:40 AM 11/13/2022   11:13 AM  CMP  Glucose 70 - 99 mg/dL 100  131    BUN 8 - 23 mg/dL  40  46    Creatinine 0.61 - 1.24 mg/dL 0.59  0.64    Sodium 135 - 145 mmol/L 148  147  147   Potassium 3.5 - 5.1 mmol/L 4.1  4.8  4.3   Chloride 98 - 111 mmol/L 96  102    CO2 22 - 32 mmol/L 45  33    Calcium 8.9 - 10.3 mg/dL 10.1  9.5    Total Protein 6.5 - 8.1 g/dL 5.4  5.0    Total Bilirubin 0.3 - 1.2 mg/dL 0.7  0.6    Alkaline Phos 38 - 126 U/L 80  93    AST 15 - 41 U/L 38  49    ALT 0 - 44 U/L 45  70      CXR: -  _______________________________________________________________  Assessment and Plan: S/P Esophagectomy, with anastomotic leak  Neuro: on sedation CV: will place A line.  Wean levo as tolerated   Pulm: wean vent support as tolerated.   Renal: creat down GI: NPO.  On trickle tube feeds Heme: stable ID: afebrile.  On Merrem and mica.   Endo: SSI Dispo: continue ICU care   Lajuana Matte 11/15/2022 8:38 AM

## 2022-11-15 NOTE — Progress Notes (Signed)
PHARMACY - TOTAL PARENTERAL NUTRITION CONSULT NOTE  Indication: Prolonged ileus  Patient Measurements: Height: '5\' 10"'$  (177.8 cm) Weight: 85.3 kg (188 lb 0.8 oz) IBW/kg (Calculated) : 73 TPN AdjBW (KG): 80 Body mass index is 26.98 kg/m. Usual Weight: 180-190 lbs before admission. Lowest at 170 lb this admit, now likely up due to volume   Assessment:  62 yo M with esophageal cancer and previous robotic assisted Ivor Lewis esophagectomy complicated by anastomotic leak admitted 10/16/2022 from clinic with fevers and purulent drainage from chest tubes. Patient is now s/p esophageal stent with persistent leak. Patient was on goal or near goal tube feeds 09/20/2022 - 11/08/22. On 1/24, TF rate was decreased to trickle due to increased pressor requirement. TF rate titrated up slowly and on 1/26, TF was increased to 50 ml/hr but then started leaking out around patient's PEG tube and from JP drains. Last documented bowel movement was 1/16 (had liquid smear on 1/26), high concern for ileus confirmed on imaging. Pharmacy consulted for TPN.   Glucose / Insulin: no hx DM - CBGs now tightly controlled Used 7 units SSI in the past 24 hrs Electrolytes: Na up to 148 (none in TPN, FW restarted 274m TID), CO2 up to 45 (maxCl in TPN), low CL, CoCa elevated at 11.14 (none in TPN), Mag 1.9 (goal >/= 2), others WNL  Renal: SCr < 1, BUN down to 40 Hepatic: LFTs WNL except mildly elevated ALT, tbili WNL, albumin 1.6, TG down to 129 Intake / Output; MIVF: UOP 3.8 ml/kg/hr with Lasix '60mg'$  IV BID and metolazone, drains 437m stool 87038mLBM 1/28, net +47L (down) 1/25 SMOG enema x1 1/26 Relistor x1   1/28 Neostigmine x3 doses, Nulytely 2L GI Imaging: 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 1/26 KUB - Mild prominence of gas in colon, most of small bowel is gasless.  1/27 CT-  Large-volume pneumoperitoneum in upper abdomen, increased body wall edema and mesenteric edema, possible ileus in colon, moderate retained stool.  GI Surgeries / Procedures:  12/4  Esophagogastroscopy, Repositioned stent , R VATS 12/13 EGD with stent removal    Central access: PICC placed 11/06/22 TPN start date: 11/11/22   Nutritional Goals:  RD Estimated Needs Total Energy Estimated Needs: 2600258-5277als Total Protein Estimated Needs: 135-155 g Total Fluid Estimated Needs: >/= 2 L  Current Nutrition:  TPN   Plan:  Continue concentrated TPN at goal rate of 80 mL/hr to provide 138g AA, 374g CHO and 2614 kCal, meeting 100% of needs Electrolytes in TPN: Na 0mE74m, K 40mE59m Ca 0mEq/16mMag 5mEq/L45meduce Phos slightly to 13mmol/26m 1/29, max CL Add standard MVI and trace elements to TPN Reduce SSI to sensitive Q8H and insulin in TPN to 55 units  Mag sulfate 2gm IV and KCL 40mEq PT40m to account for diuresis Monitor TPN labs on Mon/Thurs - might need to increase free water in AM F/U with ability to trial EN  Masiel Gentzler D. Jocelyn Nold, PhaMina Marble BCPS, BCCCP 1/3Branch4, 9:21 AM

## 2022-11-15 NOTE — Progress Notes (Signed)
[place] Progress Note Patient Name: [name] [name] DOB: 10/21/1960 MRN: [id number]   Date of Service  11/15/2022  HPI/Events of Note  Acute on chronic hypercapnic resp failure, shock, afib with anticoagulation held - low hemoglobin this morning with no signs of hemodynamic distress or bleedinh  eICU Interventions  Repeat T&S, 1 unit of pRBCs ordered     Intervention Category Intermediate Interventions: Bleeding - evaluation and treatment with blood products  Erminie Foulks 11/15/2022, 4:38 AM

## 2022-11-16 DIAGNOSIS — J869 Pyothorax without fistula: Secondary | ICD-10-CM | POA: Diagnosis not present

## 2022-11-16 LAB — TYPE AND SCREEN
ABO/RH(D): O NEG
Antibody Screen: NEGATIVE
Unit division: 0

## 2022-11-16 LAB — BPAM RBC
Blood Product Expiration Date: 202402122359
ISSUE DATE / TIME: 202401300552
Unit Type and Rh: 9500

## 2022-11-16 LAB — COMPREHENSIVE METABOLIC PANEL
ALT: 37 U/L (ref 0–44)
AST: 40 U/L (ref 15–41)
Albumin: 2.4 g/dL — ABNORMAL LOW (ref 3.5–5.0)
Alkaline Phosphatase: 89 U/L (ref 38–126)
BUN: 44 mg/dL — ABNORMAL HIGH (ref 8–23)
CO2: >45 mmol/L — ABNORMAL HIGH (ref 22–32)
Calcium: 10.1 mg/dL (ref 8.9–10.3)
Chloride: 85 mmol/L — ABNORMAL LOW (ref 98–111)
Creatinine, Ser: 0.64 mg/dL (ref 0.61–1.24)
GFR, Estimated: >60 mL/min (ref >60)
Glucose, Bld: 129 mg/dL — ABNORMAL HIGH (ref 70–99)
Potassium: 4.6 mmol/L (ref 3.5–5.1)
Sodium: 143 mmol/L (ref 135–145)
Total Bilirubin: 0.9 mg/dL (ref 0.3–1.2)
Total Protein: 5.7 g/dL — ABNORMAL LOW (ref 6.5–8.1)

## 2022-11-16 LAB — CBC
HCT: 27.2 % — ABNORMAL LOW (ref 39.0–52.0)
Hemoglobin: 8 g/dL — ABNORMAL LOW (ref 13.0–17.0)
MCH: 30.3 pg (ref 26.0–34.0)
MCHC: 29.4 g/dL — ABNORMAL LOW (ref 30.0–36.0)
MCV: 103 fL — ABNORMAL HIGH (ref 80.0–100.0)
Platelets: 78 10*3/uL — ABNORMAL LOW (ref 150–400)
RBC: 2.64 MIL/uL — ABNORMAL LOW (ref 4.22–5.81)
RDW: 20.6 % — ABNORMAL HIGH (ref 11.5–15.5)
WBC: 14.4 10*3/uL — ABNORMAL HIGH (ref 4.0–10.5)
nRBC: 0 % (ref 0.0–0.2)

## 2022-11-16 LAB — GLUCOSE, CAPILLARY
Glucose-Capillary: 145 mg/dL — ABNORMAL HIGH (ref 70–99)
Glucose-Capillary: 138 mg/dL — ABNORMAL HIGH (ref 70–99)
Glucose-Capillary: 154 mg/dL — ABNORMAL HIGH (ref 70–99)
Glucose-Capillary: 156 mg/dL — ABNORMAL HIGH (ref 70–99)
Glucose-Capillary: 149 mg/dL — ABNORMAL HIGH (ref 70–99)

## 2022-11-16 LAB — POCT I-STAT 7, (LYTES, BLD GAS, ICA,H+H)
Acid-Base Excess: 27 mmol/L — ABNORMAL HIGH (ref 0.0–2.0)
Bicarbonate: 56.2 mmol/L — ABNORMAL HIGH (ref 20.0–28.0)
Calcium, Ion: 1.31 mmol/L (ref 1.15–1.40)
HCT: 28 % — ABNORMAL LOW (ref 39.0–52.0)
Hemoglobin: 9.5 g/dL — ABNORMAL LOW (ref 13.0–17.0)
O2 Saturation: 97 %
Patient temperature: 98.7
Potassium: 4.4 mmol/L (ref 3.5–5.1)
Sodium: 137 mmol/L (ref 135–145)
TCO2: >50 mmol/L — ABNORMAL HIGH (ref 22–32)
pCO2 arterial: 93.5 mmHg (ref 32–48)
pH, Arterial: 7.387 (ref 7.35–7.45)
pO2, Arterial: 103 mmHg (ref 83–108)

## 2022-11-16 LAB — PROCALCITONIN: Procalcitonin: 1.7 ng/mL

## 2022-11-16 LAB — MAGNESIUM: Magnesium: 2.2 mg/dL (ref 1.7–2.4)

## 2022-11-16 LAB — AMMONIA: Ammonia: 37 umol/L — ABNORMAL HIGH (ref 9–35)

## 2022-11-16 LAB — PHOSPHORUS: Phosphorus: 3.6 mg/dL (ref 2.5–4.6)

## 2022-11-16 MED ORDER — VITAL AF 1.2 CAL PO LIQD
1000.0000 mL | ORAL | Status: DC
  Administered 2022-11-16: 1000 mL

## 2022-11-16 MED ORDER — CLONAZEPAM 1 MG PO TABS
1.0000 mg | ORAL_TABLET | Freq: Three times a day (TID) | ORAL | Status: DC
  Administered 2022-11-16 – 2022-11-17 (×3): 1 mg
  Filled 2022-11-16 (×3): qty 1

## 2022-11-16 MED ORDER — TRACE MINERALS CU-MN-SE-ZN 300-55-60-3000 MCG/ML IV SOLN
INTRAVENOUS | Status: AC
  Filled 2022-11-16: qty 921.6

## 2022-11-16 MED ORDER — INSULIN ASPART 100 UNIT/ML IJ SOLN
0.0000 [IU] | Freq: Three times a day (TID) | INTRAMUSCULAR | Status: DC
  Administered 2022-11-16: 2 [IU] via SUBCUTANEOUS
  Administered 2022-11-17: 1 [IU] via SUBCUTANEOUS
  Administered 2022-11-17: 2 [IU] via SUBCUTANEOUS
  Administered 2022-11-17 – 2022-11-18 (×3): 1 [IU] via SUBCUTANEOUS
  Administered 2022-11-18 – 2022-11-19 (×3): 2 [IU] via SUBCUTANEOUS

## 2022-11-16 MED ORDER — VITAL HIGH PROTEIN PO LIQD: 1000.0000 mL | ORAL | Status: DC

## 2022-11-16 MED ORDER — DEXMEDETOMIDINE HCL IN NACL 400 MCG/100ML IV SOLN
0.0000 ug/kg/h | INTRAVENOUS | Status: DC
  Administered 2022-11-16: 0.3 ug/kg/h via INTRAVENOUS
  Administered 2022-11-18: 0.4 ug/kg/h via INTRAVENOUS
  Administered 2022-11-18: 0.6 ug/kg/h via INTRAVENOUS
  Administered 2022-11-18: 0.4 ug/kg/h via INTRAVENOUS
  Administered 2022-11-19: 1 ug/kg/h via INTRAVENOUS
  Administered 2022-11-19: 0.3 ug/kg/h via INTRAVENOUS
  Filled 2022-11-16 (×6): qty 100

## 2022-11-16 MED ORDER — ACETAZOLAMIDE 250 MG PO TABS
500.0000 mg | ORAL_TABLET | Freq: Two times a day (BID) | ORAL | Status: AC
  Administered 2022-11-16 (×2): 500 mg via ORAL
  Filled 2022-11-16 (×2): qty 2

## 2022-11-16 NOTE — Progress Notes (Addendum)
PHARMACY - TOTAL PARENTERAL NUTRITION CONSULT NOTE  Indication: Prolonged ileus  Patient Measurements: Height: '5\' 10"'$  (177.8 cm) Weight: 83.4 kg (183 lb 13.8 oz) IBW/kg (Calculated) : 73 TPN AdjBW (KG): 80 Body mass index is 26.38 kg/m. Usual Weight: 180-190 lbs before admission. Lowest at 170 lb this admit, now likely up due to volume   Assessment:  62 yo M with esophageal cancer and previous robotic assisted Ivor Lewis esophagectomy complicated by anastomotic leak admitted 09/24/2022 from clinic with fevers and purulent drainage from chest tubes. Patient is now s/p esophageal stent with persistent leak. Patient was on goal or near goal tube feeds 09/27/2022 - 11/08/22. On 1/24, TF rate was decreased to trickle due to increased pressor requirement. TF rate titrated up slowly and on 1/26, TF was increased to 50 ml/hr but then started leaking out around patient's PEG tube and from JP drains. Last documented bowel movement was 1/16 (had liquid smear on 1/26), high concern for ileus confirmed on imaging. Pharmacy consulted for TPN.   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 1/26 KUB - Mild prominence of gas in colon, most of  small bowel is gasless.  1/27 CT-  Large-volume pneumoperitoneum in upper abdomen, increased body wall edema and mesenteric edema, possible ileus in colon, moderate retained stool.  GI Surgeries / Procedures:  12/4  Esophagogastroscopy, Repositioned stent , R VATS 12/13 EGD with stent removal    Central access: PICC placed 11/06/22 TPN start date: 11/11/22   Nutritional Goals:  RD Estimated Needs Total Energy Estimated Needs: 263888-2800cals Total Protein Estimated Needs: 135-155 g Total Fluid Estimated Needs: >/= 2 L  Current Nutrition:  TPN   Plan:  Continue concentrated TPN at goal rate of 80 mL/hr to provide 138g AA, 374g CHO and 2614 kCal, meeting 100% of needs Electrolytes in TPN: Na 16m58mL, K 416m64m, Ca 16mEq44m Mag 5mEq/77mPhos 13mmol37m max CL Add standard MVI and trace elements to TPN Reduce SSI to sensitive Q8H and insulin in TPN to 52 units  Monitor TPN labs on Mon/Thurs  Monitor free water  F/U with ability to trial EN  Cathy PAlanda SlimD, FCCM ClWhite Fence Surgical Suites LLCal Pharmacist Please see AMION for all Pharmacists' Contact Phone Numbers 11/16/2022, 7:33 AM

## 2022-11-16 NOTE — Progress Notes (Signed)
NAME:  [name] [name], MRN:  [id number], DOB:  1960-12-06, LOS: 11 ADMISSION DATE:  10/12/2022, CONSULTATION DATE: 11/06/2022 REFERRING MD: Dr. 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  Overnight patient became short of breath, started spiking fever so he was transferred to ICU.  This morning patient was started spiking fever to 103, remained tachycardic and tachypneic, PCCM was called for help evaluation medical management  Pertinent  Medical History   Past Medical History:  Diagnosis Date   Cancer [facility])    Esophageal Cancer   History of radiation therapy    Esophagus- 05/12/22-06/22/22- Dr. Gery Pray   Port-A-Cath in place 05/11/2022   Significant Hospital Events: Including procedures, antibiotic start and stop dates in addition to other pertinent events   12/1 admitted for drainage from empyema 12/13 EGD with stent placement. 1/21 intubated for aspiration 1/26 started on TPN, still not stooling.  Interim History / Subjective:  Off pressors. O2/PEEP needs stubborn despite diuresis.  Objective   Blood pressure (!) 140/80, pulse 87, temperature 98 F (36.7 C), temperature source Axillary, resp. rate (!) 27, height '5\' 10"'$  (1.778 m), weight 83.4 kg, SpO2 93 %.    Vent Mode: PRVC FiO2 (%):  [60 %] 60 % Set Rate:  [25 bmp] 25 bmp Vt Set:  [560 mL] 560 mL PEEP:  [8 cmH20-10 cmH20] 10 cmH20 Plateau Pressure:  [29 cmH20-37 cmH20] 29 cmH20   Intake/Output Summary (Last 24 hours) at 11/16/2022 0817 Last data filed at 11/16/2022 0700 Gross per 24 hour  Intake 2874.51 ml  Output 5565 ml  Net -2690.49 ml    Filed Weights   11/13/22 0500 11/15/22 0400 11/16/22 0242  Weight: 83.7 kg 85.3 kg 83.4 kg     Examination: Triggering vent Not following commands, versed weaned yesterday Anasarca improving Ext warm Multiple JP drains with gastric secretion type output on R Not following commands for me Pupils less small than yesterday, briskly reactive  H/H ok No CXR Worsening alkalosis on BMP  Assessment & Plan:  Acute hypoxic and hypercapnic respiratory failure due E coli Aspiration pneumonia-LUL, RLL with ARDS; working on diuresis but really having trouble weaning O2 due to severity of lung disease.  Pct improving with current abx regimen. Septic shock due to bilateral multifocal pneumonia; improved Right-sided empyema s/p VATS, currently with chest tube placed and ongoing output from anastomotic leak Acute septic encephalopathy- now complicated by high dose sedation needed during initial recovery period for vent dys-synchrony, will monitor Moderate protein energy malnutrition Constipation- improved Afib new onset- resolved, hold on [facility] for now Anemia of chronic disease- worsened during stay, got unit 1/30 with good response.  No s/s of bleeding. Adenocarcinoma esophagus s/p esophagectomy, adjuvant chemoradiation Anastomotic leak s/p esophageal stent: ongoing issue; eventual potential revision at [facility] if can get recovered Hyperglycemia due to acute illness, steroids. A1c is 4.3  - enteral oxycodone and klonipin, stop fent drip, use precedex if a drip needed; may need to lighten PO meds if still comatose tomorrow - Lung protective tidal volumes limiting driving pressures to < 15cm H2O as able - Sedation titrated to vent compliance and patient comfort using PAD orderset - VAP prevention bundle - Daily SAT/SBT when meets institutional criteria: O2/PEEP needs still too high and lungs too stiff - Meropenem and micafungin  until Feb 9 per ID - Push diuresis, add dose of diamox - Continue midodrine  Discussed during IDR with Dr. Kipp Brood: give through week then likely [place] talk later if  cannot get off vent  Best Practice (right click and "Reselect all SmartList Selections" daily)   Diet/type: TF challenge, TPN DVT prophylaxis: prophylactic heparin  GI prophylaxis: PPI Lines: Central line and yes and it is still needed-picc Foley:  N/A Code Status:  DNR Last date of multidisciplinary goals of care discussion [1/21: With the patient's wife, please see Ipal note]  33 min cc time Erskine Emery MD PCCM

## 2022-11-16 NOTE — Progress Notes (Signed)
Nutrition Follow-up  DOCUMENTATION CODES:   Severe malnutrition in context of chronic illness  INTERVENTION:   TPN per Pharmacy to meet 100% nutritional needs. TPN should not be adjusted for nutrition being provided via TF at this time. Not even sure pt will be able to tolerate TF  Tube Feeding via J-tube Trial Vital 1.2 AF at 20 ml/hr  (no titration past trickles)  Monitor for increased drainage from around J-tube site; frequent dressing changes if need to keep skin dry   NUTRITION DIAGNOSIS:   Severe Malnutrition related to chronic illness (esophageal cancer) as evidenced by severe fat depletion, severe muscle depletion, percent weight loss.  Being addressed via nutrition support  GOAL:   Patient will meet greater than or equal to 90% of their needs  Addressed via nutrition support  MONITOR:   Vent status, Labs, Weight trends, TF tolerance, Skin, I & O's  REASON FOR ASSESSMENT:   Consult Enteral/tube feeding initiation and management  ASSESSMENT:   62 y.o. male admits related to SOB and generalized weakness. PMH includes esophageal cancer. Pt is currently receiving medical management for fevers and failure to thrive.  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, larger fistula just above and including the anastomosis, fistula below the anastomosis, esophageal stent placed, esophageal prosthesis placed 12/27 - TF held for CT C/A/P 01/02 - TF changed to 18-hour feeds 01/09 - TF changed to 20-hour feeds due to fullness and N/V 01/20 - rapid response due to SOB and increased oxygen requirements 01/21 - intubated, s/p bronch showing copious bile-colored secretions throughout R lung 01/22 - TF turned down to trickle rate (20 ml/hr) 01/25 - began titration of TF back to goal 01/26 - TPN  initiated, TF placed on hold, increased free water 01/27 - CT C/A/P:  Large-volume pneumoperitoneum in upper abdomen, increased body wall edema and mesenteric edema, possible ileus in colon, moderate retained stool.   Pt remains on vent support, off pressors, sedation meds being adjusted.  O2/PEEP still to high for vent weaning  TPN at 80 ml/hr providing 2614 kcals and 138 g of protein. 374 g CHO (48% calories from TPN in form of carbohydrate) Rectal tube in place  Dr. Tamala Julian requesting trial of trickle TF.   UOP 5.5 L in 24 hours  JP drains with gastric looking output +stool via rectal tube but only  Electrolytes wdl, hypernatremia has corrected, corrected calcium for albumin 2.4 is 11.4 but ionized calcium wdl  Labs: sodium 137 (wdl), CBGs 112-154 Meds: reglan, ss novolog, colace, lasix, ss novolog   Diet Order:   Diet Order             Diet NPO time specified  Diet effective now                   EDUCATION NEEDS:   No education needs have been identified at this time  Skin:  Skin Assessment: Skin Integrity Issues: Skin Integrity Issues:: DTI DTI: sacrum Incisions: Leakage around J-tube insertion site  Last BM:  1/31 rectal tube  Height:   Ht Readings from Last 1 Encounters:  11/11/22 '5\' 10"'$  (1.778 m)    Weight:   Wt Readings from Last 1 Encounters:  11/16/22 83.4 kg   BMI:  Body mass index is 26.38 kg/m.  Estimated Nutritional Needs:   Kcal:  1884-1660 kcals  Protein:  135-155 g  Fluid:  >/= 2 L   Kerman Passey MS, RDN, LDN, CNSC Registered Dietitian 3 Clinical Nutrition RD Pager and On-Call Pager Number Located in [place]

## 2022-11-16 NOTE — Progress Notes (Signed)
[address]       [place] [address]             [phone]                 49 Days Post-Op Procedure(s) (LRB): ESOPHAGOGASTRODUODENOSCOPY (EGD) WITH PROPOFOL (N/A) ESOPHAGEAL STENT PLACEMENT (N/A) STENT REMOVAL   Events: No events Off pressors today  _______________________________________________________________ Vitals: BP (!) 140/80   Pulse 100   Temp 98 F (36.7 C) (Axillary)   Resp (!) 27   Ht '5\' 10"'$  (1.778 m)   Wt 83.4 kg   SpO2 93%   BMI 26.38 kg/m  Filed Weights   11/13/22 0500 11/15/22 0400 11/16/22 0242  Weight: 83.7 kg 85.3 kg 83.4 kg     - Neuro: sedated  - Cardiovascular: sinus ta  Drips: none    - Pulm: minimal CT output Vent Mode: PRVC FiO2 (%):  [60 %] 60 % Set Rate:  [25 bmp] 25 bmp Vt Set:  [560 mL] 560 mL PEEP:  [8 cmH20-10 cmH20] 10 cmH20 Plateau Pressure:  [29 cmH20-37 cmH20] 29 cmH20  ABG    Component Value Date/Time   PHART 7.321 (L) 11/13/2022 1113   PCO2ART 78.6 (HH) 11/13/2022 1113   PO2ART 56 (L) 11/13/2022 1113   HCO3 40.6 (H) 11/13/2022 1113   TCO2 43 (H) 11/13/2022 1113   ACIDBASEDEF 3.0 (H) 11/08/2022 0238   O2SAT 84 11/13/2022 1113    - Abd: ND - Extremity: warm  .Intake/Output      01/30 0701 01/31 0700 01/31 0701 02/01 0700   I.V. (mL/kg) 2416.5 (29)    Blood 467.5    Other 50    NG/GT     IV Piggyback 408    Total Intake(mL/kg) 3342 (40.1)    Urine (mL/kg/hr) 5500 (2.7)    Drains 30    Stool 35    Total Output 5565    Net -2223         Urine Occurrence 2 x       _______________________________________________________________ Labs:    Latest Ref Rng & Units 11/16/2022    4:18 AM 11/15/2022    3:40 AM 11/13/2022   11:13 AM  CBC  WBC 4.0 - 10.5 K/uL 14.4  11.9    Hemoglobin 13.0 - 17.0 g/dL 8.0  6.4  9.2   Hematocrit 39.0 - 52.0 % 27.2  21.6  27.0   Platelets 150 - 400 K/uL 78  70        Latest Ref Rng & Units 11/16/2022    4:18 AM 11/15/2022    3:40 AM 11/14/2022     5:40 AM  CMP  Glucose 70 - 99 mg/dL 129  100  131   BUN 8 - 23 mg/dL 44  40  46   Creatinine 0.61 - 1.24 mg/dL 0.64  0.59  0.64   Sodium 135 - 145 mmol/L 143  148  147   Potassium 3.5 - 5.1 mmol/L 4.6  4.1  4.8   Chloride 98 - 111 mmol/L 85  96  102   CO2 22 - 32 mmol/L >45  45  33   Calcium 8.9 - 10.3 mg/dL 10.1  10.1  9.5   Total Protein 6.5 - 8.1 g/dL 5.7  5.4  5.0   Total Bilirubin 0.3 - 1.2 mg/dL 0.9  0.7  0.6   Alkaline Phos 38 - 126 U/L 89  80  93   AST 15 -  41 U/L 40  38  49   ALT 0 - 44 U/L 37  45  70     CXR: -  _______________________________________________________________  Assessment and Plan: S/P Esophagectomy, with anastomotic leak  Neuro: on sedation CV: off pressor today Pulm: wean vent support as tolerated.  May need to consider trach   Renal: stable GI: NPO.  Making stool.  Will restart tube feed Heme: stable ID: afebrile.  On Merrem and mica.   Endo: SSI Dispo: continue ICU care   Lajuana Matte 11/16/2022 7:57 AM

## 2022-11-16 NOTE — Progress Notes (Signed)
[address]       [place] [address]             [phone]       EVENING ROUNDS  Off sedation and tolerating that Still on 60% fio2  and 10 peep  Stable

## 2022-11-17 ENCOUNTER — Inpatient Hospital Stay (HOSPITAL_COMMUNITY): Payer: No Typology Code available for payment source

## 2022-11-17 ENCOUNTER — Inpatient Hospital Stay: Payer: No Typology Code available for payment source | Admitting: Internal Medicine

## 2022-11-17 DIAGNOSIS — J869 Pyothorax without fistula: Secondary | ICD-10-CM | POA: Diagnosis not present

## 2022-11-17 LAB — COMPREHENSIVE METABOLIC PANEL
ALT: 32 U/L (ref 0–44)
AST: 38 U/L (ref 15–41)
Albumin: 2.4 g/dL — ABNORMAL LOW (ref 3.5–5.0)
Alkaline Phosphatase: 91 U/L (ref 38–126)
Anion gap: 9 (ref 5–15)
BUN: 55 mg/dL — ABNORMAL HIGH (ref 8–23)
CO2: 44 mmol/L — ABNORMAL HIGH (ref 22–32)
Calcium: 10.1 mg/dL (ref 8.9–10.3)
Chloride: 84 mmol/L — ABNORMAL LOW (ref 98–111)
Creatinine, Ser: 0.64 mg/dL (ref 0.61–1.24)
GFR, Estimated: >60 mL/min (ref >60)
Glucose, Bld: 140 mg/dL — ABNORMAL HIGH (ref 70–99)
Potassium: 4 mmol/L (ref 3.5–5.1)
Sodium: 137 mmol/L (ref 135–145)
Total Bilirubin: 0.9 mg/dL (ref 0.3–1.2)
Total Protein: 6.4 g/dL — ABNORMAL LOW (ref 6.5–8.1)

## 2022-11-17 LAB — CBC
HCT: 29.3 % — ABNORMAL LOW (ref 39.0–52.0)
Hemoglobin: 8.8 g/dL — ABNORMAL LOW (ref 13.0–17.0)
MCH: 30.8 pg (ref 26.0–34.0)
MCHC: 30 g/dL (ref 30.0–36.0)
MCV: 102.4 fL — ABNORMAL HIGH (ref 80.0–100.0)
Platelets: 93 10*3/uL — ABNORMAL LOW (ref 150–400)
RBC: 2.86 MIL/uL — ABNORMAL LOW (ref 4.22–5.81)
RDW: 19.1 % — ABNORMAL HIGH (ref 11.5–15.5)
WBC: 16 10*3/uL — ABNORMAL HIGH (ref 4.0–10.5)
nRBC: 0 % (ref 0.0–0.2)

## 2022-11-17 LAB — GLUCOSE, CAPILLARY
Glucose-Capillary: 144 mg/dL — ABNORMAL HIGH (ref 70–99)
Glucose-Capillary: 146 mg/dL — ABNORMAL HIGH (ref 70–99)
Glucose-Capillary: 101 mg/dL — ABNORMAL HIGH (ref 70–99)
Glucose-Capillary: 161 mg/dL — ABNORMAL HIGH (ref 70–99)
Glucose-Capillary: 164 mg/dL — ABNORMAL HIGH (ref 70–99)

## 2022-11-17 LAB — PHOSPHORUS: Phosphorus: 4.2 mg/dL (ref 2.5–4.6)

## 2022-11-17 LAB — MAGNESIUM: Magnesium: 2.3 mg/dL (ref 1.7–2.4)

## 2022-11-17 LAB — PROCALCITONIN: Procalcitonin: 1.49 ng/mL

## 2022-11-17 MED ORDER — OXYCODONE HCL 5 MG PO TABS
5.0000 mg | ORAL_TABLET | Freq: Two times a day (BID) | ORAL | Status: DC
  Administered 2022-11-17 – 2022-11-19 (×5): 5 mg
  Filled 2022-11-17 (×5): qty 1

## 2022-11-17 MED ORDER — TRACE MINERALS CU-MN-SE-ZN 300-55-60-3000 MCG/ML IV SOLN
INTRAVENOUS | Status: AC
  Filled 2022-11-17: qty 921.6

## 2022-11-17 MED ORDER — CLONAZEPAM 0.5 MG PO TABS
0.5000 mg | ORAL_TABLET | Freq: Two times a day (BID) | ORAL | Status: DC
  Administered 2022-11-17 – 2022-11-19 (×4): 0.5 mg
  Filled 2022-11-17 (×4): qty 1

## 2022-11-17 MED ORDER — ACETAZOLAMIDE 250 MG PO TABS
500.0000 mg | ORAL_TABLET | Freq: Two times a day (BID) | ORAL | Status: AC
  Administered 2022-11-17 (×2): 500 mg
  Filled 2022-11-17 (×2): qty 2

## 2022-11-17 NOTE — Progress Notes (Signed)
PHARMACY - TOTAL PARENTERAL NUTRITION CONSULT NOTE  Indication: Prolonged ileus  Patient Measurements: Height: '5\' 10"'$  (177.8 cm) Weight: 82.6 kg (182 lb 1.6 oz) IBW/kg (Calculated) : 73 TPN AdjBW (KG): 80 Body mass index is 26.13 kg/m. Usual Weight: 180-190 lbs before admission. Lowest at 170 lb this admit, now likely up due to volume   Assessment:  62 yo M with esophageal cancer and previous robotic assisted Ivor Lewis esophagectomy complicated by anastomotic leak admitted 10/07/2022 from clinic with fevers and purulent drainage from chest tubes. Patient is now s/p esophageal stent with persistent leak. Patient was on goal or near goal tube feeds 09/18/2022 - 11/08/22. On 1/24, TF rate was decreased to trickle due to increased pressor requirement. TF rate titrated up slowly and on 1/26, TF was increased to 50 ml/hr but then started leaking out around patient's PEG tube and from JP drains. Last documented bowel movement was 1/16 (had liquid smear on 1/26), high concern for ileus confirmed on imaging. Pharmacy consulted for TPN.   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 1/26 KUB - Mild prominence of gas in colon, most of small bowel is  gasless.  1/27 CT-  Large-volume pneumoperitoneum in upper abdomen, increased body wall edema and mesenteric edema, possible ileus in colon, moderate retained stool.  GI Surgeries / Procedures:  12/4  Esophagogastroscopy, Repositioned stent , R VATS 12/13 EGD with stent removal    Central access: PICC placed 11/06/22 TPN start date: 11/11/22   Nutritional Goals:  RD Estimated Needs Total Energy Estimated Needs: 267342-8768cals Total Protein Estimated Needs: 135-155 g Total Fluid Estimated Needs: >/= 2 L  Current Nutrition:  TPN  Vital AF 1.2 at 20 ml/hr - trickle feeds (no up titration) started 1/31  Plan:  Continue concentrated TPN at goal rate of 80 mL/hr to provide 138g AA, 374g CHO and 2614 kCal, meeting 100% of needs Electrolytes in TPN: Na 92m3mL, K 492m22m, Ca 92mEq74m Mag 5mEq/292mPhos 13mmol22m max CL Add standard MVI and trace elements to TPN Reduce SSI to sensitive Q8H and insulin in TPN to 50 units  Monitor TPN labs on Mon/Thurs  Monitor free water  F/U with ability to trial EN  Cathy PAlanda SlimD, FCCM [facility] Pharmacist Please see AMION for all Pharmacists' Contact Phone Numbers 11/17/2022, 7:06 AM

## 2022-11-17 NOTE — Progress Notes (Signed)
Brief Nutrition Note:   Pt remains on vent support, TPN continues at goal rate.  Trickle TF of Vital 1.5 infusing at 20 ml/hr via J-tube. No issues currently per report.   Noted plan for CT head and chest with plan for [place] discussion tomorrow AM.    Recommend continuing current TPN and TF for now  Kerman Passey MS, RDN, LDN, CNSC Registered Dietitian 3 Clinical Nutrition RD Pager and On-Call Pager Number Located in [place]

## 2022-11-17 NOTE — Progress Notes (Signed)
NAME:  [name] [name], MRN:  [id number], DOB:  1961-02-06, LOS: 66 ADMISSION DATE:  10/06/2022, CONSULTATION DATE: 11/06/2022 REFERRING MD: Dr. 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  Overnight patient became short of breath, started spiking fever so he was transferred to ICU.  This morning patient was started spiking fever to 103, remained tachycardic and tachypneic, PCCM was called for help evaluation medical management  Pertinent  Medical History   Past Medical History:  Diagnosis Date   Cancer [facility])    Esophageal Cancer   History of radiation therapy    Esophagus- 05/12/22-06/22/22- Dr. Gery Pray   Port-A-Cath in place 05/11/2022   Significant Hospital Events: Including procedures, antibiotic start and stop dates in addition to other pertinent events   12/1 admitted for drainage from empyema 12/13 EGD with stent placement. 1/21 intubated for aspiration 1/26 started on TPN, still not stooling.  Interim History / Subjective:  Continues to be encephalopathic.  O2 needs steady.  Diuresing robustly.  Objective   Blood pressure 107/70, pulse (!) 102, temperature 98.2 F (36.8 C), temperature source Axillary, resp. rate (!) 26, height '5\' 10"'$  (1.778 m), weight 82.6 kg, SpO2 97 %.    Vent Mode: PRVC FiO2 (%):  [60 %] 60 % Set Rate:  [22 bmp] 22 bmp Vt Set:  [560 mL-580 mL] 580 mL PEEP:  [10 cmH20] 10 cmH20 Plateau Pressure:  [28 cmH20-33 cmH20] 28 cmH20   Intake/Output Summary (Last 24 hours) at 11/17/2022 0800 Last data filed at 11/17/2022 0700 Gross per 24 hour  Intake 3065.08 ml  Output 7070 ml  Net -4004.92 ml    Filed Weights   11/15/22 0400 11/16/22 0242 11/17/22 0400  Weight: 85.3 kg 83.4 kg 82.6  kg    Examination: Only on precedex Not responding to pain for me Brainstem reflexes intact Lungs harsh rhonci Stable to slightly worsened subQ emphysema on L chest  Plts better WBC slightly up No new chest imaging  Assessment & Plan:  Acute hypoxic and hypercapnic respiratory failure due E coli Aspiration pneumonia-LUL, RLL with ARDS; working on diuresis but really having trouble weaning O2 due to severity of lung disease.  Pct improving with current abx regimen. Septic shock due to bilateral multifocal pneumonia; improved Right-sided empyema s/p VATS, currently with chest tube placed and ongoing output from anastomotic leak Acute septic encephalopathy- lingering even off sedation Moderate protein energy malnutrition Constipation- improved Afib new onset- resolved, hold on full AC for now Anemia of chronic disease- worsened during stay, got unit 1/30 with good response.  No s/s of bleeding. Adenocarcinoma esophagus s/p esophagectomy, adjuvant chemoradiation Anastomotic leak s/p esophageal stent: ongoing issue; eventual potential revision at [facility] if can get recovered Hyperglycemia due to acute illness, steroids. A1c is 4.3  - taper off klonipin and oxycodone given coma - Lung protective tidal volumes limiting driving pressures to < 15cm H2O as able - Sedation titrated to vent compliance and patient comfort using PAD orderset - VAP prevention bundle - Daily SAT/SBT when meets institutional criteria: O2/PEEP needs still too high and lungs too stiff, mental status not great either - Meropenem and micafungin until Feb 9 per ID - Diamox + lasix again today - Continue midodrine  CT head and CT chest today.  Coker talk tomorrow AM 8AM  as patient may not have wanted this level of support for so long.  DNR  Best Practice (right click and "Reselect all SmartList Selections" daily)   Diet/type: TF challenge, TPN DVT prophylaxis: prophylactic heparin  GI prophylaxis: PPI Lines: Central  line and yes and it is still needed-picc Foley:  N/A Code Status:  DNR Last date of multidisciplinary goals of care discussion [tomorrow AM]  35 min cc time Erskine Emery MD PCCM

## 2022-11-17 NOTE — Progress Notes (Signed)
[address]       [place] [address]             [phone]                 50 Days Post-Op Procedure(s) (LRB): ESOPHAGOGASTRODUODENOSCOPY (EGD) WITH PROPOFOL (N/A) ESOPHAGEAL STENT PLACEMENT (N/A) STENT REMOVAL   Events: No events   _______________________________________________________________ Vitals: BP 107/70   Pulse 86   Temp 98.2 F (36.8 C) (Axillary)   Resp (!) 30   Ht '5\' 10"'$  (1.778 m)   Wt 82.6 kg   SpO2 95%   BMI 26.13 kg/m  Filed Weights   11/15/22 0400 11/16/22 0242 11/17/22 0400  Weight: 85.3 kg 83.4 kg 82.6 kg     - Neuro: sedated  - Cardiovascular: sinus ta  Drips: none    - Pulm: minimal CT output Vent Mode: PRVC FiO2 (%):  [60 %] 60 % Set Rate:  [22 bmp-25 bmp] 22 bmp Vt Set:  [560 mL-580 mL] 580 mL PEEP:  [10 cmH20] 10 cmH20 Plateau Pressure:  [28 cmH20-33 cmH20] 28 cmH20  ABG    Component Value Date/Time   PHART 7.387 11/16/2022 1113   PCO2ART 93.5 (HH) 11/16/2022 1113   PO2ART 103 11/16/2022 1113   HCO3 56.2 (H) 11/16/2022 1113   TCO2 >50 (H) 11/16/2022 1113   ACIDBASEDEF 3.0 (H) 11/08/2022 0238   O2SAT 97 11/16/2022 1113    - Abd: ND - Extremity: warm  .Intake/Output      01/31 0701 02/01 0700 02/01 0701 02/02 0700   I.V. (mL/kg) 2226.3 (27)    Blood     Other 0    NG/GT 430.7    IV Piggyback 408.1    Total Intake(mL/kg) 3065.1 (37.1)    Urine (mL/kg/hr) 6075 (3.1)    Drains 60    Stool 975    Total Output 7110    Net -4044.9            _______________________________________________________________ Labs:    Latest Ref Rng & Units 11/17/2022    3:59 AM 11/16/2022   11:13 AM 11/16/2022    4:18 AM  CBC  WBC 4.0 - 10.5 K/uL 16.0   14.4   Hemoglobin 13.0 - 17.0 g/dL 8.8  9.5  8.0   Hematocrit 39.0 - 52.0 % 29.3  28.0  27.2   Platelets 150 - 400 K/uL 93   78       Latest Ref Rng & Units 11/17/2022    3:59 AM 11/16/2022   11:13 AM 11/16/2022    4:18 AM  CMP  Glucose 70 - 99 mg/dL  140   129   BUN 8 - 23 mg/dL 55   44   Creatinine 0.61 - 1.24 mg/dL 0.64   0.64   Sodium 135 - 145 mmol/L 137  137  143   Potassium 3.5 - 5.1 mmol/L 4.0  4.4  4.6   Chloride 98 - 111 mmol/L 84   85   CO2 22 - 32 mmol/L 44   >45   Calcium 8.9 - 10.3 mg/dL 10.1   10.1   Total Protein 6.5 - 8.1 g/dL 6.4   5.7   Total Bilirubin 0.3 - 1.2 mg/dL 0.9   0.9   Alkaline Phos 38 - 126 U/L 91   89   AST 15 - 41 U/L 38   40   ALT 0 - 44 U/L 32   37  CXR: -  _______________________________________________________________  Assessment and Plan: S/P Esophagectomy, with anastomotic leak  Neuro: on sedation CV: off pressor today Pulm: wean vent support as tolerated.  May need to consider trach   Renal: stable GI: NPO.  Making stool.  On tube feed Heme: stable ID: afebrile.  On Merrem and mica.   Endo: SSI Dispo: continue ICU care.  Will discuss goals of care with family tomorrow   Lajuana Matte 11/17/2022 7:42 AM

## 2022-11-18 DIAGNOSIS — G9340 Encephalopathy, unspecified: Secondary | ICD-10-CM | POA: Diagnosis not present

## 2022-11-18 LAB — COMPREHENSIVE METABOLIC PANEL
ALT: 29 U/L (ref 0–44)
AST: 38 U/L (ref 15–41)
Albumin: 2.2 g/dL — ABNORMAL LOW (ref 3.5–5.0)
Alkaline Phosphatase: 87 U/L (ref 38–126)
Anion gap: 14 (ref 5–15)
BUN: 65 mg/dL — ABNORMAL HIGH (ref 8–23)
CO2: 35 mmol/L — ABNORMAL HIGH (ref 22–32)
Calcium: 10 mg/dL (ref 8.9–10.3)
Chloride: 86 mmol/L — ABNORMAL LOW (ref 98–111)
Creatinine, Ser: 0.66 mg/dL (ref 0.61–1.24)
GFR, Estimated: >60 mL/min (ref >60)
Glucose, Bld: 142 mg/dL — ABNORMAL HIGH (ref 70–99)
Potassium: 3.1 mmol/L — ABNORMAL LOW (ref 3.5–5.1)
Sodium: 135 mmol/L (ref 135–145)
Total Bilirubin: 0.7 mg/dL (ref 0.3–1.2)
Total Protein: 6.2 g/dL — ABNORMAL LOW (ref 6.5–8.1)

## 2022-11-18 LAB — CBC
HCT: 28.4 % — ABNORMAL LOW (ref 39.0–52.0)
Hemoglobin: 8.9 g/dL — ABNORMAL LOW (ref 13.0–17.0)
MCH: 31.2 pg (ref 26.0–34.0)
MCHC: 31.3 g/dL (ref 30.0–36.0)
MCV: 99.6 fL (ref 80.0–100.0)
Platelets: 113 10*3/uL — ABNORMAL LOW (ref 150–400)
RBC: 2.85 MIL/uL — ABNORMAL LOW (ref 4.22–5.81)
RDW: 19 % — ABNORMAL HIGH (ref 11.5–15.5)
WBC: 15.2 10*3/uL — ABNORMAL HIGH (ref 4.0–10.5)
nRBC: 0.1 % (ref 0.0–0.2)

## 2022-11-18 LAB — MAGNESIUM: Magnesium: 2.4 mg/dL (ref 1.7–2.4)

## 2022-11-18 LAB — GLUCOSE, CAPILLARY
Glucose-Capillary: 140 mg/dL — ABNORMAL HIGH (ref 70–99)
Glucose-Capillary: 141 mg/dL — ABNORMAL HIGH (ref 70–99)
Glucose-Capillary: 150 mg/dL — ABNORMAL HIGH (ref 70–99)
Glucose-Capillary: 160 mg/dL — ABNORMAL HIGH (ref 70–99)
Glucose-Capillary: 173 mg/dL — ABNORMAL HIGH (ref 70–99)

## 2022-11-18 LAB — PHOSPHORUS: Phosphorus: 3.2 mg/dL (ref 2.5–4.6)

## 2022-11-18 MED ORDER — FENTANYL CITRATE PF 50 MCG/ML IJ SOSY
50.0000 ug | PREFILLED_SYRINGE | Freq: Once | INTRAMUSCULAR | Status: AC
  Administered 2022-11-18: 50 ug via INTRAVENOUS
  Filled 2022-11-18: qty 1

## 2022-11-18 MED ORDER — TRACE MINERALS CU-MN-SE-ZN 300-55-60-3000 MCG/ML IV SOLN
INTRAVENOUS | Status: DC
  Filled 2022-11-18: qty 921.6

## 2022-11-18 MED ORDER — FLUCONAZOLE IN SODIUM CHLORIDE 400-0.9 MG/200ML-% IV SOLN
800.0000 mg | INTRAVENOUS | Status: DC
  Administered 2022-11-18: 400 mg via INTRAVENOUS
  Administered 2022-11-19: 800 mg via INTRAVENOUS
  Filled 2022-11-18 (×2): qty 400

## 2022-11-18 MED ORDER — PIPERACILLIN-TAZOBACTAM 3.375 G IVPB
3.3750 g | Freq: Three times a day (TID) | INTRAVENOUS | Status: DC
  Administered 2022-11-18 – 2022-11-19 (×2): 3.375 g via INTRAVENOUS
  Filled 2022-11-18 (×2): qty 50

## 2022-11-18 MED ORDER — FENTANYL 2500MCG IN NS 250ML (10MCG/ML) PREMIX INFUSION
50.0000 ug/h | INTRAVENOUS | Status: DC
  Administered 2022-11-18: 50 ug/h via INTRAVENOUS
  Administered 2022-11-19: 150 ug/h via INTRAVENOUS
  Filled 2022-11-18 (×2): qty 250

## 2022-11-18 MED ORDER — FENTANYL BOLUS VIA INFUSION: 50.0000 ug | INTRAVENOUS | Status: DC | PRN

## 2022-11-18 MED ORDER — PIPERACILLIN-TAZOBACTAM 3.375 G IVPB
3.3750 g | Freq: Three times a day (TID) | INTRAVENOUS | Status: DC
  Administered 2022-11-18: 3.375 g via INTRAVENOUS
  Filled 2022-11-18 (×2): qty 50

## 2022-11-18 MED ORDER — POTASSIUM CHLORIDE 20 MEQ PO PACK
40.0000 meq | PACK | ORAL | Status: AC
  Administered 2022-11-18 (×2): 40 meq
  Filled 2022-11-18 (×2): qty 2

## 2022-11-18 NOTE — Progress Notes (Signed)
[address]       [place] [address]             [phone]                 51 Days Post-Op Procedure(s) (LRB): ESOPHAGOGASTRODUODENOSCOPY (EGD) WITH PROPOFOL (N/A) ESOPHAGEAL STENT PLACEMENT (N/A) STENT REMOVAL   Events: No events   _______________________________________________________________ Vitals: BP 118/70   Pulse 79   Temp 98.5 F (36.9 C) (Oral)   Resp (!) 35   Ht '5\' 10"'$  (1.778 m)   Wt 83 kg   SpO2 91%   BMI 26.26 kg/m  Filed Weights   11/16/22 0242 11/17/22 0400 11/18/22 0500  Weight: 83.4 kg 82.6 kg 83 kg     - Neuro: sedated  - Cardiovascular: sinus ta  Drips: none    - Pulm: minimal CT output Vent Mode: PRVC FiO2 (%):  [40 %] 40 % Set Rate:  [22 bmp] 22 bmp Vt Set:  [560 mL] 560 mL PEEP:  [8 cmH20] 8 cmH20 Plateau Pressure:  [23 cmH20-31 cmH20] 23 cmH20  ABG    Component Value Date/Time   PHART 7.387 11/16/2022 1113   PCO2ART 93.5 (HH) 11/16/2022 1113   PO2ART 103 11/16/2022 1113   HCO3 56.2 (H) 11/16/2022 1113   TCO2 >50 (H) 11/16/2022 1113   ACIDBASEDEF 3.0 (H) 11/08/2022 0238   O2SAT 97 11/16/2022 1113    - Abd: ND - Extremity: warm  .Intake/Output      02/01 0701 02/02 0700 02/02 0701 11/29/2022 0700   I.V. (mL/kg) 1836.6 (22.1) 393.3 (4.7)   Other     NG/GT 420 80   IV Piggyback 408    Total Intake(mL/kg) 2664.6 (32.1) 473.3 (5.7)   Urine (mL/kg/hr) 4125 (2.1) 400 (2.2)   Drains 35 0   Stool 225 35   Total Output 4385 435   Net -1720.4 +38.3           _______________________________________________________________ Labs:    Latest Ref Rng & Units 11/18/2022    6:30 AM 11/17/2022    3:59 AM 11/16/2022   11:13 AM  CBC  WBC 4.0 - 10.5 K/uL 15.2  16.0    Hemoglobin 13.0 - 17.0 g/dL 8.9  8.8  9.5   Hematocrit 39.0 - 52.0 % 28.4  29.3  28.0   Platelets 150 - 400 K/uL 113  93        Latest Ref Rng & Units 11/18/2022    6:19 AM 11/17/2022    3:59 AM 11/16/2022   11:13 AM  CMP  Glucose 70 - 99  mg/dL 142  140    BUN 8 - 23 mg/dL 65  55    Creatinine 0.61 - 1.24 mg/dL 0.66  0.64    Sodium 135 - 145 mmol/L 135  137  137   Potassium 3.5 - 5.1 mmol/L 3.1  4.0  4.4   Chloride 98 - 111 mmol/L 86  84    CO2 22 - 32 mmol/L 35  44    Calcium 8.9 - 10.3 mg/dL 10.0  10.1    Total Protein 6.5 - 8.1 g/dL 6.2  6.4    Total Bilirubin 0.3 - 1.2 mg/dL 0.7  0.9    Alkaline Phos 38 - 126 U/L 87  91    AST 15 - 41 U/L 38  38    ALT 0 - 44 U/L 29  32      CXR: -  _______________________________________________________________  Assessment and Plan: S/P Esophagectomy, with anastomotic leak  Neuro: on sedation CV: off pressor today Pulm: on low vent settings.   Renal: stable GI: NPO.  Making stool.  On tube feed Heme: stable ID: afebrile.   Endo: SSI Dispo: continue ICU care.  Discussed goals with wife.  I did recommend performing a trach, however he still has a very poor prognosis given his deconditioning.  She stated that she did not want to pursuit any further measures.  She will speak with her children, but is leaning towards transitioning to comfort care.  Lajuana Matte 11/18/2022 9:10 AM

## 2022-11-18 NOTE — Progress Notes (Signed)
Nutrition Brief Note  Chart reviewed. After GOC discussion, plan for transition to comfort care once pt's children able to visit.  No further nutrition interventions planned at this time. TPN continues at 80 ml/hr, Trickle TF at 20 via J-tube. OK to discontinue this from RD perspective but if MD and/or family desire to continue for now, that is ok too.  Please re-consult as needed.   Kerman Passey MS, RDN, LDN, CNSC Registered Dietitian 3 Clinical Nutrition RD Pager and On-Call Pager Number Located in [place]

## 2022-11-18 NOTE — Progress Notes (Signed)
PHARMACY - TOTAL PARENTERAL NUTRITION CONSULT NOTE  Indication: Prolonged ileus  Patient Measurements: Height: '5\' 10"'$  (177.8 cm) Weight: 83 kg (182 lb 15.7 oz) IBW/kg (Calculated) : 73 TPN AdjBW (KG): 80 Body mass index is 26.26 kg/m. Usual Weight: 180-190 lbs before admission. Lowest at 170 lb this admit, now likely up due to volume   Assessment:  62 yo M with esophageal cancer and previous robotic assisted Ivor Lewis esophagectomy complicated by anastomotic leak admitted 09/25/2022 from clinic with fevers and purulent drainage from chest tubes. Patient is now s/p esophageal stent with persistent leak. Patient was on goal or near goal tube feeds 10/15/2022 - 11/08/22. On 1/24, TF rate was decreased to trickle due to increased pressor requirement. TF rate titrated up slowly and on 1/26, TF was increased to 50 ml/hr but then started leaking out around patient's PEG tube and from JP drains. Last documented bowel movement was 1/16 (had liquid smear on 1/26), high concern for ileus confirmed on imaging. Pharmacy consulted for TPN.   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 1/26 KUB - Mild prominence of gas in colon, most  of small bowel is gasless.  1/27 CT-  Large-volume pneumoperitoneum in upper abdomen, increased body wall edema and mesenteric edema, possible ileus in colon, moderate retained stool.  GI Surgeries / Procedures:  12/4  Esophagogastroscopy, Repositioned stent , R VATS 12/13 EGD with stent removal    Central access: PICC placed 11/06/22 TPN start date: 11/11/22   Nutritional Goals:  RD Estimated Needs Total Energy Estimated Needs: 2600-2900 kcals Total Protein Estimated Needs: 135-155 g Total Fluid Estimated Needs: >/= 2 L  Current Nutrition:  TPN  Vital AF 1.2 at 20 ml/hr - trickle feeds (no up titration) started 1/31  Plan:  Continue concentrated TPN at goal rate of 80 mL/hr to provide 138g AA, 374g CHO and 2614 kCal, meeting 100% of needs Electrolytes in TPN: Na 69m33mL, K 469m19m, Ca 69mEq69m reduce Mag slightly to 4mEq/43mPhos 13mmol74m max CL Add standard MVI and trace elements to TPN Continue sensitive SSI Q8H and insulin 50 units in TPN KCL 469mEq P64m2 doses to account for Lasix Monitor TPN labs on Mon/Thurs  Monitor Na and might need to add to TPN F/U TF tolerance/advancement to begin weaning TPN F/U with GoC discussion  [name] [name] D. Ennifer Harston, PhMina [name], BCPS, BCCCP 2/[place]4, 10:07 AM

## 2022-11-18 NOTE — Progress Notes (Signed)
     [address]       [place] [address]             [phone]       EVENING ROUNDS  Discussed with CCM, plans on comfort care when children arrive Continue support

## 2022-11-18 NOTE — Progress Notes (Addendum)
NAME:  [name] [name], MRN:  [id number], DOB:  May 10, 1961, LOS: 23 ADMISSION DATE:  10/12/2022, CONSULTATION DATE: 11/06/2022 REFERRING MD: Dr. 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  Overnight patient became short of breath, started spiking fever so he was transferred to ICU.  This morning patient was started spiking fever to 103, remained tachycardic and tachypneic, PCCM was called for help evaluation medical management  Pertinent  Medical History   Past Medical History:  Diagnosis Date   Cancer [facility])    Esophageal Cancer   History of radiation therapy    Esophagus- 05/12/22-06/22/22- Dr. Gery Pray   Port-A-Cath in place 05/11/2022   Significant Hospital Events: Including procedures, antibiotic start and stop dates in addition to other pertinent events   12/1 admitted for drainage from empyema 12/13 EGD with stent placement. 1/21 intubated for aspiration 1/26 started on TPN, still not stooling.  Interim History / Subjective:  Remains on vent, O2 needs a bit better. Still not following commands.  Objective   Blood pressure 118/70, pulse 79, temperature 98.5 F (36.9 C), temperature source Oral, resp. rate (!) 35, height '5\' 10"'$  (1.778 m), weight 83 kg, SpO2 91 %.    Vent Mode: PRVC FiO2 (%):  [40 %] 40 % Set Rate:  [22 bmp] 22 bmp Vt Set:  [560 mL] 560 mL PEEP:  [8 cmH20] 8 cmH20 Plateau Pressure:  [23 cmH20-31 cmH20] 23 cmH20   Intake/Output Summary (Last 24 hours) at 11/18/2022 6948 Last data filed at 11/18/2022 0800 Gross per 24 hour  Intake 3137.85 ml  Output 4785 ml  Net -1647.15 ml    Filed Weights   11/16/22 0242 11/17/22 0400 11/18/22 0500  Weight: 83.4 kg 82.6 kg 83 kg     Examination: Similar to yesterday: encephalopathic, winces to pain, brainstem reflexes intact Harsh rhonci and subQ air Drain output stable  CBC stable BMP okay, K to be addressed by TPN team  Assessment & Plan:  Acute hypoxic and hypercapnic respiratory failure due E coli Aspiration pneumonia-LUL, RLL with ARDS; working on diuresis but really having trouble weaning O2 due to severity of lung disease.  Pct improving with current abx regimen. Septic shock due to bilateral multifocal pneumonia; improved/resolved Right-sided empyema s/p VATS, currently with chest tube placed and ongoing output from anastomotic leak Acute septic encephalopathy- lingering even off sedation, air hungry Moderate protein energy malnutrition- POA, on TPN Constipation- improved Afib new onset- resolved, hold on full AC for now Anemia of chronic disease- worsened during stay, got unit 1/30 with good response.  No s/s of bleeding. Adenocarcinoma esophagus s/p esophagectomy, adjuvant chemoradiation Anastomotic leak s/p esophageal stent: ongoing issue; eventual potential revision at [facility] if can get recovered Hyperglycemia due to acute illness, steroids. A1c is 4.3 CT head abnormality- chasing this will not address overall prognosis  See IPAL today Fluconazole/zosyn through 2/9 Fent gtt started for air hunger Continue drains for now Continue vent support No escalation of care Family being called in; DNR, transition to comfort when everyone arrives and is ready  Best Practice (right click and "Reselect all SmartList Selections" daily)   Diet/type: TF, TPN DVT prophylaxis: prophylactic heparin  GI prophylaxis: PPI Lines: Central line and yes and it is still needed-picc Foley:  N/A Code Status:  DNR Last date  of multidisciplinary goals of care discussion [IPAL 11/18/22]  48 min cc time Erskine Emery MD PCCM

## 2022-11-18 NOTE — IPAL (Addendum)
  Interdisciplinary Goals of Care Family Meeting   Date carried out: 11/18/2022  Location of the meeting: Conference room  Member's involved:  [name] [name] Wife  Durable Power of Attorney or Loss adjuster, chartered: Wife    Discussion: We discussed goals of care for [name] .    Discussed his fierce independence, recent suffering and if he would want to push forward with the ever diminishing goal of functional recovery.  This would include trach, rehab, and eventual [facility] transfer should nutritional and strength status ever improve.  Given what wife has seen with patient and discussed previously, she thinks he has had enough suffering and would rather pass peacefully.    Code status:   Code Status: DNR   Disposition:  No escalation in care Wife to call children in to see him next week then transition to full comfort  Time spent for the meeting: 25 minutes    Candee Furbish, MD  11/18/2022, 8:31 AM

## 2022-11-19 DIAGNOSIS — A419 Sepsis, unspecified organism: Secondary | ICD-10-CM | POA: Diagnosis not present

## 2022-11-19 DIAGNOSIS — R6521 Severe sepsis with septic shock: Secondary | ICD-10-CM | POA: Diagnosis not present

## 2022-11-19 LAB — COMPREHENSIVE METABOLIC PANEL
ALT: 29 U/L (ref 0–44)
AST: 42 U/L — ABNORMAL HIGH (ref 15–41)
Albumin: 2.3 g/dL — ABNORMAL LOW (ref 3.5–5.0)
Alkaline Phosphatase: 89 U/L (ref 38–126)
Anion gap: 10 (ref 5–15)
BUN: 65 mg/dL — ABNORMAL HIGH (ref 8–23)
CO2: 35 mmol/L — ABNORMAL HIGH (ref 22–32)
Calcium: 10 mg/dL (ref 8.9–10.3)
Chloride: 93 mmol/L — ABNORMAL LOW (ref 98–111)
Creatinine, Ser: 0.63 mg/dL (ref 0.61–1.24)
GFR, Estimated: >60 mL/min (ref >60)
Glucose, Bld: 140 mg/dL — ABNORMAL HIGH (ref 70–99)
Potassium: 3.2 mmol/L — ABNORMAL LOW (ref 3.5–5.1)
Sodium: 138 mmol/L (ref 135–145)
Total Bilirubin: 0.8 mg/dL (ref 0.3–1.2)
Total Protein: 6.5 g/dL (ref 6.5–8.1)

## 2022-11-19 LAB — CBC
HCT: 30.3 % — ABNORMAL LOW (ref 39.0–52.0)
Hemoglobin: 9.2 g/dL — ABNORMAL LOW (ref 13.0–17.0)
MCH: 30.5 pg (ref 26.0–34.0)
MCHC: 30.4 g/dL (ref 30.0–36.0)
MCV: 100.3 fL — ABNORMAL HIGH (ref 80.0–100.0)
Platelets: 135 10*3/uL — ABNORMAL LOW (ref 150–400)
RBC: 3.02 MIL/uL — ABNORMAL LOW (ref 4.22–5.81)
RDW: 19.2 % — ABNORMAL HIGH (ref 11.5–15.5)
WBC: 11.9 10*3/uL — ABNORMAL HIGH (ref 4.0–10.5)
nRBC: 0 % (ref 0.0–0.2)

## 2022-11-19 LAB — PHOSPHORUS: Phosphorus: 2.6 mg/dL (ref 2.5–4.6)

## 2022-11-19 LAB — GLUCOSE, CAPILLARY
Glucose-Capillary: 141 mg/dL — ABNORMAL HIGH (ref 70–99)
Glucose-Capillary: 155 mg/dL — ABNORMAL HIGH (ref 70–99)
Glucose-Capillary: 162 mg/dL — ABNORMAL HIGH (ref 70–99)
Glucose-Capillary: 164 mg/dL — ABNORMAL HIGH (ref 70–99)
Glucose-Capillary: 165 mg/dL — ABNORMAL HIGH (ref 70–99)

## 2022-11-19 LAB — MAGNESIUM: Magnesium: 2.3 mg/dL (ref 1.7–2.4)

## 2022-11-19 MED ORDER — ACETAMINOPHEN 650 MG RE SUPP: 650.0000 mg | Freq: Four times a day (QID) | RECTAL | Status: DC | PRN

## 2022-11-19 MED ORDER — GLYCOPYRROLATE 0.2 MG/ML IJ SOLN
0.2000 mg | INTRAMUSCULAR | Status: DC | PRN
  Administered 2022-11-19: 0.2 mg via INTRAVENOUS

## 2022-11-19 MED ORDER — FENTANYL BOLUS VIA INFUSION: 100.0000 ug | INTRAVENOUS | Status: DC | PRN

## 2022-11-19 MED ORDER — GLYCOPYRROLATE 1 MG PO TABS: 1.0000 mg | ORAL_TABLET | ORAL | Status: DC | PRN

## 2022-11-19 MED ORDER — GLYCOPYRROLATE 0.2 MG/ML IJ SOLN: 0.2000 mg | INTRAMUSCULAR | Status: DC | PRN

## 2022-11-19 MED ORDER — ACETAMINOPHEN 325 MG PO TABS: 650.0000 mg | ORAL_TABLET | Freq: Four times a day (QID) | ORAL | Status: DC | PRN

## 2022-11-19 MED ORDER — POLYVINYL ALCOHOL 1.4 % OP SOLN: 1.0000 [drp] | Freq: Four times a day (QID) | OPHTHALMIC | Status: DC | PRN

## 2022-11-19 MED ORDER — GLYCOPYRROLATE 0.2 MG/ML IJ SOLN
INTRAMUSCULAR | Status: AC
  Filled 2022-11-19: qty 1

## 2022-11-19 MED ORDER — SODIUM CHLORIDE 0.9 % IV SOLN: INTRAVENOUS | Status: DC

## 2022-11-19 MED ORDER — FENTANYL 2500MCG IN NS 250ML (10MCG/ML) PREMIX INFUSION
0.0000 ug/h | INTRAVENOUS | Status: DC
  Administered 2022-11-19: 400 ug/h via INTRAVENOUS
  Filled 2022-11-19: qty 250

## 2022-12-16 NOTE — Progress Notes (Signed)
[address]       [employer] [address]             [phone]      52 Days Post-Op  Procedure(s) (LRB): ESOPHAGOGASTRODUODENOSCOPY (EGD) WITH PROPOFOL (N/A) ESOPHAGEAL STENT PLACEMENT (N/A) STENT REMOVAL  Total Length of Stay:  LOS: 64 days   SUBJECTIVE: Sedated but had some periods of being awake Family to come in today for comfort measures  Vitals:   11/24/22 0700 November 24, 2022 0715  BP: (!) 78/55 (!) 89/57  Pulse: 72 66  Resp: (!) 29 (!) 29  Temp:    SpO2: 98% 98%    Intake/Output      02/02 0701 24-Nov-2022 0700 11-24-22 0701 02/04 0700   I.V. (mL/kg) 2480.3 (31.4)    Other 175    NG/GT 480    IV Piggyback 539.7    Total Intake(mL/kg) 3675 (46.5)    Urine (mL/kg/hr) 3900 (2.1) 100 (2.1)   Drains 35    Stool 75    Total Output 4010 100   Net -335 -100            sodium chloride Stopped (11/18/22 0808)   dexmedetomidine (PRECEDEX) IV infusion 0.3 mcg/kg/hr (November 24, 2022 0527)   feeding supplement (VITAL AF 1.2 CAL) 20 mL/hr at 11-24-2022 0400   fentaNYL infusion INTRAVENOUS 150 mcg/hr (11-24-22 0400)   fluconazole (DIFLUCAN) IV 100 mL/hr at 11/18/22 1200   midazolam Stopped (11/16/22 0657)   norepinephrine (LEVOPHED) Adult infusion 5 mcg/min (11/24/2022 0400)   piperacillin-tazobactam (ZOSYN)  IV 3.375 g (Nov 24, 2022 0529)   TPN ADULT (ION) 80 mL/hr at 2022/11/24 0400    CBC    Component Value Date/Time   WBC 11.9 (H) 24-Nov-2022 0414   RBC 3.02 (L) 2022-11-24 0414   HGB 9.2 (L) 2022-11-24 0414   HGB 10.0 (L) 07/05/2022 0937   HCT 30.3 (L) November 24, 2022 0414   PLT 135 (L) 11/24/2022 0414   PLT 203 07/05/2022 0937   MCV 100.3 (H) 11/24/2022 0414   MCH 30.5 11/24/2022 0414   MCHC 30.4 24-Nov-2022 0414   RDW 19.2 (H) 11/24/22 0414   LYMPHSABS 0.2 (L) 11/03/2022 0400   MONOABS 1.1 (H) 11/03/2022 0400   EOSABS 0.0 11/03/2022 0400   BASOSABS 0.0 11/03/2022 0400   CMP     Component Value Date/Time   NA 138 2022/11/24 0414   K 3.2 (L) November 24, 2022  0414   CL 93 (L) 24-Nov-2022 0414   CO2 35 (H) November 24, 2022 0414   GLUCOSE 140 (H) 11/24/2022 0414   BUN 65 (H) 11-24-22 0414   CREATININE 0.63 Nov 24, 2022 0414   CREATININE 0.89 07/05/2022 0937   CALCIUM 10.0 Nov 24, 2022 0414   CALCIUM 10.8 (H) 11/10/2022 0906   PROT 6.5 24-Nov-2022 0414   ALBUMIN 2.3 (L) Nov 24, 2022 0414   AST 42 (H) 24-Nov-2022 0414   AST 14 (L) 07/05/2022 0937   ALT 29 24-Nov-2022 0414   ALT 9 07/05/2022 0937   ALKPHOS 89 Nov 24, 2022 0414   BILITOT 0.8 Nov 24, 2022 0414   BILITOT 0.8 07/05/2022 0937   GFRNONAA >60 11-24-22 0414   GFRNONAA >60 07/05/2022 0937   ABG    Component Value Date/Time   PHART 7.387 11/16/2022 1113   PCO2ART 93.5 (HH) 11/16/2022 1113   PO2ART 103 11/16/2022 1113   HCO3 56.2 (H) 11/16/2022 1113   TCO2 >50 (H) 11/16/2022 1113   ACIDBASEDEF 3.0 (H) 11/08/2022 0238   O2SAT 97 11/16/2022 1113   CBG (last 3)  Recent  Labs    11/18/22 1927 12-06-2022 0000 2022/12/06 0403  GLUCAP 155* 165* 141*     ASSESSMENT: Sepsis secondary to aspiration and esophageal leak Discussed with CCM and when family arrives will institute comfort measures   Coralie Common, MD 12/06/22

## 2022-12-16 NOTE — Death Summary Note (Signed)
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 male admitted from the clinic on 2022/09/27 with new onset fevers and purulent drainage from his chest tubes. He has some worsening shortness of breath, and generalized weakness. Patient has undergone an esophagoscopy, robotic assisted laparoscopy, robotic assisted thoracoscopy, Ivor Lewis  Esophagectomy, pyloromyotomy, lap J tube, and intercostal nerve block by Dr. Kipp [name] on 08/18/2022. He then underwent an esophagogastroscopy, repositioning of esophageal stent, and right VATS on 08/29/2025 for an anastomotic esophageal leak. He was admitted for further treatment regarding fever and failure to thrive.   Hospital Course: He was continued on Zosyn and Diflucan. Tube feedings were resumed and nutrition recommendations were followed accordingly. He underwent an esophagogastroscopy, repositioning of  esophageal stent, esophagram, and right thoracoscopy on 09/18/2022. H and H on 09/21/2022 was 7.5 and 24.2. He had dizziness and did not ambulate much. He was given a unit of blood. Right chest flushes were stopped on 12/06. Pre albumin was checked on 12/08 and was up to 15. Vancomycin was added to antibiotic regimen as right pleural fluid showed E. Coli. PT and OT consults were obtained to assist with ambulation as patient severely deconditioned. Patient developed a fever to 101.3 on 12/08. CXR showed increase in right hydropneumothorax. His chest tube was placed to 40 cm suction. He had persistent hyponatremia, despite stopping scheduled free water. He also developed superficial dehiscence (about the size of a marble) of the skin edges from right lateral chest wound. The patient developed leukocytosis with worsening CXR. GI was consulted on 12/11. Barium swallow study that was ordered showed right greater than left contrast leak within the upper mediastinum. GI recommended upper GI endoscopy and this was done on 12/13.  A portion of the previously placed esophageal stent was removed. Going distally, there was a 40 mm by 15 mm fistula found in the proximal esophagus and there was evidence of necrosis which was debrided. A 20 mm fistula was found just distal to the esophago-gastric anastomosis. GI and Dr. Kipp [name] had a discussion regarding next steps of management. It was decided to transfer to [facility] for possible endoscopic suturing. Lab results showed H and H to be increased to 9.9 and 30.7, WBC 9,100, and sodium 131. Pre albumin was increased to 18 on 12/19. Right chest wound dehisced further. Wound was packed with normal saline wet to dry. Purulent drainage eventually stopped and dehiscence resolved. The patient stabilized. The patient developed nausea with vomiting in the  evenings. Scheduled Zofran was ordered and this did not help. Adjustment was made to timing of supplements placed into his J Tube and this  relieved his symptoms. ID consult was obtained for IV antibiotics and they agreed with continuing IV Zosyn and Diflucan until 11/25/22. The patient's tube feeds were adjusted to run 18 hrs. The patient tolerated these without significant difficulty. The patient would require further treatment at [facility]. If hospital to hospital transfer could be arranged this was the preferred option. If not possible outpatient clinic follow up would be arranged. The patient was ready for discharge once transfer vs. outpatient appointment could be arranged. Dr. Donneta [name] office documented a new referral and records were sent to the [facility] service on 10/20/2022. Supportive care was continued with tube feeding and antibiotics. The right pleural drain was transitioned to a bulb collection system in favor of the Pleur-evac since it was draining less than 20 mL/day. [name] [name] did develop some maceration of the skin beneath the J-tube collar. This was addressed with daily cleansing and application of a barrier cream. No purulent drainage was noted at the J Tube exit site. He continued to experience minor nausea and vomiting at night, nightly Zofran was continued. His tube feeds were transitioned to 20 hrs due to fullness, nausea and vomiting. He tolerated this well without nausea and vomiting. He was felt medically stable for discharge but due to the complexity of his case and home health needs he remained an inpatient until [facility] outpatient follow up was arranged. His bulb drain dressing became saturated with brown drainage and the pocket around the exit site had increased in size. This was treated with regular dressing changes. His J tube continued to have drainage and wound care was consulted. They stated they could not give any recommendations due to the tightness of the sutures on the J tube face plate. Sutures were unable to be removed due to risk of J tube dislodging. Dressing changes with Gerhardts  barrier cream were continued. [facility] scheduled an outpatient appointment with Mr. Sontag for November 24, 2022. He would remain in the hospital until November 23, 2022 and attend his appointment the following day. Dr. Kipp [name] instructed for Zosyn and Diflucan to be discontinued on 10/29/22. Mr. Oelkers became anemic and required 1 unit of packed red blood cells, his anemia improved. Occult blood test was ordered but did not result. Lovenox was held. Anemia panel was also ordered and showed low ferrous and saturation. Pharmacy was called for assistance with iron supplementation. He was treated with IV Ferric gluconate. He began to experience nausea and vomiting again, he was started on Reglan and fiber supplement was decreased to once per day. His sinus tachycardia worsened, CTA was obtained and pulmonary embolism was ruled out. He developed an increasingly productive cough and fevers, he was restarted on empiric antibiotics. He developed respiratory distress with increasing cough and tachycardia. He was transferred to the ICU for further monitoring. He spiked a temperature of 103 with worsening respiratory distress. He went into septic shock and ultimately required intubation. Bronchoscopy was performed and revealed bile in his RUL, RML, RLL bronchus. Cultures were obtained and his ABX regimen was broadened for aspiration pneumonia which included IV Diflucan, Vancomycin and Zosyn. Blood cultures were obtained. Critical care consult was obtained for assistance with management. His propofol was switched to Precedex due to hypotension. He remained on Levophed 8mg/min and Vasopressin 0.03 on 11/07/22. He was started on stress dose steroids. He had an  ileus, tube feeds were slowed to trickle feeds. Blood cultures began growing gram negative rods, Vancomycin was discontinued, Zosyn was switched to Meropenem and Diflucan was transitioned to Micafungin. He was started on Epinephrine for continued hypotension. He had an AKI  and creatinine rose to 1.4. He became acidotic with a pH of 7.172 and lactic acid of 5.7 on 11/08/22. His hemodynamics and acidosis improved, Epinephrine and Vasopressin were weaned as hemodynamics tolerated. He converted to atrial fibrillation with RVR, he was given an IV Amiodarone bolus and started on Amiodarone gtt. Levophed was weaned as hemodynamics tolerated. Tube feeds were slowly increased. He converted to sinus tachycardia, Amiodarone was discontinued. Critical care proceeded with left sided thoracentesis due to pleural effusion. 400cc of fluid was obtained but there was an unexpected aspiration of air during thoracentesis so chest tube was placed to suction. Creatinine began trending down. TPN was started to ensure nutrition until he could tolerate tube feeds and ileus had resolved. Stress steroids were discontinued. Ventilator support was weaned as tolerated. There was no air leak from the left chest tube and no well-defined pneumothorax. Left sided chest tube was removed without complication.  On 11/12/2022, he was noted to have increased work of breathing and also required increase in vasopressor support. The CT scan of the chest abdomen and pelvis was repeated to evaluate the cause of pneumothorax and rule out bowel obstruction. CT showed large-volume pneumoperitoneum in upper abdomen, increased body wall edema and mesenteric edema, possible ileus in colon, and moderate retained stool as well as worsening dense consolidation at the left base. He remained afebrile. Nutrition support with TPN continued as well as IV antibiotics. Meropenem and Micafungin would be continued until November 25, 2022 per ID. He was started back on pressors for hemodynamic support. Milrinone was started and levophed was weaned as hemodynamics tolerated. Arterial line was placed for further monitoring. He passed a small amount of stool on 11/14/22 for the first time since 11/01/22. His anemia again worsened and he was  transfused with packed red blood cells. He had no signs or symptoms of active bleeding. He had anasarca and was volume overload, he was aggressively diuresed. He was making stool regularly, tube feeds were restarted. He became comatose with brainstem reflexes intact, sedation was weaned off. Ventilator support was unable to be weaned due to the severity of lung disease, tracheostomy was to be considered. Dr. Kipp [name] and Dr. Tamala Julian discussed goals of care with Mr. Skolnick's wife and she believed Mr. Garofano had enough suffering and would rather pass peacefully. No escalation in care would be made. Comfort measures including extubation were instituted once his family had arrived. He was ruled deceased on 12-19-22.   Pertinent Labs and Studies  Significant Diagnostic Studies CT HEAD WO CONTRAST (5MM)  Result Date: 11/17/2022 CLINICAL DATA:  Provided history: Memory loss. Pneumonia. EXAM: CT HEAD WITHOUT CONTRAST TECHNIQUE: Contiguous axial images were obtained from the base of the skull through the vertex without intravenous contrast. RADIATION DOSE REDUCTION: This exam was performed according to the departmental dose-optimization program which includes automated exposure control, adjustment of the mA and/or kV according to patient size and/or use of iterative reconstruction technique. COMPARISON:  No pertinent prior exams available for comparison. FINDINGS: Streak/beam hardening artifact arising from dental restoration limits evaluation of the posterior fossa. Brain: Mild generalized cerebral atrophy. 3.6 x 2.0 cm focus of abnormal hypodensity within the left superior frontal gyrus, predominantly located within the white matter but with some involvement of the overlying cortex (  for instance as seen on series 3, image 31) (series 6, image 37). No appreciable volume loss at this site. Foci of chronic encephalomalacia/gliosis within the anterior right frontal lobe, anterolateral left frontal lobe, lateral left  temporal lobe and anterior left temporal lobe, likely posttraumatic in etiology. There is no acute intracranial hemorrhage. No extra-axial fluid collection. No midline shift. Vascular: No hyperdense vessel. Atherosclerotic calcifications. Skull: No fracture or aggressive osseous lesion. Sinuses/Orbits: No mass or acute finding within the imaged orbits. Mild partial opacification of left ethmoid air cells due to the presence of mucosal thickening, and possibly fluid. Small-volume frothy secretions within the left sphenoid sinus. Trace mucosal thickening within the right sphenoid sinus. Other: Bilateral mastoid effusions. Extensive subcutaneous emphysema within the right temporal scalp, as well as facial and visualized upper neck subcutaneous soft tissues. Partially imaged ET tube. Impression #2 will be called to the ordering clinician or representative by the Radiologist Assistant, and communication documented in the PACS or [employer]. IMPRESSION: 1. Streak/beam hardening artifact arising from dental restoration limits evaluation of the posterior fossa. 2. 3.6 x 2.0 cm focus of abnormal hypodensity within the left superior frontal gyrus, predominantly located within the white matter but with some involvement of the overlying cortex. This could reflect a chronic infarct or chronic sequela of remote trauma. However, no definite volume loss is appreciated at this site, and alternative etiologies such as a recent infarct, edema related to a metastatic lesion or cerebritis cannot be excluded. A brain MRI (without and with contrast) is recommended for further evaluation. 3. Foci of chronic encephalomalacia/gliosis within the anterior right frontal lobe, anterolateral left frontal lobe, lateral left temporal lobe and anterior left temporal lobe, likely posttraumatic in etiology. 4. Mild generalized cerebral atrophy. 5. Paranasal sinus disease, as described. 6. Extensive subcutaneous emphysema within the right  temporal scalp, as well as facial and visualized upper neck soft tissues. Electronically Signed   By: Kellie Simmering D.O.   On: 11/17/2022 12:51   CT CHEST WO CONTRAST  Result Date: 11/17/2022 CLINICAL DATA:  Pneumonia. EXAM: CT CHEST WITHOUT CONTRAST TECHNIQUE: Multidetector CT imaging of the chest was performed following the standard protocol without IV contrast. RADIATION DOSE REDUCTION: This exam was performed according to the departmental dose-optimization program which includes automated exposure control, adjustment of the mA and/or kV according to patient size and/or use of iterative reconstruction technique. COMPARISON:  November 12, 2022. FINDINGS: Cardiovascular: No significant vascular findings. Normal heart size. No pericardial effusion. Mediastinum/Nodes: Endotracheal tube is in grossly good position. Stable position of esophageal stent. Extensive pneumomediastinum is again noted. Stable large calcified nodule seen in right thyroid lobe. Lungs/Pleura: No pneumothorax is noted. Small left pleural effusion is noted. Decreased opacity seen involving the left upper and lower lobes compared to prior exam, most consistent with mildly improving pneumonia. There remains patchy opacities throughout both lungs concerning for multifocal pneumonia. Stable position of 2 right-sided chest tubes. Upper Abdomen: Stable appearance of pneumoperitoneum as noted on prior exam. Musculoskeletal: No fracture or bony abnormality is noted. Extensive subcutaneous emphysema is noted which is significantly increased compared to prior exam and is worse on the left than the right. It extends into the supraclavicular and cervical soft tissues is well. IMPRESSION: Stable position of 2 right-sided chest tubes without definite pneumothorax. Mildly improved pneumonia of left upper and lower lobes is noted. Stable multifocal opacities are noted elsewhere suggesting multifocal pneumonia. Small left pleural effusion is noted. Extensive  pneumomediastinum is noted, with significantly increased subcutaneous emphysema  is seen involving both left and right chest walls as well as the supraclavicular and cervical soft tissues. Stable position of esophageal stent. Stable pneumoperitoneum is noted. Electronically Signed   By: Marijo Conception M.D.   On: 11/17/2022 12:33   DG Chest Port 1 View  Result Date: 11/15/2022 CLINICAL DATA:  Follow-up pneumomediastinum EXAM: PORTABLE CHEST 1 VIEW COMPARISON:  11/13/2022 FINDINGS: Heart size is normal. Endotracheal tube tip 3 cm above the carina. Esophageal stent appears the same. Right-sided power port tip at the SVC RA junction. Right chest tubes remain in place. Pneumoperitoneum visible between the liver in the diaphragm is similar to the prior exam. Pneumomediastinum appears slightly more extensive. Soft tissue emphysema in the upper chest region appears similar. Slight worsening of infiltrate and volume loss in the left lower lung. IMPRESSION: 1. Pneumomediastinum appears slightly more extensive. 2. Pneumoperitoneum visible in the right upper quadrant appears similar. 3. Slight worsening of infiltrate and volume loss in the left lower lung. Electronically Signed   By: Nelson Chimes M.D.   On: 11/15/2022 08:03   DG CHEST PORT 1 VIEW  Result Date: 11/13/2022 CLINICAL DATA:  Intubated. EXAM: PORTABLE CHEST 1 VIEW COMPARISON:  11/12/2022 FINDINGS: The endotracheal tube tip is poorly visualized due to overlapping bones and esophageal stent with its tip approximately 3.7 cm above the carina. Right jugular porta catheter with its tip in the superior vena cava. Left PICC tip in the region of the superior cavoatrial junction. Bilateral soft tissue emphysema is again demonstrated mild progression on the left. The right basilar pneumothorax appears somewhat smaller. Two left chest tubes are in place, 1 with its tip near the right lung apex in the other with its tip in the right lower lung zone laterally. Increased  left basilar airspace opacity. Probable small left pleural effusion without significant change. Stable prominence of the interstitial markings. Resolved mild right upper lung zone airspace opacity. No acute bony abnormality. IMPRESSION: 1. The endotracheal tube tip is poorly visualized due to overlapping bones and esophageal stent and appears to be grossly in satisfactory position. 2. The right basilar pneumothorax appears somewhat smaller. 3. Increased left basilar probable pneumonia. 4. Resolved mild right upper lung zone pneumonia or alveolar edema 5. Stable mild interstitial lung disease. 6. Mildly increased soft tissue emphysema on the left, stable on the right. Electronically Signed   By: Claudie Revering M.D.   On: 11/13/2022 13:06   CT CHEST ABDOMEN PELVIS WO CONTRAST  Result Date: 11/12/2022 CLINICAL DATA:  Esophageal cancer with partial esophagectomy, chemotherapy and XRT, complicated by anastomotic leak, with indwelling esophageal stent. On today's CXR, new subpulmonic pneumothorax was seen on the right with increased chest wall and supraclavicular emphysema and pneumomediastinum. Worsening opacities of the left lower lobe. EXAM: CT CHEST, ABDOMEN AND PELVIS WITHOUT CONTRAST TECHNIQUE: Multidetector CT imaging of the chest, abdomen and pelvis was performed following the standard protocol without IV contrast. RADIATION DOSE REDUCTION: This exam was performed according to the departmental dose-optimization program which includes automated exposure control, adjustment of the mA and/or kV according to patient size and/or use of iterative reconstruction technique. COMPARISON:  Portable chest films from January 21 through today, 2024, CTA chest 11/02/2022, and chest, abdomen and pelvis CT dated 10/12/2022. FINDINGS: CT CHEST FINDINGS Cardiovascular: Right chest port again noted with IJ approach catheter terminating just below the superior cavoatrial junction. Left PICC terminates just above this. There is  aortic atherosclerosis without aneurysm. There is no significant pericardial effusion. The cardiac size  is normal but slightly increased over the prior studies. Trace calcific plaque proximal LAD coronary artery only. The intraventricular blood pool is low in density consistent with anemia. The pulmonary trunk is prominent today measuring 3.5 cm, previously 4.1 cm, consistent with arterial hypertension. The right heart chambers are slightly larger today than previously which may indicate right heart strain or at least elevated right heart pressures. Pulmonary veins are decompressed. Mediastinum/Nodes: Again noted are postsurgical changes of esophagectomy and gastric pull-through, surgical anastomosis along the mid to lower aspect of the mediastinum with a partially fluid-filled esophageal stent in place. There is paraesophageal mediastinal air-fluid collection which has been seen on prior studies alongside the right posterolateral proximal stent, level of the carina measuring 3.3 x 1.9 cm, similar to the last CT and smaller than last month. There is interval worsening of now extensive pneumomediastinum, which is primarily prevascular in location and along the left cardiovascular structures, with interval worsening of extensive left greater than right chest wall emphysema extending into the left-greater-than-right supraclavicular fossae and neck base. A densely rim calcified 2.4 cm right thyroid nodule is unchanged in appearance. Endotracheal tube is in place with tip 3.5 cm from the carina. No intrathoracic or axillary adenopathy is seen without contrast. Lungs/Pleura: 2 right chest tubes are unchanged in positioning. There is a small amount of scattered air and fluid in the right pleural space. The amount of pleural fluid has decreased since 11/02/2022. The amount of scattered air in the right pleural space is only slightly greater today with no large or space-occupying pneumothorax. What was thought to be a right  subpulmonic pneumothorax on today's CXR is actually due to pneumoperitoneum. There is a small layering left pleural effusion, increased since 11/02/2022. There is increasingly dense consolidation in the left lower lobe with air bronchograms, and moderate patchy ground-glass disease increased in both upper lobes, right middle lobe. There is thin walled 1.1 cm peribronchiolar air cyst newly noted in the base of the right middle lobe. This could be due to elevated airway pressures or could be a small infectious cavity beginning to develop. There is increased interlobular septal thickening in the lung apices and bases consistent with pulmonary edema. At least some of the increased lung opacities are probably due to pulmonary edema. Scattered atelectasis and consolidation in the right lower lobe appears similar. Musculoskeletal: Mild thoracic kyphodextroscoliosis and degenerative change of the spine. No aggressive bone lesion. Extensive left greater than right chest wall emphysema as described above. CT ABDOMEN PELVIS FINDINGS Hepatobiliary: No focal liver abnormality without contrast. Unremarkable bile ducts. There are few tiny stones in the proximal gallbladder but no wall thickening. Pancreas: Partially atrophic and otherwise unremarkable. Spleen: No focal abnormality.  No splenomegaly. Adrenals/Urinary Tract: Unchanged slight nodular thickening both adrenal glands. Few tiny punctate nonobstructive caliceal stones left kidney. No focal abnormality in the unenhanced renal cortex. Bilateral perinephric stranding appears similar. There is no hydronephrosis or ureteral stone. There is mild bladder thickening or underdistention. Stomach/Bowel: Percutaneous jejunostomy tube is unchanged in positioning. No small bowel obstruction is seen. There is moderate retained stool ascending and transverse colon and increased dilatation of the ascending colon up to 12.5 cm, transverse colon up to 7.3 cm, decompressed left colon and  rectum. Uncomplicated scattered sigmoid diverticulosis. No wall thickening throughout the large bowel. Normal appendix. Vascular/Lymphatic: No significant vascular findings are present. No enlarged abdominal or pelvic lymph nodes. Reproductive: Unremarkable prostate. Other: Large-volume pneumoperitoneum in the upper abdomen. Small volume of increased perihepatic and pelvic  ascites. Etiology indeterminate. Some of the air tracks to the umbilicus and subcutaneously left of the midline. Has there been a recent laparoscopic procedure? Otherwise, etiology for the pneumoperitoneum is indeterminate and a hollow viscus perforation is not excluded. There is mild body wall anasarca, and there is increased haziness in the mesentery consistent with congestion or peritonitis. There are no incarcerated hernias. Musculoskeletal: No acute or significant osseous findings. IMPRESSION: 1. Large-volume pneumoperitoneum in the upper abdomen, with some of the air tracking to the umbilicus and subcutaneously left of the midline. Has there been a recent laparoscopic procedure? Otherwise, etiology for the pneumoperitoneum is indeterminate and a hollow viscus perforation is not excluded. 2. Increasingly extensive pneumomediastinum and chest wall emphysema. 3. 2 right chest tubes are unchanged in positioning, with small amount of scattered air and fluid in the right pleural space. The amount of pleural fluid has decreased since 11/02/2022. What was thought to be a right subpulmonic pneumothorax on today's CXR is actually due to the pneumoperitoneum. 4. Increasingly dense consolidation in the left lower lobe with air bronchograms, and increased moderate patchy ground-glass disease in both upper lobes and right middle lobe. Findings consistent with alveolar edema and/or pneumonia. 5. Increased interlobular septal thickening in the lung apices and bases consistent with pulmonary edema. 6. 1.1 cm thin-walled air cyst in the base of the right  middle lobe. This could be due to elevated airway pressures or could be a small infectious cavity beginning to develop. 7. Increased prominence of the pulmonary trunk and right heart chambers which may indicate right heart strain or elevated right heart pressures. 8. Increased body wall edema and increased mesenteric edema, the latter which could be congestive or due to peritonitis. 9. Increased gas distention of the ascending and transverse colon without wall thickening. Possible ileus. Moderate retained stool. 10. Aortic and coronary artery atherosclerosis. 11. Support devices and percutaneous jejunostomy tube. 12. Anemia. 13. Cholelithiasis. 14. Nonobstructive micronephrolithiasis. 15. Discussed over the phone with Dr. Prudencio Burly at 10:50 p.m., 11/12/2022, with verbal acknowledgement of all key findings. Aortic Atherosclerosis (ICD10-I70.0). Electronically Signed   By: Telford Nab M.D.   On: 11/12/2022 22:56   DG CHEST PORT 1 VIEW  Result Date: 11/12/2022 CLINICAL DATA:  62 year old male with history of subcutaneous emphysema. EXAM: PORTABLE CHEST 1 VIEW COMPARISON:  Chest x-ray 11/11/2022. FINDINGS: An endotracheal tube is in place with tip 5.7 cm above the carina. Right internal jugular single-lumen power porta cath with tip terminating in the distal superior vena cava. Esophageal stent again noted, unchanged in position. Right-sided chest tube noted with tip in the mid right hemithorax, unchanged in position. Despite this right-sided chest tube, there is now a moderate right pneumothorax, which appears loculated in the base of the right hemithorax. No appreciable left pneumothorax. However, there does appear to be some pneumomediastinum. In addition, there is subcutaneous emphysema, most evident in the cervical regions bilaterally and along the left chest wall. Small left pleural effusion. Diffuse interstitial prominence, widespread peribronchial cuffing, and patchy multifocal airspace consolidation noted  throughout the lungs bilaterally, most severe in the left mid to lower lung and in the right upper lobe, with worsened aeration compared to the prior study. Heart size is normal. IMPRESSION: 1. Support apparatus, as above. 2. Interval development of a moderate right pneumothorax which appears predominantly sub pulmonic. 3. Pneumomediastinum and subcutaneous emphysema, as above. Findings suggest potential esophageal perforation. 4. Worsening bilateral multilobar pneumonia. 5. Small left pleural effusion. Electronically Signed   By: Vinnie Langton  M.D.   On: 11/12/2022 08:30   DG Chest Port 1 View  Result Date: 11/11/2022 CLINICAL DATA:  Pneumothorax EXAM: PORTABLE CHEST 1 VIEW COMPARISON:  11/11/2022 FINDINGS: Endotracheal tube tip is 5.1 cm above the carina. Power injectable right Port-A-Cath tip: SVC. A right chest tube is in place. Kinked tubing projects over the left lower chest No well-defined pneumothorax currently. Blunting of the right lateral costophrenic angle, possible right pleural effusion. Indistinct airspace opacity at the left lung base similar to previous. Midthoracic esophageal stent noted. There is subcutaneous emphysema in the neck, left greater than right. I cannot totally exclude the possibility of upper pneumomediastinum. IMPRESSION: 1. No well-defined pneumothorax currently. Right chest tube remains in place. 2. Subcutaneous emphysema in the neck, left greater than right. I cannot totally exclude the possibility of upper pneumomediastinum. 3. Indistinct airspace opacity at the left lung base, possibly from atelectasis or pneumonia. Continued opacity of the right lung base and blunting of the right lateral costophrenic angle, possible small right pleural effusion. 4. Endotracheal tube tip is 5.1 cm above the carina. Electronically Signed   By: Van Clines M.D.   On: 11/11/2022 10:53   DG Abd 1 View  Result Date: 11/11/2022 CLINICAL DATA:  Ileus EXAM: ABDOMEN - 1 VIEW  COMPARISON:  CT scan 10/12/2022 FINDINGS: Bibasilar airspace opacities. Blunted right costophrenic angle. Presumed jejunostomy tube in the left abdomen. Mild prominence of gas in the colon, especially the right colon. No dilated small bowel is identified. Most of the small bowel is gasless. IMPRESSION: 1. Mild prominence of gas in the colon, especially the right colon. No dilated small bowel is identified. Most of the small bowel is gasless. 2. Bibasilar airspace opacities. 3. Blunted right costophrenic angle, possibly from a small effusion. Electronically Signed   By: Van Clines M.D.   On: 11/11/2022 10:50   DG CHEST PORT 1 VIEW  Result Date: 11/11/2022 CLINICAL DATA:  Respiratory failure. EXAM: PORTABLE CHEST 1 VIEW COMPARISON:  11/10/2022 FINDINGS: Endotracheal tube in satisfactory position. The left PICC tip is at the origin of the superior vena cava. The right jugular porta catheter tip is in the superior vena cava. Interval tiny left apical pneumothorax and left supraclavicular soft tissue air. A small bore left chest tube remains in place and is folded back upon itself in the left pleural space. Mildly improved patchy density in the left lower lung zone. Mildly increased small right pleural effusion. The right chest tube is unchanged with no right pneumothorax seen. Mild decrease in prominence of the pulmonary vasculature and interstitial markings. IMPRESSION: 1. Interval tiny left apical pneumothorax and left supraclavicular soft tissue air. 2. The small bore left chest tube is folded back upon itself and possibly kinked. 3. Mildly improved left basilar atelectasis or pneumonia. 4. Mildly increased small right pleural effusion. 5. Mildly improved pulmonary vascular congestion and interstitial pulmonary edema. These results will be called to the ordering clinician or representative by the Radiologist Assistant, and communication documented in the PACS or [employer]. Electronically Signed    By: Claudie Revering M.D.   On: 11/11/2022 08:28   DG CHEST PORT 1 VIEW  Result Date: 11/10/2022 CLINICAL DATA:  Chest tube placement. EXAM: PORTABLE CHEST 1 VIEW COMPARISON:  Chest x-ray from same day at 0835 hours. FINDINGS: New left-sided small bore chest tube with decreased left pleural effusion. No pneumothorax. Unchanged endotracheal tube and two right-sided chest tubes. Unchanged right chest wall port catheter and left upper extremity PICC line esophageal  stent. Stable cardiomediastinal silhouette with normal heart size. Continued consolidation the left lung base. Unchanged chronic right pleural thickening and small effusion. No acute osseous abnormality. IMPRESSION: 1. New left-sided small bore chest tube with decreased left pleural effusion. No pneumothorax. 2. Unchanged left lung pneumonia. Electronically Signed   By: Titus Dubin M.D.   On: 11/10/2022 14:13   DG CHEST PORT 1 VIEW  Result Date: 11/10/2022 CLINICAL DATA:  Respiratory failure EXAM: PORTABLE CHEST 1 VIEW COMPARISON:  11/08/2022 FINDINGS: Right pleural effusion or pleural thickening. Large area of alveolar consolidation left mid to lower lung. Moderate left-sided pleural effusion. Prominent cardiac silhouette. Calcified aorta. Postop changes esophagectomy. Endotracheal tube tip at the thoracic inlet. Left PICC tip overlies proximal SVC. Right sided Port-A-Cath tip overlies distal SVC. Right-sided chest tube in place. IMPRESSION: Pleural effusion or thickening right base. Large area of consolidation left mid to lower lung. No change compared to prior study. Electronically Signed   By: Sammie Bench M.D.   On: 11/10/2022 08:59   DG Chest Port 1 View  Result Date: 11/08/2022 CLINICAL DATA:  Dyspnea EXAM: PORTABLE CHEST 1 VIEW COMPARISON:  11/07/2022 FINDINGS: Endotracheal tube 5.6 cm above the carina. Esophageal stent again noted within the mid esophagus, unchanged in position with its proximal margin at the level of the clavicular  heads. Left upper extremity PICC line tip seen at least to the level of the superior vena cava though exact localization is difficult due to underpenetration. Right internal jugular chest port tip seen at the superior cavoatrial junction. Pulmonary insufflation is stable. Dual right chest tubes are unchanged in position. Right-sided volume loss and small right pleural effusion or pleural thickening again noted, unchanged. Discoid atelectasis within the right mid lung zone. Progressive consolidation within the left lung base. No pneumothorax or pleural effusion on the left. Cardiac size within normal limits. IMPRESSION: 1. Stable support lines and tubes. Esophageal stent unchanged in position. 2. Stable right-sided volume loss and small right pleural effusion or pleural thickening. No pneumothorax. 3. Progressive left basilar consolidation. Electronically Signed   By: Fidela Salisbury M.D.   On: 11/08/2022 03:22   DG CHEST PORT 1 VIEW  Result Date: 11/07/2022 CLINICAL DATA:  62 year old male with history of pneumonia. EXAM: PORTABLE CHEST 1 VIEW COMPARISON:  Chest x-ray 11/06/2022. FINDINGS: An endotracheal tube is in place with tip 6.0 cm above the carina. Right internal jugular power Port-A-Cath with tip terminating at the superior cavoatrial junction. Two right-sided chest tubes are noted with tips projecting over the right hemithorax. Esophageal stent noted. Lung volumes are slightly low. Elevation of the right hemidiaphragm. Diffuse interstitial prominence and peribronchial cuffing noted, with patchy multifocal airspace consolidation scattered throughout the lungs bilaterally, most confluent in the right mid lung and left mid to lower lung. Overall, aeration appears slightly improved in the right lung, but worsened throughout the left mid to lower lung. Moderate right pleural effusion. No definite left pleural effusion. No appreciable pneumothorax. No evidence of pulmonary edema. Heart size is normal.  IMPRESSION: 1. Support apparatus, as above. 2. Severe multilobar bilateral pneumonia, slightly improved on the right and worsened on the left when compared to the prior study. 3. Moderate right pleural effusion. Electronically Signed   By: Vinnie Langton M.D.   On: 11/07/2022 07:59   DG CHEST PORT 1 VIEW  Result Date: 11/06/2022 CLINICAL DATA:  Acute respiratory failure. EXAM: PORTABLE CHEST 1 VIEW COMPARISON:  Lung 11/05/2022, earlier same day FINDINGS: Endotracheal tube is new in the  interval with tip position 4.5 cm above the base of the carina. 2 right chest tubes again noted with diffuse interstitial and patchy airspace disease in the right lung. Patchy airspace opacity in the left mid and lower lung is similar to prior. Cardiopericardial silhouette is at upper limits of normal for size. Esophageal stent device again noted. Telemetry leads overlie the chest. IMPRESSION: 1. Interval intubation with endotracheal tube tip 4.5 cm above the base of the carina. 2. Otherwise no substantial interval change in cardiopulmonary exam. Electronically Signed   By: Misty Stanley M.D.   On: 11/06/2022 10:27   DG CHEST PORT 1 VIEW  Result Date: 11/06/2022 CLINICAL DATA:  Dyspnea EXAM: PORTABLE CHEST 1 VIEW COMPARISON:  Yesterday FINDINGS: Esophageal stent in place. Chest tubes and porta catheter on the right. Airspace disease and pleural fluid on the right with increased density in the perihilar lung. New patchy airspace disease in the left lower lung. Stable heart size and mediastinal contours. Small right apical pneumothorax. IMPRESSION: Worsening aeration with patchy airspace pattern favoring pneumonia. Stable hardware positioning.  Small right apical pneumothorax. Electronically Signed   By: Jorje Guild M.D.   On: 11/06/2022 09:34   [place] EKG SITE RITE  Result Date: 11/06/2022 If Site Rite image not attached, placement could not be confirmed due to current cardiac rhythm.  DG CHEST PORT 1 VIEW  Result  Date: 11/05/2022 CLINICAL DATA:  Respiratory distress. EXAM: PORTABLE CHEST 1 VIEW COMPARISON:  10/23/2022. FINDINGS: The heart size and mediastinal contours are stable. An esophageal stent is in place. Interstitial and airspace opacities are noted in the right lung and at the left lung base. There is a small to moderate loculated pleural effusion on the right with chest tubes in place. No pneumothorax. A right chest port is stable in position. IMPRESSION: 1. Small to moderate loculated pleural effusion on the right with chest tubes in place. 2. Interstitial and airspace opacities in the right lung and at the left lung base, increased from the prior exam. Electronically Signed   By: Brett Fairy M.D.   On: 11/05/2022 23:48   CT Angio Chest Pulmonary Embolism (PE) W or WO Contrast  Result Date: 11/02/2022 CLINICAL DATA:  Positive D-dimer, pulmonary embolism suspected EXAM: CT ANGIOGRAPHY CHEST WITH CONTRAST TECHNIQUE: Multidetector CT imaging of the chest was performed using the standard protocol during bolus administration of intravenous contrast. Multiplanar CT image reconstructions and MIPs were obtained to evaluate the vascular anatomy. RADIATION DOSE REDUCTION: This exam was performed according to the departmental dose-optimization program which includes automated exposure control, adjustment of the mA and/or kV according to patient size and/or use of iterative reconstruction technique. CONTRAST:  40m OMNIPAQUE IOHEXOL 350 MG/ML SOLN COMPARISON:  10/23/2022, 10/12/2022 FINDINGS: Cardiovascular: This is a technically adequate evaluation of the pulmonary vasculature. No filling defects or pulmonary emboli. The heart is unremarkable without pericardial effusion. No evidence of thoracic aortic aneurysm or dissection. Stable aortic atherosclerosis. Mediastinum/Nodes: Stable postsurgical changes from esophagectomy and gastric pull-through procedure. Esophageal stent is unchanged. Continued extraluminal gas and  fluid collection along the right lateral aspect at the proximal margin of the esophageal stent, near the gastroesophageal anastomosis. This fluid collection measures approximately 3.4 x 1.9 cm, previously measuring 3.7 x 2.2 cm. The thyroid and trachea are stable. No discrete pathologic adenopathy. Lungs/Pleura: Stable position of the 2 indwelling right-sided pleural drainage catheters. Stable right pleural thickening, trace multilocular right pleural effusion, and small amount of pleural gas along the anterior margin of the  superior pleural drainage catheter. Patchy areas of consolidation within the right lung are unchanged consistent with atelectasis. Trace left pleural effusion has developed in the interim. Otherwise the left lung is clear. Upper Abdomen: No acute abnormality. Musculoskeletal: No acute or destructive bony lesions. Reconstructed images demonstrate no additional findings. Review of the MIP images confirms the above findings. IMPRESSION: 1. No evidence of pulmonary embolus. 2. Stable postsurgical changes from esophagectomy and gastric pull-through procedure, with no change in position of the esophageal stent seen previously. 3. Stable defect right lateral aspect of the proximal gastroesophageal junction, with persistent extraluminal gas and fluid collection communicating with the right pleural space. No significant change in size of this collection. 4. Stable positioning of the right-sided chest tubes, with no significant change in the small complex multilocular right hydropneumothorax and right pleural thickening seen previously. 5. Persistent patchy areas of atelectasis within the right lung. 6. New trace left pleural effusion. 7.  Aortic Atherosclerosis (ICD10-I70.0). Electronically Signed   By: Randa Ngo M.D.   On: 11/02/2022 22:46   DG CHEST PORT 1 VIEW  Result Date: 10/23/2022 CLINICAL DATA:  Postoperative state. EXAM: PORTABLE CHEST 1 VIEW COMPARISON:  October 03, 2022 FINDINGS: The  esophageal stent is in stable position. Other support apparatus is stable. The small right pleural effusion appears a little larger towards the apex. The cardiomediastinal silhouette is stable. The left lung remains clear. No pneumothorax. No other acute abnormalities. IMPRESSION: 1. Support apparatus as above. 2. The small right pleural effusion appears a little larger towards the apex. A loculated component is not excluded. 3. No other changes. Electronically Signed   By: Dorise Bullion III M.D.   On: 10/23/2022 09:41    Lab Basic Metabolic Panel: Recent Labs  Lab 11/15/22 0340 11/16/22 0418 11/16/22 1113 11/17/22 0359 11/18/22 0619 Dec 01, 2022 0414  NA 148* 143 137 137 135 138  K 4.1 4.6 4.4 4.0 3.1* 3.2*  CL 96* 85*  --  84* 86* 93*  CO2 45* >45*  --  44* 35* 35*  GLUCOSE 100* 129*  --  140* 142* 140*  BUN 40* 44*  --  55* 65* 65*  CREATININE 0.59* 0.64  --  0.64 0.66 0.63  CALCIUM 10.1 10.1  --  10.1 10.0 10.0  MG 1.9 2.2  --  2.3 2.4 2.3  PHOS 3.2 3.6  --  4.2 3.2 2.6   Liver Function Tests: Recent Labs  Lab 11/15/22 0340 11/16/22 0418 11/17/22 0359 11/18/22 0619 12-01-22 0414  AST 38 40 38 38 42*  ALT 45* 37 32 29 29  ALKPHOS 80 89 91 87 89  BILITOT 0.7 0.9 0.9 0.7 0.8  PROT 5.4* 5.7* 6.4* 6.2* 6.5  ALBUMIN 2.7* 2.4* 2.4* 2.2* 2.3*   No results for input(s): "LIPASE", "AMYLASE" in the last 168 hours. Recent Labs  Lab 11/16/22 0851  AMMONIA 37*   CBC: Recent Labs  Lab 11/15/22 0340 11/16/22 0418 11/16/22 1113 11/17/22 0359 11/18/22 0630 01-Dec-2022 0414  WBC 11.9* 14.4*  --  16.0* 15.2* 11.9*  HGB 6.4* 8.0* 9.5* 8.8* 8.9* 9.2*  HCT 21.6* 27.2* 28.0* 29.3* 28.4* 30.3*  MCV 104.9* 103.0*  --  102.4* 99.6 100.3*  PLT 70* 78*  --  93* 113* 135*   Sepsis Labs: Recent Labs  Lab 11/15/22 0340 11/16/22 0418 11/17/22 0359 11/18/22 0630 December 01, 2022 0414  PROCALCITON 2.31 1.70 1.49  --   --   WBC 11.9* 14.4* 16.0* 15.2* 11.9*    Procedures/Operations  08/18/2022 Patient:  Mindi Slicker Pina Pre-Op Dx: Distal esophageal cancer   Post-op Dx:  same Procedure: - Esophagoscopy - Robotic assisted laparoscopy - Robotic assisted thoracoscopy - Ivor-Lewis esophagectomy - Pyloromyotomy - Laparoscopic jejunostomy tube placement 34F - Intercostal nerve block Surgeon and Role:      * Lajuana Matte, MD - Primary  08/29/2022 Patient:  Danie Chandler Pre-Op Dx: Bernerd Limbo esophagectomy Anastomotic leak   Post-op Dx:  same Procedure: - Esophagogastroscopy - 125 mm covered esophageal stent placement - R VATS Surgeon and Role:      * Lajuana Matte, MD - Primary Anesthesia  general EBL:  10 ml Blood Administration: none Specimen:  none    09/21/2022 Patient:  Mindi Slicker Kurtenbach Pre-Op Dx: Bernerd Limbo esophagectomy Anastomotic leak   Post-op Dx:  same Procedure: - Esophagogastroscopy - Repositioning of 125 mm covered esophageal stent placement - Esophagram - R VATS Surgeon and Role:      * Lajuana Matte, MD - Primary Anesthesia  general EBL:  10 ml Blood Administration: none Specimen:  none    [facility] Patient Name: Ansar Skoda Procedure Date : 10/03/2022 MRN: [id number] Attending MD: Justice Britain , MD, [id number] Date of Birth: 1961-01-02 CSN: [id number] Age: 62 Admit Type: Inpatient Procedure:                Upper GI endoscopy Indications:              Therapeutic procedure, Abnormal UGI series Providers:                Justice Britain, MD, [place], PA-C 11/21/2022, 3:44 PM

## 2022-12-16 NOTE — Progress Notes (Signed)
Per CCM order, RT extubated pt to comfort care measures, room air. RN and family currently at bedside.

## 2022-12-16 NOTE — Progress Notes (Signed)
   NAME:  [name] [name], MRN:  [id number], DOB:  October 03, 1961, LOS: 68 ADMISSION DATE:  10/12/2022, CONSULTATION DATE: 11/06/2022 REFERRING MD: Dr. 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  Overnight patient became short of breath, started spiking fever so he was transferred to ICU.  This morning patient was started spiking fever to 103, remained tachycardic and tachypneic, PCCM was called for help evaluation medical management  Pertinent  Medical History   Past Medical History:  Diagnosis Date   Cancer [facility])    Esophageal Cancer   History of radiation therapy    Esophagus- 05/12/22-06/22/22- Dr. Gery Pray   Port-A-Cath in place 05/11/2022   Significant Hospital Events: Including procedures, antibiotic start and stop dates in addition to other pertinent events   12/1 admitted for drainage from empyema 12/13 EGD with stent placement. 1/21 intubated for aspiration 1/26 started on TPN, still not stooling.  Interim History / Subjective:  Comfortable on vent  Objective   Blood pressure (!) 89/57, pulse 66, temperature 98.4 F (36.9 C), temperature source Oral, resp. rate (!) 29, height '5\' 10"'$  (1.778 m), weight 79.1 kg, SpO2 98 %.    Vent Mode: PRVC FiO2 (%):  [40 %] 40 % Set Rate:  [22 bmp-28 bmp] 28 bmp Vt Set:  [560 mL] 560 mL PEEP:  [8 cmH20] 8 cmH20 Plateau Pressure:  [26 cmH20-28 cmH20] 26 cmH20   Intake/Output Summary (Last 24 hours) at 28-Nov-2022 6606 Last data filed at 2022-11-28 0725 Gross per 24 hour  Intake 3674.97 ml  Output 4110 ml  Net -435.03 ml    Filed Weights   11/17/22 0400 11/18/22 0500 2022-11-28 0425  Weight: 82.6 kg 83 kg 79.1 kg    Examination: Ill appearing on vent Was following  commands yesterday but this am is comatose Lungs with rhonci Subcutaneous air stable RASS -5  Labs reviewed  Assessment & Plan:  Acute hypoxic and hypercapnic respiratory failure due E coli Aspiration pneumonia-LUL, RLL with ARDS; working on diuresis but really having trouble weaning O2 due to severity of lung disease.  Septic shock due to bilateral multifocal pneumonia; improved/resolved Right-sided empyema s/p VATS, currently with chest tube placed and ongoing output from anastomotic leak Acute septic encephalopathy- lingering even off sedation, air hungry Moderate protein energy malnutrition- POA, on TPN Constipation- improved Afib new onset- resolved, hold on full AC for now Anemia of chronic disease- worsened during stay, got unit 1/30 with good response.  No s/s of bleeding. Adenocarcinoma esophagus s/p esophagectomy, adjuvant chemoradiation Anastomotic leak s/p esophageal stent: ongoing issue; eventual potential revision at [facility] if can get recovered Hyperglycemia due to acute illness, steroids. A1c is 4.3 CT head abnormality- chasing this will not address overall prognosis  See IPAL 2/2 Continue vent support for now Stop TPN Family coming in later this am and we will extubate with fent+precedex to ease any signs of distress, allow natural peaceful passing  Best Practice (right click and "Reselect all SmartList Selections" daily)   Diet/type: TF, TPN DVT prophylaxis: prophylactic heparin  GI prophylaxis: PPI Lines: Central line and yes and it is still needed-picc Foley:  N/A Code Status:  DNR Last date of multidisciplinary goals of care discussion [IPAL 11/18/22]  33 min cc time Erskine Emery MD PCCM

## 2022-12-16 DEATH — deceased

## 2023-11-04 IMAGING — US US THYROID
1 series · 12 of 25 positions shown · non-contrast
Comparison: None.

CLINICAL DATA: Thyroid mass of unclear etiology

EXAM:
THYROID ULTRASOUND
TECHNIQUE: Ultrasound examination of the thyroid gland and adjacent soft
tissues was performed.

[Series 1: us thyroid · 12 of 125 slices shown]
[im 6/125]
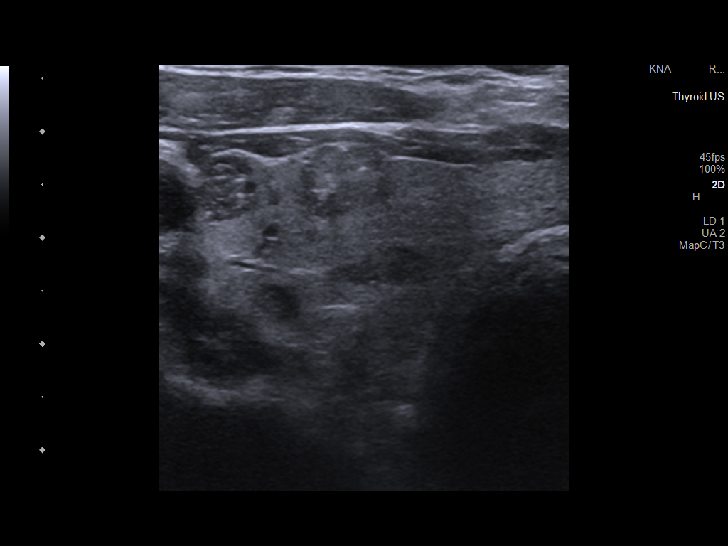
[im 16/125]
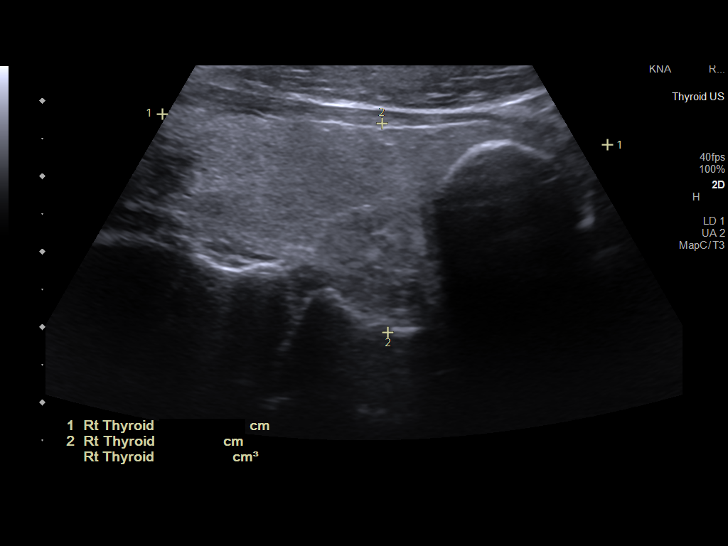
[im 26/125]
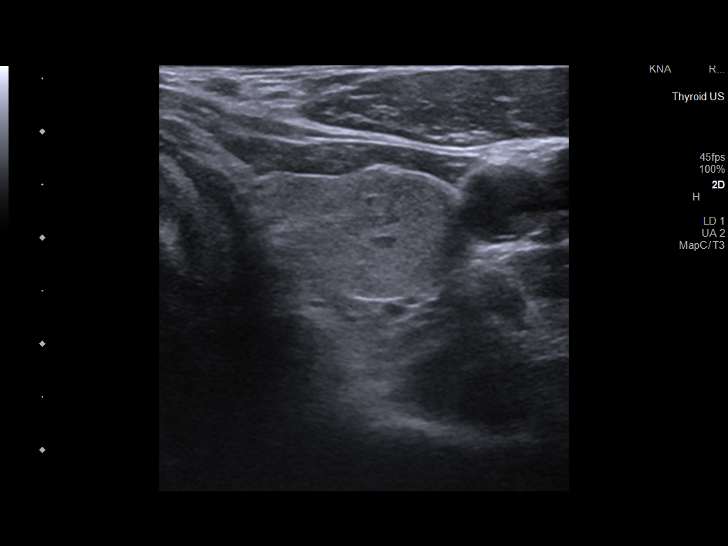
[im 37/125]
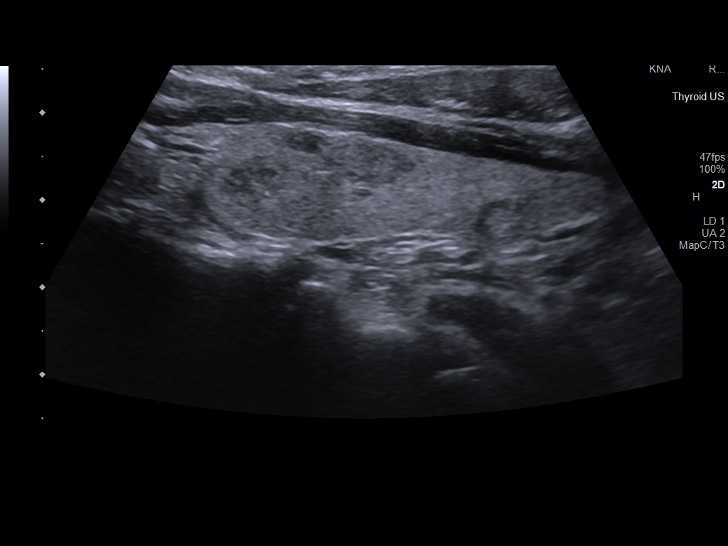
[im 47/125]
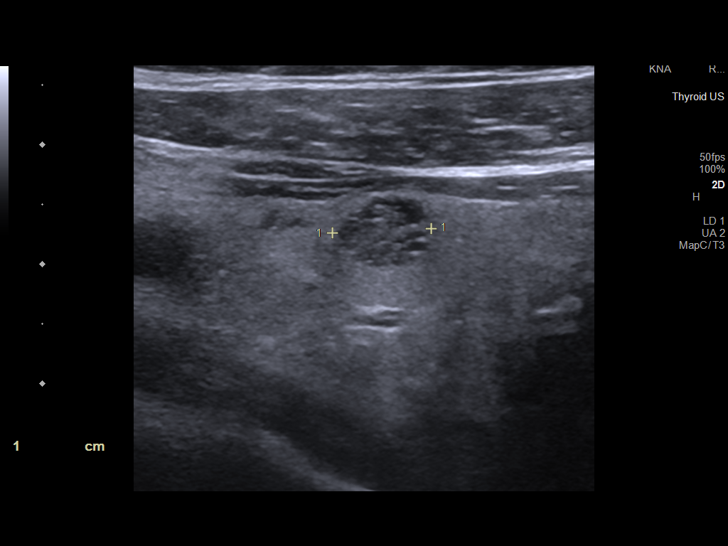
[im 57/125]
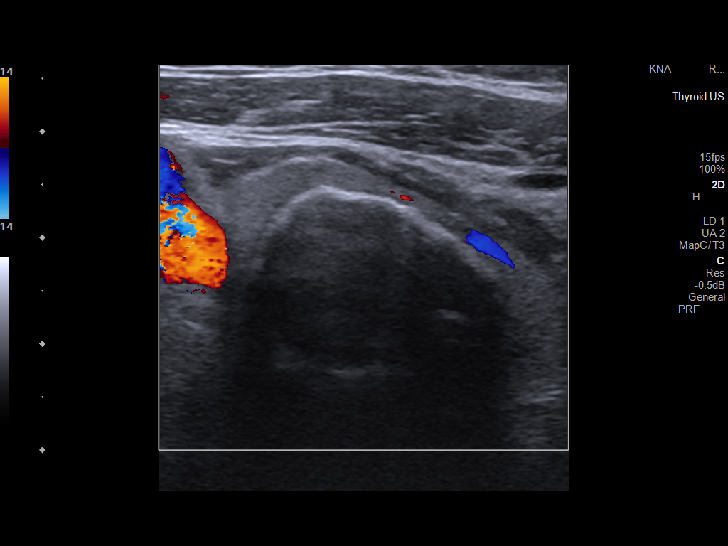
[im 68/125]
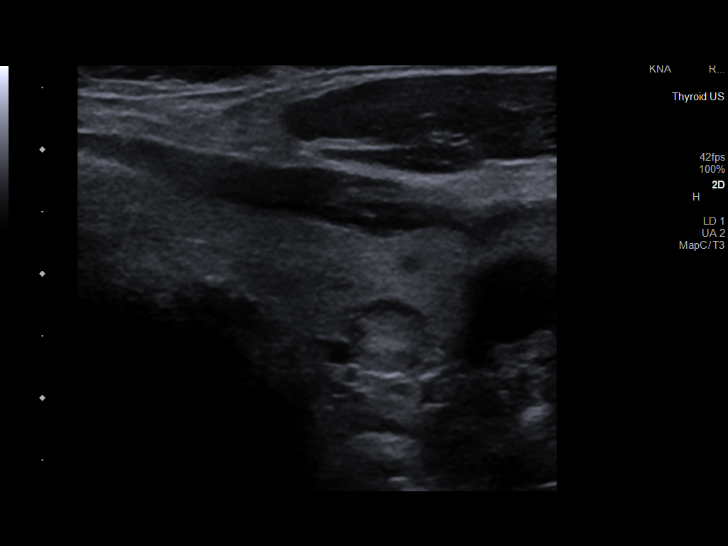
[im 78/125]
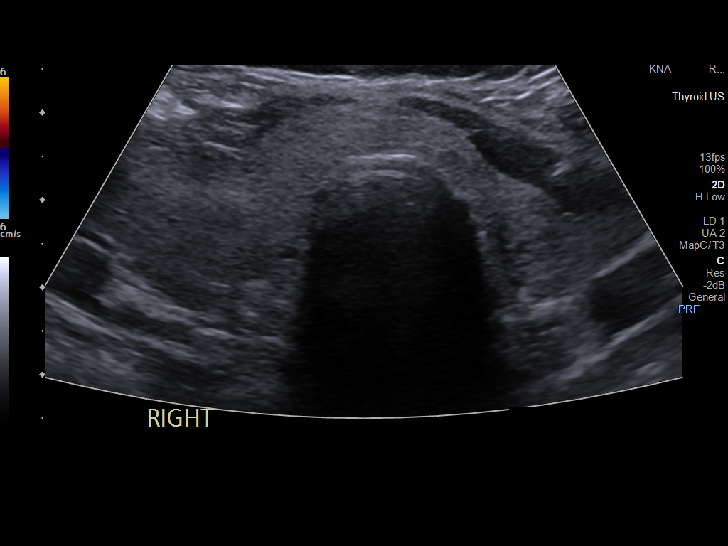
[im 88/125]
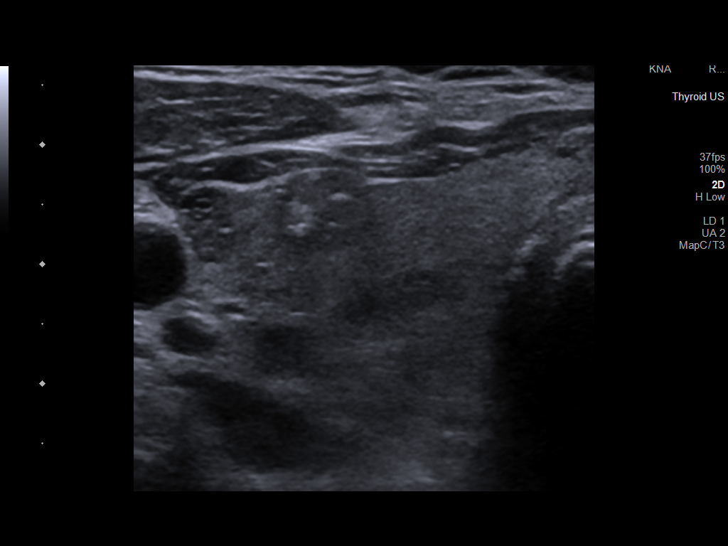
[im 99/125]
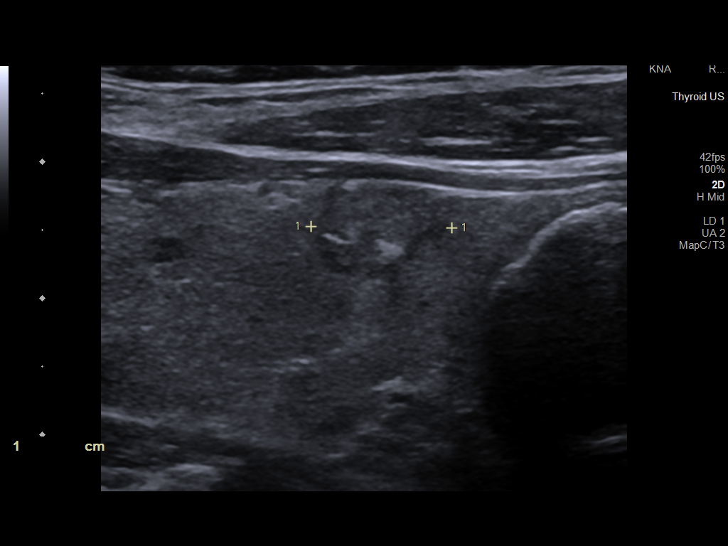
[im 109/125]
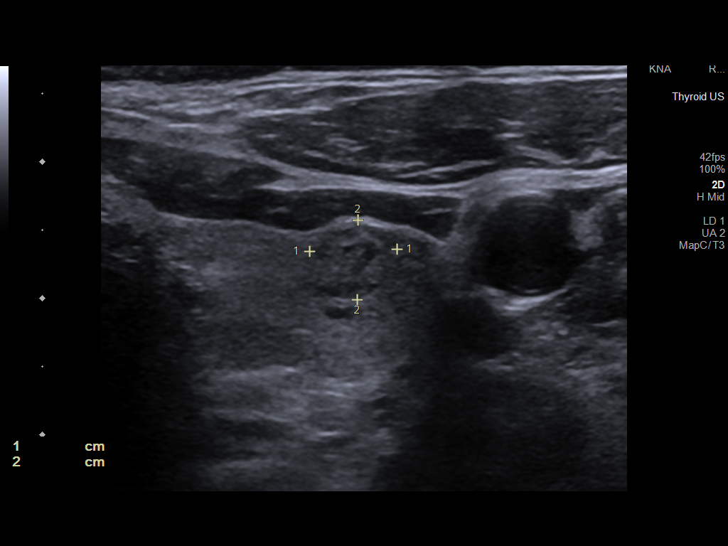
[im 119/125]
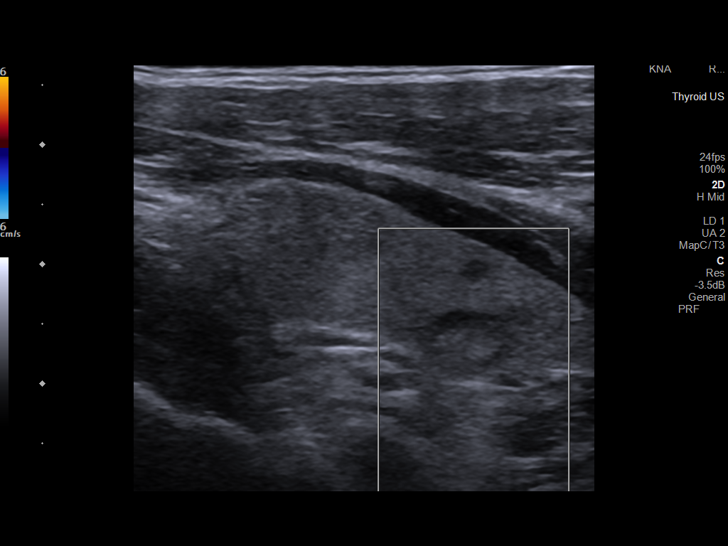

[12 of 25 positions shown; findings below may reference images not displayed]

FINDINGS: Parenchymal Echotexture: Mildly heterogenous

Isthmus: 0.5 cm

Right lobe: 5.9 x 3.0 x 2.8 cm

Left lobe: 4.5 x 2.3 x 1.4 cm

_________________________________________________________

Estimated total number of nodules >/= 1 cm: 3

Number of spongiform nodules >/=  2 cm not described below (TR1): 0

Number of mixed cystic and solid nodules >/= 1.5 cm not described
below (TR2): 0

_________________________________________________________

Nodule 1: 0.8 cm spongiform nodule in the superior right thyroid
lobe does not meet criteria for imaging surveillance or FNA.

_________________________________________________________

Nodule # 2:

Location: Right; superior

Maximum size: 1.0 cm; Other 2 dimensions: 0.8 x 0.8 cm

Composition: solid/almost completely solid (2)

Echogenicity: hypoechoic (2)

Shape: not taller-than-wide (0)

Margins: smooth (0)

Echogenic foci: macrocalcifications (1)

ACR TI-RADS total points: 5.

ACR TI-RADS risk category: TR4 (4-6 points).

ACR TI-RADS recommendations:

*Given size (>/= 1 - 1.4 cm) and appearance, a follow-up ultrasound
in 1 year should be considered based on TI-RADS criteria.

_________________________________________________________

Nodule # 3:

Location: Right; inferior

Maximum size: 2.5 cm; Other 2 dimensions: 2.4 x 1.9 cm

Composition: cannot determine (2)

Echogenicity: cannot determine (1)

Shape: not taller-than-wide (0)

Margins: cannot determine (0)

Echogenic foci: peripheral calcifications (2)

ACR TI-RADS total points: 5.

ACR TI-RADS risk category: TR4 (4-6 points).

ACR TI-RADS recommendations:

**Given size (>/= 1.5 cm) and appearance, fine needle aspiration of
this moderately suspicious nodule should be considered based on
TI-RADS criteria.

_________________________________________________________

Nodule # 4:

Location: Left; superior

Maximum size: 1.0 cm; Other 2 dimensions: 0.6 x 0.6 cm

Composition: solid/almost completely solid (2)

Echogenicity: hypoechoic (2)

Shape: not taller-than-wide (0)

Margins: ill-defined (0)

Echogenic foci: none (0)

ACR TI-RADS total points: 4.

ACR TI-RADS risk category: TR4 (4-6 points).

ACR TI-RADS recommendations:

*Given size (>/= 1 - 1.4 cm) and appearance, a follow-up ultrasound
in 1 year should be considered based on TI-RADS criteria.

_________________________________________________________

Nodule 5: 0.8 cm isoechoic solid nodule in the inferior left thyroid
lobe does not meet criteria for imaging surveillance or FNA.

_________________________________________________________

Nodule # 6: 0.9 x 0.7 x 0.6 cm isoechoic nodule in the inferior left
thyroid lobe does not meet criteria for imaging surveillance or FNA.
IMPRESSION: 1. Nodule 3 (TI-RADS 4), measuring 2.5 cm, located in the inferior
right thyroid lobe, meets criteria for FNA.
2. Nodule 2 (TI-RADS 4), measuring 1.0 cm, located in the superior
right thyroid lobe, meets criteria for imaging follow-up. Annual
ultrasound surveillance is recommended until 5 years of stability is
documented.
3. Nodule 4 (TI-RADS 4), measuring 1.0 cm, located in the superior
left thyroid lobe, meets criteria for imaging follow-up. Annual
ultrasound surveillance is recommended until 5 years of stability is
documented.

The above is in keeping with the ACR TI-RADS recommendations - [HOSPITAL] 1242;[DATE].

## 2023-11-12 IMAGING — US US FNA BIOPSY THYROID 1ST LESION
1 series · 13 of 24 positions shown · non-contrast
Comparison: US Thyroid 02/09/22

MEDICATIONS:
1% lidocaine 10 cc

COMPLICATIONS:
None immediate.

INDICATION: Right inferior thyroid nodule

2.5 cm
EXAM:
ULTRASOUND GUIDED FINE NEEDLE ASPIRATION OF INDETERMINATE THYROID
NODULE
TECHNIQUE: Informed written consent was obtained from the patient after a
discussion of the risks, benefits and alternatives to treatment.
Questions regarding the procedure were encouraged and answered. A
timeout was performed prior to the initiation of the procedure.

[Series 1: us fna bx thyroid 1st lesion afirma · 24 acquisitions, 13 frames shown]
[im 1/24]
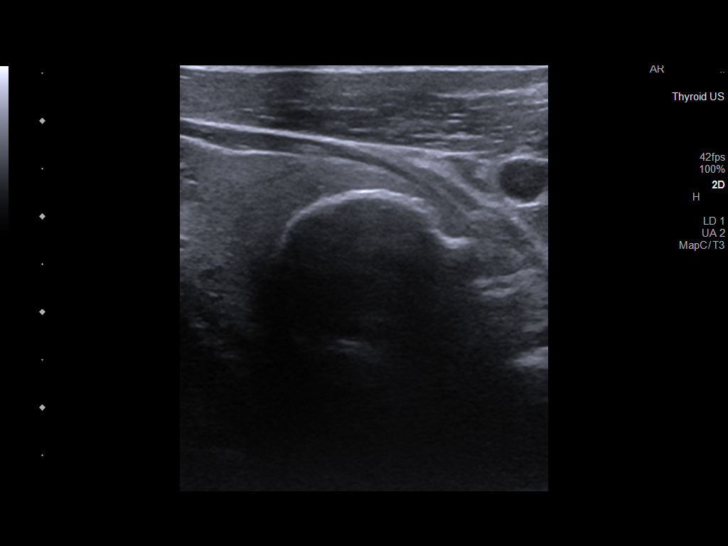
[im 3/24]
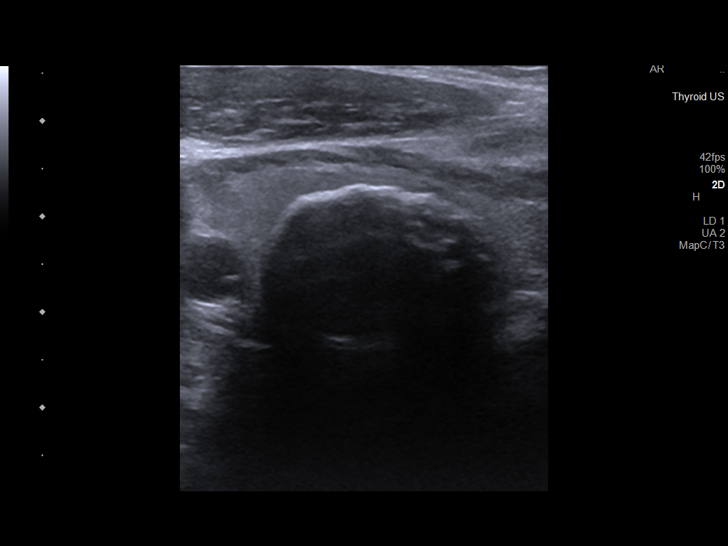
[im 5/24]
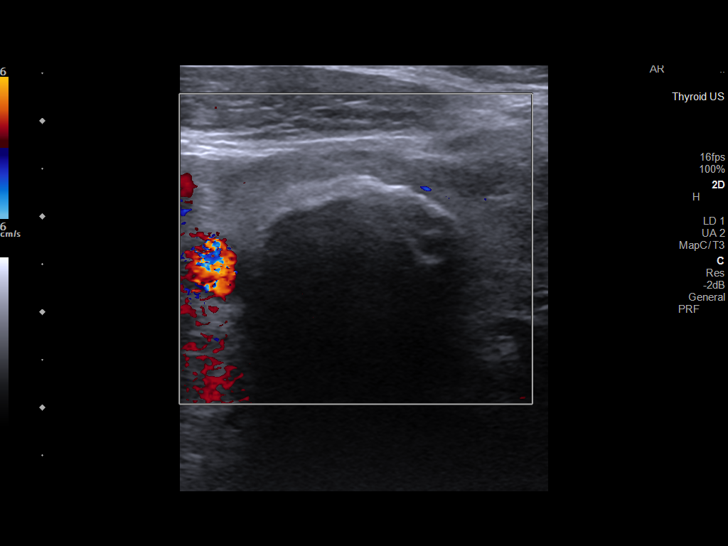
[im 7/24]
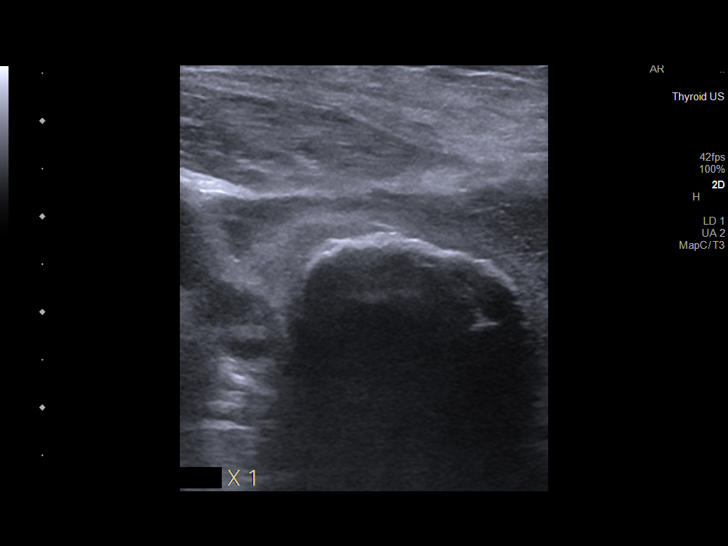
[im 9/24]
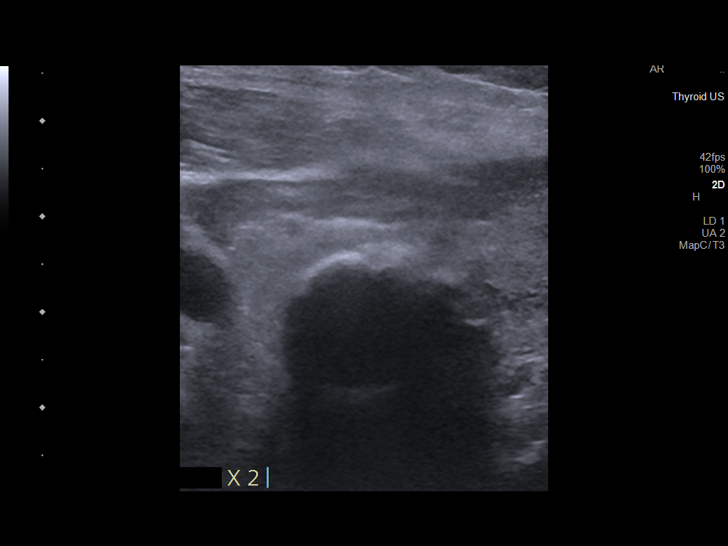
[im 11/24]
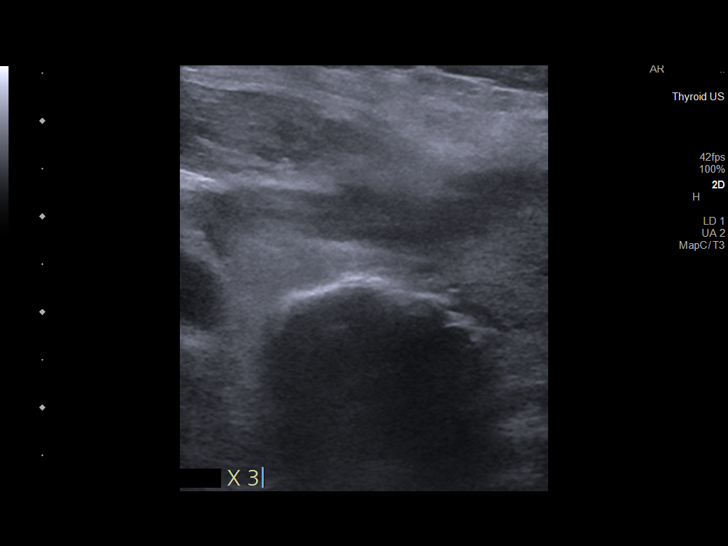
[im 13/24]
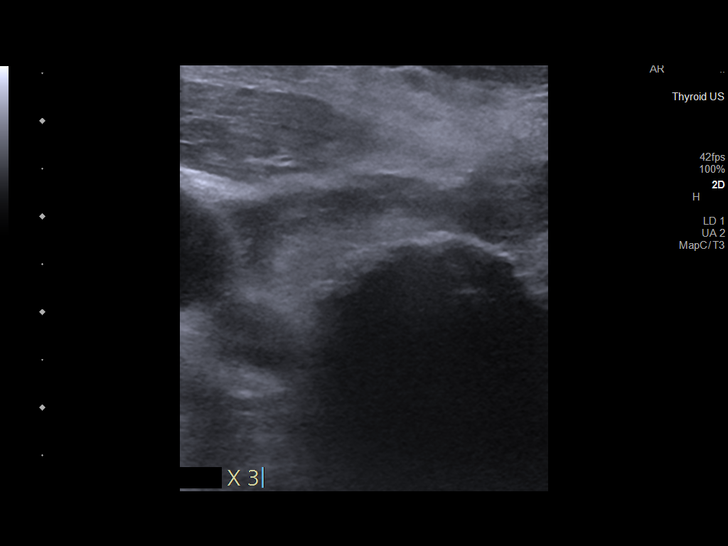
[im 14/24]
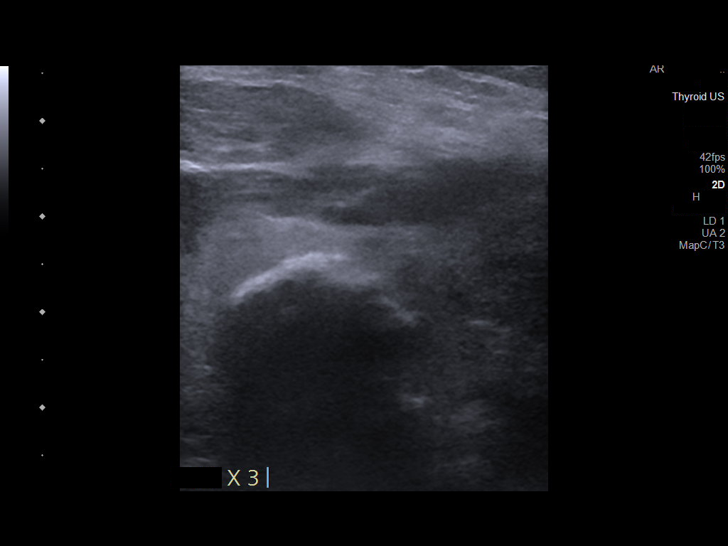
[im 16/24]
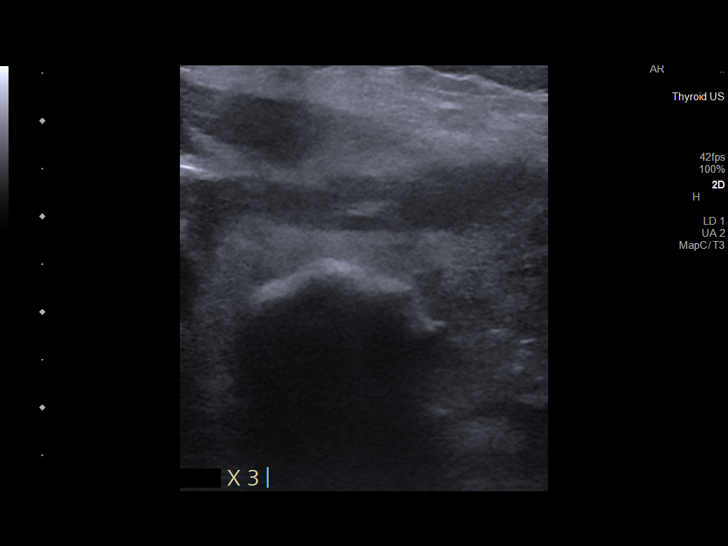
[im 18/24]
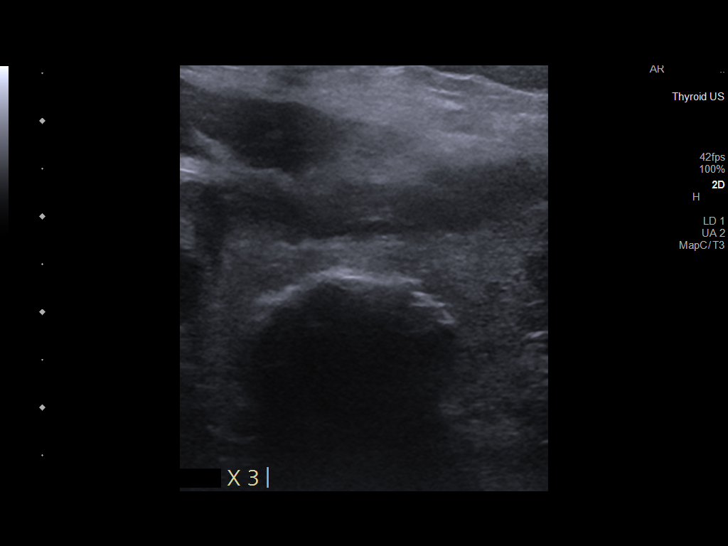
[im 20/24]
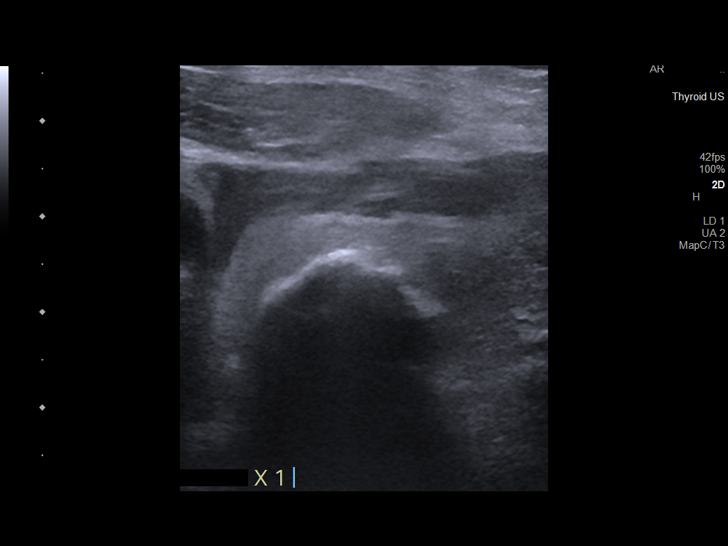
[im 22/24]
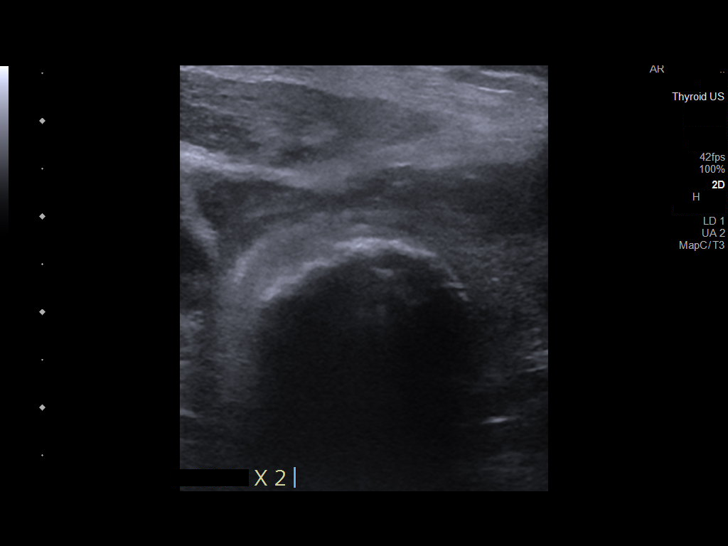
[im 24/24]
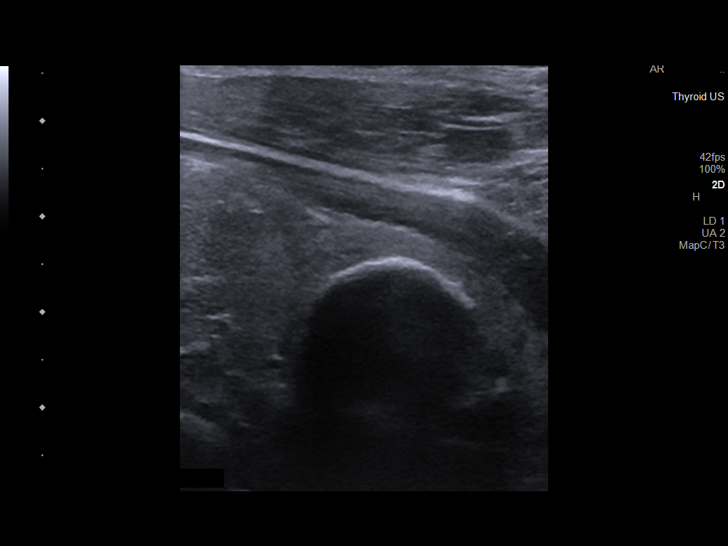

[13 of 24 positions shown; findings below may reference images not displayed]

Pre-procedural ultrasound scanning demonstrated unchanged size and
appearance of the indeterminate nodule within the right thyroid

The procedure was planned. The neck was prepped in the usual sterile
fashion, and a sterile drape was applied covering the operative
field. A timeout was performed prior to the initiation of the
procedure. Local anesthesia was provided with 1% lidocaine.

Under direct ultrasound guidance, 5 FNA biopsies were performed of
the right inferior thyroid nodule with a 25 gauge needle.

2 of these samples were obtained for AFIRMA

Multiple ultrasound images were saved for procedural documentation
purposes. The samples were prepared and submitted to pathology.

Limited post procedural scanning was negative for hematoma or
additional complication. Dressings were placed. The patient
tolerated the above procedures procedure well without immediate
postprocedural complication.
FINDINGS: Nodule reference number based on prior diagnostic ultrasound: 3

Maximum size: 2.5 cm

Location: Right; Inferior

ACR TI-RADS risk category: TR4 (4-6 points)

Reason for biopsy: meets ACR TI-RADS criteria

Ultrasound imaging confirms appropriate placement of the needles
within the thyroid nodule.

Difficult biopsy secondary calcified rim around most of superior
border. Was able to penetrate calcified rim but difficult and depth
of sample was not ideal.
IMPRESSION: Technically successful ultrasound guided fine needle aspiration of
right inferior thyroid nodule

Read by

Saloume Seljeflot

## 2023-12-23 IMAGING — CT CT CHEST-ABD-PELV W/ CM
2 of 5 series · 13 of 46 positions shown, 15 images · IV contrast (APPLIED)
Comparison: Esophagram images dated 01/27/2022 showing a GE
junction low-grade stricture obstructing 12.5 mm barium tablet.
There is no prior CT for comparison.

CLINICAL DATA: Esophageal cancer, staging CT. Esophageal mass
recently found from endoscopy. Began experiencing difficulty
swallowing 2 months ago than associated with pain.

EXAM:
CT CHEST, ABDOMEN, AND PELVIS WITH CONTRAST
TECHNIQUE: Multidetector CT imaging of the chest, abdomen and pelvis was
performed following the standard protocol during bolus
administration of intravenous contrast.

[Series 2: cap with · axial · 0.96mm/px · z∈[+809,+1389]mm · 10 of 138 slices shown, 12 images]
[im 11/138  soft-tissue]
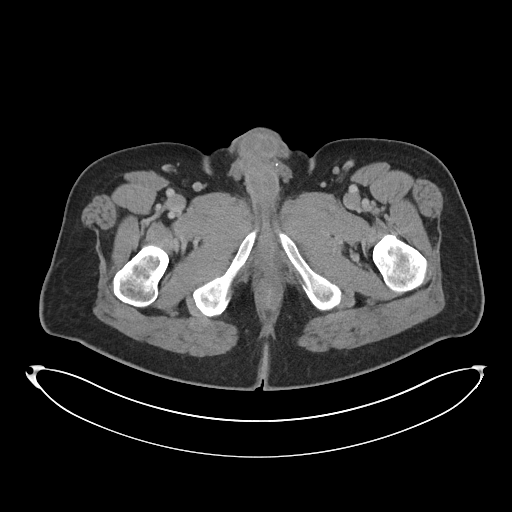
[im 11/138  bone]
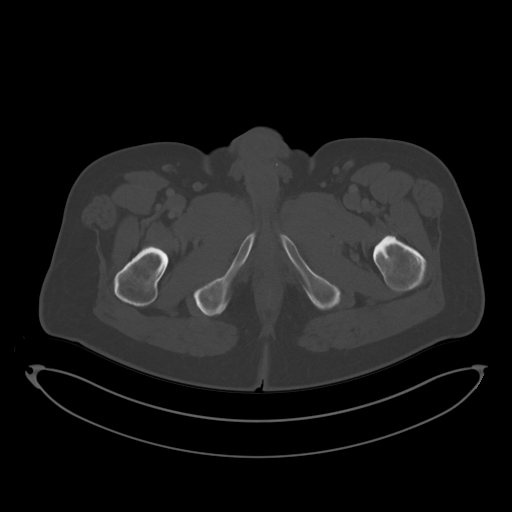
[im 22/138  soft-tissue]
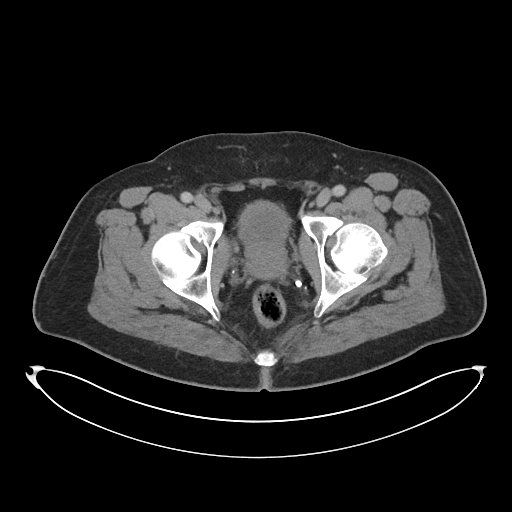
[im 43/138  soft-tissue]
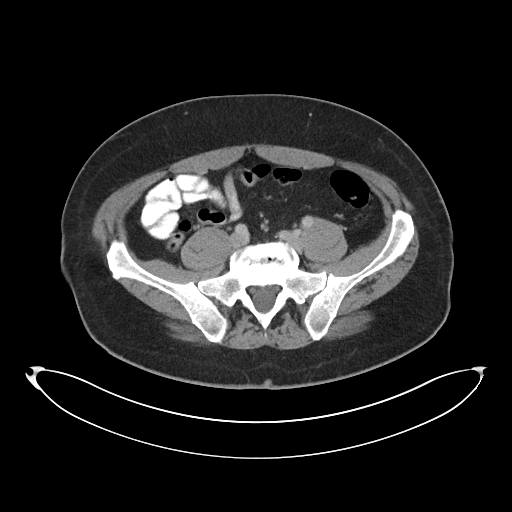
[im 53/138  soft-tissue]
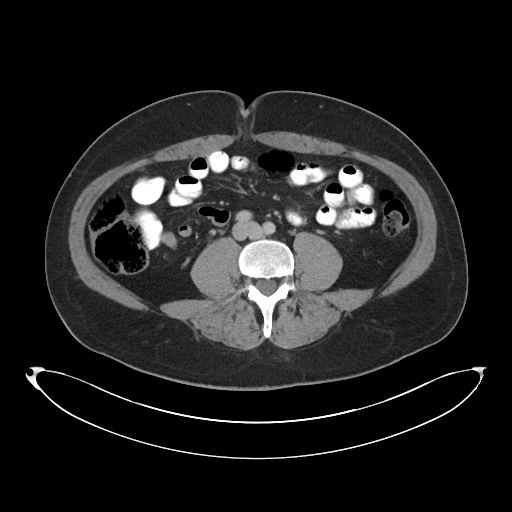
[im 64/138  soft-tissue]
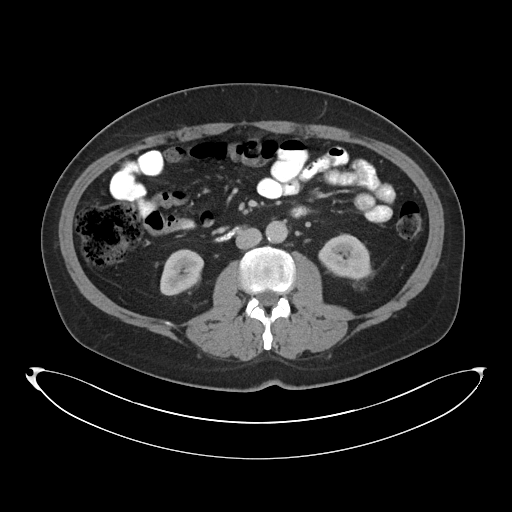
[im 74/138  soft-tissue]
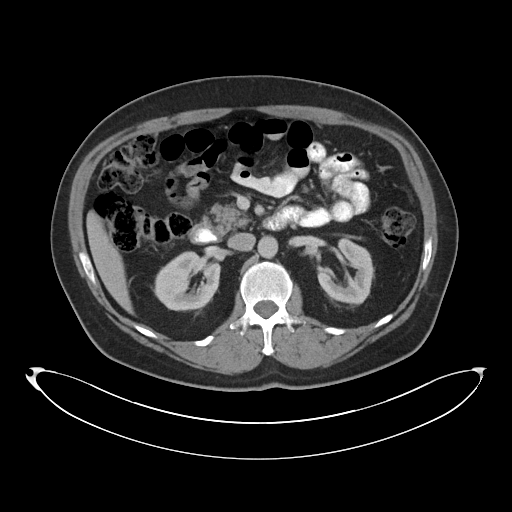
[im 85/138  soft-tissue]
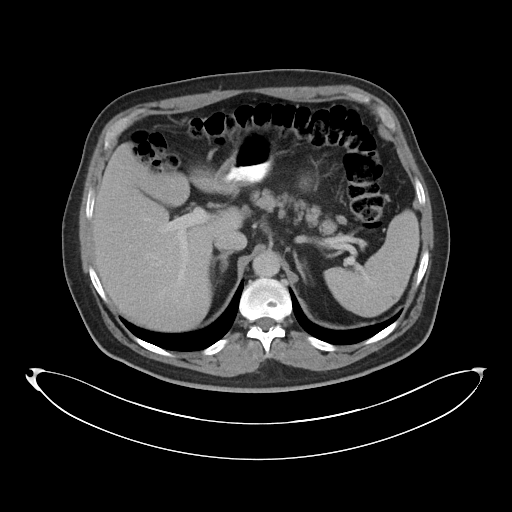
[im 106/138  soft-tissue]
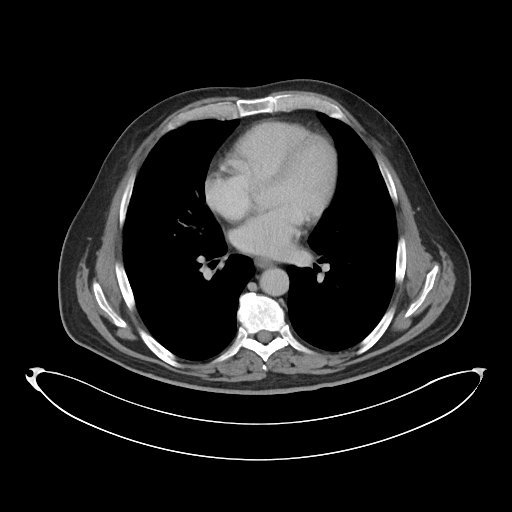
[im 116/138  soft-tissue]
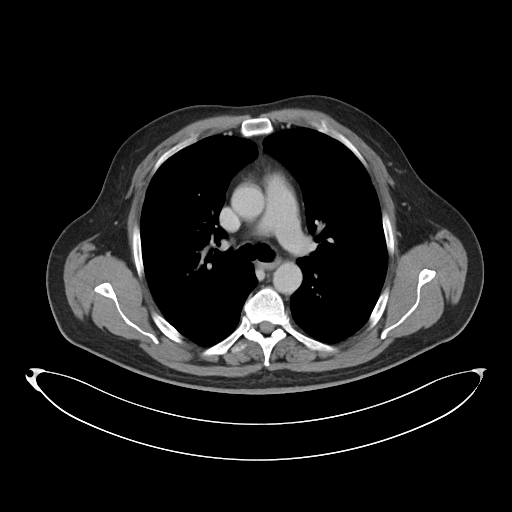
[im 116/138  bone]
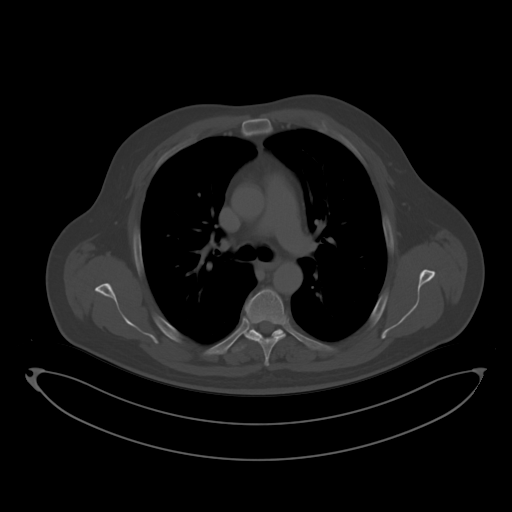
[im 127/138  soft-tissue]
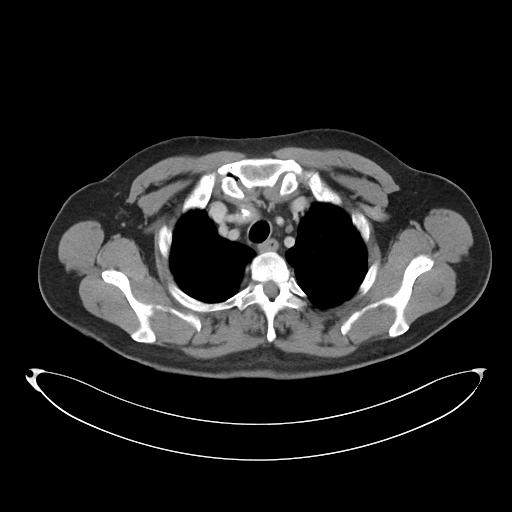

[Series 5: coronal · coronal · 0.94mm/px · 3 of 112 slices shown]
[im 38/112  soft-tissue]
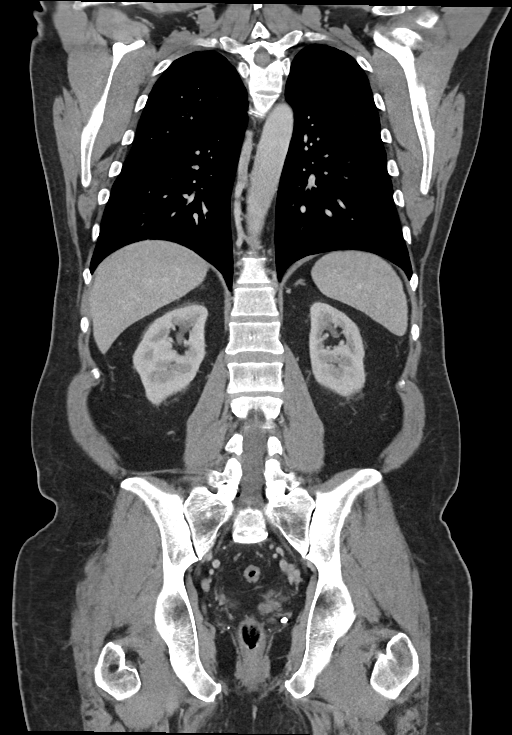
[im 50/112  soft-tissue]
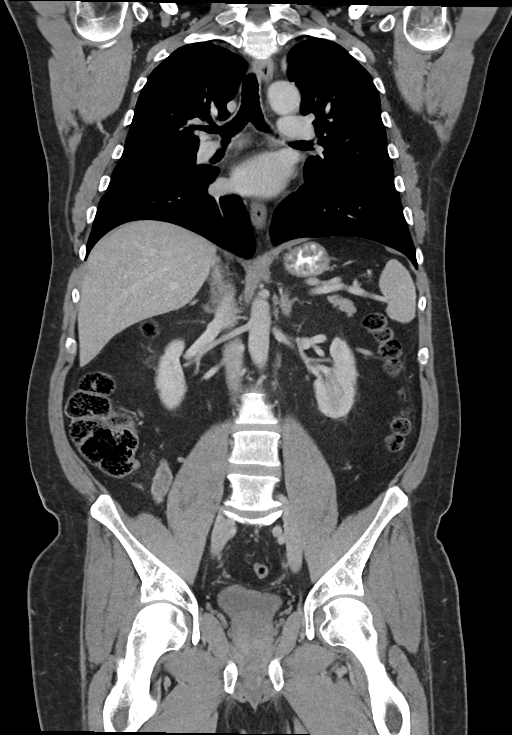
[im 62/112  soft-tissue]
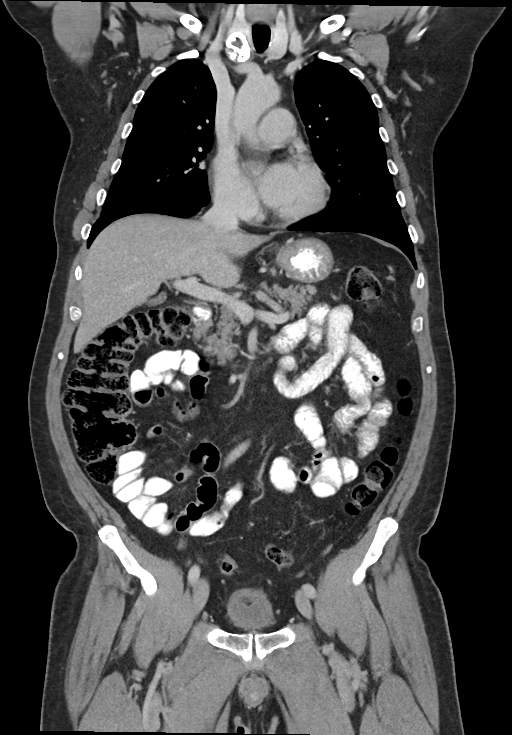

[13 of 46 positions shown; findings below may reference images not displayed]

RADIATION DOSE REDUCTION: This exam was performed according to the
departmental dose-optimization program which includes automated
exposure control, adjustment of the mA and/or kV according to
patient size and/or use of iterative reconstruction technique.

CONTRAST:  100mL OMNIPAQUE IOHEXOL 300 MG/ML  SOLN
FINDINGS: CT CHEST FINDINGS

Cardiovascular: The cardiac size is normal. There is trace
calcification in the proximal LAD coronary artery. There is no
pericardial effusion or venous distention. Pulmonary arteries are
normal caliber and centrally clear. There is homogeneous aortic and
great vessel enhancement with mild atherosclerosis in the aortic
arch.

Mediastinum/Nodes: 2.4 cm densely rim calcified nodule lower pole
right lobe of the thyroid gland, underwent FNA biopsy 02/17/2022
with unknown results. This was performed following a thyroid
ultrasound 02/09/2022.

There are no enlarged intrathoracic or axillary lymph nodes. There
are thickened folds at the EG junction but no focal circumscribable
mass. PET-CT may be helpful to further evaluate the finding. The
trachea and central airways are clear.

Lungs/Pleura: No pleural effusion, thickening or pneumothorax. There
are few linear scar-like opacities in the lung bases. There is no
infiltrate or spiculated nodule. Posteriorly in the right upper lobe
at the level of the aortic arch there is a 5 mm noncalcified
pleural-based nodule on [DATE]. The lungs are otherwise clear.

Musculoskeletal: There is a mild thoracic kyphodextroscoliosis and
multilevel mild wedging in the mid to lower thoracic spine with a
chronic appearance. There is spondylosis. No worrisome regional bone
lesion.

CT ABDOMEN PELVIS FINDINGS

Hepatobiliary: The liver is mildly steatotic. There is no mass
enhancement. The gallbladder and bile ducts are unremarkable.

Pancreas: No focal abnormality or ductal dilatation.

Spleen: No focal abnormality or splenomegaly.

Adrenals/Urinary Tract: There is no adrenal or renal mass
enhancement. There is a subcentimeter too small to characterize
hypodensity in the inferior pole of the left kidney which is
probably a cyst. There is no urinary stone or obstruction. The
bladder is contracted and not well seen although could be thickened.

Stomach/Bowel: The gastric wall unremarkable below the EG junction.
The bowel demonstrates no wall thickening or dilatation. The
appendix is normal. There is moderate stool retention ascending and
transverse colon. Left-sided diverticula without diverticulitis.

Vascular/Lymphatic: No significant vascular findings are present. No
enlarged abdominal or pelvic lymph nodes.

Reproductive: Mildly enlarged prostate with impression on the
inferior bladder.

Other: Multiple pelvic phleboliths. Small umbilical fat hernia.
There is no incarcerated hernia. There is no free air, hemorrhage or
fluid.

Musculoskeletal: There are degenerative changes of the lumbar spine.
This includes with advanced disc collapse at L5-S1 foraminal
encroachment. There is no regional destructive bone lesion or other
suspicious findings.
IMPRESSION: 1. There are thickened folds of the EG junction without discrete
focal mass visible. PET-CT may be helpful for further evaluation.
2. There is no upstream esophageal dilatation or thickening.
3. No intrathoracic axillary or abdominal adenopathy.
4. 5 mm right upper lobe solitary nodule. Attention on oncologic
follow-up imaging recommended.
5. 2.4 cm densely rim calcified right thyroid nodule, recently
underwent FNA biopsy with unknown results.
6. Mild hepatic steatosis.
7. Constipation and diverticulosis.
8. Aortic and coronary artery atherosclerosis.
9. Solitary subcentimeter too small to characterize left renal
hypodensity. Probable cyst.
10. Prostatomegaly with bladder thickening which could be due to
nondistention, cystitis or hypertrophy.
11. Remaining findings described above.

## 2023-12-31 IMAGING — PT NM PET TUM IMG INITIAL (PI) SKULL BASE T - THIGH
1 series · 3 of 3 positions shown · non-contrast
Comparison: CT of the chest abdomen pelvis from March 30, 2022.

CLINICAL DATA: Initial treatment strategy for gastric cancer.

EXAM:
NUCLEAR MEDICINE PET SKULL BASE TO THIGH
TECHNIQUE: 10.87 mCi F-18 FDG was injected intravenously. Full-ring PET imaging
was performed from the skull base to thigh after the radiotracer. CT
data was obtained and used for attenuation correction and anatomic
localization.
Fasting blood glucose: 96 mg/dl

[Series 1036: results mm oncology reading · 1.0mm · 1.00mm/px · 3 of 3 slices shown]
[im 1/3]
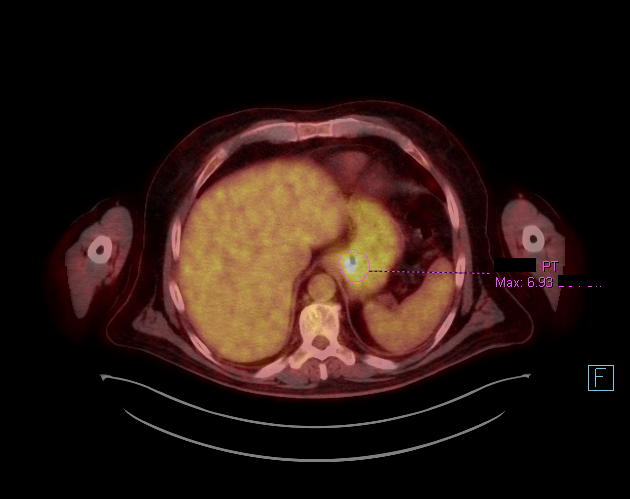
[im 2/3]
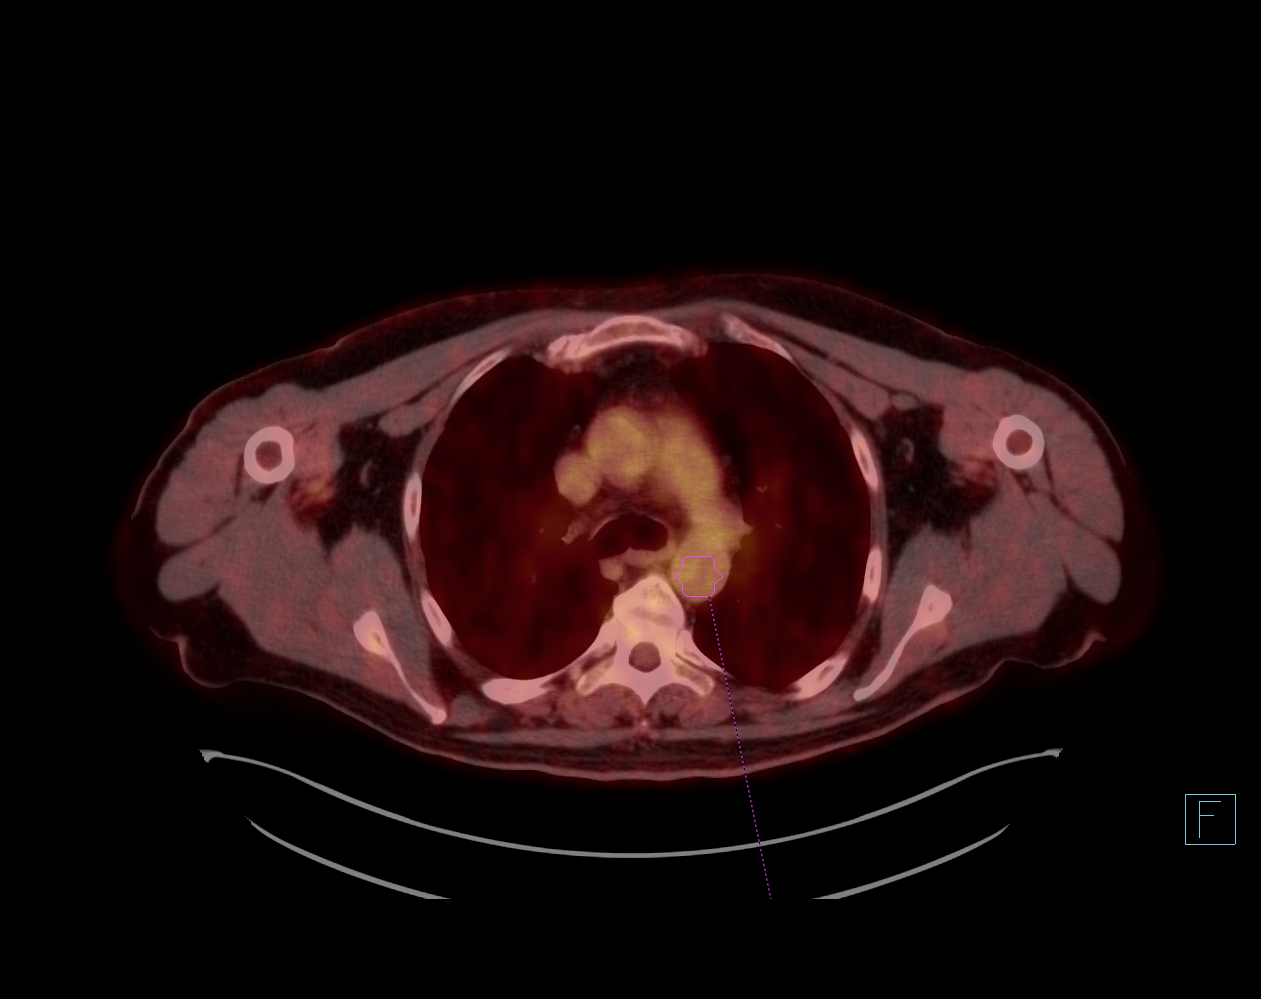
[im 3/3]
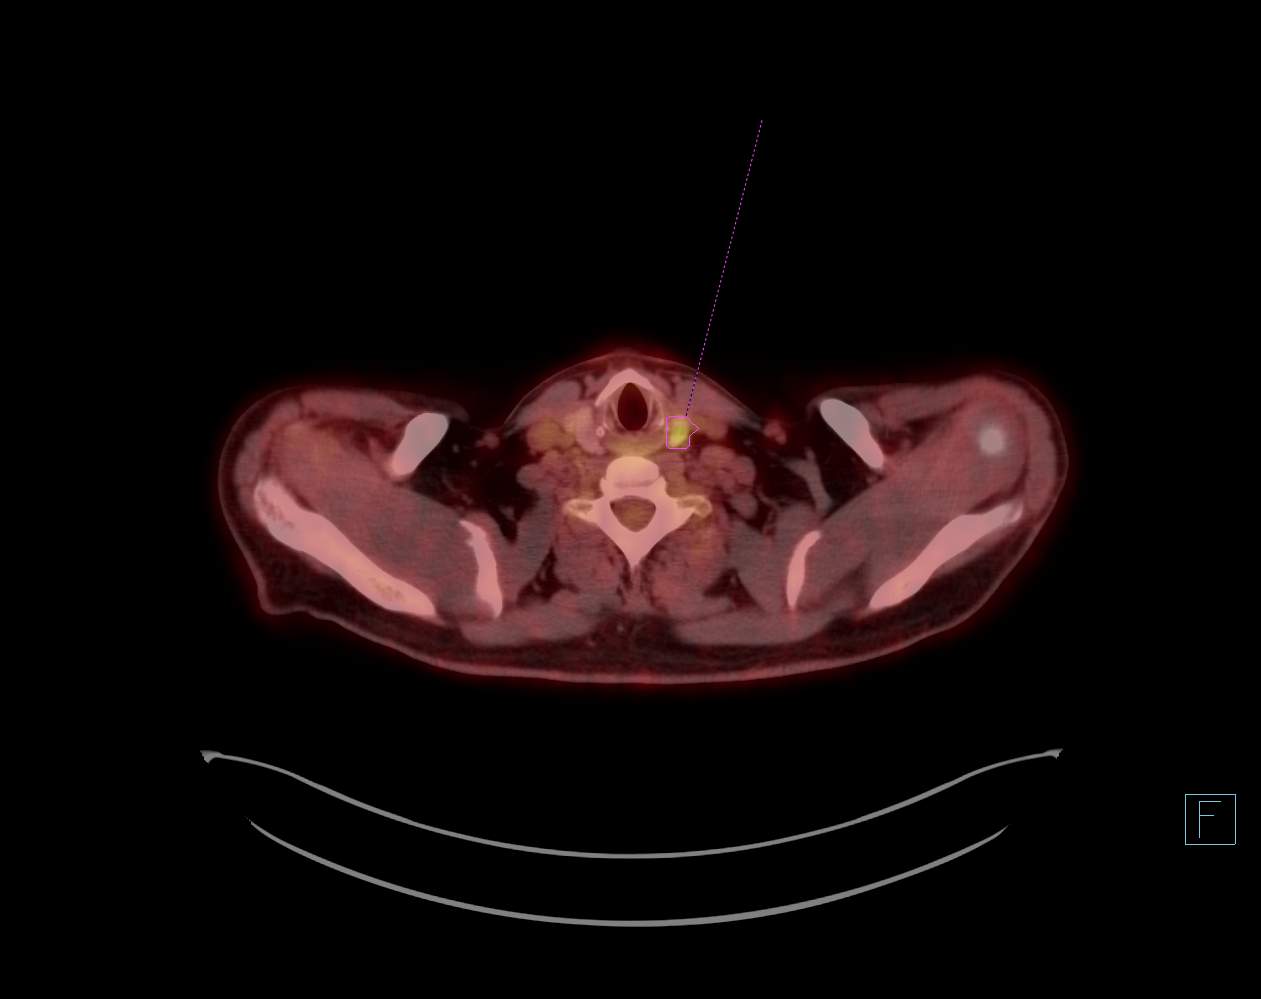

[3 of 3 positions shown; findings below may reference images not displayed]

FINDINGS: Mediastinal blood pool activity: SUV max

Liver activity: SUV max NA

NECK: No hypermetabolic lymph nodes in the neck. Focal area of
increased metabolic activity along the LEFT superior thyroid lobe
4.07 maximum SUV. Small nodule in this area on prior CT measuring 9
mm (image 64/3) calcified nodule on the RIGHT without increased
metabolic activity

Incidental CT findings: None.

CHEST: Hypermetabolic area at the GE junction (image 131/3) no
discrete mass lesion, signs of thickening in this area with maximum
SUV 6.93. No signs of adenopathy or suspicious pulmonary nodule.

Incidental CT findings: Calcified aortic atherosclerosis is mild.
Heart size mildly enlarged without pericardial effusion or
nodularity. No adenopathy by size criteria in the chest. Lungs are
clear. Airways are patent.

ABDOMEN/PELVIS: No abnormal hypermetabolic activity within the
liver, pancreas, adrenal glands, or spleen. No hypermetabolic lymph
nodes in the abdomen or pelvis.

Incidental CT findings: No adenopathy by size criteria in the
abdomen. No acute findings relative to liver, gallbladder, pancreas,
spleen, adrenal glands or kidneys. Urinary bladder decompressed
without signs of stranding or gross thickening. No acute
gastrointestinal process. Appendix is normal.

SKELETON: No focal hypermetabolic activity to suggest skeletal
metastasis.

Incidental CT findings: none
IMPRESSION: Increased metabolic activity about the GE junction likely the site
of the patient's primary tumor. No signs of metastatic disease to
the neck, chest, abdomen or pelvis.

LEFT thyroid lesion with moderately increased metabolic activity.
Recommend thyroid US and biopsy (ref: [HOSPITAL]. 1285

## 2024-07-02 ENCOUNTER — Other Ambulatory Visit (HOSPITAL_COMMUNITY): Payer: Self-pay
